# Patient Record
Sex: Male | Born: 1955 | Race: White | Hispanic: No | Marital: Married | Smoking: Former smoker
Health system: Southern US, Community
[De-identification: ages and names within clinical notes are randomized; demographics above are authoritative.]

## PROBLEM LIST (undated history)

## (undated) DIAGNOSIS — E785 Hyperlipidemia, unspecified: Secondary | ICD-10-CM

## (undated) DIAGNOSIS — I444 Left anterior fascicular block: Secondary | ICD-10-CM

## (undated) DIAGNOSIS — I452 Bifascicular block: Secondary | ICD-10-CM

## (undated) DIAGNOSIS — H269 Unspecified cataract: Secondary | ICD-10-CM

## (undated) DIAGNOSIS — E669 Obesity, unspecified: Secondary | ICD-10-CM

## (undated) DIAGNOSIS — I453 Trifascicular block: Secondary | ICD-10-CM

## (undated) DIAGNOSIS — G8929 Other chronic pain: Secondary | ICD-10-CM

## (undated) DIAGNOSIS — Z860100 Personal history of colon polyps, unspecified: Secondary | ICD-10-CM

## (undated) DIAGNOSIS — Z87892 Personal history of anaphylaxis: Secondary | ICD-10-CM

## (undated) DIAGNOSIS — Z96649 Presence of unspecified artificial hip joint: Secondary | ICD-10-CM

## (undated) DIAGNOSIS — I10 Essential (primary) hypertension: Secondary | ICD-10-CM

## (undated) DIAGNOSIS — D369 Benign neoplasm, unspecified site: Secondary | ICD-10-CM

## (undated) DIAGNOSIS — K219 Gastro-esophageal reflux disease without esophagitis: Secondary | ICD-10-CM

## (undated) HISTORY — DX: Hyperlipidemia, unspecified: E78.5

## (undated) HISTORY — DX: Left anterior fascicular block: I44.4

## (undated) HISTORY — PX: HIP SURGERY: SHX245

## (undated) HISTORY — DX: Personal history of colonic polyps: Z86.010

## (undated) HISTORY — PX: HAND SURGERY: SHX662

## (undated) HISTORY — DX: Personal history of anaphylaxis: Z87.892

## (undated) HISTORY — DX: Obesity, unspecified: E66.9

## (undated) HISTORY — DX: Benign neoplasm, unspecified site: D36.9

## (undated) HISTORY — DX: Personal history of colon polyps, unspecified: Z86.0100

## (undated) HISTORY — DX: Essential (primary) hypertension: I10

## (undated) HISTORY — PX: APPENDECTOMY: SHX54

## (undated) HISTORY — PX: CERVICAL LAMINECTOMY: SHX94

## (undated) HISTORY — DX: Bifascicular block: I45.2

## (undated) HISTORY — DX: Other chronic pain: G89.29

## (undated) HISTORY — PX: COLONOSCOPY: SHX174

## (undated) HISTORY — PX: CERVICAL FUSION: SHX112

## (undated) HISTORY — DX: Unspecified cataract: H26.9

## (undated) HISTORY — DX: Gastro-esophageal reflux disease without esophagitis: K21.9

## (undated) HISTORY — DX: Trifascicular block: I45.3

---

## 1998-05-25 ENCOUNTER — Encounter: Payer: Self-pay | Admitting: Emergency Medicine

## 1998-05-25 ENCOUNTER — Emergency Department (HOSPITAL_COMMUNITY): Admission: EM | Admit: 1998-05-25 | Discharge: 1998-05-25 | Payer: Self-pay | Admitting: Emergency Medicine

## 2001-10-15 ENCOUNTER — Encounter: Payer: Self-pay | Admitting: Gastroenterology

## 2001-10-24 ENCOUNTER — Encounter: Payer: Self-pay | Admitting: Gastroenterology

## 2001-10-25 ENCOUNTER — Encounter: Payer: Self-pay | Admitting: Gastroenterology

## 2003-04-08 ENCOUNTER — Ambulatory Visit (HOSPITAL_COMMUNITY): Admission: RE | Admit: 2003-04-08 | Discharge: 2003-04-08 | Payer: Self-pay | Admitting: Specialist

## 2003-05-28 ENCOUNTER — Ambulatory Visit (HOSPITAL_COMMUNITY): Admission: RE | Admit: 2003-05-28 | Discharge: 2003-05-28 | Payer: Self-pay | Admitting: Specialist

## 2003-06-17 ENCOUNTER — Ambulatory Visit (HOSPITAL_BASED_OUTPATIENT_CLINIC_OR_DEPARTMENT_OTHER): Admission: RE | Admit: 2003-06-17 | Discharge: 2003-06-17 | Payer: Self-pay | Admitting: Internal Medicine

## 2003-11-04 ENCOUNTER — Encounter: Admission: RE | Admit: 2003-11-04 | Discharge: 2003-11-04 | Payer: Self-pay | Admitting: Specialist

## 2003-11-26 ENCOUNTER — Ambulatory Visit: Payer: Self-pay | Admitting: Internal Medicine

## 2004-01-13 ENCOUNTER — Ambulatory Visit: Payer: Self-pay | Admitting: Internal Medicine

## 2004-04-01 ENCOUNTER — Encounter: Admission: RE | Admit: 2004-04-01 | Discharge: 2004-04-01 | Payer: Self-pay | Admitting: Specialist

## 2004-04-13 ENCOUNTER — Ambulatory Visit: Payer: Self-pay | Admitting: Internal Medicine

## 2004-08-17 ENCOUNTER — Ambulatory Visit: Payer: Self-pay | Admitting: Internal Medicine

## 2004-09-17 ENCOUNTER — Ambulatory Visit: Payer: Self-pay | Admitting: Internal Medicine

## 2004-10-21 ENCOUNTER — Ambulatory Visit: Payer: Self-pay | Admitting: Internal Medicine

## 2005-01-18 ENCOUNTER — Ambulatory Visit: Payer: Self-pay | Admitting: Internal Medicine

## 2005-05-19 ENCOUNTER — Encounter: Admission: RE | Admit: 2005-05-19 | Discharge: 2005-05-19 | Payer: Self-pay | Admitting: Specialist

## 2005-06-16 ENCOUNTER — Encounter: Admission: RE | Admit: 2005-06-16 | Discharge: 2005-06-16 | Payer: Self-pay | Admitting: Specialist

## 2005-06-23 ENCOUNTER — Ambulatory Visit: Payer: Self-pay | Admitting: Internal Medicine

## 2005-09-22 ENCOUNTER — Ambulatory Visit: Payer: Self-pay | Admitting: Internal Medicine

## 2005-12-15 ENCOUNTER — Ambulatory Visit: Payer: Self-pay | Admitting: Internal Medicine

## 2006-02-10 ENCOUNTER — Ambulatory Visit: Payer: Self-pay | Admitting: Internal Medicine

## 2006-05-24 ENCOUNTER — Ambulatory Visit: Payer: Self-pay | Admitting: Internal Medicine

## 2006-05-24 LAB — CONVERTED CEMR LAB
ALT: 32 units/L (ref 0–40)
AST: 21 units/L (ref 0–37)
Albumin: 4 g/dL (ref 3.5–5.2)
Alkaline Phosphatase: 61 units/L (ref 39–117)
BUN: 13 mg/dL (ref 6–23)
Basophils Absolute: 0 10*3/uL (ref 0.0–0.1)
Basophils Relative: 0.4 % (ref 0.0–1.0)
Bilirubin, Direct: 0.1 mg/dL (ref 0.0–0.3)
CO2: 29 meq/L (ref 19–32)
Calcium: 9.1 mg/dL (ref 8.4–10.5)
Chloride: 102 meq/L (ref 96–112)
Cholesterol: 195 mg/dL (ref 0–200)
Creatinine, Ser: 0.7 mg/dL (ref 0.4–1.5)
Creatinine,U: 125.6 mg/dL
Direct LDL: 76.5 mg/dL
Eosinophils Absolute: 0.2 10*3/uL (ref 0.0–0.6)
Eosinophils Relative: 3.2 % (ref 0.0–5.0)
GFR calc Af Amer: 152 mL/min
GFR calc non Af Amer: 126 mL/min
Glucose, Bld: 171 mg/dL — ABNORMAL HIGH (ref 70–99)
HCT: 47.7 % (ref 39.0–52.0)
HDL: 24.1 mg/dL — ABNORMAL LOW (ref >39.0)
Hemoglobin: 16.4 g/dL (ref 13.0–17.0)
Hgb A1c MFr Bld: 8 % — ABNORMAL HIGH (ref 4.6–6.0)
Lymphocytes Relative: 33.9 % (ref 12.0–46.0)
MCHC: 34.5 g/dL (ref 30.0–36.0)
MCV: 85.5 fL (ref 78.0–100.0)
Microalb Creat Ratio: 5.6 mg/g (ref 0.0–30.0)
Microalb, Ur: 0.7 mg/dL (ref 0.0–1.9)
Monocytes Absolute: 0.7 10*3/uL (ref 0.2–0.7)
Monocytes Relative: 9.5 % (ref 3.0–11.0)
Neutro Abs: 4.3 10*3/uL (ref 1.4–7.7)
Neutrophils Relative %: 53 % (ref 43.0–77.0)
PSA: 0.17 ng/mL (ref 0.10–4.00)
Platelets: 190 10*3/uL (ref 150–400)
Potassium: 3.9 meq/L (ref 3.5–5.1)
RBC: 5.58 M/uL (ref 4.22–5.81)
RDW: 13.5 % (ref 11.5–14.6)
Sodium: 137 meq/L (ref 135–145)
TSH: 2.3 microintl units/mL (ref 0.35–5.50)
Total Bilirubin: 0.9 mg/dL (ref 0.3–1.2)
Total CHOL/HDL Ratio: 8.1
Total Protein: 7.1 g/dL (ref 6.0–8.3)
Triglycerides: 638 mg/dL (ref 0–149)
VLDL: 128 mg/dL — ABNORMAL HIGH (ref 0–40)
WBC: 7.8 10*3/uL (ref 4.5–10.5)

## 2006-05-31 ENCOUNTER — Ambulatory Visit: Payer: Self-pay | Admitting: Internal Medicine

## 2006-06-19 ENCOUNTER — Ambulatory Visit: Payer: Self-pay | Admitting: Gastroenterology

## 2006-07-03 ENCOUNTER — Encounter: Payer: Self-pay | Admitting: Internal Medicine

## 2006-07-03 ENCOUNTER — Ambulatory Visit: Payer: Self-pay | Admitting: Gastroenterology

## 2006-07-03 LAB — HM COLONOSCOPY

## 2006-07-24 ENCOUNTER — Encounter: Admission: RE | Admit: 2006-07-24 | Discharge: 2006-07-24 | Payer: Self-pay | Admitting: Specialist

## 2006-07-24 ENCOUNTER — Encounter: Payer: Self-pay | Admitting: Internal Medicine

## 2006-08-02 ENCOUNTER — Encounter: Payer: Self-pay | Admitting: Internal Medicine

## 2006-08-02 ENCOUNTER — Encounter (INDEPENDENT_AMBULATORY_CARE_PROVIDER_SITE_OTHER): Payer: Self-pay

## 2006-08-02 DIAGNOSIS — Z86010 Personal history of colonic polyps: Secondary | ICD-10-CM | POA: Insufficient documentation

## 2006-08-02 DIAGNOSIS — E785 Hyperlipidemia, unspecified: Secondary | ICD-10-CM | POA: Insufficient documentation

## 2006-08-02 DIAGNOSIS — I10 Essential (primary) hypertension: Secondary | ICD-10-CM | POA: Insufficient documentation

## 2006-08-02 DIAGNOSIS — E119 Type 2 diabetes mellitus without complications: Secondary | ICD-10-CM | POA: Insufficient documentation

## 2006-08-07 ENCOUNTER — Encounter: Admission: RE | Admit: 2006-08-07 | Discharge: 2006-08-07 | Payer: Self-pay | Admitting: Specialist

## 2006-08-24 ENCOUNTER — Ambulatory Visit: Payer: Self-pay | Admitting: Internal Medicine

## 2006-08-24 DIAGNOSIS — L723 Sebaceous cyst: Secondary | ICD-10-CM | POA: Insufficient documentation

## 2006-08-24 LAB — CONVERTED CEMR LAB: Hgb A1c MFr Bld: 7.2 % — ABNORMAL HIGH (ref 4.6–6.0)

## 2006-09-08 ENCOUNTER — Telehealth (INDEPENDENT_AMBULATORY_CARE_PROVIDER_SITE_OTHER): Payer: Self-pay

## 2006-09-12 ENCOUNTER — Ambulatory Visit: Payer: Self-pay | Admitting: Internal Medicine

## 2006-09-12 DIAGNOSIS — J329 Chronic sinusitis, unspecified: Secondary | ICD-10-CM | POA: Insufficient documentation

## 2006-09-13 ENCOUNTER — Telehealth: Payer: Self-pay | Admitting: Internal Medicine

## 2006-10-19 ENCOUNTER — Telehealth: Payer: Self-pay | Admitting: Internal Medicine

## 2006-11-24 ENCOUNTER — Ambulatory Visit: Payer: Self-pay | Admitting: Internal Medicine

## 2006-11-24 LAB — CONVERTED CEMR LAB: Hgb A1c MFr Bld: 6.7 % — ABNORMAL HIGH (ref 4.6–6.0)

## 2006-11-29 ENCOUNTER — Encounter: Admission: RE | Admit: 2006-11-29 | Discharge: 2006-11-29 | Payer: Self-pay | Admitting: Specialist

## 2007-03-08 ENCOUNTER — Encounter: Admission: RE | Admit: 2007-03-08 | Discharge: 2007-03-08 | Payer: Self-pay | Admitting: Specialist

## 2007-04-05 ENCOUNTER — Ambulatory Visit: Payer: Self-pay | Admitting: Internal Medicine

## 2007-06-26 ENCOUNTER — Ambulatory Visit: Payer: Self-pay | Admitting: Internal Medicine

## 2007-06-26 DIAGNOSIS — L03119 Cellulitis of unspecified part of limb: Secondary | ICD-10-CM

## 2007-06-26 DIAGNOSIS — L02419 Cutaneous abscess of limb, unspecified: Secondary | ICD-10-CM | POA: Insufficient documentation

## 2007-07-05 ENCOUNTER — Ambulatory Visit: Payer: Self-pay | Admitting: Internal Medicine

## 2007-07-05 LAB — CONVERTED CEMR LAB
ALT: 40 units/L (ref 0–53)
AST: 32 units/L (ref 0–37)
Albumin: 3.8 g/dL (ref 3.5–5.2)
Alkaline Phosphatase: 53 units/L (ref 39–117)
BUN: 17 mg/dL (ref 6–23)
Basophils Absolute: 0 10*3/uL (ref 0.0–0.1)
Basophils Relative: 0.3 % (ref 0.0–1.0)
Bilirubin, Direct: 0.1 mg/dL (ref 0.0–0.3)
CO2: 32 meq/L (ref 19–32)
Calcium: 9.3 mg/dL (ref 8.4–10.5)
Chloride: 101 meq/L (ref 96–112)
Cholesterol: 169 mg/dL (ref 0–200)
Creatinine, Ser: 0.7 mg/dL (ref 0.4–1.5)
Creatinine,U: 209.3 mg/dL
Direct LDL: 80.5 mg/dL
Eosinophils Absolute: 0.4 10*3/uL (ref 0.0–0.7)
Eosinophils Relative: 4.8 % (ref 0.0–5.0)
GFR calc Af Amer: 152 mL/min
GFR calc non Af Amer: 125 mL/min
Glucose, Bld: 156 mg/dL — ABNORMAL HIGH (ref 70–99)
Glucose, Urine, Semiquant: NEGATIVE
HCT: 47.5 % (ref 39.0–52.0)
HDL: 27.7 mg/dL — ABNORMAL LOW (ref >39.0)
Hemoglobin: 16.2 g/dL (ref 13.0–17.0)
Hgb A1c MFr Bld: 7.3 % — ABNORMAL HIGH (ref 4.6–6.0)
Lymphocytes Relative: 35 % (ref 12.0–46.0)
MCHC: 34.1 g/dL (ref 30.0–36.0)
MCV: 90.2 fL (ref 78.0–100.0)
Microalb Creat Ratio: 5.7 mg/g (ref 0.0–30.0)
Microalb, Ur: 1.2 mg/dL (ref 0.0–1.9)
Monocytes Absolute: 0.6 10*3/uL (ref 0.1–1.0)
Monocytes Relative: 8 % (ref 3.0–12.0)
Neutro Abs: 4.3 10*3/uL (ref 1.4–7.7)
Neutrophils Relative %: 51.9 % (ref 43.0–77.0)
Nitrite: NEGATIVE
PSA: 0.17 ng/mL (ref 0.10–4.00)
Platelets: 189 10*3/uL (ref 150–400)
Potassium: 4.4 meq/L (ref 3.5–5.1)
Protein, U semiquant: NEGATIVE
RBC: 5.27 M/uL (ref 4.22–5.81)
RDW: 12.6 % (ref 11.5–14.6)
Sodium: 141 meq/L (ref 135–145)
Specific Gravity, Urine: 1.015
TSH: 2.07 microintl units/mL (ref 0.35–5.50)
Total Bilirubin: 0.9 mg/dL (ref 0.3–1.2)
Total CHOL/HDL Ratio: 6.1
Total Protein: 6.9 g/dL (ref 6.0–8.3)
Triglycerides: 451 mg/dL (ref 0–149)
Urobilinogen, UA: 1
VLDL: 90 mg/dL — ABNORMAL HIGH (ref 0–40)
WBC Urine, dipstick: NEGATIVE
WBC: 8.1 10*3/uL (ref 4.5–10.5)
pH: 8.5

## 2007-07-12 ENCOUNTER — Encounter: Payer: Self-pay | Admitting: Internal Medicine

## 2007-07-13 ENCOUNTER — Ambulatory Visit: Payer: Self-pay | Admitting: Internal Medicine

## 2007-07-13 DIAGNOSIS — K219 Gastro-esophageal reflux disease without esophagitis: Secondary | ICD-10-CM | POA: Insufficient documentation

## 2007-07-16 ENCOUNTER — Telehealth: Payer: Self-pay | Admitting: Internal Medicine

## 2007-08-01 ENCOUNTER — Encounter: Payer: Self-pay | Admitting: Internal Medicine

## 2007-08-28 ENCOUNTER — Ambulatory Visit: Payer: Self-pay | Admitting: Internal Medicine

## 2007-08-28 DIAGNOSIS — K5289 Other specified noninfective gastroenteritis and colitis: Secondary | ICD-10-CM | POA: Insufficient documentation

## 2007-10-09 ENCOUNTER — Encounter: Payer: Self-pay | Admitting: Internal Medicine

## 2007-10-15 ENCOUNTER — Ambulatory Visit: Payer: Self-pay | Admitting: Internal Medicine

## 2007-10-15 DIAGNOSIS — M1990 Unspecified osteoarthritis, unspecified site: Secondary | ICD-10-CM | POA: Insufficient documentation

## 2007-10-15 LAB — CONVERTED CEMR LAB: Hgb A1c MFr Bld: 7.3 % — ABNORMAL HIGH (ref 4.6–6.0)

## 2007-11-20 ENCOUNTER — Inpatient Hospital Stay (HOSPITAL_COMMUNITY): Admission: RE | Admit: 2007-11-20 | Discharge: 2007-11-23 | Payer: Self-pay | Admitting: Specialist

## 2008-01-04 HISTORY — PX: TOTAL KNEE ARTHROPLASTY: SHX125

## 2008-01-15 ENCOUNTER — Ambulatory Visit: Payer: Self-pay | Admitting: Internal Medicine

## 2008-01-15 LAB — CONVERTED CEMR LAB
ALT: 44 units/L (ref 0–53)
AST: 43 units/L — ABNORMAL HIGH (ref 0–37)
Albumin: 3.9 g/dL (ref 3.5–5.2)
Alkaline Phosphatase: 66 units/L (ref 39–117)
Bilirubin, Direct: 0.1 mg/dL (ref 0.0–0.3)
Hgb A1c MFr Bld: 6.8 % — ABNORMAL HIGH (ref 4.6–6.0)
Total Bilirubin: 0.9 mg/dL (ref 0.3–1.2)
Total Protein: 7.5 g/dL (ref 6.0–8.3)

## 2008-04-16 ENCOUNTER — Ambulatory Visit: Payer: Self-pay | Admitting: Internal Medicine

## 2008-04-17 LAB — CONVERTED CEMR LAB: Hgb A1c MFr Bld: 7.6 % — ABNORMAL HIGH (ref 4.6–6.5)

## 2008-07-22 ENCOUNTER — Telehealth: Payer: Self-pay | Admitting: Internal Medicine

## 2008-07-25 ENCOUNTER — Ambulatory Visit: Payer: Self-pay | Admitting: Internal Medicine

## 2008-07-28 ENCOUNTER — Telehealth: Payer: Self-pay | Admitting: Internal Medicine

## 2008-07-28 LAB — CONVERTED CEMR LAB
ALT: 35 units/L (ref 0–53)
AST: 38 units/L — ABNORMAL HIGH (ref 0–37)
Albumin: 4.1 g/dL (ref 3.5–5.2)
Alkaline Phosphatase: 58 units/L (ref 39–117)
BUN: 19 mg/dL (ref 6–23)
Basophils Absolute: 0 10*3/uL (ref 0.0–0.1)
Basophils Relative: 0.4 % (ref 0.0–3.0)
Bilirubin, Direct: 0 mg/dL (ref 0.0–0.3)
CO2: 27 meq/L (ref 19–32)
Calcium: 9.5 mg/dL (ref 8.4–10.5)
Chloride: 102 meq/L (ref 96–112)
Cholesterol: 177 mg/dL (ref 0–200)
Creatinine, Ser: 0.9 mg/dL (ref 0.4–1.5)
Eosinophils Absolute: 0.2 10*3/uL (ref 0.0–0.7)
Eosinophils Relative: 2.1 % (ref 0.0–5.0)
GFR calc non Af Amer: 93.15 mL/min (ref >60)
Glucose, Bld: 204 mg/dL — ABNORMAL HIGH (ref 70–99)
HCT: 48.5 % (ref 39.0–52.0)
Hemoglobin: 16.6 g/dL (ref 13.0–17.0)
Hgb A1c MFr Bld: 7.6 % — ABNORMAL HIGH (ref 4.6–6.5)
Lymphocytes Relative: 35.6 % (ref 12.0–46.0)
Lymphs Abs: 3.1 10*3/uL (ref 0.7–4.0)
MCHC: 34.2 g/dL (ref 30.0–36.0)
MCV: 87.4 fL (ref 78.0–100.0)
Monocytes Absolute: 0.6 10*3/uL (ref 0.1–1.0)
Monocytes Relative: 7 % (ref 3.0–12.0)
Neutro Abs: 4.7 10*3/uL (ref 1.4–7.7)
Neutrophils Relative %: 54.9 % (ref 43.0–77.0)
Platelets: 197 10*3/uL (ref 150.0–400.0)
Potassium: 4.1 meq/L (ref 3.5–5.1)
RBC: 5.55 M/uL (ref 4.22–5.81)
RDW: 13 % (ref 11.5–14.6)
Sodium: 138 meq/L (ref 135–145)
TSH: 2.11 microintl units/mL (ref 0.35–5.50)
Total Bilirubin: 1.2 mg/dL (ref 0.3–1.2)
Total Protein: 7.8 g/dL (ref 6.0–8.3)
WBC: 8.6 10*3/uL (ref 4.5–10.5)

## 2008-08-15 ENCOUNTER — Encounter: Admission: RE | Admit: 2008-08-15 | Discharge: 2008-08-15 | Payer: Self-pay | Admitting: Specialist

## 2008-08-16 ENCOUNTER — Encounter: Admission: RE | Admit: 2008-08-16 | Discharge: 2008-08-16 | Payer: Self-pay | Admitting: Specialist

## 2008-09-10 ENCOUNTER — Ambulatory Visit: Payer: Self-pay | Admitting: Internal Medicine

## 2008-09-10 LAB — CONVERTED CEMR LAB
BUN: 19 mg/dL (ref 6–23)
Basophils Absolute: 0.1 10*3/uL (ref 0.0–0.1)
Basophils Relative: 0.5 % (ref 0.0–3.0)
CO2: 27 meq/L (ref 19–32)
Calcium: 10.5 mg/dL (ref 8.4–10.5)
Chloride: 104 meq/L (ref 96–112)
Creatinine, Ser: 0.9 mg/dL (ref 0.4–1.5)
Eosinophils Absolute: 0.2 10*3/uL (ref 0.0–0.7)
Eosinophils Relative: 1.4 % (ref 0.0–5.0)
GFR calc non Af Amer: 93.1 mL/min (ref >60)
Glucose, Bld: 173 mg/dL — ABNORMAL HIGH (ref 70–99)
HCT: 50.9 % (ref 39.0–52.0)
Hemoglobin: 17.4 g/dL — ABNORMAL HIGH (ref 13.0–17.0)
Lymphocytes Relative: 32 % (ref 12.0–46.0)
Lymphs Abs: 3.5 10*3/uL (ref 0.7–4.0)
MCHC: 34.3 g/dL (ref 30.0–36.0)
MCV: 88.3 fL (ref 78.0–100.0)
Monocytes Absolute: 0.7 10*3/uL (ref 0.1–1.0)
Monocytes Relative: 6.4 % (ref 3.0–12.0)
Neutro Abs: 6.5 10*3/uL (ref 1.4–7.7)
Neutrophils Relative %: 59.7 % (ref 43.0–77.0)
Platelets: 203 10*3/uL (ref 150.0–400.0)
Potassium: 5.1 meq/L (ref 3.5–5.1)
RBC: 5.76 M/uL (ref 4.22–5.81)
RDW: 13.6 % (ref 11.5–14.6)
Sodium: 141 meq/L (ref 135–145)
WBC: 11 10*3/uL — ABNORMAL HIGH (ref 4.5–10.5)

## 2008-10-17 ENCOUNTER — Telehealth: Payer: Self-pay | Admitting: Internal Medicine

## 2008-11-24 ENCOUNTER — Telehealth: Payer: Self-pay | Admitting: Internal Medicine

## 2008-12-11 ENCOUNTER — Ambulatory Visit: Payer: Self-pay | Admitting: Internal Medicine

## 2008-12-16 LAB — CONVERTED CEMR LAB: Hgb A1c MFr Bld: 7.7 % — ABNORMAL HIGH (ref 4.6–6.5)

## 2009-02-06 ENCOUNTER — Telehealth: Payer: Self-pay | Admitting: Internal Medicine

## 2009-05-05 ENCOUNTER — Ambulatory Visit: Payer: Self-pay | Admitting: Internal Medicine

## 2009-05-05 LAB — CONVERTED CEMR LAB: Hgb A1c MFr Bld: 7.3 % — ABNORMAL HIGH (ref 4.6–6.5)

## 2009-06-26 ENCOUNTER — Telehealth: Payer: Self-pay | Admitting: Internal Medicine

## 2009-08-05 ENCOUNTER — Telehealth: Payer: Self-pay | Admitting: Internal Medicine

## 2009-08-13 ENCOUNTER — Ambulatory Visit: Payer: Self-pay | Admitting: Internal Medicine

## 2009-08-13 LAB — CONVERTED CEMR LAB
ALT: 31 units/L (ref 0–53)
AST: 26 units/L (ref 0–37)
Albumin: 4.1 g/dL (ref 3.5–5.2)
Alkaline Phosphatase: 52 units/L (ref 39–117)
BUN: 22 mg/dL (ref 6–23)
Basophils Absolute: 0 10*3/uL (ref 0.0–0.1)
Basophils Relative: 0.5 % (ref 0.0–3.0)
Bilirubin Urine: NEGATIVE
Bilirubin, Direct: 0.2 mg/dL (ref 0.0–0.3)
CO2: 28 meq/L (ref 19–32)
Calcium: 9.3 mg/dL (ref 8.4–10.5)
Chloride: 101 meq/L (ref 96–112)
Cholesterol: 203 mg/dL — ABNORMAL HIGH (ref 0–200)
Creatinine, Ser: 0.7 mg/dL (ref 0.4–1.5)
Creatinine,U: 149.7 mg/dL
Direct LDL: 104 mg/dL
Eosinophils Absolute: 0.3 10*3/uL (ref 0.0–0.7)
Eosinophils Relative: 3.1 % (ref 0.0–5.0)
GFR calc non Af Amer: 126.09 mL/min (ref >60)
Glucose, Bld: 146 mg/dL — ABNORMAL HIGH (ref 70–99)
Glucose, Urine, Semiquant: NEGATIVE
HCT: 46.4 % (ref 39.0–52.0)
HDL: 31.5 mg/dL — ABNORMAL LOW (ref >39.00)
Hemoglobin: 15.8 g/dL (ref 13.0–17.0)
Hgb A1c MFr Bld: 7.4 % — ABNORMAL HIGH (ref 4.6–6.5)
Ketones, urine, test strip: NEGATIVE
Lymphocytes Relative: 37 % (ref 12.0–46.0)
Lymphs Abs: 3.1 10*3/uL (ref 0.7–4.0)
MCHC: 34.1 g/dL (ref 30.0–36.0)
MCV: 90 fL (ref 78.0–100.0)
Microalb Creat Ratio: 0.5 mg/g (ref 0.0–30.0)
Microalb, Ur: 0.8 mg/dL (ref 0.0–1.9)
Monocytes Absolute: 0.7 10*3/uL (ref 0.1–1.0)
Monocytes Relative: 8.1 % (ref 3.0–12.0)
Neutro Abs: 4.3 10*3/uL (ref 1.4–7.7)
Neutrophils Relative %: 51.3 % (ref 43.0–77.0)
Nitrite: NEGATIVE
PSA: 0.18 ng/mL (ref 0.10–4.00)
Platelets: 179 10*3/uL (ref 150.0–400.0)
Potassium: 4.3 meq/L (ref 3.5–5.1)
Protein, U semiquant: NEGATIVE
RBC: 5.16 M/uL (ref 4.22–5.81)
RDW: 13.8 % (ref 11.5–14.6)
Sodium: 141 meq/L (ref 135–145)
Specific Gravity, Urine: 1.015
TSH: 1.47 microintl units/mL (ref 0.35–5.50)
Total Bilirubin: 0.7 mg/dL (ref 0.3–1.2)
Total CHOL/HDL Ratio: 6
Total Protein: 7 g/dL (ref 6.0–8.3)
Triglycerides: 485 mg/dL — ABNORMAL HIGH (ref 0.0–149.0)
Urobilinogen, UA: 0.2
VLDL: 97 mg/dL — ABNORMAL HIGH (ref 0.0–40.0)
WBC Urine, dipstick: NEGATIVE
WBC: 8.5 10*3/uL (ref 4.5–10.5)
pH: 7

## 2009-08-20 ENCOUNTER — Ambulatory Visit: Payer: Self-pay | Admitting: Internal Medicine

## 2009-08-20 LAB — HM DIABETES EYE EXAM: HM Diabetic Eye Exam: NORMAL

## 2009-10-26 ENCOUNTER — Telehealth: Payer: Self-pay | Admitting: Internal Medicine

## 2009-11-17 ENCOUNTER — Ambulatory Visit: Payer: Self-pay | Admitting: Internal Medicine

## 2009-11-17 LAB — CONVERTED CEMR LAB
Blood Glucose, Fingerstick: 256
Hgb A1c MFr Bld: 7.8 % — ABNORMAL HIGH (ref 4.6–6.5)

## 2009-11-17 LAB — HM DIABETES FOOT EXAM

## 2009-12-29 ENCOUNTER — Encounter (INDEPENDENT_AMBULATORY_CARE_PROVIDER_SITE_OTHER): Payer: Self-pay | Admitting: Specialist

## 2009-12-29 ENCOUNTER — Inpatient Hospital Stay (HOSPITAL_COMMUNITY)
Admission: RE | Admit: 2009-12-29 | Discharge: 2010-01-02 | Payer: Self-pay | Source: Home / Self Care | Attending: Specialist | Admitting: Specialist

## 2009-12-29 HISTORY — PX: HIP SURGERY: SHX245

## 2010-01-12 ENCOUNTER — Telehealth: Payer: Self-pay | Admitting: Internal Medicine

## 2010-02-02 NOTE — Procedures (Signed)
Summary: Colonoscopy Report/[facility]  Colonoscopy Report/[facility]   Imported By: Maryln Gottron 05/27/2009 14:16:34  _____________________________________________________________________  External Attachment:    Type:   Image     Comment:   External Document

## 2010-02-02 NOTE — Progress Notes (Signed)
  Phone Note Call from Patient   Caller: Patient Call For: Gordy Savers  MD Summary of Call: needs written rx for methadone 10mg  2-3 once daily - will pick up    [phone] Initial call taken by: Raechel Ache, RN,  February 06, 2009 11:48 AM    Prescriptions: METHADONE HCL 10 MG  TABS (METHADONE HCL) 1-4 by mouth daily  #90 x 0   Entered and Authorized by:   Gordy Savers  MD   Signed by:   Gordy Savers  MD on 02/06/2009   Method used:   Print then Give to Patient   RxID:   [id number]   Appended Document:  pt notified rx ready for p/u  KIK

## 2010-02-02 NOTE — Progress Notes (Signed)
Summary: methadone  Phone Note Call from Patient Call back at Work Phone [phone]   Caller: vm Summary of Call: Methadone Rx.  Will be out by Sat.  Will come by office to pickup.  Please call.   Initial call taken by: Rudy Jew, RN,  August 05, 2009 5:09 PM  Follow-up for Phone Call        pt aware - rx ready for pick up   KIK Follow-up by: Duard Brady LPN,  August 06, 2009 8:11 AM    Prescriptions: METHADONE HCL 10 MG  TABS (METHADONE HCL) 1-4 by mouth daily  #90 x 0   Entered by:   Gordy Savers  MD   Authorized by:   Duard Brady LPN   Signed by:   Gordy Savers  MD on 08/06/2009   Method used:   Print then Give to Patient   RxID:   [id number]   Appended Document: methadone [facility] called - needed ok - rx was prepared wrong. KIK

## 2010-02-02 NOTE — Progress Notes (Signed)
Summary: refill methadone  Phone Note Call from Patient Call back at Work Phone [phone]   Reason for Call: Refill Medication Summary of Call: Methadone written Rx.   Initial call taken by: Rudy Jew, RN,  October 26, 2009 11:14 AM  Follow-up for Phone Call        Phone Call Completed Follow-up by: Rudy Jew, RN,  October 26, 2009 3:35 PM    Prescriptions: METHADONE HCL 10 MG  TABS (METHADONE HCL) 1-4 by mouth daily  #90 x 0   Entered and Authorized by:   Gordy Savers  MD   Signed by:   Gordy Savers  MD on 10/26/2009   Method used:   Print then Give to Patient   RxID:   [id number]

## 2010-02-02 NOTE — Assessment & Plan Note (Signed)
Summary: 3 month follow up/cjr/pt rsc/cjr   Vital Signs:  Patient profile:   55 year old male Weight:      265 pounds Temp:     98.0 degrees F oral BP sitting:   120 / 80  (left arm) Cuff size:   large CC: 3 month F/U pt doing well  refills  BS-138 Is Patient Diabetic? Yes   CC:  3 month F/U pt doing well  refills  BS-138.  History of Present Illness: 55 year old patient who is in for follow up of his type 2 diabetes.  He states he has had 3 care at a recent cortisone injections to the right shoulder, one in the right hip.  He also states he has been off his Actos for about one month.  There's been a modest amount of weight loss over the past 4 months.  He has dyslipidemia and treated hypertension.  He has advanced osteoarthritis and low back pain.  He is followed by orthopedics closely.  Remains on Lipitor, which he continues to tolerate well.  Denies any muscle pain or weakness.  He also is a history of colonic polyps.  He's had a recent bout of viral dysentery that has resolved  Preventive Screening-Counseling & Management  Alcohol-Tobacco     Smoking Status: quit   Allergies: 1)  ! Codeine 2)  Aspirin (Aspirin) 3)  Naprosyn (Naproxen)  Past History:  Past Medical History: Reviewed history from 10/29/2008 and no changes required. 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 Foot surgery Appendectomy Cervical laminectomy x 2 Cervical fusion Hip surgery bilateral colonoscopy  2009 status post left total knee replacement surgery January 2010 stress test 2005  Family History: Reviewed history from 08/28/2007 and no changes required. 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  The patient denies anorexia, fever, weight loss, weight gain, vision loss, decreased hearing, hoarseness, chest pain, syncope, dyspnea on exertion, peripheral edema, prolonged cough, headaches, hemoptysis, abdominal pain, melena, hematochezia, severe indigestion/heartburn, hematuria, incontinence, genital sores, muscle weakness, suspicious skin lesions, transient blindness, depression, unusual weight change, abnormal bleeding, enlarged lymph nodes, angioedema, breast masses, and testicular masses.    Physical Exam  General:  overweight-appearing.  122/82overweight-appearing.   Head:  Normocephalic and atraumatic without obvious abnormalities. No apparent alopecia or balding. Eyes:  No corneal or conjunctival inflammation noted. EOMI. Perrla. Funduscopic exam benign, without hemorrhages, exudates or papilledema. Vision grossly normal. Mouth:  Oral mucosa and oropharynx without lesions or exudates.  Teeth in good repair. Neck:  No deformities, masses, or tenderness noted. Lungs:  Normal respiratory effort, chest expands symmetrically. Lungs are clear to auscultation, no crackles or wheezes. Heart:  Normal rate and regular rhythm. S1 and S2 normal without gallop, murmur, click, rub or other extra sounds. Abdomen:  obese soft and nontender Msk:  No deformity or scoliosis noted of thoracic or lumbar spine.   Pulses:  R and L carotid,radial,femoral,dorsalis pedis and posterior tibial pulses are full and equal bilaterally Extremities:  No  clubbing, cyanosis, edema, or deformity noted with normal full range of motion of all joints.   Skin:  Intact without suspicious lesions or rashes Cervical  Nodes:  No lymphadenopathy noted   Impression & Recommendations:  Problem # 1:  DEGENERATIVE JOINT DISEASE (ICD-715.90)  His updated medication list for this problem includes:    Methadone Hcl 10 Mg Tabs (Methadone hcl) .Marland Kitchen... 1-4 by mouth daily  His updated medication list for this problem includes:    Methadone Hcl 10 Mg Tabs (Methadone hcl) .Marland Kitchen... 1-4 by mouth daily  Problem # 2:  HYPERTENSION (ICD-401.9)  His updated medication list for this problem includes:    Lisinopril-hydrochlorothiazide 20-25 Mg Tabs (Lisinopril-hydrochlorothiazide) .Marland Kitchen... 1 once daily  His updated medication list for this problem includes:    Lisinopril-hydrochlorothiazide 20-25 Mg Tabs (Lisinopril-hydrochlorothiazide) .Marland Kitchen... 1 once daily  Problem # 3:  HYPERLIPIDEMIA (ICD-272.4)  His updated medication list for this problem includes:    Lipitor 40 Mg Tabs (Atorvastatin calcium) ..... One by mouth daily  His updated medication list for this problem includes:    Lipitor 40 Mg Tabs (Atorvastatin calcium) ..... One by mouth daily  Problem # 4:  DIABETES MELLITUS, TYPE II (ICD-250.00)  His updated medication list for this problem includes:    Lisinopril-hydrochlorothiazide 20-25 Mg Tabs (Lisinopril-hydrochlorothiazide) .Marland Kitchen... 1 once daily    Metformin Hcl 1000 Mg Tabs (Metformin hcl) ..... One bid    Actos 15 Mg Tabs (Pioglitazone hcl) .Marland Kitchen... 1 once daily  His updated medication list for this problem includes:    Lisinopril-hydrochlorothiazide 20-25 Mg Tabs (Lisinopril-hydrochlorothiazide) .Marland Kitchen... 1 once daily    Metformin Hcl 1000 Mg Tabs (Metformin hcl) ..... One bid    Actos 15 Mg Tabs (Pioglitazone hcl) .Marland Kitchen... 1 once daily  Orders: Venipuncture (13086) TLB-A1C / Hgb A1C (Glycohemoglobin) (83036-A1C)  Complete Medication List: 1)  Lipitor 40 Mg Tabs (Atorvastatin calcium) .... One by mouth daily 2)  Lisinopril-hydrochlorothiazide 20-25 Mg Tabs (Lisinopril-hydrochlorothiazide) .Marland Kitchen.. 1 once daily 3)   Metformin Hcl 1000 Mg Tabs (Metformin hcl) .... One bid 4)  Methocarbamol 750 Mg Tabs (Methocarbamol) .Marland Kitchen.. 1 q6h as needed 5)  Prilosec 20 Mg Cpdr (Omeprazole) .Marland Kitchen.. 1 once daily 6)  Methadone Hcl 10 Mg Tabs (Methadone hcl) .Marland Kitchen.. 1-4 by mouth daily 7)  Glimepiride 4 Mg Tabs (glimepiride)  .Marland Kitchen.. 1 once daily as needed 8)  Mupirocin 2 % Oint (Mupirocin) .... Uad 9)  Actos 15 Mg Tabs (Pioglitazone hcl) .Marland Kitchen.. 1 once daily 10)  Doxycycline Hyclate 100 Mg Tabs (Doxycycline hyclate) .... One twice daily 11)  Promethazine Hcl 25 Mg Tabs (Promethazine hcl) .... One every 6 hours as needed nausea  Patient Instructions: 1)  Please schedule a follow-up appointment in 3 months CPX 2)  Limit your Sodium (Salt). 3)  It is important that you exercise regularly at least 20 minutes 5 times a week. If you develop chest pain, have severe difficulty breathing, or feel very tired , stop exercising immediately and seek medical attention. 4)  You need to lose weight. Consider a lower calorie diet and regular exercise.  5)  Check your blood sugars regularly. If your readings are usually above : or below 70 you should contact our office. 6)  It is important that your Diabetic A1c level is checked every 3 months. 7)  See your eye doctor yearly to check for diabetic eye damage. Prescriptions: PROMETHAZINE HCL 25 MG TABS (PROMETHAZINE HCL) One every 6 hours as needed nausea  #30 x 0   Entered and  Authorized by:   Gordy Savers  MD   Signed by:   Gordy Savers  MD on 05/05/2009   Method used:   Print then Give to Patient   RxID:   [id number] ACTOS 15 MG TABS (PIOGLITAZONE HCL) 1 once daily  #90 x 6   Entered and Authorized by:   Gordy Savers  MD   Signed by:   Gordy Savers  MD on 05/05/2009   Method used:   Print then Give to Patient   RxID:   [id number] GLIMEPIRIDE 4 MG  TABS (GLIMEPIRIDE) 1 once daily as needed  #90 x 6   Entered and Authorized by:   Gordy Savers  MD    Signed by:   Gordy Savers  MD on 05/05/2009   Method used:   Print then Give to Patient   RxID:   [id number] METHADONE HCL 10 MG  TABS (METHADONE HCL) 1-4 by mouth daily  #90 x 0   Entered and Authorized by:   Gordy Savers  MD   Signed by:   Gordy Savers  MD on 05/05/2009   Method used:   Print then Give to Patient   RxID:   [id number] PRILOSEC 20 MG  CPDR (OMEPRAZOLE) 1 once daily  #90 x 6   Entered and Authorized by:   Gordy Savers  MD   Signed by:   Gordy Savers  MD on 05/05/2009   Method used:   Print then Give to Patient   RxID:   [id number] METHOCARBAMOL 750 MG  TABS (METHOCARBAMOL) 1 q6h as needed  #90 Tablet x 6   Entered and Authorized by:   Gordy Savers  MD   Signed by:   Gordy Savers  MD on 05/05/2009   Method used:   Print then Give to Patient   RxID:   [id number] METFORMIN HCL 1000 MG  TABS (METFORMIN HCL) one bid  #180 x 6   Entered and Authorized by:   Gordy Savers  MD   Signed by:   Gordy Savers  MD on 05/05/2009   Method used:   Print then Give to Patient   RxID:   [id number] LISINOPRIL-HYDROCHLOROTHIAZIDE 20-25 MG  TABS (LISINOPRIL-HYDROCHLOROTHIAZIDE) 1 once daily  #90 x 6   Entered and Authorized by:   Gordy Savers  MD   Signed by:   Gordy Savers  MD on 05/05/2009   Method used:   Print then Give to Patient   RxID:   [id number] LIPITOR 40 MG  TABS (ATORVASTATIN CALCIUM) one by mouth daily  #90 x 6   Entered and Authorized by:   Gordy Savers  MD   Signed by:   Gordy Savers  MD on 05/05/2009   Method used:   Print then Give to Patient   RxID:   [id number]

## 2010-02-02 NOTE — Assessment & Plan Note (Signed)
Summary: cpx/njrq   Vital Signs:  Patient profile:   55 year old male Height:      73 inches Weight:      267 pounds Temp:     98.2 degrees F oral BP sitting:   120 / 80  (left arm) Cuff size:   large  Vitals Entered By: Kathrynn Speed CMA (August 20, 2009 2:50 PM) CC: cpx, review lab, skin spots, spot on ear will not heal,left toenail ,src     bs after lunch 130 Is Patient Diabetic? Yes   CC:  cpx, review lab, skin spots, spot on ear will not heal, left toenail , and src     bs after lunch 130.  History of Present Illness: 55 year old patient who is seen today for a health maintenance examination.  Medical problems include osteoarthritis, and chronic pain.  He remains on methadone, which he takes one to 3 times daily.  He has type 2 diabetes, hypertension, and dyslipidemia.  Preventive Screening-Counseling & Management  Alcohol-Tobacco     Smoking Status: quit  Current Medications (verified): 1)  Lipitor 40 Mg  Tabs (Atorvastatin Calcium) .... One By Mouth Daily 2)  Lisinopril-Hydrochlorothiazide 20-25 Mg  Tabs (Lisinopril-Hydrochlorothiazide) .Marland Kitchen.. 1 Once Daily 3)  Metformin Hcl 1000 Mg  Tabs (Metformin Hcl) .... One Bid 4)  Methocarbamol 750 Mg  Tabs (Methocarbamol) .Marland Kitchen.. 1 Q6h As Needed 5)  Prilosec 20 Mg  Cpdr (Omeprazole) .Marland Kitchen.. 1 Once Daily 6)  Methadone Hcl 10 Mg  Tabs (Methadone Hcl) .Marland Kitchen.. 1-4 By Mouth Daily 7)  Glimepiride 4 Mg  Tabs (Glimepiride) .Marland Kitchen.. 1 Once Daily As Needed 8)  Mupirocin 2 %  Oint (Mupirocin) .... Uad 9)  Actos 15 Mg Tabs (Pioglitazone Hcl) .Marland Kitchen.. 1 Once Daily 10)  Promethazine Hcl 25 Mg Tabs (Promethazine Hcl) .... One Every 6 Hours As Needed Nausea  Allergies (verified): 1)  ! Codeine 2)  Aspirin (Aspirin) 3)  Naprosyn (Naproxen)  Past History:  Past Medical History: Reviewed history from 10/29/2008 and no changes required. Diabetes mellitus, type II Hyperlipidemia Hypertension Chronic pain Obesity Colonic polyps, hx of GERD trifascicular  block with first degree AV block, left anterior hemiblock, and right bundle branch block Adenomatous polyp 10/2001  Past Surgical History: Reviewed history from 09/10/2008 and no changes required. Foot surgery Appendectomy Cervical laminectomy x 2 Cervical fusion Hip surgery bilateral colonoscopy  2009 status post left total knee replacement surgery January 2010 stress test 2005  Family History: Reviewed history from 08/28/2007 and no changes required. 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    Physical Exam  General:  overweight-appearing.  120/80 Head:  Normocephalic and atraumatic without obvious abnormalities. No apparent alopecia or balding. Eyes:  No corneal or conjunctival inflammation noted. EOMI. Perrla. Funduscopic exam benign, without hemorrhages, exudates or papilledema. Vision grossly normal. Ears:  External ear exam shows no significant lesions or deformities.  Otoscopic examination reveals clear canals, tympanic membranes  are intact bilaterally without bulging, retraction, inflammation or discharge. Hearing is grossly normal bilaterally. Nose:  External nasal examination shows no  deformity or inflammation. Nasal mucosa are pink and moist without lesions or exudates. Mouth:  Oral mucosa and oropharynx without lesions or exudates.  Teeth in good repair. Neck:  No deformities, masses, or tenderness noted. Chest Wall:  No deformities, masses, tenderness or gynecomastia noted. Breasts:  No masses or gynecomastia noted Lungs:  Normal respiratory effort, chest expands symmetrically. Lungs are clear to auscultation, no crackles or wheezes. Heart:  Normal rate and regular rhythm. S1 and S2 normal without gallop, murmur, click, rub or other extra sounds. Abdomen:  Bowel sounds positive,abdomen soft and non-tender without masses, organomegaly or hernias noted. Rectal:  No external abnormalities noted. Normal sphincter tone. No rectal masses or tenderness. Genitalia:  Testes bilaterally descended without nodularity, tenderness or masses. No scrotal masses or lesions. No penis lesions or urethral discharge. Prostate:  1+ enlarged.   Msk:  No deformity or scoliosis noted of thoracic or lumbar spine.   Pulses:  R and L carotid,radial,femoral,dorsalis pedis and posterior tibial pulses are full and equal bilaterally Extremities:  No clubbing, cyanosis, edema, or deformity noted with normal full range of motion of all joints.   Neurologic:  No cranial nerve deficits noted. Station and gait are normal. Plantar reflexes are down-going bilaterally. DTRs are symmetrical throughout. Sensory, motor and coordinative functions appear intact.  Diabetes Management Exam:    Foot Exam (with socks and/or shoes not present):       Sensory-Pinprick/Light touch:          Left medial foot (L-4): normal          Left dorsal foot (L-5): normal          Left lateral foot (S-1): normal          Right medial foot (L-4): normal          Right dorsal foot (L-5): normal          Right lateral foot (S-1): normal       Sensory-Monofilament:          Left foot: diminished          Right foot: diminished        Inspection:          Left foot: normal          Right foot: normal       Nails:          Left foot: normal          Right foot: normal    Foot Exam by Podiatrist:       Date: 08/20/2009       Results: early diabetic findings       Done by: PCP    Eye Exam:       Eye Exam done here today          Results: normal   Impression & Recommendations:  Problem # 1:  PHYSICAL EXAMINATION (ICD-V70.0)  Complete Medication List: 1)  Lipitor 40 Mg Tabs (Atorvastatin calcium) .... One by mouth daily 2)  Lisinopril-hydrochlorothiazide 20-25 Mg Tabs (Lisinopril-hydrochlorothiazide) .Marland Kitchen.. 1 once daily 3)  Metformin Hcl 1000 Mg Tabs (Metformin hcl) .... One bid 4)  Methocarbamol 750 Mg Tabs (Methocarbamol) .Marland Kitchen.. 1 q6h as needed 5)  Prilosec 20 Mg Cpdr (Omeprazole) .Marland Kitchen.. 1 once daily 6)  Methadone Hcl 10 Mg Tabs (Methadone hcl) .Marland Kitchen.. 1-4 by mouth daily 7)  Glimepiride  4 Mg Tabs (glimepiride)  .Marland Kitchen.. 1 once daily as needed 8)  Mupirocin 2 % Oint (Mupirocin) .... Uad 9)  Actos 15 Mg Tabs (Pioglitazone hcl) .Marland Kitchen.. 1 once daily 10)  Promethazine Hcl 25 Mg Tabs (Promethazine hcl) .... One every 6 hours as needed nausea  Patient Instructions: 1)  Please schedule a follow-up appointment in 3 months. 2)  Limit your Sodium (Salt) to less than 2 grams a day(slightly less than 1/2 a teaspoon) to prevent fluid retention, swelling, or worsening of symptoms. 3)  It is important that you exercise regularly at least 20 minutes 5 times a week. If you develop chest pain, have severe difficulty breathing, or feel very tired , stop exercising immediately and seek medical attention. 4)  You need to lose weight. Consider a lower calorie diet and regular exercise.  5)  Check your blood sugars regularly. If your readings are usually above : or below 70 you should contact our office. 6)  It is important that your Diabetic A1c level is checked every 3 months. 7)  See your eye doctor yearly to check for diabetic eye damage.

## 2010-02-02 NOTE — Progress Notes (Signed)
Summary: REQ FOR REFILL RX  Phone Note Refill Request Message from:  Patient on June 26, 2009 8:24 AM  Refills Requested: Medication #1:  METFORMIN HCL 1000 MG  TABS one bid   Notes: [facility].  Medication #2:  ACTOS 15 MG TABS 1 once daily   Notes: [facility].  Patient adv that he is currently out of medication and would like to have Rx sent to [facility].Marland KitchenMarland KitchenMarland KitchenHe states that he looked on-line to see if Rx had been received by [facility] but it was showing that nothing has been received...Marland KitchenMarland KitchenMarland Kitchen Therefore, pt will need a Rx for at least 30 days sent to a local pharmacy - [facility] [place].      Prescriptions: METFORMIN HCL 1000 MG  TABS (METFORMIN HCL) one bid  #60 x 0   Entered by:   Kathrynn Speed CMA   Authorized by:   Gordy Savers  MD   Signed by:   Kathrynn Speed CMA on 06/26/2009   Method used:   Electronically to        [facility]  [place] [address] (retail[facility]       [address]       [place], [place]  [address]       Ph: [phone] or [phone]       Fax: [phone]   RxID:   [id number]

## 2010-02-02 NOTE — Assessment & Plan Note (Signed)
Summary: 3 mnth rov//slm   Vital Signs:  Patient profile:   55 year old male Weight:      270 pounds Temp:     98.0 degrees F oral BP sitting:   120 / 80  (right arm) Cuff size:   regular  Vitals Entered By: Duard Brady LPN (November 17, 2009 8:31 AM) CC: 3 mos rov - doing ok  Is Patient Diabetic? Yes Did you bring your meter with you today? No CBG Result 256 Flu Vaccine Consent Questions     Do you have a history of severe allergic reactions to this vaccine? no    Any prior history of allergic reactions to egg and/or gelatin? no    Do you have a sensitivity to the preservative Thimersol? no    Do you have a past history of Guillan-Barre Syndrome? no    Do you currently have an acute febrile illness? no    Have you ever had a severe reaction to latex? no    Vaccine information given and explained to patient? yes    Are you currently pregnant? no    Lot Number:[id number]   Exp Date:07/03/2010   Site Given  Left Deltoid IM   CC:  3 mos rov - doing ok .  History of Present Illness: 55 year old patient who is seen today for follow-up.  He has a history of type 2 diabetes  and has been on low-dose Actos.  he has had concerns due to the adverse publicity and wishes to discontinue Actos.  His last hemoglobin A1c was 7.4, and random sugar today 256.  He has hypertension and dyslipidemia.  There is been some weight gain since his last visit.  He has chronic pain.  He has requested a refill on his methadone.  Allergies: 1)  ! Codeine 2)  Aspirin (Aspirin) 3)  Naprosyn (Naproxen)  Past History:  Past Medical History: Reviewed history from 10/29/2008 and no changes required. 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 Foot surgery Appendectomy Cervical laminectomy x 2 Cervical fusion Hip surgery bilateral colonoscopy  2009 status post left total knee replacement surgery January 2010 stress test 2005  Family History: Reviewed history from 08/28/2007 and no changes required. father died age 70 history Crohn's disease, ischemic bowel, peripheral vascular occlusive disease  Mother history lung cancer  One brother- alcohol related problems  Review of Systems       The patient complains of weight gain.  The patient denies anorexia, fever, weight loss, vision loss, decreased hearing, hoarseness, chest pain, syncope, dyspnea on exertion, peripheral edema, prolonged cough, headaches, hemoptysis, abdominal pain, melena, hematochezia, severe indigestion/heartburn, hematuria, incontinence, genital sores, muscle weakness, suspicious skin lesions, transient blindness, difficulty walking, depression, unusual weight change, abnormal bleeding, enlarged lymph nodes, angioedema, breast masses, and testicular masses.    Physical Exam  General:  overweight-appearing.  normal blood pressureoverweight-appearing.   Head:  Normocephalic and atraumatic without obvious abnormalities. No apparent alopecia or balding. Eyes:  No corneal or conjunctival inflammation noted. EOMI. Perrla. Funduscopic exam benign, without hemorrhages, exudates or papilledema. Vision grossly normal. Mouth:  Oral mucosa and oropharynx without lesions or exudates.  Teeth in good repair. Neck:  No deformities, masses, or tenderness noted. Lungs:  Normal respiratory effort, chest expands symmetrically. Lungs are clear to  auscultation, no crackles or wheezes. Heart:  Normal rate and regular rhythm. S1 and S2 normal without gallop, murmur, click, rub or other extra sounds. Abdomen:  Bowel sounds positive,abdomen soft and non-tender without masses, organomegaly or hernias noted. Msk:  No deformity or scoliosis noted of thoracic or lumbar  spine.   Pulses:  R and L carotid,radial,femoral,dorsalis pedis and posterior tibial pulses are full and equal bilaterally Skin:  Intact without suspicious lesions or rashes Cervical Nodes:  No lymphadenopathy noted  Diabetes Management Exam:    Foot Exam (with socks and/or shoes not present):       Sensory-Pinprick/Light touch:          Left medial foot (L-4): normal          Left dorsal foot (L-5): normal          Left lateral foot (S-1): normal          Right medial foot (L-4): normal          Right dorsal foot (L-5): normal          Right lateral foot (S-1): normal       Sensory-Monofilament:          Left foot: normal          Right foot: normal       Inspection:          Left foot: normal          Right foot: normal       Nails:          Left foot: normal          Right foot: normal    Foot Exam by Podiatrist:       Date: 11/17/2009       Results: no diabetic findings       Done by: PCP   Impression & Recommendations:  Problem # 1:  DIABETES MELLITUS, TYPE II (ICD-250.00)  The following medications were removed from the medication list:    Actos 15 Mg Tabs (Pioglitazone hcl) .Marland Kitchen... 1 once daily His updated medication list for this problem includes:    Lisinopril-hydrochlorothiazide 20-25 Mg Tabs (Lisinopril-hydrochlorothiazide) .Marland Kitchen... 1 once daily    Metformin Hcl 1000 Mg Tabs (Metformin hcl) ..... One bid    Victoza 18 Mg/12ml Soln (Liraglutide) ..... Use daily  Orders: Capillary Blood Glucose/CBG (91478) Venipuncture (29562) TLB-A1C / Hgb A1C (Glycohemoglobin) (83036-A1C) Specimen Handling (13086)  The following medications were removed from the medication list:    Actos 15 Mg Tabs (Pioglitazone hcl) .Marland Kitchen... 1 once daily His updated medication list for this problem includes:    Lisinopril-hydrochlorothiazide 20-25 Mg Tabs (Lisinopril-hydrochlorothiazide) .Marland Kitchen... 1 once daily    Metformin Hcl 1000 Mg Tabs (Metformin hcl) ..... One bid    Victoza 18 Mg/70ml Soln  (Liraglutide) ..... Use daily  Problem # 2:  HYPERTENSION (ICD-401.9)  His updated medication list for this problem includes:    Lisinopril-hydrochlorothiazide 20-25 Mg Tabs (Lisinopril-hydrochlorothiazide) .Marland Kitchen... 1 once daily  His updated medication list for this problem includes:    Lisinopril-hydrochlorothiazide 20-25 Mg Tabs (Lisinopril-hydrochlorothiazide) .Marland Kitchen... 1 once daily  Problem # 3:  HYPERLIPIDEMIA (ICD-272.4)  His updated medication list for this problem includes:    Lipitor 40 Mg Tabs (Atorvastatin calcium) ..... One by mouth daily  His updated medication list for this problem includes:    Lipitor 40 Mg Tabs (Atorvastatin calcium) ..... One by mouth daily  Complete Medication List: 1)  Lipitor 40 Mg Tabs (Atorvastatin calcium) .Marland KitchenMarland KitchenMarland Kitchen  One by mouth daily 2)  Lisinopril-hydrochlorothiazide 20-25 Mg Tabs (Lisinopril-hydrochlorothiazide) .Marland Kitchen.. 1 once daily 3)  Metformin Hcl 1000 Mg Tabs (Metformin hcl) .... One bid 4)  Methocarbamol 750 Mg Tabs (Methocarbamol) .Marland Kitchen.. 1 q6h as needed 5)  Prilosec 20 Mg Cpdr (Omeprazole) .Marland Kitchen.. 1 once daily 6)  Methadone Hcl 10 Mg Tabs (Methadone hcl) .Marland Kitchen.. 1-4 by mouth daily 7)  Glimepiride 4 Mg Tabs (glimepiride)  .Marland Kitchen.. 1 once daily as needed 8)  Mupirocin 2 % Oint (Mupirocin) .... Uad 9)  Promethazine Hcl 25 Mg Tabs (Promethazine hcl) .... One every 6 hours as needed nausea 10)  Victoza 18 Mg/63ml Soln (Liraglutide) .... Use daily  Other Orders: Admin 1st Vaccine (91478) Flu Vaccine 51yrs + (29562)  Patient Instructions: 1)  Please schedule a follow-up appointment in 3 months. 2)  It is important that you exercise regularly at least 20 minutes 5 times a week. If you develop chest pain, have severe difficulty breathing, or feel very tired , stop exercising immediately and seek medical attention. 3)  You need to lose weight. Consider a lower calorie diet and regular exercise.  4)  Check your blood sugars regularly. If your readings are usually above  : or below 70 you should contact our office. 5)  It is important that your Diabetic A1c level is checked every 3 months. 6)  See your eye doctor yearly to check for diabetic eye damage. Prescriptions: METHADONE HCL 10 MG  TABS (METHADONE HCL) 1-4 by mouth daily  #90 x 0   Entered and Authorized by:   Gordy Savers  MD   Signed by:   Gordy Savers  MD on 11/17/2009   Method used:   Print then Give to Patient   RxID:   [id number] WUXLKGM 18 MG/3ML SOLN (LIRAGLUTIDE) use daily  #3 x 3   Entered and Authorized by:   Gordy Savers  MD   Signed by:   Gordy Savers  MD on 11/17/2009   Method used:   [facility]        [facility]  [place] [address] [facility]ail[facility]       [address]       [place], [place]  [address]       Ph: [phone] or [phone]       Fax: [phone]   RxID:   [id number]    Orders Added: 1)  Capillary Blood Glucose/CBG [82948] 2)  Admin 1st Vaccine [90471] 3)  Flu Vaccine 27yrs + [23557] 4)  Est. Patient Level IV [32202] 5)  Venipuncture [36415] 6)  TLB-A1C / Hgb A1C (Glycohemoglobin) [83036-A1C] 7)  Specimen Handling [99000]  Appended Document: 3 mnth rov//slm Medications Added VICTOZA 18 MG/3ML SOLN (LIRAGLUTIDE) inject 6mg  daily for 1 wk then 12mg  daily for 1 wk then 18mg  daily as directed NOVOTWIST 32G X 5 MM MISC (INSULIN PEN NEEDLE) as directed daily          Clinical Lists Changes  Medications: Changed medication from VICTOZA 18 MG/3ML SOLN (LIRAGLUTIDE) use daily to VICTOZA 18 MG/3ML SOLN (LIRAGLUTIDE) inject 6mg  daily for 1 wk then 12mg  daily for 1 wk then 18mg  daily as directed - Signed Added new medication of NOVOTWIST 32G X 5 MM MISC (INSULIN PEN NEEDLE) as directed daily - Signed Rx of VICTOZA 18 MG/3ML SOLN (LIRAGLUTIDE) inject 6mg  daily for 1 wk then 12mg  daily for 1 wk then 18mg  daily as directed;  #30days x 6;  Signed;  Entered by: Duard Brady  LPN;  Authorized by: Gordy Savers  MD;  Method used:  [facility]  [place] [address] [address], [place], [place]  [address], Ph: [phone] or [phone], Fax: [phone] Rx of NOVOTWIST 32G X 5 MM MISC (INSULIN PEN NEEDLE) as directed daily;  #30days x 6;  Signed;  Entered by: Duard Brady LPN;  Authorized by: Gordy Savers  MD;  Method used: [facility]  [place] [address] [address], [place], [place]  [address], Ph: [phone] or [phone], Fax: [phone]    Prescriptions: NOVOTWIST 32G X 5 MM MISC (INSULIN PEN NEEDLE) as directed daily  #30days x 6   Entered by:   Duard Brady LPN   Authorized by:   Gordy Savers  MD   Signed by:   Duard Brady LPN on 88/41/6606   Method used:   [facility]        [facility]  [place] [address] [facility]       [address]       [place], [place]  [address]       Ph: [phone] or [phone]       Fax: [phone]   RxID:   [id number] VICTOZA 18 MG/3ML SOLN (LIRAGLUTIDE) inject 6mg  daily for 1 wk then 12mg  daily for 1 wk then 18mg  daily as directed  #30days x 6   Entered by:   Duard Brady LPN   Authorized by:   Gordy Savers  MD   Signed by:   Duard Brady LPN on 54/62/7035   Method used:   [facility]        [facility]  [place] [address]       [address]       [place], [place]  [address]       Ph: [phone] or [phone]       Fax: [phone]   RxID:   [id number]

## 2010-02-04 NOTE — Progress Notes (Signed)
Summary: victozia and metformein refills to [facility]  Phone Note Call from Patient   Caller: Patient Call For: Spencer Savers  MD Reason for Call: Refill Medication Summary of Call: victozia and metformin refills to [facility] mail order Initial call taken by: Duard Brady LPN,  January 12, 2010 1:43 PM    Prescriptions: VICTOZA 18 MG/3ML SOLN (LIRAGLUTIDE) inject 6mg  daily for 1 wk then 12mg  daily for 1 wk then 18mg  daily as directed  #30days x 6   Entered by:   Duard Brady LPN   Authorized by:   Spencer Savers  MD   Signed by:   Duard Brady LPN on 16/10/9602   Method used:   Faxed to ...       [facility] ([facility]order)             , [place]         Ph: [phone]       Fax: [phone]   RxID:   [id number] METFORMIN HCL 1000 MG  TABS (METFORMIN HCL) one bid  #180 x 4   Entered by:   Duard Brady LPN   Authorized by:   Spencer Savers  MD   Signed by:   Duard Brady LPN on 32/44/0102   Method used:   Faxed to ...       [facility] (mail-order)             , [place]         Ph: [phone]       Fax: [phone]   RxID:   [id number]

## 2010-02-11 NOTE — Discharge Summary (Signed)
[name], [name] NO.:  [id number]  MEDICAL RECORD NO.:  [id number]          PATIENT TYPE:  INP  LOCATION:  5036                         FACILITY:  [facility]  PHYSICIAN:  Kerrin Champagne, M.D.   DATE OF BIRTH:  10-18-1955  DATE OF ADMISSION:  12/29/2009 DATE OF DISCHARGE:  01/02/2010                              DISCHARGE SUMMARY   ADMISSION DIAGNOSES: 1. Severe osteoarthritis right hip with a history of previous     avascular necrosis status post free vascularized fibular graft 16     years ago. 2. Diabetes mellitus. 3. Gastroesophageal reflux disease. 4. Hypertension 5. History of alcohol abuse, currently without any alcohol use. 6. 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  DISCHARGE DIAGNOSES: 1. Severe osteoarthritis right hip with a history of previous     avascular necrosis status post free vascularized fibular graft 16     years ago. 2. Diabetes mellitus. 3. Gastroesophageal reflux disease. 4. Hypertension 5. History of alcohol abuse, currently without any alcohol use. 6. 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 7. Posthemorrhagic anemia.  PROCEDURE:  On December 29, 2009, the patient underwent right total hip arthroplasty utilizing DePuy components, performed by Dr. Otelia Sergeant, assisted by Maud Deed, PA-C, under general anesthesia.  CONSULTATIONS:  None.  BRIEF HISTORY:  The patient is a 55 year old male status post avascular necrosis of bilateral hips treated with free vascularized fibular graft in the 1990s.  He did well for several years but then developed increasing discomfort in the left knee secondary to severe degenerative arthritis and underwent a left total knee arthroplasty approximately 2 years ago.  He now has done well with the knee replacement and is having significant pain in the right hip with difficulty ambulating and difficulty with ADLs.  Radiographs of the right hip has shown concentric arthrosis  involving the right hip joint.  It was felt that he would benefit from surgical intervention as he has failed conservative treatment.  He was admitted for this procedure.  BRIEF HOSPITAL COURSE:  The patient tolerated the procedure under general anesthesia without complications.  Postoperatively, neurovascular motor function of the lower extremities was noted to be intact.  On the first postoperative day, Hemovac drain was discontinued. Dressing changes were done daily starting on the second postoperative day and his wound was healing well throughout the hospital stay.  The patient was started on Coumadin for DVT prophylaxis with adjustments in Coumadin dose made according to daily protimes by the pharmacist at [facility].  Physical therapy was initiated for total hip replacement protocol.  He adhered to total hip replacement precautions.  He was allowed ambulation, weightbearing as tolerated, and progressed gradually with therapist utilizing a walker.  Pain control was somewhat difficult due to his chronic use of narcotic medications.  Initially, PCA medications were used and he was gradually weaned to p.o. analgesics. Eventually, OxyIR 5-15 mg was utilized every 4-6 hours as needed for pain.  Robaxin was used for muscle relaxation.  Occupational Therapy assisted with ADLs.  The patient was independent with ADLs at the time of discharge.  The patient was able to void  after his Foley catheter was discontinued.  He was stable for discharge on January 02, 2010, with arrangements made for home health physical therapy.  PERTINENT LABORATORY VALUES:  On admission, hemoglobin 16.4, hematocrit 47.7.  At discharge, hemoglobin 11.1, hematocrit 33.7.  INR at discharge 2.39.  Chemistry studies on admission were within normal limits with the exception of glucose 136.  Chemistry studies remained normal during the hospital stay.  Hemoglobin A1c was 7.2.  Urinalysis on admission was negative for  urinary tract infection.  Preoperative screening for methicillin-resistant Staph aureus and Staphylococcus aureus was negative for MRSA and positive for Staph aureus.  Postoperative x-rays of the hip showed good position and alignment.  Chest x-ray on December 23, 2009, showed findings consistent with chronic changes but no acute process or significant interval change.  PLAN:  The patient was discharged to his home.  Arrangements were made for home health physical therapy and durable medical equipment.  He will continue to ambulate weightbearing as tolerated on the operative extremity.  He will change his dressing daily or as needed.  He will be allowed to shower on January 03, 2010, if there is no wound drainage.  He will continue on a modified carbohydrate diet.  Genevieve Norlander will assist with his home health assistance.  He will follow up with Dr. Otelia Sergeant in 2 weeks from surgery.  The patient will resume medications as taken prior to admission.  He was given prescriptions for Robaxin 500 mg one every 6-8 hours as needed for spasm, OxyIR 5 mg 1-3 tablets every 4 hours as needed for pain.  He will resume all of his other home medications as taken prior to admission.  CONDITION ON DISCHARGE:  Stable.     Wende Neighbors, P.A.   ______________________________ Kerrin Champagne, M.D.    SMV/MEDQ  D:  01/28/2010  T:  01/29/2010  Job:  [id number]  Electronically Signed by Dorna Mai. on 02/03/2010 11:05:30 AM Electronically Signed by Vira Browns M.D. on 02/11/2010 08:20:03 PM

## 2010-02-15 ENCOUNTER — Encounter: Payer: Self-pay | Admitting: Internal Medicine

## 2010-02-16 ENCOUNTER — Ambulatory Visit (INDEPENDENT_AMBULATORY_CARE_PROVIDER_SITE_OTHER): Payer: Managed Care, Other (non HMO) | Admitting: Internal Medicine

## 2010-02-16 ENCOUNTER — Encounter: Payer: Self-pay | Admitting: Internal Medicine

## 2010-02-16 DIAGNOSIS — E119 Type 2 diabetes mellitus without complications: Secondary | ICD-10-CM

## 2010-02-16 DIAGNOSIS — I10 Essential (primary) hypertension: Secondary | ICD-10-CM

## 2010-02-16 DIAGNOSIS — M1990 Unspecified osteoarthritis, unspecified site: Secondary | ICD-10-CM

## 2010-02-16 LAB — HEMOGLOBIN A1C: Hgb A1c MFr Bld: 6.6 % — ABNORMAL HIGH (ref 4.6–6.5)

## 2010-02-16 MED ORDER — METHADONE HCL 10 MG PO TABS: 10.0000 mg | ORAL_TABLET | ORAL | Status: DC

## 2010-02-16 NOTE — Patient Instructions (Signed)
Limit your sodium (Salt) intake   Please check your hemoglobin A1c every 3 months    It is important that you exercise regularly, at least 20 minutes 3 to 4 times per week.  If you develop chest pain or shortness of breath seek  medical attention.  You need to lose weight.  Consider a lower calorie diet and regular exercise.  Return in 3 months for follow-up 

## 2010-02-16 NOTE — Progress Notes (Signed)
  Subjective:    Patient ID: [name], male    DOB: [birth year], 55 y.o.   MRN: [id number]  HPI  55 year old patient who is seen today for follow up.  He has type 2 diabetes and last visit was placed on Victoza; He has had much better glycemic control on this medication.  Has had no hypoglycemia.  Hemoglobin A1c is consistently over 7. He has treated hypertension on combination therapy and blood pressure has remained in a nice low normal range.  He has had some recent right hip surgery and blood pressure was well controlled in the hospital. He has osteoarthritis with persistent back and shoulder pain.  He states that he uses methadone, usually once daily, and this is much better tolerated and effective than hydrocodone.  This medication has been successfully tapered over the years. He has dyslipidemia, controlled on atorvastatin.    Review of Systems  Constitutional: Negative for fever, chills, appetite change and fatigue.  HENT: Negative for hearing loss, ear pain, congestion, sore throat, trouble swallowing, neck stiffness, dental problem, voice change and tinnitus.   Eyes: Negative for pain, discharge and visual disturbance.  Respiratory: Negative for cough, chest tightness, wheezing and stridor.   Cardiovascular: Negative for chest pain, palpitations and leg swelling.  Gastrointestinal: Negative for nausea, vomiting, abdominal pain, diarrhea, constipation, blood in stool and abdominal distention.  Genitourinary: Negative for urgency, hematuria, flank pain, discharge, difficulty urinating and genital sores.  Musculoskeletal: Negative for myalgias, back pain, joint swelling, arthralgias and gait problem.  Skin: Negative for rash.  Neurological: Negative for dizziness, syncope, speech difficulty, weakness, numbness and headaches.  Hematological: Negative for adenopathy. Does not bruise/bleed easily.  Psychiatric/Behavioral: Negative for behavioral problems and dysphoric mood. The patient  is not nervous/anxious.        Objective:   Physical Exam  Constitutional: He is oriented to person, place, and time. He appears well-developed.       obese  HENT:  Head: Normocephalic.  Right Ear: External ear normal.  Left Ear: External ear normal.  Eyes: Conjunctivae and EOM are normal.  Neck: Normal range of motion.  Cardiovascular: Normal rate and normal heart sounds.   Pulmonary/Chest: Breath sounds normal.  Abdominal: Bowel sounds are normal.  Musculoskeletal: Normal range of motion. He exhibits no edema and no tenderness.  Neurological: He is alert and oriented to person, place, and time.  Psychiatric: He has a normal mood and affect. His behavior is normal.          Assessment & Plan:  Diabetes mellitus-much improved glycemic control.  Will check a hemoglobin A1c today.  Will encourage weight loss and better diet Hypertension well controlled Dyslipidemia Osteoarthritis with chronic back pain, methadone prescription refill

## 2010-03-15 LAB — COMPREHENSIVE METABOLIC PANEL
ALT: 38 U/L (ref 0–53)
AST: 33 U/L (ref 0–37)
Albumin: 3.9 g/dL (ref 3.5–5.2)
Alkaline Phosphatase: 50 U/L (ref 39–117)
BUN: 9 mg/dL (ref 6–23)
CO2: 31 mEq/L (ref 19–32)
Calcium: 10 mg/dL (ref 8.4–10.5)
Chloride: 102 mEq/L (ref 96–112)
Creatinine, Ser: 0.81 mg/dL (ref 0.4–1.5)
GFR calc Af Amer: >60 mL/min (ref >60)
GFR calc non Af Amer: >60 mL/min (ref >60)
Glucose, Bld: 136 mg/dL — ABNORMAL HIGH (ref 70–99)
Potassium: 4.9 mEq/L (ref 3.5–5.1)
Sodium: 142 mEq/L (ref 135–145)
Total Bilirubin: 1.1 mg/dL (ref 0.3–1.2)
Total Protein: 7 g/dL (ref 6.0–8.3)

## 2010-03-15 LAB — BASIC METABOLIC PANEL
BUN: 6 mg/dL (ref 6–23)
BUN: 8 mg/dL (ref 6–23)
CO2: 30 mEq/L (ref 19–32)
CO2: 30 mEq/L (ref 19–32)
Calcium: 8.2 mg/dL — ABNORMAL LOW (ref 8.4–10.5)
Calcium: 8.2 mg/dL — ABNORMAL LOW (ref 8.4–10.5)
Chloride: 100 mEq/L (ref 96–112)
Chloride: 102 mEq/L (ref 96–112)
Creatinine, Ser: 0.67 mg/dL (ref 0.4–1.5)
Creatinine, Ser: 0.71 mg/dL (ref 0.4–1.5)
GFR calc Af Amer: >60 mL/min (ref >60)
GFR calc Af Amer: >60 mL/min (ref >60)
GFR calc non Af Amer: >60 mL/min (ref >60)
GFR calc non Af Amer: >60 mL/min (ref >60)
Glucose, Bld: 164 mg/dL — ABNORMAL HIGH (ref 70–99)
Glucose, Bld: 176 mg/dL — ABNORMAL HIGH (ref 70–99)
Potassium: 3.8 mEq/L (ref 3.5–5.1)
Potassium: 3.8 mEq/L (ref 3.5–5.1)
Sodium: 135 mEq/L (ref 135–145)
Sodium: 136 mEq/L (ref 135–145)

## 2010-03-15 LAB — TYPE AND SCREEN
ABO/RH(D): A POS
Antibody Screen: NEGATIVE

## 2010-03-15 LAB — CBC
HCT: 33.7 % — ABNORMAL LOW (ref 39.0–52.0)
HCT: 33.8 % — ABNORMAL LOW (ref 39.0–52.0)
HCT: 35.6 % — ABNORMAL LOW (ref 39.0–52.0)
HCT: 36.9 % — ABNORMAL LOW (ref 39.0–52.0)
HCT: 47.7 % (ref 39.0–52.0)
Hemoglobin: 11 g/dL — ABNORMAL LOW (ref 13.0–17.0)
Hemoglobin: 11.1 g/dL — ABNORMAL LOW (ref 13.0–17.0)
Hemoglobin: 11.7 g/dL — ABNORMAL LOW (ref 13.0–17.0)
Hemoglobin: 12 g/dL — ABNORMAL LOW (ref 13.0–17.0)
Hemoglobin: 16.4 g/dL (ref 13.0–17.0)
MCH: 28.9 pg (ref 26.0–34.0)
MCH: 29 pg (ref 26.0–34.0)
MCH: 29.3 pg (ref 26.0–34.0)
MCH: 29.3 pg (ref 26.0–34.0)
MCH: 30.5 pg (ref 26.0–34.0)
MCHC: 32.5 g/dL (ref 30.0–36.0)
MCHC: 32.5 g/dL (ref 30.0–36.0)
MCHC: 32.9 g/dL (ref 30.0–36.0)
MCHC: 32.9 g/dL (ref 30.0–36.0)
MCHC: 34.4 g/dL (ref 30.0–36.0)
MCV: 88 fL (ref 78.0–100.0)
MCV: 88.7 fL (ref 78.0–100.0)
MCV: 88.9 fL (ref 78.0–100.0)
MCV: 89.2 fL (ref 78.0–100.0)
MCV: 89.9 fL (ref 78.0–100.0)
Platelets: 154 10*3/uL (ref 150–400)
Platelets: 170 10*3/uL (ref 150–400)
Platelets: 190 10*3/uL (ref 150–400)
Platelets: 193 10*3/uL (ref 150–400)
Platelets: 220 10*3/uL (ref 150–400)
RBC: 3.76 MIL/uL — ABNORMAL LOW (ref 4.22–5.81)
RBC: 3.83 MIL/uL — ABNORMAL LOW (ref 4.22–5.81)
RBC: 3.99 MIL/uL — ABNORMAL LOW (ref 4.22–5.81)
RBC: 4.15 MIL/uL — ABNORMAL LOW (ref 4.22–5.81)
RBC: 5.38 MIL/uL (ref 4.22–5.81)
RDW: 13.7 % (ref 11.5–15.5)
RDW: 13.9 % (ref 11.5–15.5)
RDW: 14 % (ref 11.5–15.5)
RDW: 14 % (ref 11.5–15.5)
RDW: 14.1 % (ref 11.5–15.5)
WBC: 10.2 10*3/uL (ref 4.0–10.5)
WBC: 11.7 10*3/uL — ABNORMAL HIGH (ref 4.0–10.5)
WBC: 12 10*3/uL — ABNORMAL HIGH (ref 4.0–10.5)
WBC: 8.2 10*3/uL (ref 4.0–10.5)
WBC: 9.8 10*3/uL (ref 4.0–10.5)

## 2010-03-15 LAB — GLUCOSE, CAPILLARY
Glucose-Capillary: 128 mg/dL — ABNORMAL HIGH (ref 70–99)
Glucose-Capillary: 141 mg/dL — ABNORMAL HIGH (ref 70–99)
Glucose-Capillary: 142 mg/dL — ABNORMAL HIGH (ref 70–99)
Glucose-Capillary: 152 mg/dL — ABNORMAL HIGH (ref 70–99)
Glucose-Capillary: 162 mg/dL — ABNORMAL HIGH (ref 70–99)
Glucose-Capillary: 164 mg/dL — ABNORMAL HIGH (ref 70–99)
Glucose-Capillary: 166 mg/dL — ABNORMAL HIGH (ref 70–99)
Glucose-Capillary: 169 mg/dL — ABNORMAL HIGH (ref 70–99)
Glucose-Capillary: 170 mg/dL — ABNORMAL HIGH (ref 70–99)
Glucose-Capillary: 173 mg/dL — ABNORMAL HIGH (ref 70–99)
Glucose-Capillary: 184 mg/dL — ABNORMAL HIGH (ref 70–99)
Glucose-Capillary: 185 mg/dL — ABNORMAL HIGH (ref 70–99)
Glucose-Capillary: 187 mg/dL — ABNORMAL HIGH (ref 70–99)
Glucose-Capillary: 198 mg/dL — ABNORMAL HIGH (ref 70–99)
Glucose-Capillary: 207 mg/dL — ABNORMAL HIGH (ref 70–99)
Glucose-Capillary: 242 mg/dL — ABNORMAL HIGH (ref 70–99)

## 2010-03-15 LAB — URINALYSIS, ROUTINE W REFLEX MICROSCOPIC
Bilirubin Urine: NEGATIVE
Glucose, UA: NEGATIVE mg/dL
Ketones, ur: NEGATIVE mg/dL
Leukocytes, UA: NEGATIVE
Nitrite: NEGATIVE
Protein, ur: NEGATIVE mg/dL
Specific Gravity, Urine: 1.012 (ref 1.005–1.030)
Urobilinogen, UA: 0.2 mg/dL (ref 0.0–1.0)
pH: 7 (ref 5.0–8.0)

## 2010-03-15 LAB — PROTIME-INR
INR: 0.9 (ref 0.00–1.49)
INR: 1.12 (ref 0.00–1.49)
INR: 1.43 (ref 0.00–1.49)
INR: 1.65 — ABNORMAL HIGH (ref 0.00–1.49)
INR: 2.39 — ABNORMAL HIGH (ref 0.00–1.49)
Prothrombin Time: 12.4 seconds (ref 11.6–15.2)
Prothrombin Time: 14.6 seconds (ref 11.6–15.2)
Prothrombin Time: 17.6 seconds — ABNORMAL HIGH (ref 11.6–15.2)
Prothrombin Time: 19.7 seconds — ABNORMAL HIGH (ref 11.6–15.2)
Prothrombin Time: 26.2 seconds — ABNORMAL HIGH (ref 11.6–15.2)

## 2010-03-15 LAB — POCT I-STAT 4, (NA,K, GLUC, HGB,HCT)
Glucose, Bld: 216 mg/dL — ABNORMAL HIGH (ref 70–99)
HCT: 43 % (ref 39.0–52.0)
Hemoglobin: 14.6 g/dL (ref 13.0–17.0)
Potassium: 4.1 mEq/L (ref 3.5–5.1)
Sodium: 136 mEq/L (ref 135–145)

## 2010-03-15 LAB — DIFFERENTIAL
Basophils Absolute: 0 10*3/uL (ref 0.0–0.1)
Basophils Relative: 1 % (ref 0–1)
Eosinophils Absolute: 0.4 10*3/uL (ref 0.0–0.7)
Eosinophils Relative: 5 % (ref 0–5)
Lymphocytes Relative: 40 % (ref 12–46)
Lymphs Abs: 3.3 10*3/uL (ref 0.7–4.0)
Monocytes Absolute: 0.7 10*3/uL (ref 0.1–1.0)
Monocytes Relative: 8 % (ref 3–12)
Neutro Abs: 3.9 10*3/uL (ref 1.7–7.7)
Neutrophils Relative %: 47 % (ref 43–77)

## 2010-03-15 LAB — SURGICAL PCR SCREEN
MRSA, PCR: NEGATIVE
Staphylococcus aureus: POSITIVE — AB

## 2010-03-15 LAB — HEMOGLOBIN A1C
Hgb A1c MFr Bld: 7.2 % — ABNORMAL HIGH (ref <5.7)
Mean Plasma Glucose: 160 mg/dL — ABNORMAL HIGH (ref <117)

## 2010-03-15 LAB — URINE MICROSCOPIC-ADD ON

## 2010-04-19 ENCOUNTER — Other Ambulatory Visit: Payer: Self-pay

## 2010-04-19 MED ORDER — METHADONE HCL 10 MG PO TABS: 10.0000 mg | ORAL_TABLET | ORAL | Status: DC

## 2010-04-19 NOTE — Telephone Encounter (Signed)
Pt aware ready for pick pt

## 2010-04-19 NOTE — Telephone Encounter (Signed)
Pt called requestiong refill on methadone - call when ready for pick up

## 2010-05-18 ENCOUNTER — Ambulatory Visit (INDEPENDENT_AMBULATORY_CARE_PROVIDER_SITE_OTHER): Payer: Managed Care, Other (non HMO) | Admitting: Internal Medicine

## 2010-05-18 ENCOUNTER — Encounter: Payer: Self-pay | Admitting: Internal Medicine

## 2010-05-18 DIAGNOSIS — K219 Gastro-esophageal reflux disease without esophagitis: Secondary | ICD-10-CM

## 2010-05-18 DIAGNOSIS — M1990 Unspecified osteoarthritis, unspecified site: Secondary | ICD-10-CM

## 2010-05-18 DIAGNOSIS — E119 Type 2 diabetes mellitus without complications: Secondary | ICD-10-CM

## 2010-05-18 DIAGNOSIS — I10 Essential (primary) hypertension: Secondary | ICD-10-CM

## 2010-05-18 LAB — GLUCOSE, POCT (MANUAL RESULT ENTRY): POC Glucose: 153

## 2010-05-18 LAB — HEMOGLOBIN A1C: Hgb A1c MFr Bld: 7.5 % — ABNORMAL HIGH (ref 4.6–6.5)

## 2010-05-18 MED ORDER — METHADONE HCL 10 MG PO TABS: 10.0000 mg | ORAL_TABLET | ORAL | Status: DC

## 2010-05-18 MED ORDER — METFORMIN HCL ER (OSM) 500 MG PO TB24: 500.0000 mg | ORAL_TABLET | Freq: Two times a day (BID) | ORAL | Status: DC

## 2010-05-18 NOTE — Assessment & Plan Note (Signed)
[facility]                            [facility] OFFICE NOTE   NAME:[name], [name]                      MRN:          [id number]  DATE:05/31/2006                            DOB:          Apr 22, 1955    A 55 year old gentleman seen today for an annual exam. He has a fairly  recent diagnosis of type 2 diabetes. He has a history of bifascicular  block, hypertension, colonic polyps, hyperlipidemia. He has a chronic  pain syndrome on chronic methadone.   Surgical procedures have included foot surgery, cervical diskectomy on 2  occasions and bilateral hip surgery. Additionally he has a history of  chronic back pain, gastroesophageal reflux disease. He had a remote  appendectomy.   MEDICATIONS:  Medical regimen reviewed.   REVIEW OF SYSTEMS:  Negative. He did have a negative Cardiolite in 2005.  His last colonoscopy was in 2003, he is scheduled for followup this  year.   FAMILY HISTORY:  Father is deceased at 51. He had a history of Crohn's  disease, ischemic bowel, peripheral vascular occlusive disease. Mother  has a history of lung cancer. One brother has alcoholism.   PHYSICAL EXAMINATION:  GENERAL:  Obese male in no acute distress.  VITAL SIGNS:  Weight was 280. Blood pressure 140/90.  SKIN:  Negative.  HEENT:  Fundi, ear, nose and throat clear.  NECK:  No bruits.  CHEST:  Clear.  CARDIOVASCULAR:  Normal heart sounds, no murmurs.  ABDOMEN:  Obese, soft and nontender. He did have a ventral and umbilical  hernia.  EXTERNAL GENITALIA:  Normal.  EXTREMITIES:  Revealed intact peripheral pulses.  NEUROLOGIC:  Negative. Intact to vibratory sensation and monofilament  testing.  RECTAL:  Prostate +2, enlarged and benign. Stool heme negative.   IMPRESSION:  1. Type 2 diabetes.  2. Hypertension, suboptimal control.  3. Hypercholesterolemia.  4. Exogenous obesity.  5. Chronic pain syndrome.   DISPOSITION:  Medical regimen unchanged except  lisinopril will be  changed to lisinopril HCT 20/25. Recheck in 3 months for followup.  Weight loss and lifestyle issues encouraged as well as a screening  colonoscopy.     Gordy Savers, MD  Electronically Signed    PFK/MedQ  DD: 05/31/2006  DT: 05/31/2006  Job #: [id number]

## 2010-05-18 NOTE — Op Note (Signed)
NAME:  [name], [name] NO.:  [id number]   MEDICAL RECORD NO.:  [id number]          PATIENT TYPE:  INP   LOCATION:  5017                         FACILITY:  [facility]   PHYSICIAN:  Kerrin Champagne, M.D.   DATE OF BIRTH:  08/08/1955   DATE OF PROCEDURE:  11/20/2007  DATE OF DISCHARGE:                               OPERATIVE REPORT   PREOPERATIVE DIAGNOSIS:  Tricompartment osteoarthritis, left knee.   POSTOPERATIVE DIAGNOSES:  Medial joint line osteoarthritis greater than  lateral joint line with completely degenerative menisci medially.  Patellofemoral arthrosis, mild.   PROCEDURE:  Left total knee arthroplasty, cemented, computer-assisted  with #5 cemented femoral and tibial PFC components, and 10-mm rotating  platform tray with 35-mm patella.   SURGEON:  Kerrin Champagne, MD   ASSISTANT:  Wende Neighbors, PA-C.   ANESTHESIA:  General via orotracheal intubation, Dr. Judie Petit.   FINDINGS:  Medial greater than lateral joint line osteoarthritis with  osteophytes, bone-on-bone anatomy as well as completely degenerated  medial meniscus.  Lateral joint line in good condition.   DRAINS:  Foley to straight drain, Hemovac x1, left knee.   ESTIMATED BLOOD LOSS:  150 mL.   COMPLICATIONS:  None.   TOTAL TOURNIQUET TIME:  Two hours and 4 minutes at 350 mmHg.   The patient returned to the PACU in good condition.   HISTORY OF PRESENT ILLNESS:  This patient is a 55 year old male who has  undergone previous bilateral hip free vascularized fibular grafts for  AVN and had done fairly well.  I have been treating him for degenerative  joint disease of the knees over the last several years with repetitive  injection treatment primarily because of his inability to tolerate  NSAIDs due to ulcers and GI difficulties.  The patient has a history of  hypertension and diabetes.  He is taking medicines for pain chronically,  has a history of lumbar degenerative disk disease, has  had 2 levels  fused.  Previous fibular resection, both sides.  His exam has shown  grading of patellofemoral joint, primary medial joint line  osteoarthritis changes radiographically with varus knee.  The patient is  brought to the operating room to undergo left total knee arthroplasty as  he is becoming increasingly resistant to the effects of the medicines  and the anterior articular including both viscoelastic supplements and  steroids.   INTRAOPERATIVE FINDINGS:  As above.   DESCRIPTION OF PROCEDURE:  After adequate general anesthesia, the  patient had lower extremity prepped from the thigh to the toes with  DuraPrep solution.  He was draped in the usual manner, iodine Vi-Drape  was used.  He will rest in lateral bump over the left thigh.  Following  standard prep with DuraPrep, he was draped as noted.  The left lower  extremity elevated and exsanguinated with Esmarch bandage.  Tourniquet  inflated to 150 mmHg.  Incision with standard midline incision extending  along the medial aspect of the anterior tibial tubercle along the  central portion of the patella and then central portion of the  quadriceps proximally.  The  total incision length approximately 12-14 cm  through the skin and subcutaneous layers directly down to the external  retinaculum and the peritenon.  This was incised in line with the skin  incision with medial and lateral reapproximation.  Incision then carried  medial parapatellar, preserving the cuff of tissue for reapproximating  the retinaculum medially incising the synovium of the knee, entering the  knee joint.  Dividing the fat pad along the anterior and medial aspect  of the patellar tendon down to bone over the proximal tibia, then  extending proximally, incising superiorly, and often under cutting the  superior most aspect of the incision site in order to mobilize the  patella, allow for eversion of the patella.  Posterior retropatellar fat  pads then  debrided to about 40%-50% as well as the medial prepatellar  and pretibial fat pad.  Subperiosteal dissection carried immediately,  elevating the patient's medial collateral ligament and pes anserine from  the medial aspect of the proximal tibia around the posterior corner  medially.  Medial meniscus debrided were visible, anteriorly and  medially.  The anterior aspect of the tibia also exposed laterally.  Lateral meniscus excised were visible anteriorly.  Knee flexed,  osteophytes removed over the femur and the notch debrided the anterior  and posterior cruciate ligaments using electrocautery back to the area  of the posterior knee capsule.  Knee able to be subluxed with this using  towel clips where appropriate.  Fat pad over the anterior aspect of the  distal femur just above the femoral condyle was incised centrally in the  midline and developed the medial and lateral flaps to expose to distal  anterior aspect of the femur above the femoral condyle.  The knee then  measured showed flexion about 2 degrees, varus in about 3-1/2 to 4  degrees.  The patient's measurements were taken after first placement of  the femoral and tibial pins and note that these were done placing 2 pins  inside the superior portion of the incision along the anterior and  medial aspect of the distal femur using the Schanz screws placed.  Additional screws were then placed along the anterior and medial aspect  of the crest of the tibia approximately 8-9 inches below the joint line.  Y ray was placed distally and T ray proximally.   These were then used for information purposes for the [facility] computer.   Proved to be in an excellent position alignment and was quite stable.  With this in registration of points where the access of the hip were  obtained by careful rotation of the hip using the computer.  Then, using  a wand, identification of the medial and lateral epicondyles of the  ankle identified as well as the  central portion of the anterior cruciate  ligament attachment to the superior aspect of the tibia, the anterior  surface of the tibia and medial and lateral surface as posterior surface  of rotation line as well of the tibia.  Next, mapping was performed to  the anterior aspect of the tibia and then the medial tibial plateau and  lateral tibial plateau.  Computer used to obtain an image model of the  proximal tibia verification performed within 0.5 mm.  Attention then  turned to the femur where using a wand, then identification was made at  the anterior aspect of the intercondylar notch.  Medial and lateral  epicondyles, the anterior aspect of the distal femur and then mapping  performed of the  distal femur, medially and laterally, mapping the  condyles over its distal extend and posterior extend, both sides.  Then,  pinning performed of the anterior distal femur in order to determine the  degree of flexion, verification was performed following modeling of the  femur and verification of the degree of anterior bow of the distal  femur, just judged to be about 1.9 degrees.  This completed, then  decision was made to use #5 tibia.  A cutting jig with verification  guide was then used and carefully performed.  A pinning of the jig to  the proximal aspect of tibia less than 1 degree varus and valgus and  less than 1 mm off from the planned pinning of the guide.  The subluxed  and soft tissues protected using the KOs and Homans and the proximal  tibia cut was performed.  This was done without difficulty using  oscillating saw through the jig.  It was pinned in place with 3 pins.  Note, the verification was carried out, then following the cut,  demonstrated it was well within limits with some signs of superficial  cutting of the posterior aspect of the proximal tibia, so this was recut  removing a very small amount with the oscillating saw, then rechecking  and finding it within 1 degree, 1 mm  expected cut.  Proximal cutting  guide was then removed.  Attention turned to the femur after first  performing some block checking using an appropriate extension block.   Tensioning also was determined to be quite good in both flexion,  extension following the cutting of the proximal tibia.  Attention then  turned to the distal femoral cut after first determining the correct  size for the femur was adjusted with a 5 mm with #5 component was closer  to the patient's native size of the distal femur and #5 was chosen in  order to place this adequately with soft tissue correction was within a  millimeter or so.  The prosthesis was anteriorly placed 2 mm and  slightly hyperextended in order to obtain correct degree of correction  of the soft tissues and soft tissue alignment.  With this end, a cutting  guide for the distal femur transverse cut, then pinned to the distal  femur using the verification guide provided.  Once this was pinned in  place using 3 pins less than 1 degree, 1 mm expected cut, a transverse  cut was performed without difficulty, verified, and required further  trimming of the distal portions of the cut that again had skived  posteriorly.  After cutting this, then verification was performed, was  within 1 mm, 1 degree.  A cutting block was then removed.  Rotation  guide then used to correctly determine rotation for the expected  prosthesis on the end of the cut femur.  The rotation guide block was  then used to place pins for placement of holes for the cutting guide for  the chamfer and coronal cuts.  This guide was then used placed in the  holes, that were made, impacted into place, held in place with tongs.  Anterior coronal cut, posterior coronal cuts were then performed  protecting soft tissues with Crego retractors where necessary.  The  posterior chamfer cut was then performed then anterior chamfer cut.  This completed the tibial and the initial cuts for the femurs,  though  the tibia was then subluxed.  Any residual material, cartilage that was  remaining was resected posteriorly as  well as any residual remaining  anterior and posterior cruciate material.  The knee was then tested for  block testing and extension and flexion, 10 mm was good fit in both  extension and flexion, 12.5 appeared to be good in extension but not in  flexion.  So, it was felt that the 10 probably would end up being the  insert size with decreased intensity for any soft tissue release  further.  Upon determining the soft tissue, tensioning was correct, then  attention was turned to placing of the proximal tibia plate and #5 plate  provided excellent cover.  This was then held over the proximal tibia  and the correct degree of internal rotation.  Two pins used to pin the  plates of the cut surface of the proximal portion of the tibia.  The  upright cylinder were used for reaming of the keel for the tibial  implant, it is then placed and impacted into place on the upper aspect  of this plate.  Reamer was then set and reverse was used to ream for the  proximal tibial keel of the component.  This completed, then the wing  osteotome was then impacted into place and this provided for the central  portion of the trial reduction of the tibial components.  Attention  turned to the femur, where the box jig for cutting of the box over the  intercondylar portion of the femur was then pinned to the distal femur  underlying correctly.  Oscillating saw then used to divide the  transverse proximal portion of the box cut first and then cuts were then  made in the saggital plane using the cutting guide down on to this blade  protecting the soft tissues and further bone for many further cuts  beyond expected cut.  This completed, then the cutting guide was removed  after ensuring there was no residual bone within the notch present.  This completed, the trial femoral component was impacted into  place and  a lug drill hole was placed over the end of the femur and this was  placed.  Trial reduction using a #10 demonstrated an excellent stability  and varus and valgus stress, slight shuck present with flexion.  Flexion  is well up to 124 degrees in full extension, in fact  hyperextension of  2 degrees noted.  The patella was sized with thickness almost 26 mm.  A  35-mm component appeared to give the best size for his patella of  medialization and the bone superior and inferior to allow for a good  placement of the component.  A cut allowing for residual with anterior  patella thickness of 16 mm was then carefully adjusted and placed it for  the cut.  This was clamped into place.  Cutting then performed removing  down to 16 mm of residual patella thickness.  Next, the drill guide for  the 35-mm patella was then placed against the posterior surface of the  patella and impacted into place.  Slightly medialized in the correct  degree of rotation and then drill hole was placed x3.  Irrigation was  then carried out following removal of all the trial prosthesis after  placing through range of motion determining correct degree of stability  in the medial and lateral structures.  This completed, the knee was then  irrigated using copious amounts of pulsatile irrigant solution.  Prosthesis were brought in the field including #5 femoral and tibial  components, 35-mm patella and the  trial would be attempted following the  placement of these to determine the correct permanent tibial tray  implant.  Following irrigation, then careful drying the knee, knee was  hyperflexed.  A cannula was used to sublux the tibia and to retract the  lateral structures for exposure of the proximal tibia, then the cement  was in proper consistency, though it was then placed within the keel  area for the tibial keel and over the proximal surface of the tibia.  Implant also has cement placed over its deeper surface as  this was  reassessed.  The prosthesis correctly oriented and rotated to slight  internal rotation, so expected capturing the wings of the implant.  This  was impacted into place and the excess cement was removed  circumferentially about the tibial component prior to attention turned  to the distal femur.  First cement was placed into the leg opening on the anterior aspect of the distal femur as well as over the cut  transverse surface and posterior chamfer cut.  Additional cement was  placed over the posterior runners of the expected implant.  The femoral  component has cement placed here, the femoral component was then  impacted into place, small amount was placed within the box area.  Impaction carried out using the large impacter.  The correct degree  rotation and excess cement was then removed circumferentially above the  femoral component and the knee brought into full extension.  The patella  surface that was cut was carefully dried and cement applied.  Each of  the hole areas had been cauterized to identify for placement of the  prosthesis.  Additional cement placed over the anterior deeper surface  of the patella implant and the implant was then carefully pressed into  place, its pegs capturing the peg holes.  Excess cement was then removed  using the elevators provided.  The patella clamp applied.  Cement went  on to hardening after  a period of 5-10 minutes.  This was completely  hardened and patella clamp was removed, any excess cement was  additionally removed using either scalpel or osteotomes.  Excess cement  above the tibial component was removed also using osteotomes and  scalpel, also with the femur present.  Irrigation was carried out and  the patient's knee placed through range of motion of hyperextension  about 2-4 degrees, flexion of about 128 degrees, possible knee and  stable varus and valgus stress with slight shuck present.  Attempt was  made to use the 12.5-mm  implant.  This provided for flexion contracture  of about 2 degrees, ability to fully extend the knee, flexion possible  then to only about 115 degrees before lift off of the patella.  It was  felt that this insert was too tight.  That completed, then a 10-mm  insert was chosen.  Tourniquet was released.  Bleeders controlled using  electrocautery.  Careful inspection of posterior runners to posterior  aspect of the joint demonstrated no significant abnormality here.   Following irrigation, then knee was subluxed and the permanent 10-mm  rotating platform tibial tray with upright post was then placed, this  posterior stabilized the knee and this was then reduced through range of  motion of full extension and hyperextension of 2 degrees, flexion then  to 128 degrees possible, knee stable varus and valgus stress with  minimal degree of shuck with clamps in place.  Medium Hemovac drain  placed in the depth of the incision.  A synovial area reapproximated  with interrupted 0 Vicryl sutures.  The retinaculum of the knee  approximated with interrupted #1 Vicryl sutures after removal of pins  arrays  the computer itself.  The patient did have varus about 1.5-2  degrees noted on the final computer study, but clinically appeared to be  in slight valgus for about 3-4 degrees.  With the closure of the  external retinaculum of the knee, the peritenon approximated with  interrupted 0 and 2-0 Vicryl sutures.  Deep subcu layer was approximated  with interrupted 0 Vicryl sutures and 2-0 Vicryl sutures and skin closed  with a running subcu stitch of 4-0 Vicryl.  Tincture benzoin and Steri-  Strips applied.  The patient then had 4 x 4s, ABD pad placed to its  sterile Webril.  Ace wrap applied.  The patient was then carefully  reactivated, extubated, and returned to recovery room in satisfactory  condition.  All instruments and sponge counts were correct.  Noted that  preoperatively this patient had  identification of correct leg marking in  the preop holding area.  Intraoperative time-out identifying the  patient, the site of procedure and the persons performing the procedure  expected concerns of complications.      Kerrin Champagne, M.D.  Electronically Signed     JEN/MEDQ  D:  11/20/2007  T:  11/21/2007  Job:  [id number]

## 2010-05-18 NOTE — Progress Notes (Signed)
  Subjective:    Patient ID: [name], male    DOB: 1955/02/01, 55 y.o.   MRN: [id number]  HPI 55 year old patient who is seen today for followup of his type 2 diabetes. His last hemoglobin A1c was fine less than 7. He has been on vICTOZA with nice control of his blood sugars. Fasting blood sugars are generally in the 110 range. He states for the past 2 months he is said nausea and diarrhea. He has had a nice weight loss on the medication he states there is good blood sugars are in the mid 60s He has a history of osteoarthritis and chronic low back pain he requires a methadone refill today. He has treated hypertension which has done well today with his weight loss. He does have a history of gastrointestinal reflux disease and colonic polyps. His last colonoscopy was 4 years ago. Thighs any reflux symptoms.   Review of Systems  Constitutional: Positive for unexpected weight change. Negative for fever, chills, appetite change and fatigue.  HENT: Negative for hearing loss, ear pain, congestion, sore throat, trouble swallowing, neck stiffness, dental problem, voice change and tinnitus.   Eyes: Negative for pain, discharge and visual disturbance.  Respiratory: Negative for cough, chest tightness, wheezing and stridor.   Cardiovascular: Negative for chest pain, palpitations and leg swelling.  Gastrointestinal: Positive for nausea. Negative for vomiting, abdominal pain, diarrhea, constipation, blood in stool and abdominal distention.  Genitourinary: Negative for urgency, hematuria, flank pain, discharge, difficulty urinating and genital sores.  Musculoskeletal: Negative for myalgias, back pain, joint swelling, arthralgias and gait problem.  Skin: Negative for rash.  Neurological: Negative for dizziness, syncope, speech difficulty, weakness, numbness and headaches.  Hematological: Negative for adenopathy. Does not bruise/bleed easily.  Psychiatric/Behavioral: Negative for behavioral problems and  dysphoric mood. The patient is not nervous/anxious.        Objective:   Physical Exam  Constitutional: He is oriented to person, place, and time. He appears well-developed and well-nourished.  HENT:  Head: Normocephalic.  Right Ear: External ear normal.  Left Ear: External ear normal.  Eyes: Conjunctivae and EOM are normal.  Neck: Normal range of motion.  Cardiovascular: Normal rate and normal heart sounds.   Pulmonary/Chest: Breath sounds normal.  Abdominal: Soft. Bowel sounds are normal. He exhibits no distension. There is no tenderness. There is no rebound.       Obese soft and nontender  Musculoskeletal: Normal range of motion. He exhibits no edema and no tenderness.  Neurological: He is alert and oriented to person, place, and time.  Psychiatric: He has a normal mood and affect. His behavior is normal.          Assessment & Plan:   Diabetes mellitus. Well controlled Nausea diarrhea. Will down titrate both his metformin and Victoza Hypertension well controlled Osteoarthritis with chronic low back pain. Methadone prescription refilled

## 2010-05-18 NOTE — Progress Notes (Signed)
Quick Note:  Spoke with pt - informed of dr. Vernon Prey instructions for diet ,exercise and wt loss. ______

## 2010-05-18 NOTE — Progress Notes (Signed)
  Subjective:    Patient ID: [name], male    DOB: 21-Apr-1955, 55 y.o.   MRN: [id number]  HPI  Wt Readings from Last 3 Encounters:  05/18/10 257 lb (116.574 kg)  02/16/10 271 lb (122.925 kg)  11/17/09 270 lb (122.471 kg)     Review of Systems     Objective:   Physical Exam        Assessment & Plan:

## 2010-05-18 NOTE — Patient Instructions (Signed)
Limit your sodium (Salt) intake    It is important that you exercise regularly, at least 20 minutes 3 to 4 times per week.  If you develop chest pain or shortness of breath seek  medical attention.   Please check your hemoglobin A1c every 3 months  You need to lose weight.  Consider a lower calorie diet and regular exercise.   

## 2010-05-18 NOTE — Discharge Summary (Signed)
[name], [name] NO.:  [id number]   MEDICAL RECORD NO.:  [id number]          PATIENT TYPE:  INP   LOCATION:  5017                         FACILITY:  [facility]   PHYSICIAN:  Kerrin Champagne, M.D.   DATE OF BIRTH:  03-20-1955   DATE OF ADMISSION:  11/20/2007  DATE OF DISCHARGE:  11/23/2007                               DISCHARGE SUMMARY   ADMISSION DIAGNOSES:  1. Severe osteoarthritis of the left knee.  2. Diabetes mellitus.  3. Gastroesophageal reflux disease.  4. Hypertension.  5. History of alcoholism.   DISCHARGE DIAGNOSES:  1. Severe osteoarthritis of the left knee.  2. Diabetes mellitus.  3. Gastroesophageal reflux disease.  4. Hypertension.  5. History of alcoholism.  6. Post-hemorrhagic anemia.   PROCEDURE:  On November 20, 2007, the patient underwent left total knee  arthroplasty, cemented and computer-assisted performed by Dr. Otelia Sergeant  assisted by Maud Deed, PA-C, under general anesthesia.   CONSULTATIONS:  None.   BRIEF HISTORY:  The patient is a 55 year old male with chronic and  progressive knee pain bilaterally.  His left knee is more symptomatic  than the right.  He has grating with range of motion, particularly in  the patellofemoral joint and pain at the medial joint line.  Radiographically, he has significant osteoarthritis and has a varus knee  on the left.  He has failed conservative treatment including intra-  articular injections as well as viscous supplementation and oral  medications.  It was felt he would benefit from surgical intervention  and was admitted to the procedure as stated above.   BRIEF HOSPITAL COURSE:  The patient tolerated the procedure under  general anesthesia without complications.  Postoperatively,  neurovascular motor function of the lower extremities was noted to be  intact.  He was treated with PCA analgesics and weaned to p.o.  analgesics gradually.  The patient did have history of chronic narcotic  use  and had difficulty maintaining pain control but eventually was able  to utilize muscle relaxers and oral analgesics for his discomfort.  He  was started on Coumadin for DVT prophylaxis.  Adjustments in Coumadin  dose were made according to daily protimes by the pharmacist at [facility].  The patient's diabetes was treated with sliding scale insulin  and then he resumed his usual oral agents on postop day #2.  The patient  was started on physical therapy for ambulation, gait training, range of  motion, stretching, and strengthening exercises.  CPM machine was  utilized for passive range of motion to the left knee.  He was allowed  weightbearing as tolerated on the left lower extremity.  Eventually, he  was able to ambulate as much as 100 feet with physical therapist.  Occupational therapy assisted with ADLs.  The patient was independent  with ADLs.  Durable medical equipment arranged for home use.  Dressing  changes were done daily, and the patient's wound was found to be healing  without erythema, edema, or drainage.  The patient was afebrile with  vital signs stable on the day of discharge.  He was discharged to  his  home.   PERTINENT LABORATORY VALUES:  On admission, CBC within normal limits.  Hemoglobin 16.1 and hematocrit 46.9 on admission.  The patient dropped  to a lowest value of 12.7 hemoglobin with hematocrit 37.2 and at  discharge hemoglobin and hematocrit stable at 13.3 and 39.1  respectively.  On admission, coagulation studies normal.  At discharge,  INR 2.6.  Chemistry studies on admission within normal limits.  Hemoglobin A1c on admission 7.4.  Urinalysis on admission negative for  urinary tract infection.  The patient did have a Foley catheter, which  was discontinued, and the patient was voiding independently prior to  discharge.   PLAN:  The patient was discharged to his home.  Arrangements were made  for home health physical therapy as well as Durable medical  equipment.  He will utilize the CPM machine at home as well.  The patient will  change his dressing daily.  He will be allowed to shower.  He will  continue on a low-carbohydrate diet.  He is advised to follow up with  Dr. Otelia Sergeant 2 weeks' postop.  Coumadin management will be arranged through  a Home Health Agency.   PRESCRIPTIONS AT DISCHARGE:  1. Coumadin 5 mg once daily or as directed.  2. Dilaudid 4 mg 1-2 every 4-6 hours as needed for pain.  3. Percocet 5/325 one to two every 4-6 hours as needed for pain.  4. Methadone 10 mg #120 one p.o. q.6 h., which he was taking      preoperatively as well.  5. Over-the-counter MiraLax or other stool softener is recommended on      a daily basis.   All questions encouraged and answered.   CONDITION ON DISCHARGE:  Stable.      Wende Neighbors, P.A.      Kerrin Champagne, M.D.  Electronically Signed    SMV/MEDQ  D:  01/10/2008  T:  01/11/2008  Job:  [id number]

## 2010-05-21 NOTE — Op Note (Signed)
NAME:  [name], [name]                         ACCOUNT NO.:  [id number]   MEDICAL RECORD NO.:  [id number]                   PATIENT TYPE:  OIB   LOCATION:  2873                                 FACILITY:  [facility]   PHYSICIAN:  Kerrin Champagne, M.D.                DATE OF BIRTH:  [birth year]   DATE OF PROCEDURE:  04/08/2003  DATE OF DISCHARGE:                                 OPERATIVE REPORT   PREOPERATIVE DIAGNOSIS:  Retained hardware, left hip, status post open  reduction and internal fixation of a subtrochanteric femur fracture, with  prominent hardware in the region of the left trochanteric bursa causing  recurrent bursitis, prominent compression screw laterally, as well as  loosening of a 4.5 screw within the central portion of the compression screw  and side plate.   POSTOPERATIVE DIAGNOSIS:  Retained hardware, left hip, status post open  reduction and internal fixation of a subtrochanteric femur fracture, with  prominent hardware in the region of the left trochanteric bursa causing  recurrent bursitis, prominent compression screw laterally, as well as  loosening of a 4.5 screw within the central portion of the compression screw  and side plate.   PROCEDURES:  1. Removal of a compression screw from the left hip open reduction and     internal fixation device, a compression screw and side plate, 90 degrees.  2. Also removal of a single 4.5 screw from within the central portion of the     plate that was prominent, causing recurring episodes of bursitis.   SURGEON:  Kerrin Champagne, M.D.   ASSISTANT:  Wende Neighbors, P.A.   ANESTHESIA:  GOT, Burna Forts, M.D.   ESTIMATED BLOOD LOSS:  5 mL.   DRAINS:  None.   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  This was done several years ago.  Surgery of the right side was  uneventful, the left side complicated by  postoperative subtrochanteric femur  fracture.  He was returned to the operating room at [facility] and underwent an  open reduction and internal fixation using a 90 degree compression screw  with side plate with multiple lag screws.  He has done well since then with  good recovery of ambulation function and diminished levels of pain in both  hips.  He presented to our office about three weeks ago with increasing  complaints of pain in the left hip, radiographic signs of prominent hardware  over the left trochanter consistent with a backing out or prominent  compression screw use for fixing the lag screw to the 90 degree side plate.  There is also one loosened screw in the midportion of the plate, both of  which are contributing to recurring episodes of left trochanteric bursitis.   INTRAOPERATIVE FINDINGS:  These screws represented prominent compression  screw proximally  used to compress the lag screw to the plate and a prominent  4.5 screw within the midportion of the plate.  These were removed  percutaneously.   DESCRIPTION OF PROCEDURE:  After adequate general anesthesia, the patient in  supine position, a standard preoperative antibiotic, standard prep with  Duraprep solution of the left lateral buttock down the lateral aspect of the  left thigh, draped in the usual manner, iodine Vi-Drape, the patient with a  bump over the left lower flank and upper buttock region, and the left leg  internally rotated.  A stab incision made directly in the line with the  patient's skin incision at the expected level of the tip of the greater  trochanter and the prominent compression screw present here.  C-arm  fluoroscopy used to guide the area of stab incision using a 10 blade  scalpel.  The incision approximately 2 cm in length through the skin and  subcu layers directly down to the screw and plate and the soft tissue spread  over the plate using a tonsil clamp and the screw head identified under C-   arm fluoroscopy with the clamp in place.  A 4.5 screwdriver then  approximating the compression screw proximally inserted into the hex opening  and then used to unscrew this compression screw that was attaching the lag  screw to the side plate.  The screw was able to be unwound most of the way  and then loss of its attachment to the screwdriver required slight extension  of the incision, approximately 0.5 cm, and insertion of the tonsil clamp  about the screw.  The screwdriver then able to be reattached to the head and  after loosening the screw completely and having it disengage from the lag  screw, a hemostat was able to be inserted and grasp the screw head and then  remove it from the 2.5 cm incision.  Next the C-arm fluoroscopy used to  identify the area just lateral to the loosening of a 4.5 screw in the center  portion of the plate, stab incision made with the 10 blade scalpel directly  lateral to this screw head.  Again tonsil clamp used to spread soft tissue  from the subcu layers down to the level of the loosened screw head.  A 4.5  screwdriver then placed over the screw head, inserted into the hex opening,  and then used to unscrew this screw.  This one was able to be passed out the  stab incision, grasped using a tonsil clamp, and removed.  Irrigation  performed.  The incisions then closed with interrupted 2-0 Vicryl sutures,  reapproximating the subcu layers with stainless steel staples approximating  the superficial layers.  Four by fours fixed to the skin with Hypafix tape.  Note that infiltration was performed in both of the incision sites using 18  mL total of 0.5% Marcaine plain.  The patient was then reactivated,  extubated, and returned to the recovery room in satisfactory condition.  All  instrument and sponge counts were correct.  Final C-arm views obtained documenting the screws having been removed from deep areas about the hip and  subcu layers.   POSTOPERATIVE CARE:   The patient may use crutches, weightbear as tolerated  in the left lower extremity, be seen back in the office in two weeks for  removal of staples, Vicodin for discomfort.  Kerrin Champagne, M.D.    Myra Rude  D:  04/08/2003  T:  04/09/2003  Job:  [id number]

## 2010-07-01 ENCOUNTER — Other Ambulatory Visit (HOSPITAL_COMMUNITY): Payer: Self-pay | Admitting: Specialist

## 2010-07-01 DIAGNOSIS — M25552 Pain in left hip: Secondary | ICD-10-CM

## 2010-07-12 ENCOUNTER — Encounter (HOSPITAL_COMMUNITY)
Admission: RE | Admit: 2010-07-12 | Discharge: 2010-07-12 | Disposition: A | Discharge status: Home / Self Care | Payer: Managed Care, Other (non HMO) | Source: Ambulatory Visit | Attending: Specialist | Admitting: Specialist

## 2010-07-12 ENCOUNTER — Encounter (HOSPITAL_COMMUNITY): Payer: Self-pay

## 2010-07-12 DIAGNOSIS — M25552 Pain in left hip: Secondary | ICD-10-CM

## 2010-07-12 DIAGNOSIS — M25559 Pain in unspecified hip: Secondary | ICD-10-CM | POA: Insufficient documentation

## 2010-07-12 HISTORY — DX: Presence of unspecified artificial hip joint: Z96.649

## 2010-07-12 MED ORDER — TECHNETIUM TC 99M MEDRONATE IV KIT
24.0000 | PACK | Freq: Once | INTRAVENOUS | Status: AC | PRN
  Administered 2010-07-12: 24 via INTRAVENOUS

## 2010-07-14 ENCOUNTER — Telehealth: Payer: Self-pay

## 2010-07-14 NOTE — Telephone Encounter (Signed)
VM - requesting refill on methadone , also requesting letter for insurance company indicating that the pt test blood sugars 2-3 times a day , this will help reduce the copay on needles and test strips.

## 2010-07-15 MED ORDER — METHADONE HCL 10 MG PO TABS: 10.0000 mg | ORAL_TABLET | ORAL | Status: DC

## 2010-07-15 NOTE — Telephone Encounter (Signed)
Attempt to call - [facility] mach - LMTCB if questions - rx ready for pick up and informed dr. Amador Cunas would not be able to do letter because once a day test id enough, not on mealtime insulin. KIK

## 2010-07-15 NOTE — Telephone Encounter (Signed)
Letter not appropriate since patient not on insulin and once the testing is adequate Will refill methadone

## 2010-08-10 ENCOUNTER — Encounter: Payer: Self-pay | Admitting: Internal Medicine

## 2010-08-17 ENCOUNTER — Telehealth: Payer: Self-pay

## 2010-08-17 ENCOUNTER — Encounter: Payer: Self-pay | Admitting: Internal Medicine

## 2010-08-17 ENCOUNTER — Ambulatory Visit (INDEPENDENT_AMBULATORY_CARE_PROVIDER_SITE_OTHER): Payer: Managed Care, Other (non HMO) | Admitting: Internal Medicine

## 2010-08-17 DIAGNOSIS — M1990 Unspecified osteoarthritis, unspecified site: Secondary | ICD-10-CM

## 2010-08-17 DIAGNOSIS — E119 Type 2 diabetes mellitus without complications: Secondary | ICD-10-CM

## 2010-08-17 DIAGNOSIS — E785 Hyperlipidemia, unspecified: Secondary | ICD-10-CM

## 2010-08-17 DIAGNOSIS — I10 Essential (primary) hypertension: Secondary | ICD-10-CM

## 2010-08-17 LAB — HEMOGLOBIN A1C: Hgb A1c MFr Bld: 7.1 % — ABNORMAL HIGH (ref 4.6–6.5)

## 2010-08-17 LAB — GLUCOSE, POCT (MANUAL RESULT ENTRY): POC Glucose: 196

## 2010-08-17 MED ORDER — METHADONE HCL 10 MG PO TABS: 10.0000 mg | ORAL_TABLET | ORAL | Status: DC

## 2010-08-17 MED ORDER — VARDENAFIL HCL 20 MG PO TABS: 20.0000 mg | ORAL_TABLET | ORAL | Status: DC | PRN

## 2010-08-17 NOTE — Telephone Encounter (Signed)
Pharmacy - [facility] [place]

## 2010-08-17 NOTE — Progress Notes (Signed)
  Subjective:    Patient ID: [name], male    DOB: [birth year], 55 y.o.   MRN: [id number]  HPI  55 year old patient who is seen today for followup. He has a history of type 2 diabetes. He feels that his blood sugars have been well-controlled. He usually checks blood sugars in the mid afternoon with readings in the 120 to 1:30 range. Recently he has received some steroid injections due to back and hip pain and a random blood sugar mid morning is 196. He has chronic low back pain and states that he is using methadone on the average of 2 times daily. He has advanced osteoarthritis He has treated hypertension which has been quite stable on his present regimen. He has dyslipidemia controlled on Lipitor. He denies any cardiopulmonary complaints.    Review of Systems  Constitutional: Negative for fever, chills, appetite change and fatigue.  HENT: Negative for hearing loss, ear pain, congestion, sore throat, trouble swallowing, neck stiffness, dental problem, voice change and tinnitus.   Eyes: Negative for pain, discharge and visual disturbance.  Respiratory: Negative for cough, chest tightness, wheezing and stridor.   Cardiovascular: Negative for chest pain, palpitations and leg swelling.  Gastrointestinal: Negative for nausea, vomiting, abdominal pain, diarrhea, constipation, blood in stool and abdominal distention.  Genitourinary: Negative for urgency, hematuria, flank pain, discharge, difficulty urinating and genital sores.  Musculoskeletal: Positive for back pain. Negative for myalgias, joint swelling, arthralgias and gait problem.  Skin: Negative for rash.  Neurological: Negative for dizziness, syncope, speech difficulty, weakness, numbness and headaches.  Hematological: Negative for adenopathy. Does not bruise/bleed easily.  Psychiatric/Behavioral: Negative for behavioral problems and dysphoric mood. The patient is not nervous/anxious.        Objective:   Physical Exam    Constitutional: He is oriented to person, place, and time. He appears well-developed.       Obese. Weight 258. Blood pressure 110/70  HENT:  Head: Normocephalic.  Right Ear: External ear normal.  Left Ear: External ear normal.  Eyes: Conjunctivae and EOM are normal.  Neck: Normal range of motion.  Cardiovascular: Normal rate and normal heart sounds.   Pulmonary/Chest: Breath sounds normal.  Abdominal: Bowel sounds are normal.  Musculoskeletal: Normal range of motion. He exhibits no edema and no tenderness.  Neurological: He is alert and oriented to person, place, and time.  Psychiatric: He has a normal mood and affect. His behavior is normal.          Assessment & Plan:   Diabetes mellitus. We'll check a hemoglobin A1c on last 2 visits it has increased from 6.6-7.5. Weight loss exercise are encouraged Hypertension. Well controlled Chronic low back pain. Methadone prescription refilled  Recheck 3 months

## 2010-08-17 NOTE — Patient Instructions (Signed)
You need to lose weight.  Consider a lower calorie diet and regular exercise.  Limit your sodium (Salt) intake  Please check your blood pressure on a regular basis.  If it is consistently greater than 150/90, please make an office appointment.   Please check your hemoglobin A1c every 3 months  Return in 3 months for follow-up

## 2010-10-05 LAB — BASIC METABOLIC PANEL
BUN: 6
BUN: 9
CO2: 27
CO2: 29
Calcium: 8.4
Calcium: 8.5
Chloride: 103
Chloride: 104
Creatinine, Ser: 0.65
Creatinine, Ser: 0.7
GFR calc Af Amer: >60
GFR calc Af Amer: >60
GFR calc non Af Amer: >60
GFR calc non Af Amer: >60
Glucose, Bld: 160 — ABNORMAL HIGH
Glucose, Bld: 178 — ABNORMAL HIGH
Potassium: 3.6
Potassium: 3.7
Sodium: 136
Sodium: 138

## 2010-10-05 LAB — GLUCOSE, CAPILLARY
Glucose-Capillary: 137 — ABNORMAL HIGH
Glucose-Capillary: 144 — ABNORMAL HIGH
Glucose-Capillary: 145 — ABNORMAL HIGH
Glucose-Capillary: 145 — ABNORMAL HIGH
Glucose-Capillary: 146 — ABNORMAL HIGH
Glucose-Capillary: 153 — ABNORMAL HIGH
Glucose-Capillary: 166 — ABNORMAL HIGH
Glucose-Capillary: 175 — ABNORMAL HIGH
Glucose-Capillary: 178 — ABNORMAL HIGH
Glucose-Capillary: 182 — ABNORMAL HIGH
Glucose-Capillary: 188 — ABNORMAL HIGH
Glucose-Capillary: 192 — ABNORMAL HIGH
Glucose-Capillary: 205 — ABNORMAL HIGH

## 2010-10-05 LAB — DIFFERENTIAL
Basophils Absolute: 0.1
Basophils Relative: 1
Eosinophils Absolute: 0.2
Eosinophils Relative: 3
Lymphocytes Relative: 44
Lymphs Abs: 3.4
Monocytes Absolute: 0.6
Monocytes Relative: 8
Neutro Abs: 3.5
Neutrophils Relative %: 45

## 2010-10-05 LAB — HEMOGLOBIN A1C
Hgb A1c MFr Bld: 7.4 — ABNORMAL HIGH
Mean Plasma Glucose: 166

## 2010-10-05 LAB — TYPE AND SCREEN
ABO/RH(D): A POS
Antibody Screen: NEGATIVE

## 2010-10-05 LAB — COMPREHENSIVE METABOLIC PANEL
ALT: 40
AST: 36
Albumin: 3.9
Alkaline Phosphatase: 60
BUN: 9
CO2: 30
Calcium: 9.4
Chloride: 101
Creatinine, Ser: 0.68
GFR calc Af Amer: >60
GFR calc non Af Amer: >60
Glucose, Bld: 113 — ABNORMAL HIGH
Potassium: 4
Sodium: 138
Total Bilirubin: 0.6
Total Protein: 6.5

## 2010-10-05 LAB — PROTIME-INR
INR: 0.9
INR: 1.1
INR: 1.9 — ABNORMAL HIGH
INR: 2.6 — ABNORMAL HIGH
Prothrombin Time: 12.5
Prothrombin Time: 14.8
Prothrombin Time: 23.2 — ABNORMAL HIGH
Prothrombin Time: 29.3 — ABNORMAL HIGH

## 2010-10-05 LAB — CBC
HCT: 37.2 — ABNORMAL LOW
HCT: 37.9 — ABNORMAL LOW
HCT: 39.1
HCT: 46.9
Hemoglobin: 12.7 — ABNORMAL LOW
Hemoglobin: 13.2
Hemoglobin: 13.3
Hemoglobin: 16.1
MCHC: 34
MCHC: 34.2
MCHC: 34.4
MCHC: 34.8
MCV: 87.2
MCV: 87.4
MCV: 89.3
MCV: 89.3
Platelets: 156
Platelets: 164
Platelets: 189
Platelets: 202
RBC: 4.16 — ABNORMAL LOW
RBC: 4.34
RBC: 4.37
RBC: 5.36
RDW: 13.1
RDW: 13.4
RDW: 13.7
RDW: 13.8
WBC: 12 — ABNORMAL HIGH
WBC: 12.1 — ABNORMAL HIGH
WBC: 7.7
WBC: 9.2

## 2010-10-05 LAB — URINALYSIS, ROUTINE W REFLEX MICROSCOPIC
Bilirubin Urine: NEGATIVE
Glucose, UA: NEGATIVE
Hgb urine dipstick: NEGATIVE
Ketones, ur: NEGATIVE
Nitrite: NEGATIVE
Protein, ur: NEGATIVE
Specific Gravity, Urine: 1.012
Urobilinogen, UA: 0.2
pH: 6.5

## 2010-10-05 LAB — APTT: aPTT: 31

## 2010-10-05 LAB — ABO/RH: ABO/RH(D): A POS

## 2010-10-14 ENCOUNTER — Other Ambulatory Visit: Payer: Self-pay | Admitting: *Deleted

## 2010-10-14 MED ORDER — METHADONE HCL 10 MG PO TABS: 10.0000 mg | ORAL_TABLET | ORAL | Status: DC

## 2010-10-14 NOTE — Telephone Encounter (Signed)
OK if not early 

## 2010-10-14 NOTE — Telephone Encounter (Signed)
Pt aware rx's ready for pick up in AM. KIK 

## 2010-10-14 NOTE — Telephone Encounter (Signed)
Pt is asking for a new prescription for Methadone.

## 2010-10-28 ENCOUNTER — Telehealth: Payer: Self-pay | Admitting: Internal Medicine

## 2010-10-28 NOTE — Telephone Encounter (Signed)
Pt requesting 90 day refills on Liraglutide (VICTOZA) 18 MG/3ML SOLN, atorvastatin (LIPITOR) 40 MG tablet ,glimepiride (AMARYL) 4 MG tablet and lisinopril-hydrochlorothiazide (PRINZIDE,ZESTORETIC) 20-25 MG per tablet     Please send to [facility]

## 2010-10-29 MED ORDER — LISINOPRIL-HYDROCHLOROTHIAZIDE 20-25 MG PO TABS: 1.0000 | ORAL_TABLET | Freq: Every day | ORAL | Status: DC

## 2010-10-29 MED ORDER — GLIMEPIRIDE 4 MG PO TABS: 4.0000 mg | ORAL_TABLET | Freq: Every day | ORAL | Status: DC | PRN

## 2010-10-29 MED ORDER — ATORVASTATIN CALCIUM 40 MG PO TABS: 40.0000 mg | ORAL_TABLET | Freq: Every day | ORAL | Status: DC

## 2010-10-29 MED ORDER — LIRAGLUTIDE 18 MG/3ML ~~LOC~~ SOLN: 6.0000 mg | Freq: Every day | SUBCUTANEOUS | Status: DC

## 2010-10-29 MED ORDER — INSULIN PEN NEEDLE 32G X 5 MM MISC: 1.0000 | Status: DC

## 2010-11-16 ENCOUNTER — Ambulatory Visit (INDEPENDENT_AMBULATORY_CARE_PROVIDER_SITE_OTHER): Payer: Managed Care, Other (non HMO) | Admitting: Internal Medicine

## 2010-11-16 ENCOUNTER — Encounter: Payer: Self-pay | Admitting: Internal Medicine

## 2010-11-16 DIAGNOSIS — M1990 Unspecified osteoarthritis, unspecified site: Secondary | ICD-10-CM

## 2010-11-16 DIAGNOSIS — Z23 Encounter for immunization: Secondary | ICD-10-CM

## 2010-11-16 DIAGNOSIS — Z0000 Encounter for general adult medical examination without abnormal findings: Secondary | ICD-10-CM

## 2010-11-16 DIAGNOSIS — E119 Type 2 diabetes mellitus without complications: Secondary | ICD-10-CM

## 2010-11-16 DIAGNOSIS — I10 Essential (primary) hypertension: Secondary | ICD-10-CM

## 2010-11-16 DIAGNOSIS — E785 Hyperlipidemia, unspecified: Secondary | ICD-10-CM

## 2010-11-16 LAB — GLUCOSE, POCT (MANUAL RESULT ENTRY): POC Glucose: 175

## 2010-11-16 LAB — HEMOGLOBIN A1C: Hgb A1c MFr Bld: 8.1 % — ABNORMAL HIGH (ref 4.6–6.5)

## 2010-11-16 LAB — HM DIABETES FOOT EXAM

## 2010-11-16 MED ORDER — METFORMIN HCL ER (OSM) 500 MG PO TB24: 500.0000 mg | ORAL_TABLET | Freq: Two times a day (BID) | ORAL | Status: DC

## 2010-11-16 MED ORDER — LIRAGLUTIDE 18 MG/3ML ~~LOC~~ SOLN: 6.0000 mg | Freq: Every day | SUBCUTANEOUS | Status: DC

## 2010-11-16 MED ORDER — METHOCARBAMOL 750 MG PO TABS: 750.0000 mg | ORAL_TABLET | Freq: Four times a day (QID) | ORAL | Status: DC | PRN

## 2010-11-16 MED ORDER — ATORVASTATIN CALCIUM 40 MG PO TABS: 40.0000 mg | ORAL_TABLET | Freq: Every day | ORAL | Status: DC

## 2010-11-16 MED ORDER — OMEPRAZOLE 20 MG PO CPDR: 20.0000 mg | DELAYED_RELEASE_CAPSULE | Freq: Every day | ORAL | Status: DC

## 2010-11-16 MED ORDER — LISINOPRIL-HYDROCHLOROTHIAZIDE 20-25 MG PO TABS: 1.0000 | ORAL_TABLET | Freq: Every day | ORAL | Status: DC

## 2010-11-16 MED ORDER — GLIMEPIRIDE 4 MG PO TABS: 4.0000 mg | ORAL_TABLET | Freq: Every day | ORAL | Status: DC | PRN

## 2010-11-16 MED ORDER — METHADONE HCL 10 MG PO TABS: 10.0000 mg | ORAL_TABLET | ORAL | Status: DC

## 2010-11-16 NOTE — Patient Instructions (Signed)
Limit your sodium (Salt) intake  Ophthalmology follow up    It is important that you exercise regularly, at least 20 minutes 3 to 4 times per week.  If you develop chest pain or shortness of breath seek  medical attention.  You need to lose weight.  Consider a lower calorie diet and regular exercise.   Please check your hemoglobin A1c every 3 months

## 2010-11-16 NOTE — Progress Notes (Signed)
  Subjective:    Patient ID: [name], male    DOB: 09-12-1955, 55 y.o.   MRN: [id number]  HPI  55 year old patient who is in today for followup of his type 2 diabetes there is been some modest weight gain since his last visit. Hemoglobin A1c is over the past 9 months 6.6 up to 7.5  6 months ago  and down to 7.13 months ago. He has a history of chronic low back pain and has been disabled since 1995. His wife recently lost her job and he will be losing his health benefits. He will be applied for a Medicare disability He has dyslipidemia untreated hypertension. Blood pressure has been well-controlled Diabetic regimen has been controlled on Victoza  Amaryl and metformin.    Review of Systems  Constitutional: Negative for fever, chills, appetite change and fatigue.  HENT: Negative for hearing loss, ear pain, congestion, sore throat, trouble swallowing, neck stiffness, dental problem, voice change and tinnitus.   Eyes: Negative for pain, discharge and visual disturbance.  Respiratory: Negative for cough, chest tightness, wheezing and stridor.   Cardiovascular: Negative for chest pain, palpitations and leg swelling.  Gastrointestinal: Negative for nausea, vomiting, abdominal pain, diarrhea, constipation, blood in stool and abdominal distention.  Genitourinary: Negative for urgency, hematuria, flank pain, discharge, difficulty urinating and genital sores.  Musculoskeletal: Positive for back pain, arthralgias and gait problem. Negative for myalgias and joint swelling.  Skin: Negative for rash.  Neurological: Negative for dizziness, syncope, speech difficulty, weakness, numbness and headaches.  Hematological: Negative for adenopathy. Does not bruise/bleed easily.  Psychiatric/Behavioral: Negative for behavioral problems and dysphoric mood. The patient is not nervous/anxious.        Objective:   Physical Exam  Constitutional: He is oriented to person, place, and time. He appears  well-developed.       Obese Blood pressure 120/70  HENT:  Head: Normocephalic.  Right Ear: External ear normal.  Left Ear: External ear normal.  Eyes: Conjunctivae and EOM are normal.  Neck: Normal range of motion.  Cardiovascular: Normal rate and normal heart sounds.   Pulmonary/Chest: Breath sounds normal.  Abdominal: Bowel sounds are normal.  Musculoskeletal: Normal range of motion. He exhibits no edema and no tenderness.  Neurological: He is alert and oriented to person, place, and time.  Psychiatric: He has a normal mood and affect. His behavior is normal.          Assessment & Plan:   Diabetes mellitus. Weight loss encouraged. Her hemoglobin A1c will be checked. All medications refilled Hypertension well controlled we'll continue present regimen Chronic low back pain on chronic narcotic analgesic. Methadone refilled presently he takes 2 or 3 daily  CPX in 3 months

## 2010-11-17 NOTE — Progress Notes (Signed)
Quick Note:  Pt aware - instructions given ______

## 2010-11-29 ENCOUNTER — Telehealth: Payer: Self-pay | Admitting: Family Medicine

## 2010-11-29 MED ORDER — LISINOPRIL-HYDROCHLOROTHIAZIDE 20-25 MG PO TABS: 1.0000 | ORAL_TABLET | Freq: Every day | ORAL | Status: DC

## 2010-11-29 MED ORDER — ATORVASTATIN CALCIUM 40 MG PO TABS: 40.0000 mg | ORAL_TABLET | Freq: Every day | ORAL | Status: DC

## 2010-11-29 MED ORDER — INSULIN PEN NEEDLE 32G X 5 MM MISC: 1.0000 | Status: DC

## 2010-11-29 MED ORDER — GLIMEPIRIDE 4 MG PO TABS: 4.0000 mg | ORAL_TABLET | Freq: Every day | ORAL | Status: DC | PRN

## 2010-11-29 MED ORDER — METFORMIN HCL ER (OSM) 500 MG PO TB24: 500.0000 mg | ORAL_TABLET | Freq: Two times a day (BID) | ORAL | Status: DC

## 2010-11-29 MED ORDER — METHOCARBAMOL 750 MG PO TABS: 750.0000 mg | ORAL_TABLET | Freq: Four times a day (QID) | ORAL | Status: DC | PRN

## 2010-11-29 MED ORDER — LIRAGLUTIDE 18 MG/3ML ~~LOC~~ SOLN: 6.0000 mg | Freq: Every day | SUBCUTANEOUS | Status: DC

## 2010-11-29 MED ORDER — OMEPRAZOLE 20 MG PO CPDR: 20.0000 mg | DELAYED_RELEASE_CAPSULE | Freq: Every day | ORAL | Status: DC

## 2010-11-29 NOTE — Telephone Encounter (Signed)
Pulled from Triage vmail. [facility]. His prescriptions are supposed to go through [facility] mail order pharm. Pt requesting we do this for all meds, for 90-day supplies. Thanks.

## 2010-11-29 NOTE — Telephone Encounter (Signed)
Faxed to [facility] 

## 2010-12-02 ENCOUNTER — Telehealth: Payer: Self-pay

## 2010-12-02 MED ORDER — LIRAGLUTIDE 18 MG/3ML ~~LOC~~ SOLN: SUBCUTANEOUS | Status: DC

## 2010-12-02 NOTE — Telephone Encounter (Signed)
Correction made and new rx faxed to [facility] at [phone]

## 2011-02-01 ENCOUNTER — Telehealth: Payer: Self-pay | Admitting: *Deleted

## 2011-02-01 MED ORDER — METHADONE HCL 10 MG PO TABS: 10.0000 mg | ORAL_TABLET | ORAL | Status: DC

## 2011-02-01 NOTE — Telephone Encounter (Signed)
Pt has lost insurance and is in the process of obtaining more.  Needs prescription for Methadone.

## 2011-02-01 NOTE — Telephone Encounter (Signed)
90

## 2011-02-01 NOTE — Telephone Encounter (Signed)
Rx printed.  Pt aware rx ready for pick up

## 2011-02-03 ENCOUNTER — Other Ambulatory Visit: Payer: Managed Care, Other (non HMO)

## 2011-02-10 ENCOUNTER — Encounter: Payer: Managed Care, Other (non HMO) | Admitting: Internal Medicine

## 2011-04-19 ENCOUNTER — Encounter: Payer: Self-pay | Admitting: Gastroenterology

## 2011-05-03 ENCOUNTER — Other Ambulatory Visit: Payer: Self-pay | Admitting: Internal Medicine

## 2011-05-03 MED ORDER — METHADONE HCL 10 MG PO TABS: 10.0000 mg | ORAL_TABLET | ORAL | Status: DC

## 2011-05-03 NOTE — Telephone Encounter (Signed)
90

## 2011-05-03 NOTE — Telephone Encounter (Signed)
rx ready for pickup 

## 2011-05-03 NOTE — Telephone Encounter (Signed)
appt  Next Thursday - last seen 11/16/10 3 mos rov  Last filled 02/01/11 #90 0RF Please advise

## 2011-05-03 NOTE — Telephone Encounter (Signed)
Pt is scheduled to come in 05/12/11 and is requesting a refill on methadone (DOLOPHINE) 10 MG tablet  Please contact when ready to pick up

## 2011-05-12 ENCOUNTER — Ambulatory Visit (INDEPENDENT_AMBULATORY_CARE_PROVIDER_SITE_OTHER): Payer: 59 | Admitting: Internal Medicine

## 2011-05-12 ENCOUNTER — Encounter: Payer: Self-pay | Admitting: Internal Medicine

## 2011-05-12 VITALS — BP 118/72 | Temp 98.1°F | Wt 259.0 lb

## 2011-05-12 DIAGNOSIS — E119 Type 2 diabetes mellitus without complications: Secondary | ICD-10-CM

## 2011-05-12 DIAGNOSIS — I10 Essential (primary) hypertension: Secondary | ICD-10-CM

## 2011-05-12 DIAGNOSIS — M1990 Unspecified osteoarthritis, unspecified site: Secondary | ICD-10-CM

## 2011-05-12 DIAGNOSIS — E785 Hyperlipidemia, unspecified: Secondary | ICD-10-CM

## 2011-05-12 LAB — HEMOGLOBIN A1C: Hgb A1c MFr Bld: 7.6 % — ABNORMAL HIGH (ref 4.6–6.5)

## 2011-05-12 MED ORDER — METFORMIN HCL ER (OSM) 500 MG PO TB24: 500.0000 mg | ORAL_TABLET | Freq: Two times a day (BID) | ORAL | Status: DC

## 2011-05-12 MED ORDER — GLIMEPIRIDE 4 MG PO TABS: 4.0000 mg | ORAL_TABLET | Freq: Every day | ORAL | Status: DC | PRN

## 2011-05-12 MED ORDER — LIRAGLUTIDE 18 MG/3ML ~~LOC~~ SOLN: SUBCUTANEOUS | Status: DC

## 2011-05-12 NOTE — Progress Notes (Signed)
  Subjective:    Patient ID: [name], male    DOB: 07-30-1955, 56 y.o.   MRN: [id number]  HPI  56 year old patient who is seen today for followup. He has not been seen here in 6 months he has a history of type 2 diabetes. Due to GI concerns he has discontinued Amaryl and metformin. At the present time he is using Victoza 1.2 mg twice daily. He has treated hypertension and dyslipidemia. He has a history of osteoarthritis with chronic pain and uses methadone once or twice daily. His GI complaints have resolved. There's been some modest weight loss.   Wt Readings from Last 3 Encounters:  05/12/11 259 lb (117.482 kg)  11/16/10 263 lb (119.296 kg)  08/17/10 258 lb (117.028 kg)    Review of Systems  Constitutional: Negative for fever, chills, appetite change and fatigue.  HENT: Negative for hearing loss, ear pain, congestion, sore throat, trouble swallowing, neck stiffness, dental problem, voice change and tinnitus.   Eyes: Negative for pain, discharge and visual disturbance.  Respiratory: Negative for cough, chest tightness, wheezing and stridor.   Cardiovascular: Negative for chest pain, palpitations and leg swelling.  Gastrointestinal: Negative for nausea, vomiting, abdominal pain, diarrhea, constipation, blood in stool and abdominal distention.  Genitourinary: Negative for urgency, hematuria, flank pain, discharge, difficulty urinating and genital sores.  Musculoskeletal: Positive for back pain and arthralgias. Negative for myalgias, joint swelling and gait problem.  Skin: Negative for rash.  Neurological: Negative for dizziness, syncope, speech difficulty, weakness, numbness and headaches.  Hematological: Negative for adenopathy. Does not bruise/bleed easily.  Psychiatric/Behavioral: Negative for behavioral problems and dysphoric mood. The patient is not nervous/anxious.        Objective:   Physical Exam  Constitutional: He is oriented to person, place, and time. He appears  well-developed.       Obese. Weight 259. Blood pressure 118/72    HENT:  Head: Normocephalic.  Right Ear: External ear normal.  Left Ear: External ear normal.  Eyes: Conjunctivae and EOM are normal.  Neck: Normal range of motion.  Cardiovascular: Normal rate and normal heart sounds.   Pulmonary/Chest: Breath sounds normal.  Abdominal: Bowel sounds are normal.  Musculoskeletal: Normal range of motion. He exhibits no edema and no tenderness.  Neurological: He is alert and oriented to person, place, and time.  Psychiatric: He has a normal mood and affect. His behavior is normal.          Assessment & Plan:   Diabetes mellitus. We'll change Victoza to 1.8 mg daily at bedtime. We'll resume metformin at 500 mg increments. We'll also resume Amaryl one half tablet daily Hypertension well controlled Obesity weight loss encouraged Osteoarthritis Dyslipidemia will continue Lipitor   We'll check a hemoglobin A1c today CPX in 3 months

## 2011-05-12 NOTE — Patient Instructions (Signed)
Please check your hemoglobin A1c every 3 months    It is important that you exercise regularly, at least 20 minutes 3 to 4 times per week.  If you develop chest pain or shortness of breath seek  medical attention.  You need to lose weight.  Consider a lower calorie diet and regular exercise. 

## 2011-07-06 DIAGNOSIS — M25559 Pain in unspecified hip: Secondary | ICD-10-CM | POA: Diagnosis not present

## 2011-07-06 DIAGNOSIS — M169 Osteoarthritis of hip, unspecified: Secondary | ICD-10-CM | POA: Diagnosis not present

## 2011-07-12 ENCOUNTER — Other Ambulatory Visit: Payer: Self-pay | Admitting: Internal Medicine

## 2011-07-12 MED ORDER — METHADONE HCL 10 MG PO TABS: 10.0000 mg | ORAL_TABLET | ORAL | Status: DC

## 2011-07-12 NOTE — Telephone Encounter (Signed)
Please advise 

## 2011-07-12 NOTE — Telephone Encounter (Signed)
Attempt to call - VM - LMTCB if questions - rx's ready for pick up 

## 2011-07-12 NOTE — Telephone Encounter (Signed)
Ok #90

## 2011-07-12 NOTE — Telephone Encounter (Signed)
Pt needs new rx methadone. Pt has about 1 wk left of medication.

## 2011-07-13 DIAGNOSIS — M25559 Pain in unspecified hip: Secondary | ICD-10-CM | POA: Diagnosis not present

## 2011-07-13 DIAGNOSIS — M169 Osteoarthritis of hip, unspecified: Secondary | ICD-10-CM | POA: Diagnosis not present

## 2011-08-05 ENCOUNTER — Other Ambulatory Visit: Payer: 59

## 2011-08-12 ENCOUNTER — Encounter: Payer: 59 | Admitting: Internal Medicine

## 2011-08-12 ENCOUNTER — Other Ambulatory Visit (INDEPENDENT_AMBULATORY_CARE_PROVIDER_SITE_OTHER): Payer: Medicare Other

## 2011-08-12 DIAGNOSIS — E119 Type 2 diabetes mellitus without complications: Secondary | ICD-10-CM | POA: Diagnosis not present

## 2011-08-12 DIAGNOSIS — Z129 Encounter for screening for malignant neoplasm, site unspecified: Secondary | ICD-10-CM

## 2011-08-12 DIAGNOSIS — Z0000 Encounter for general adult medical examination without abnormal findings: Secondary | ICD-10-CM

## 2011-08-12 LAB — CBC WITH DIFFERENTIAL/PLATELET
Basophils Absolute: 0 10*3/uL (ref 0.0–0.1)
Basophils Relative: 0.5 % (ref 0.0–3.0)
Eosinophils Absolute: 0.2 10*3/uL (ref 0.0–0.7)
Eosinophils Relative: 2.4 % (ref 0.0–5.0)
HCT: 47.1 % (ref 39.0–52.0)
Hemoglobin: 15.8 g/dL (ref 13.0–17.0)
Lymphocytes Relative: 38.8 % (ref 12.0–46.0)
Lymphs Abs: 2.7 10*3/uL (ref 0.7–4.0)
MCHC: 33.5 g/dL (ref 30.0–36.0)
MCV: 87.2 fl (ref 78.0–100.0)
Monocytes Absolute: 0.5 10*3/uL (ref 0.1–1.0)
Monocytes Relative: 7.5 % (ref 3.0–12.0)
Neutro Abs: 3.5 10*3/uL (ref 1.4–7.7)
Neutrophils Relative %: 50.8 % (ref 43.0–77.0)
Platelets: 176 10*3/uL (ref 150.0–400.0)
RBC: 5.4 Mil/uL (ref 4.22–5.81)
RDW: 15.8 % — ABNORMAL HIGH (ref 11.5–14.6)
WBC: 6.9 10*3/uL (ref 4.5–10.5)

## 2011-08-12 LAB — HEPATIC FUNCTION PANEL
ALT: 41 U/L (ref 0–53)
AST: 28 U/L (ref 0–37)
Albumin: 3.9 g/dL (ref 3.5–5.2)
Alkaline Phosphatase: 75 U/L (ref 39–117)
Bilirubin, Direct: 0.1 mg/dL (ref 0.0–0.3)
Total Bilirubin: 0.6 mg/dL (ref 0.3–1.2)
Total Protein: 7 g/dL (ref 6.0–8.3)

## 2011-08-12 LAB — BASIC METABOLIC PANEL
BUN: 20 mg/dL (ref 6–23)
CO2: 27 mEq/L (ref 19–32)
Calcium: 9 mg/dL (ref 8.4–10.5)
Chloride: 94 mEq/L — ABNORMAL LOW (ref 96–112)
Creatinine, Ser: 0.7 mg/dL (ref 0.4–1.5)
GFR: 121.13 mL/min (ref >60.00)
Glucose, Bld: 198 mg/dL — ABNORMAL HIGH (ref 70–99)
Potassium: 4.1 mEq/L (ref 3.5–5.1)
Sodium: 131 mEq/L — ABNORMAL LOW (ref 135–145)

## 2011-08-12 LAB — LIPID PANEL
Cholesterol: 298 mg/dL — ABNORMAL HIGH (ref 0–200)
HDL: 30.3 mg/dL — ABNORMAL LOW (ref >39.00)
Total CHOL/HDL Ratio: 10
Triglycerides: 1107 mg/dL — ABNORMAL HIGH (ref 0.0–149.0)
VLDL: 221.4 mg/dL — ABNORMAL HIGH (ref 0.0–40.0)

## 2011-08-12 LAB — POCT URINALYSIS DIPSTICK
Bilirubin, UA: NEGATIVE
Leukocytes, UA: NEGATIVE
Nitrite, UA: NEGATIVE
Protein, UA: NEGATIVE
Spec Grav, UA: 1.025
Urobilinogen, UA: 0.2
pH, UA: 6

## 2011-08-12 LAB — LDL CHOLESTEROL, DIRECT: Direct LDL: 94.6 mg/dL

## 2011-08-12 LAB — PSA: PSA: 0.14 ng/mL (ref 0.10–4.00)

## 2011-08-12 LAB — HEMOGLOBIN A1C: Hgb A1c MFr Bld: 8.7 % — ABNORMAL HIGH (ref 4.6–6.5)

## 2011-08-12 LAB — TSH: TSH: 1.78 u[IU]/mL (ref 0.35–5.50)

## 2011-08-18 ENCOUNTER — Other Ambulatory Visit: Payer: 59

## 2011-08-18 DIAGNOSIS — M48061 Spinal stenosis, lumbar region without neurogenic claudication: Secondary | ICD-10-CM | POA: Diagnosis not present

## 2011-08-18 DIAGNOSIS — M19049 Primary osteoarthritis, unspecified hand: Secondary | ICD-10-CM | POA: Diagnosis not present

## 2011-08-18 DIAGNOSIS — M719 Bursopathy, unspecified: Secondary | ICD-10-CM | POA: Diagnosis not present

## 2011-08-18 DIAGNOSIS — M67919 Unspecified disorder of synovium and tendon, unspecified shoulder: Secondary | ICD-10-CM | POA: Diagnosis not present

## 2011-08-19 ENCOUNTER — Other Ambulatory Visit: Payer: Self-pay | Admitting: Specialist

## 2011-08-19 DIAGNOSIS — M25511 Pain in right shoulder: Secondary | ICD-10-CM

## 2011-08-25 ENCOUNTER — Ambulatory Visit (INDEPENDENT_AMBULATORY_CARE_PROVIDER_SITE_OTHER): Payer: Medicare Other | Admitting: Internal Medicine

## 2011-08-25 ENCOUNTER — Encounter: Payer: Self-pay | Admitting: Internal Medicine

## 2011-08-25 VITALS — BP 142/98 | HR 90 | Temp 98.3°F | Ht 72.0 in | Wt 260.0 lb

## 2011-08-25 DIAGNOSIS — E119 Type 2 diabetes mellitus without complications: Secondary | ICD-10-CM | POA: Diagnosis not present

## 2011-08-25 DIAGNOSIS — Z0000 Encounter for general adult medical examination without abnormal findings: Secondary | ICD-10-CM

## 2011-08-25 DIAGNOSIS — M1990 Unspecified osteoarthritis, unspecified site: Secondary | ICD-10-CM

## 2011-08-25 DIAGNOSIS — E785 Hyperlipidemia, unspecified: Secondary | ICD-10-CM

## 2011-08-25 DIAGNOSIS — I10 Essential (primary) hypertension: Secondary | ICD-10-CM | POA: Diagnosis not present

## 2011-08-25 LAB — POCT CBG (FASTING - GLUCOSE)-MANUAL ENTRY: Glucose Fasting, POC: 191 mg/dL — AB (ref 70–99)

## 2011-08-25 MED ORDER — LIRAGLUTIDE 18 MG/3ML ~~LOC~~ SOLN: SUBCUTANEOUS | Status: DC

## 2011-08-25 MED ORDER — GLIMEPIRIDE 4 MG PO TABS: 4.0000 mg | ORAL_TABLET | Freq: Every day | ORAL | Status: DC | PRN

## 2011-08-25 MED ORDER — METHOCARBAMOL 750 MG PO TABS: 750.0000 mg | ORAL_TABLET | Freq: Four times a day (QID) | ORAL | Status: DC | PRN

## 2011-08-25 MED ORDER — METFORMIN HCL 1000 MG PO TABS: 1000.0000 mg | ORAL_TABLET | Freq: Two times a day (BID) | ORAL | Status: DC

## 2011-08-25 MED ORDER — ATORVASTATIN CALCIUM 40 MG PO TABS: 40.0000 mg | ORAL_TABLET | Freq: Every day | ORAL | Status: DC

## 2011-08-25 MED ORDER — METHADONE HCL 10 MG PO TABS: 10.0000 mg | ORAL_TABLET | ORAL | Status: DC

## 2011-08-25 MED ORDER — OMEPRAZOLE 20 MG PO CPDR: 20.0000 mg | DELAYED_RELEASE_CAPSULE | Freq: Every day | ORAL | Status: DC

## 2011-08-25 MED ORDER — PIOGLITAZONE HCL 45 MG PO TABS: 45.0000 mg | ORAL_TABLET | Freq: Every day | ORAL | Status: DC

## 2011-08-25 MED ORDER — LISINOPRIL-HYDROCHLOROTHIAZIDE 20-25 MG PO TABS: 1.0000 | ORAL_TABLET | Freq: Every day | ORAL | Status: DC

## 2011-08-25 NOTE — Progress Notes (Signed)
Subjective:    Patient ID: [name], male    DOB: [birth year], 56 y.o.   MRN: [id number]  HPI  56 year old patient who is seen today for a wellness exam. Medical problems include type 2 diabetes. Blood sugars have been elevated recently due to 2 multiple recent cortisone injections. He has hypertension and dyslipidemia. He has a history of advanced osteoarthritis and is status post multiple orthopedic procedures. He has chronic back and shoulder pain. He has history colonic polyps dyslipidemia. He has gastroesophageal reflux disease which has been stable.    Past Medical History  Diagnosis Date  . History of hip replacement, total   . Diabetes mellitus   . Hypertension   . Hyperlipidemia   . GERD (gastroesophageal reflux disease)   . Obesity   . Chronic pain   . History of colonic polyps   . RBBB (right bundle branch block with left anterior fascicular block)   . 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 Left anterior hemiblock   . Adenomatous polyp     History   Social History  . Marital Status: Married    Spouse Name: N/A    Number of Children: N/A  . Years of Education: N/A   Occupational History  . Not on file.   Social History Main Topics  . Smoking status: Former Smoker    Quit date: 01/04/2007  . Smokeless tobacco: Not on file  . Alcohol Use: Not on file     recovering alcoholic  . Drug Use: Not on file  . Sexually Active: Not on file   Other Topics Concern  . Not on file   Social History Narrative   Recovering alcoholicMinister     Past Surgical History  Procedure Date  . Hip surgery 12/29/09    R THR  (Nitka)  . Appendectomy   . Foot surgery   . Cervical laminectomy   . Cervical fusion   . Hip surgery     bilateral  . Total knee arthroplasty 01/2008    left knee    Family History  Problem Relation Age of Onset  . Cancer Mother     lung  . Crohn's disease Father   . Alcohol abuse Brother     Allergies  Allergen  Reactions  . Aspirin     REACTION: unspecified  . Codeine     REACTION: itch  . Naproxen     REACTION: anaphylaxis    Current Outpatient Prescriptions on File Prior to Visit  Medication Sig Dispense Refill  . atorvastatin (LIPITOR) 40 MG tablet Take 1 tablet (40 mg total) by mouth daily.  90 tablet  3  . diclofenac sodium (VOLTAREN) 1 % GEL Apply topically.        Marland Kitchen glimepiride (AMARYL) 4 MG tablet Take 1 tablet (4 mg total) by mouth daily as needed.  90 tablet  3  . Insulin Pen Needle (NOVOTWIST) 32G X 5 MM MISC 1 each by Does not apply route as directed.  100 each  3  . Liraglutide (VICTOZA) 18 MG/3ML SOLN Inject 0.6mg  daily for 1 week then 1.2 mg daily for 1 week then 1.8 mg daily as directed  6 mL  6  . lisinopril-hydrochlorothiazide (PRINZIDE,ZESTORETIC) 20-25 MG per tablet Take 1 tablet by mouth daily.  90 tablet  3  . metformin (FORTAMET) 500 MG (OSM) 24 hr tablet Take 1 tablet (500 mg total) by mouth 2 (two) times daily with a meal.  60 tablet  11  . methadone (DOLOPHINE) 10 MG tablet Take 1 tablet (10 mg total) by mouth as directed. 1-4 by mouth daily  90 tablet  0  . methocarbamol (ROBAXIN) 750 MG tablet Take 1 tablet (750 mg total) by mouth every 6 (six) hours as needed.  90 tablet  4  . mupirocin (BACTROBAN) 2 % ointment Apply topically as directed.        Marland Kitchen omeprazole (PRILOSEC) 20 MG capsule Take 1 capsule (20 mg total) by mouth daily.  90 capsule  4  . promethazine (PHENERGAN) 25 MG tablet Take 25 mg by mouth every 6 (six) hours as needed. As needed for nausea       . DISCONTD: vardenafil (LEVITRA) 20 MG tablet Take 1 tablet (20 mg total) by mouth as needed for erectile dysfunction.  20 tablet  1    BP 142/98  Pulse 90  Temp 98.3 F (36.8 C) (Oral)  Ht 6' (1.829 m)  Wt 260 lb (117.935 kg)  BMI 35.26 kg/m2  SpO2 96%    1. Risk factors, based on past  M,S,F history- cardiovascular risk factors include dyslipidemia and hypertension. Father died complications of  ischemic bowel  2.  Physical activities: Limited somewhat due to orthopedic limitations and obesity  3.  Depression/mood: No history depression or mood disorder  4.  Hearing: No significant deficits  5.  ADL's: Independent in all aspects of daily living  6.  Fall risk: Moderate degenerative obesity and orthopedic issues  7.  Home safety: No problems identified  8.  Height weight, and visual acuity; height and weight stable no change in visual acuity. Annual eye examination recommended  9.  Counseling: Annual eye examination. Followup orthopedics  10. Lab orders based on risk factors: Laboratory studies reviewed hemoglobin A1c not well controlled  11. Referral : Orthopedic referral  12. Care plan: Lifestyle issues discussed and encouraged  13. Cognitive assessment: Alert and oriented with normal affect. No cognitive dysfunction       Review of Systems  Constitutional: Negative for fever, chills, activity change, appetite change and fatigue.  HENT: Negative for hearing loss, ear pain, congestion, rhinorrhea, sneezing, mouth sores, trouble swallowing, neck pain, neck stiffness, dental problem, voice change, sinus pressure and tinnitus.   Eyes: Negative for photophobia, pain, redness and visual disturbance.  Respiratory: Negative for apnea, cough, choking, chest tightness, shortness of breath and wheezing.   Cardiovascular: Negative for chest pain, palpitations and leg swelling.  Gastrointestinal: Negative for nausea, vomiting, abdominal pain, diarrhea, constipation, blood in stool, abdominal distention, anal bleeding and rectal pain.  Genitourinary: Negative for dysuria, urgency, frequency, hematuria, flank pain, decreased urine volume, discharge, penile swelling, scrotal swelling, difficulty urinating, genital sores and testicular pain.  Musculoskeletal: Positive for back pain and arthralgias. Negative for myalgias, joint swelling and gait problem.  Skin: Negative for color  change, rash and wound.  Neurological: Negative for dizziness, tremors, seizures, syncope, facial asymmetry, speech difficulty, weakness, light-headedness, numbness and headaches.  Hematological: Negative for adenopathy. Does not bruise/bleed easily.  Psychiatric/Behavioral: Negative for suicidal ideas, hallucinations, behavioral problems, confusion, disturbed wake/sleep cycle, self-injury, dysphoric mood, decreased concentration and agitation. The patient is not nervous/anxious.        Objective:   Physical Exam  Constitutional: He appears well-developed and well-nourished. No distress.       Obese blood pressure 130/80   HENT:  Head: Normocephalic and atraumatic.  Right Ear: External ear normal.  Left Ear: External ear normal.  Nose: Nose  normal.  Mouth/Throat: Oropharynx is clear and moist.  Eyes: Conjunctivae and EOM are normal. Pupils are equal, round, and reactive to light. No scleral icterus.  Neck: Normal range of motion. Neck supple. No JVD present. No thyromegaly present.  Cardiovascular: Normal rate, regular rhythm, normal heart sounds and intact distal pulses.  Exam reveals no gallop and no friction rub.   No murmur heard. Pulmonary/Chest: Effort normal and breath sounds normal. He exhibits no tenderness.  Abdominal: Soft. Bowel sounds are normal. He exhibits no distension and no mass. There is no tenderness.  Genitourinary: Rectum normal and prostate normal. Guaiac negative stool. Penile tenderness present.  Musculoskeletal: Normal range of motion. He exhibits no edema and no tenderness.  Lymphadenopathy:    He has no cervical adenopathy.  Neurological: He is alert. He has normal reflexes. No cranial nerve deficit. Coordination normal.  Skin: Skin is warm and dry. No rash noted.  Psychiatric: He has a normal mood and affect. His behavior is normal.          Assessment & Plan:   Preventive health examination Diabetes mellitus suboptimal control. Actos will be added  to his regimen. Lifestyle issues encouraged may need insulin therapy if hemoglobin A1c does not improve Hypertension stable Osteoarthritis Dyslipidemia  Orthopedic followup Medications refilled Recheck 3 months

## 2011-08-25 NOTE — Patient Instructions (Signed)
Limit your sodium (Salt) intake   Please check your hemoglobin A1c every 3 months  You need to lose weight.  Consider a lower calorie diet and regular exercise.  Please check your blood pressure on a regular basis.  If it is consistently greater than 150/90, please make an office appointment.   

## 2011-09-23 ENCOUNTER — Ambulatory Visit
Admission: RE | Admit: 2011-09-23 | Discharge: 2011-09-23 | Disposition: A | Discharge status: Home / Self Care | Payer: 59 | Source: Ambulatory Visit | Attending: Specialist | Admitting: Specialist

## 2011-09-23 ENCOUNTER — Ambulatory Visit
Admission: RE | Admit: 2011-09-23 | Discharge: 2011-09-23 | Disposition: A | Discharge status: Home / Self Care | Payer: Medicare Other | Source: Ambulatory Visit | Attending: Specialist | Admitting: Specialist

## 2011-09-23 ENCOUNTER — Other Ambulatory Visit: Payer: Medicare Other

## 2011-09-23 DIAGNOSIS — Z1389 Encounter for screening for other disorder: Secondary | ICD-10-CM | POA: Diagnosis not present

## 2011-09-23 DIAGNOSIS — M25511 Pain in right shoulder: Secondary | ICD-10-CM

## 2011-09-23 DIAGNOSIS — M25519 Pain in unspecified shoulder: Secondary | ICD-10-CM | POA: Diagnosis not present

## 2011-09-29 ENCOUNTER — Telehealth: Payer: Self-pay | Admitting: Internal Medicine

## 2011-09-29 DIAGNOSIS — M67919 Unspecified disorder of synovium and tendon, unspecified shoulder: Secondary | ICD-10-CM | POA: Diagnosis not present

## 2011-09-29 DIAGNOSIS — M719 Bursopathy, unspecified: Secondary | ICD-10-CM | POA: Diagnosis not present

## 2011-09-29 DIAGNOSIS — M25519 Pain in unspecified shoulder: Secondary | ICD-10-CM | POA: Diagnosis not present

## 2011-09-29 NOTE — Telephone Encounter (Signed)
Ok for procedure

## 2011-09-29 NOTE — Telephone Encounter (Signed)
Caller: [name]/Patient; Phone: [phone]; Reason for Call: Patient calling because his Orthopaedic doctor wants patient to have an Arthrogram with dye done.  Patient wanted to check with Dr.  Amador Cunas to see if he is clear to have this done.  Please call him back.  Thanks

## 2011-09-29 NOTE — Telephone Encounter (Signed)
Spoke with [name]- informed of dr. Vernon Prey advise

## 2011-09-29 NOTE — Telephone Encounter (Signed)
Please call/advise

## 2011-09-30 ENCOUNTER — Other Ambulatory Visit: Payer: Self-pay | Admitting: Orthopedic Surgery

## 2011-09-30 DIAGNOSIS — M25512 Pain in left shoulder: Secondary | ICD-10-CM

## 2011-10-07 ENCOUNTER — Other Ambulatory Visit: Payer: 59

## 2011-10-13 ENCOUNTER — Other Ambulatory Visit: Payer: 59

## 2011-11-03 DIAGNOSIS — M19049 Primary osteoarthritis, unspecified hand: Secondary | ICD-10-CM | POA: Diagnosis not present

## 2011-11-07 DIAGNOSIS — M25559 Pain in unspecified hip: Secondary | ICD-10-CM | POA: Diagnosis not present

## 2011-11-28 ENCOUNTER — Encounter: Payer: Self-pay | Admitting: Internal Medicine

## 2011-11-28 ENCOUNTER — Ambulatory Visit (INDEPENDENT_AMBULATORY_CARE_PROVIDER_SITE_OTHER): Payer: Medicare Other | Admitting: Internal Medicine

## 2011-11-28 VITALS — BP 150/88 | HR 80 | Temp 98.2°F | Resp 16 | Wt 264.0 lb

## 2011-11-28 DIAGNOSIS — M1990 Unspecified osteoarthritis, unspecified site: Secondary | ICD-10-CM

## 2011-11-28 DIAGNOSIS — E785 Hyperlipidemia, unspecified: Secondary | ICD-10-CM | POA: Diagnosis not present

## 2011-11-28 DIAGNOSIS — Z23 Encounter for immunization: Secondary | ICD-10-CM

## 2011-11-28 DIAGNOSIS — I10 Essential (primary) hypertension: Secondary | ICD-10-CM

## 2011-11-28 DIAGNOSIS — E119 Type 2 diabetes mellitus without complications: Secondary | ICD-10-CM

## 2011-11-28 LAB — GLUCOSE, POCT (MANUAL RESULT ENTRY): POC Glucose: 162 mg/dl — AB (ref 70–99)

## 2011-11-28 LAB — HEMOGLOBIN A1C: Hgb A1c MFr Bld: 7.7 % — ABNORMAL HIGH (ref 4.6–6.5)

## 2011-11-28 MED ORDER — PIOGLITAZONE HCL 45 MG PO TABS: 45.0000 mg | ORAL_TABLET | Freq: Every day | ORAL | Status: DC

## 2011-11-28 MED ORDER — METHADONE HCL 10 MG PO TABS: 10.0000 mg | ORAL_TABLET | ORAL | Status: DC

## 2011-11-28 MED ORDER — METFORMIN HCL 1000 MG PO TABS: 1000.0000 mg | ORAL_TABLET | Freq: Two times a day (BID) | ORAL | Status: DC

## 2011-11-28 MED ORDER — GLIMEPIRIDE 4 MG PO TABS: 4.0000 mg | ORAL_TABLET | Freq: Every day | ORAL | Status: DC | PRN

## 2011-11-28 MED ORDER — METHOCARBAMOL 750 MG PO TABS: 750.0000 mg | ORAL_TABLET | Freq: Four times a day (QID) | ORAL | Status: DC | PRN

## 2011-11-28 MED ORDER — OMEPRAZOLE 20 MG PO CPDR: 20.0000 mg | DELAYED_RELEASE_CAPSULE | Freq: Every day | ORAL | Status: DC

## 2011-11-28 MED ORDER — LIRAGLUTIDE 18 MG/3ML ~~LOC~~ SOLN: SUBCUTANEOUS | Status: DC

## 2011-11-28 MED ORDER — ONDANSETRON 4 MG PO TBDP: 4.0000 mg | ORAL_TABLET | Freq: Three times a day (TID) | ORAL | Status: DC | PRN

## 2011-11-28 MED ORDER — LISINOPRIL-HYDROCHLOROTHIAZIDE 20-25 MG PO TABS: 1.0000 | ORAL_TABLET | Freq: Every day | ORAL | Status: DC

## 2011-11-28 MED ORDER — ATORVASTATIN CALCIUM 40 MG PO TABS: 40.0000 mg | ORAL_TABLET | Freq: Every day | ORAL | Status: DC

## 2011-11-28 NOTE — Patient Instructions (Signed)
Limit your sodium (Salt) intake    It is important that you exercise regularly, at least 20 minutes 3 to 4 times per week.  If you develop chest pain or shortness of breath seek  medical attention.  You need to lose weight.  Consider a lower calorie diet and regular exercise.   Please check your hemoglobin A1c every 3 months   

## 2011-11-28 NOTE — Progress Notes (Signed)
Subjective:    Patient ID: [name], male    DOB: Aug 09, 1955, 56 y.o.   MRN: [id number]  HPI  56 year old patient who is seen today for followup of his type 2 diabetes. Medical regimen includes Victoza which he is tolerating that poorly do to nausea. His highest tolerated dose is 0.8 mg per day. Last hemoglobin A1c was greater than 8. There's been some modest weight gain since his last visit here History of hypertension which has been well-controlled. He has exogenous obesity He has chronic low back pain and osteoarthritis. He takes methadone easily once per day. Since his last visit here he has had another epidural.  Past Medical History  Diagnosis Date  . History of hip replacement, total   . Diabetes mellitus   . Hypertension   . Hyperlipidemia   . GERD (gastroesophageal reflux disease)   . Obesity   . Chronic pain   . History of colonic polyps   . RBBB (right bundle branch block with left anterior fascicular block)   . 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 Left anterior hemiblock   . Adenomatous polyp     History   Social History  . Marital Status: Married    Spouse Name: N/A    Number of Children: N/A  . Years of Education: N/A   Occupational History  . Not on file.   Social History Main Topics  . Smoking status: Former Smoker    Quit date: 01/04/2007  . Smokeless tobacco: Not on file  . Alcohol Use: Not on file     Comment: recovering alcoholic  . Drug Use: Not on file  . Sexually Active: Not on file   Other Topics Concern  . Not on file   Social History Narrative   Recovering alcoholicMinister     Past Surgical History  Procedure Date  . Hip surgery 12/29/09    R THR  (Nitka)  . Appendectomy   . Foot surgery   . Cervical laminectomy   . Cervical fusion   . Hip surgery     bilateral  . Total knee arthroplasty 01/2008    left knee    Family History  Problem Relation Age of Onset  . Cancer Mother     lung  . Crohn's  disease Father   . Alcohol abuse Brother     Allergies  Allergen Reactions  . Aspirin     REACTION: unspecified  . Codeine     REACTION: itch  . Naproxen     REACTION: anaphylaxis    Current Outpatient Prescriptions on File Prior to Visit  Medication Sig Dispense Refill  . atorvastatin (LIPITOR) 40 MG tablet Take 1 tablet (40 mg total) by mouth daily.  90 tablet  3  . diclofenac sodium (VOLTAREN) 1 % GEL Apply topically.        Marland Kitchen glimepiride (AMARYL) 4 MG tablet Take 1 tablet (4 mg total) by mouth daily as needed.  90 tablet  3  . Insulin Pen Needle (NOVOTWIST) 32G X 5 MM MISC 1 each by Does not apply route as directed.  100 each  3  . Liraglutide (VICTOZA) 18 MG/3ML SOLN Inject 0.6mg  daily for 1 week then 1.2 mg daily for 1 week then 1.8 mg daily as directed  6 mL  6  . lisinopril-hydrochlorothiazide (PRINZIDE,ZESTORETIC) 20-25 MG per tablet Take 1 tablet by mouth daily.  90 tablet  3  . metFORMIN (GLUCOPHAGE) 1000 MG tablet  Take 1 tablet (1,000 mg total) by mouth 2 (two) times daily with a meal.  180 tablet  3  . methadone (DOLOPHINE) 10 MG tablet Take 1 tablet (10 mg total) by mouth as directed. 1-4 by mouth daily  90 tablet  0  . methocarbamol (ROBAXIN) 750 MG tablet Take 1 tablet (750 mg total) by mouth every 6 (six) hours as needed.  90 tablet  4  . mupirocin (BACTROBAN) 2 % ointment Apply topically as directed.        Marland Kitchen omeprazole (PRILOSEC) 20 MG capsule Take 1 capsule (20 mg total) by mouth daily.  90 capsule  4  . pioglitazone (ACTOS) 45 MG tablet Take 1 tablet (45 mg total) by mouth daily.  90 tablet  4  . promethazine (PHENERGAN) 25 MG tablet Take 25 mg by mouth every 6 (six) hours as needed. As needed for nausea       . vardenafil (LEVITRA) 20 MG tablet Take 20 mg by mouth as needed.        BP 150/88  Pulse 80  Temp 98.2 F (36.8 C) (Oral)  Resp 16  Wt 264 lb (119.75 kg)  SpO2 98%       Review of Systems  Constitutional: Positive for unexpected weight  change. Negative for fever, chills, appetite change and fatigue.       Blood pressure 140/70 Weight 264  HENT: Negative for hearing loss, ear pain, congestion, sore throat, trouble swallowing, neck stiffness, dental problem, voice change and tinnitus.   Eyes: Negative for pain, discharge and visual disturbance.  Respiratory: Negative for cough, chest tightness, wheezing and stridor.   Cardiovascular: Negative for chest pain, palpitations and leg swelling.  Gastrointestinal: Negative for nausea, vomiting, abdominal pain, diarrhea, constipation, blood in stool and abdominal distention.  Genitourinary: Negative for urgency, hematuria, flank pain, discharge, difficulty urinating and genital sores.  Musculoskeletal: Negative for myalgias, back pain, joint swelling, arthralgias and gait problem.  Skin: Negative for rash.  Neurological: Negative for dizziness, syncope, speech difficulty, weakness, numbness and headaches.  Hematological: Negative for adenopathy. Does not bruise/bleed easily.  Psychiatric/Behavioral: Negative for behavioral problems and dysphoric mood. The patient is not nervous/anxious.        Objective:   Physical Exam  Constitutional: He is oriented to person, place, and time. He appears well-developed.       Blood pressure 140/70 Weight 264  HENT:  Head: Normocephalic.  Right Ear: External ear normal.  Left Ear: External ear normal.  Eyes: Conjunctivae normal and EOM are normal.  Neck: Normal range of motion.  Cardiovascular: Normal rate and normal heart sounds.   Pulmonary/Chest: Breath sounds normal.  Abdominal: Bowel sounds are normal.  Musculoskeletal: Normal range of motion. He exhibits no edema and no tenderness.  Neurological: He is alert and oriented to person, place, and time.  Psychiatric: He has a normal mood and affect. His behavior is normal.          Assessment & Plan:   Diabetes mellitus. Will check a hemoglobin A1c. Insulin therapy discussed he  wishes to try improve lifestyle issues. We'll continue present regimen Hypertension stable Exogenous obesity. Weight loss encouraged more exercise encouraged Dyslipidemia stable Chronic low back pain. Methadone refilled   All medications refilled

## 2011-11-28 NOTE — Progress Notes (Signed)
  Subjective:    Patient ID: [name], male    DOB: [birth year], 56 y.o.   MRN: [id number]  HPI  Wt Readings from Last 3 Encounters:  11/28/11 264 lb (119.75 kg)  08/25/11 260 lb (117.935 kg)  05/12/11 259 lb (117.482 kg)    Review of Systems     Objective:   Physical Exam        Assessment & Plan:

## 2011-12-08 DIAGNOSIS — M169 Osteoarthritis of hip, unspecified: Secondary | ICD-10-CM | POA: Diagnosis not present

## 2012-01-05 DIAGNOSIS — M19049 Primary osteoarthritis, unspecified hand: Secondary | ICD-10-CM | POA: Diagnosis not present

## 2012-02-28 ENCOUNTER — Ambulatory Visit (INDEPENDENT_AMBULATORY_CARE_PROVIDER_SITE_OTHER): Payer: Medicare Other | Admitting: Internal Medicine

## 2012-02-28 ENCOUNTER — Encounter: Payer: Self-pay | Admitting: Internal Medicine

## 2012-02-28 VITALS — BP 130/80 | HR 84 | Temp 98.4°F | Resp 20 | Wt 264.0 lb

## 2012-02-28 DIAGNOSIS — G8929 Other chronic pain: Secondary | ICD-10-CM

## 2012-02-28 DIAGNOSIS — M1990 Unspecified osteoarthritis, unspecified site: Secondary | ICD-10-CM | POA: Diagnosis not present

## 2012-02-28 DIAGNOSIS — E119 Type 2 diabetes mellitus without complications: Secondary | ICD-10-CM

## 2012-02-28 DIAGNOSIS — E785 Hyperlipidemia, unspecified: Secondary | ICD-10-CM

## 2012-02-28 DIAGNOSIS — I10 Essential (primary) hypertension: Secondary | ICD-10-CM | POA: Diagnosis not present

## 2012-02-28 DIAGNOSIS — E669 Obesity, unspecified: Secondary | ICD-10-CM

## 2012-02-28 DIAGNOSIS — M549 Dorsalgia, unspecified: Secondary | ICD-10-CM

## 2012-02-28 LAB — HEMOGLOBIN A1C: Hgb A1c MFr Bld: 7 % — ABNORMAL HIGH (ref 4.6–6.5)

## 2012-02-28 LAB — GLUCOSE, POCT (MANUAL RESULT ENTRY): POC Glucose: 152 mg/dl — AB (ref 70–99)

## 2012-02-28 MED ORDER — METHADONE HCL 10 MG PO TABS: 10.0000 mg | ORAL_TABLET | ORAL | Status: DC

## 2012-02-28 NOTE — Patient Instructions (Signed)
Limit your sodium (Salt) intake   Please check your hemoglobin A1c every 3 months  You need to lose weight.  Consider a lower calorie diet and regular exercise. 

## 2012-02-28 NOTE — Progress Notes (Signed)
Subjective:    Patient ID: [name], male    DOB: 01-Mar-1955, 57 y.o.   MRN: [id number]  HPI  57 year old patient who is seen today for followup of type 2 diabetes. There has been no weight loss. Remains on triple oral medication as well as Victoza;  he is able tolerate a starting dose only. He states that he is recently initiated better eating habits and has noted improved glycemic control. He has chronic low back pain and is followed by orthopedic surgery. Apparently he anticipates surgery in the near future. He is prescribed Percocet by or so. He has treated hypertension and dyslipidemia. He remains on atorvastatin which he tolerates well  Past Medical History  Diagnosis Date  . History of hip replacement, total   . Diabetes mellitus   . Hypertension   . Hyperlipidemia   . GERD (gastroesophageal reflux disease)   . Obesity   . Chronic pain   . History of colonic polyps   . RBBB (right bundle branch block with left anterior fascicular block)   . 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 Left anterior hemiblock   . Adenomatous polyp     History   Social History  . Marital Status: Married    Spouse Name: N/A    Number of Children: N/A  . Years of Education: N/A   Occupational History  . Not on file.   Social History Main Topics  . Smoking status: Former Smoker    Quit date: 01/04/2007  . Smokeless tobacco: Not on file  . Alcohol Use: Not on file     Comment: recovering alcoholic  . Drug Use: Not on file  . Sexually Active: Not on file   Other Topics Concern  . Not on file   Social History Narrative   Recovering alcoholic      Minister     Past Surgical History  Procedure Laterality Date  . Hip surgery  12/29/09    R THR  (Nitka)  . Appendectomy    . Foot surgery    . Cervical laminectomy    . Cervical fusion    . Hip surgery      bilateral  . Total knee arthroplasty  01/2008    left knee    Family History  Problem Relation Age of  Onset  . Cancer Mother     lung  . Crohn's disease Father   . Alcohol abuse Brother     Allergies  Allergen Reactions  . Aspirin     REACTION: unspecified  . Codeine     REACTION: itch  . Naproxen     REACTION: anaphylaxis    Current Outpatient Prescriptions on File Prior to Visit  Medication Sig Dispense Refill  . atorvastatin (LIPITOR) 40 MG tablet Take 1 tablet (40 mg total) by mouth daily.  90 tablet  3  . diclofenac sodium (VOLTAREN) 1 % GEL Apply topically.        Marland Kitchen glimepiride (AMARYL) 4 MG tablet Take 1 tablet (4 mg total) by mouth daily as needed.  90 tablet  3  . Insulin Pen Needle (NOVOTWIST) 32G X 5 MM MISC 1 each by Does not apply route as directed.  100 each  3  . Liraglutide (VICTOZA) 18 MG/3ML SOLN Inject 0.6mg  daily for 1 week then 1.2 mg daily for 1 week then 1.8 mg daily as directed  6 mL  6  . lisinopril-hydrochlorothiazide (PRINZIDE,ZESTORETIC) 20-25 MG per tablet  Take 1 tablet by mouth daily.  90 tablet  3  . metFORMIN (GLUCOPHAGE) 1000 MG tablet Take 1 tablet (1,000 mg total) by mouth 2 (two) times daily with a meal.  180 tablet  3  . methadone (DOLOPHINE) 10 MG tablet Take 1 tablet (10 mg total) by mouth as directed. 1-4 by mouth daily  90 tablet  0  . methocarbamol (ROBAXIN) 750 MG tablet Take 1 tablet (750 mg total) by mouth every 6 (six) hours as needed.  90 tablet  4  . mupirocin (BACTROBAN) 2 % ointment Apply topically as directed.        Marland Kitchen omeprazole (PRILOSEC) 20 MG capsule Take 1 capsule (20 mg total) by mouth daily.  90 capsule  4  . ondansetron (ZOFRAN-ODT) 4 MG disintegrating tablet Take 1 tablet (4 mg total) by mouth every 8 (eight) hours as needed.  20 tablet  1  . pioglitazone (ACTOS) 45 MG tablet Take 1 tablet (45 mg total) by mouth daily.  90 tablet  4  . promethazine (PHENERGAN) 25 MG tablet Take 25 mg by mouth every 6 (six) hours as needed. As needed for nausea       . vardenafil (LEVITRA) 20 MG tablet Take 20 mg by mouth as needed.        No current facility-administered medications on file prior to visit.    BP 150/80  Pulse 84  Temp(Src) 98.4 F (36.9 C) (Oral)  Resp 20  Wt 264 lb (119.75 kg)  BMI 35.8 kg/m2  SpO2 95%       Review of Systems  Constitutional: Negative for fever, chills, appetite change and fatigue.  HENT: Negative for hearing loss, ear pain, congestion, sore throat, trouble swallowing, neck stiffness, dental problem, voice change and tinnitus.   Eyes: Negative for pain, discharge and visual disturbance.  Respiratory: Negative for cough, chest tightness, wheezing and stridor.   Cardiovascular: Negative for chest pain, palpitations and leg swelling.  Gastrointestinal: Negative for nausea, vomiting, abdominal pain, diarrhea, constipation, blood in stool and abdominal distention.  Genitourinary: Negative for urgency, hematuria, flank pain, discharge, difficulty urinating and genital sores.  Musculoskeletal: Positive for back pain, arthralgias and gait problem. Negative for myalgias and joint swelling.  Skin: Negative for rash.  Neurological: Negative for dizziness, syncope, speech difficulty, weakness, numbness and headaches.  Hematological: Negative for adenopathy. Does not bruise/bleed easily.  Psychiatric/Behavioral: Negative for behavioral problems and dysphoric mood. The patient is not nervous/anxious.        Objective:   Physical Exam  Constitutional: He is oriented to person, place, and time. He appears well-developed.  Weight 264 Repeat blood pressure 110/72  HENT:  Head: Normocephalic.  Right Ear: External ear normal.  Left Ear: External ear normal.  Eyes: Conjunctivae and EOM are normal.  Neck: Normal range of motion.  Cardiovascular: Normal rate and normal heart sounds.   Pulmonary/Chest: Breath sounds normal.  Abdominal: Bowel sounds are normal.  Musculoskeletal: Normal range of motion. He exhibits no edema and no tenderness.  Neurological: He is alert and oriented to  person, place, and time.  Psychiatric: He has a normal mood and affect. His behavior is normal.          Assessment & Plan:   Diabetes mellitus. Will check a hemoglobin A1c. Lifestyle issues discussed. Recheck 3 months  Hypertension well controlled Dyslipidemia continue atorvastatin Exogenous obesity Chronic low back pain. Methadone refilled  Healthcare questionnaire completed  Recheck 3 months

## 2012-02-28 NOTE — Progress Notes (Signed)
  Subjective:    Patient ID: [name], male    DOB: 02-16-1955, 57 y.o.   MRN: [id number]  HPI  Wt Readings from Last 3 Encounters:  02/28/12 264 lb (119.75 kg)  11/28/11 264 lb (119.75 kg)  08/25/11 260 lb (117.935 kg)      Review of Systems     Objective:   Physical Exam        Assessment & Plan:

## 2012-03-14 DIAGNOSIS — M25559 Pain in unspecified hip: Secondary | ICD-10-CM | POA: Diagnosis not present

## 2012-03-20 DIAGNOSIS — M169 Osteoarthritis of hip, unspecified: Secondary | ICD-10-CM | POA: Diagnosis not present

## 2012-03-27 DIAGNOSIS — M25559 Pain in unspecified hip: Secondary | ICD-10-CM | POA: Diagnosis not present

## 2012-03-27 DIAGNOSIS — M169 Osteoarthritis of hip, unspecified: Secondary | ICD-10-CM | POA: Diagnosis not present

## 2012-03-29 ENCOUNTER — Telehealth: Payer: Self-pay | Admitting: Internal Medicine

## 2012-03-29 MED ORDER — PIOGLITAZONE HCL 45 MG PO TABS: 45.0000 mg | ORAL_TABLET | Freq: Every day | ORAL | Status: DC

## 2012-03-29 NOTE — Telephone Encounter (Signed)
Spoke to pt told him Rx requests were sent to pharmacy. Pt verbalized understanding.

## 2012-03-29 NOTE — Telephone Encounter (Signed)
Pt needs refill of pioglitazone (ACTOS) 45 MG tablet Please send to [facility]   BUT... pt is out and needs a 30 day script sent to  [facility] [place], [place]

## 2012-04-11 ENCOUNTER — Encounter: Payer: Self-pay | Admitting: Gastroenterology

## 2012-04-12 DIAGNOSIS — M1710 Unilateral primary osteoarthritis, unspecified knee: Secondary | ICD-10-CM | POA: Diagnosis not present

## 2012-05-16 DIAGNOSIS — M67919 Unspecified disorder of synovium and tendon, unspecified shoulder: Secondary | ICD-10-CM | POA: Diagnosis not present

## 2012-05-16 DIAGNOSIS — M25559 Pain in unspecified hip: Secondary | ICD-10-CM | POA: Diagnosis not present

## 2012-05-16 DIAGNOSIS — M719 Bursopathy, unspecified: Secondary | ICD-10-CM | POA: Diagnosis not present

## 2012-05-30 ENCOUNTER — Ambulatory Visit (INDEPENDENT_AMBULATORY_CARE_PROVIDER_SITE_OTHER): Payer: Medicare Other | Admitting: Internal Medicine

## 2012-05-30 ENCOUNTER — Encounter: Payer: Self-pay | Admitting: Internal Medicine

## 2012-05-30 VITALS — BP 140/86 | HR 81 | Temp 98.2°F | Resp 20 | Wt 268.0 lb

## 2012-05-30 DIAGNOSIS — E119 Type 2 diabetes mellitus without complications: Secondary | ICD-10-CM | POA: Diagnosis not present

## 2012-05-30 DIAGNOSIS — E785 Hyperlipidemia, unspecified: Secondary | ICD-10-CM

## 2012-05-30 DIAGNOSIS — I10 Essential (primary) hypertension: Secondary | ICD-10-CM | POA: Diagnosis not present

## 2012-05-30 DIAGNOSIS — G8929 Other chronic pain: Secondary | ICD-10-CM

## 2012-05-30 DIAGNOSIS — E669 Obesity, unspecified: Secondary | ICD-10-CM

## 2012-05-30 DIAGNOSIS — M549 Dorsalgia, unspecified: Secondary | ICD-10-CM

## 2012-05-30 LAB — HEMOGLOBIN A1C: Hgb A1c MFr Bld: 6.9 % — ABNORMAL HIGH (ref 4.6–6.5)

## 2012-05-30 MED ORDER — METHADONE HCL 10 MG PO TABS: 10.0000 mg | ORAL_TABLET | ORAL | Status: DC

## 2012-05-30 NOTE — Progress Notes (Signed)
Subjective:    Patient ID: [name], male    DOB: [birth year], 57 y.o.   MRN: [id number]  HPI  56 year old patient who is seen today for his quarterly followup. He has a history of type 2 diabetes. He feels that his glycemic control has steadily improved. His last hemoglobin A1c was 7.0 which was greatly improved compared to prior readings. He states that when he is active blood sugars occasionally get down into the 60s. This is infrequent He has significant arthritis and anticipates left total hip replacement later this summer. He is requesting a EKG preoperatively. He denies any exertional chest pain but states that he notes his heart fluttering occasionally with activities. He is fairly inactive. He has treated hypertension.  Wt Readings from Last 3 Encounters:  05/30/12 268 lb (121.564 kg)  02/28/12 264 lb (119.75 kg)  11/28/11 264 lb (119.75 kg)    Past Medical History  Diagnosis Date  . History of hip replacement, total   . Diabetes mellitus   . Hypertension   . Hyperlipidemia   . GERD (gastroesophageal reflux disease)   . Obesity   . Chronic pain   . History of colonic polyps   . RBBB (right bundle branch block with left anterior fascicular block)   . 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 Left anterior hemiblock   . Adenomatous polyp     History   Social History  . Marital Status: Married    Spouse Name: N/A    Number of Children: N/A  . Years of Education: N/A   Occupational History  . Not on file.   Social History Main Topics  . Smoking status: Former Smoker    Quit date: 01/04/2007  . Smokeless tobacco: Not on file  . Alcohol Use: Not on file     Comment: recovering alcoholic  . Drug Use: Not on file  . Sexually Active: Not on file   Other Topics Concern  . Not on file   Social History Narrative   Recovering alcoholic      Minister     Past Surgical History  Procedure Laterality Date  . Hip surgery  12/29/09    R THR   (Nitka)  . Appendectomy    . Foot surgery    . Cervical laminectomy    . Cervical fusion    . Hip surgery      bilateral  . Total knee arthroplasty  01/2008    left knee    Family History  Problem Relation Age of Onset  . Cancer Mother     lung  . Crohn's disease Father   . Alcohol abuse Brother     Allergies  Allergen Reactions  . Aspirin     REACTION: unspecified  . Codeine     REACTION: itch  . Naproxen     REACTION: anaphylaxis    Current Outpatient Prescriptions on File Prior to Visit  Medication Sig Dispense Refill  . amoxicillin (AMOXIL) 500 MG capsule Take 2,000 mg by mouth as needed.       Marland Kitchen atorvastatin (LIPITOR) 40 MG tablet Take 1 tablet (40 mg total) by mouth daily.  90 tablet  3  . diclofenac sodium (VOLTAREN) 1 % GEL Apply topically.        Marland Kitchen glimepiride (AMARYL) 4 MG tablet Take 1 tablet (4 mg total) by mouth daily as needed.  90 tablet  3  . Insulin Pen Needle (NOVOTWIST) 32G X  5 MM MISC 1 each by Does not apply route as directed.  100 each  3  . lidocaine (LIDODERM) 5 % Place 1 patch onto the skin as needed.       . Liraglutide (VICTOZA) 18 MG/3ML SOLN Inject 0.6mg  daily for 1 week then 1.2 mg daily for 1 week then 1.8 mg daily as directed  6 mL  6  . lisinopril-hydrochlorothiazide (PRINZIDE,ZESTORETIC) 20-25 MG per tablet Take 1 tablet by mouth daily.  90 tablet  3  . metFORMIN (GLUCOPHAGE) 1000 MG tablet Take 1 tablet (1,000 mg total) by mouth 2 (two) times daily with a meal.  180 tablet  3  . methocarbamol (ROBAXIN) 750 MG tablet Take 1 tablet (750 mg total) by mouth every 6 (six) hours as needed.  90 tablet  4  . omeprazole (PRILOSEC) 20 MG capsule Take 1 capsule (20 mg total) by mouth daily.  90 capsule  4  . ondansetron (ZOFRAN-ODT) 4 MG disintegrating tablet Take 1 tablet (4 mg total) by mouth every 8 (eight) hours as needed.  20 tablet  1  . oxyCODONE-acetaminophen (PERCOCET/ROXICET) 5-325 MG per tablet Take 1 tablet by mouth as needed.       .  pioglitazone (ACTOS) 45 MG tablet Take 1 tablet (45 mg total) by mouth daily.  30 tablet  0  . promethazine (PHENERGAN) 25 MG tablet Take 25 mg by mouth every 6 (six) hours as needed. As needed for nausea       . vardenafil (LEVITRA) 20 MG tablet Take 20 mg by mouth as needed.       No current facility-administered medications on file prior to visit.    BP 140/86  Pulse 81  Temp(Src) 98.2 F (36.8 C) (Oral)  Resp 20  Wt 268 lb (121.564 kg)  BMI 36.34 kg/m2  SpO2 99%     Review of Systems  Constitutional: Negative for fever, chills, appetite change and fatigue.  HENT: Negative for hearing loss, ear pain, congestion, sore throat, trouble swallowing, neck stiffness, dental problem, voice change and tinnitus.   Eyes: Negative for pain, discharge and visual disturbance.  Respiratory: Negative for cough, chest tightness, wheezing and stridor.   Cardiovascular: Positive for palpitations. Negative for chest pain and leg swelling.  Gastrointestinal: Negative for nausea, vomiting, abdominal pain, diarrhea, constipation, blood in stool and abdominal distention.  Genitourinary: Negative for urgency, hematuria, flank pain, discharge, difficulty urinating and genital sores.  Musculoskeletal: Positive for back pain, arthralgias and gait problem. Negative for myalgias and joint swelling.  Skin: Negative for rash.  Neurological: Negative for dizziness, syncope, speech difficulty, weakness, numbness and headaches.  Hematological: Negative for adenopathy. Does not bruise/bleed easily.  Psychiatric/Behavioral: Negative for behavioral problems and dysphoric mood. The patient is not nervous/anxious.        Objective:   Physical Exam  Constitutional: He is oriented to person, place, and time. He appears well-developed.  Obese Blood pressure 150/74  HENT:  Head: Normocephalic.  Right Ear: External ear normal.  Left Ear: External ear normal.  Eyes: Conjunctivae and EOM are normal.  Neck: Normal  range of motion.  Cardiovascular: Normal rate and normal heart sounds.   Occasional ectopics  Pulmonary/Chest: Breath sounds normal.  Abdominal: Bowel sounds are normal.  Musculoskeletal: Normal range of motion. He exhibits no edema and no tenderness.  Neurological: He is alert and oriented to person, place, and time.  Psychiatric: He has a normal mood and affect. His behavior is normal.  Assessment & Plan:   Diabetes mellitus. Will check a hemoglobin A1c. Glycemic control seems improved Hypertension fairly stable we'll continue present regimen Osteoarthritis  EKG reviewed- this reveals a normal sinus rhythm with frequent PVCs; he has a nonspecific IVCD (h/o RBBB) with first degree AV block and LAHB;  we'll obtain a preoperative cardiology consultation and consider stress testing for risk stratification  Medical regimen including methadone refilled Recheck 3 months

## 2012-05-30 NOTE — Patient Instructions (Addendum)
Limit your sodium (Salt) intake  You need to lose weight.  Consider a lower calorie diet and regular exercise.   Please check your hemoglobin A1c every 3 months  Cardiology evaluation as discussed

## 2012-06-12 ENCOUNTER — Encounter: Payer: Self-pay | Admitting: Cardiology

## 2012-06-12 ENCOUNTER — Ambulatory Visit (INDEPENDENT_AMBULATORY_CARE_PROVIDER_SITE_OTHER): Payer: 59 | Admitting: Cardiology

## 2012-06-12 VITALS — BP 140/70 | HR 81 | Ht 72.0 in | Wt 269.0 lb

## 2012-06-12 DIAGNOSIS — E785 Hyperlipidemia, unspecified: Secondary | ICD-10-CM | POA: Diagnosis not present

## 2012-06-12 DIAGNOSIS — R0609 Other forms of dyspnea: Secondary | ICD-10-CM

## 2012-06-12 DIAGNOSIS — E7800 Pure hypercholesterolemia, unspecified: Secondary | ICD-10-CM

## 2012-06-12 DIAGNOSIS — R0989 Other specified symptoms and signs involving the circulatory and respiratory systems: Secondary | ICD-10-CM

## 2012-06-12 DIAGNOSIS — R0600 Dyspnea, unspecified: Secondary | ICD-10-CM | POA: Insufficient documentation

## 2012-06-12 LAB — LIPID PANEL
Cholesterol: 169 mg/dL (ref 0–200)
HDL: 28.8 mg/dL — ABNORMAL LOW (ref >39.00)
Total CHOL/HDL Ratio: 6
Triglycerides: 260 mg/dL — ABNORMAL HIGH (ref 0.0–149.0)
VLDL: 52 mg/dL — ABNORMAL HIGH (ref 0.0–40.0)

## 2012-06-12 LAB — LDL CHOLESTEROL, DIRECT: Direct LDL: 100.5 mg/dL

## 2012-06-12 NOTE — Progress Notes (Signed)
Patient ID: [name], male   DOB: 1955-11-11, 57 y.o.   MRN: [id number] PCP: Dr. Amador Cunas  57 yo with history of HTN, diabetes, smoking, and extensive arthritis presents for cardiology evaluation. He needs to undergo a redo left hip replacement.  He does not get chest pain.  He does get short of breath when he walks fast or climbs steps.  He fatigues easily and feels like his energy level is poor.  He is not very active overall because of his knee and back pain.  He still smokes 1 ppd but wants to try to stop before his hip surgery.  Of notes, triglycerides were very high when checked in 8/13.  Finally, he has been told not to take aspirin because of a prior anaphylactic reaction to Toradol.  ECG: NSR, PVCs, IVCD, left axis deviation, poor anterior R wave progression  Labs (8/13): LDL 94, HDL 30, TGs 1107, K 4.1, creatinine 0.7  PMH: 1. Hyperlipidemia 2. Type II diabetes 3. HTN 4. GERD 5. Obesity 6. Osteoarthritis: Severe.  Has had bilateral hip replacements and has had redo on the right.  He now needs a redo on the left.  He has had left TKR.  7. Colon polyps 8. Back surgeries 9. Chronic pain back and hips.   10. Active smoker 11. Anaphylactic reaction to toradol.  Has been told not to take any NSAIDs or aspirin.   SH: Lives in [place], married, [employer], former ETOH abuse, smokes 1 ppd.   FH: Father with PAD.   ROS: All systems reviewed and negative except as per HPI.   Current Outpatient Prescriptions  Medication Sig Dispense Refill  . atorvastatin (LIPITOR) 40 MG tablet Take 1 tablet (40 mg total) by mouth daily.  90 tablet  3  . diclofenac sodium (VOLTAREN) 1 % GEL Apply topically.        Marland Kitchen glimepiride (AMARYL) 4 MG tablet Take 1 tablet (4 mg total) by mouth daily as needed.  90 tablet  3  . Insulin Pen Needle (NOVOTWIST) 32G X 5 MM MISC 1 each by Does not apply route as directed.  100 each  3  . lidocaine (LIDODERM) 5 % Place 1 patch onto the skin as  needed.       . Liraglutide (VICTOZA) 18 MG/3ML SOLN Inject 0.6mg  daily for 1 week then 1.2 mg daily for 1 week then 1.8 mg daily as directed  6 mL  6  . lisinopril-hydrochlorothiazide (PRINZIDE,ZESTORETIC) 20-25 MG per tablet Take 1 tablet by mouth daily.  90 tablet  3  . metFORMIN (GLUCOPHAGE) 1000 MG tablet Take 1 tablet (1,000 mg total) by mouth 2 (two) times daily with a meal.  180 tablet  3  . methadone (DOLOPHINE) 10 MG tablet Take 1 tablet (10 mg total) by mouth as directed. 1-4 by mouth daily  90 tablet  0  . methocarbamol (ROBAXIN) 750 MG tablet Take 1 tablet (750 mg total) by mouth every 6 (six) hours as needed.  90 tablet  4  . omeprazole (PRILOSEC) 20 MG capsule Take 1 capsule (20 mg total) by mouth daily.  90 capsule  4  . ondansetron (ZOFRAN-ODT) 4 MG disintegrating tablet Take 1 tablet (4 mg total) by mouth every 8 (eight) hours as needed.  20 tablet  1  . oxyCODONE-acetaminophen (PERCOCET/ROXICET) 5-325 MG per tablet Take 1 tablet by mouth as needed.       . penicillin v potassium (VEETID) 500 MG tablet as directed.      Marland Kitchen  pioglitazone (ACTOS) 45 MG tablet Take 1 tablet (45 mg total) by mouth daily.  30 tablet  0  . promethazine (PHENERGAN) 25 MG tablet Take 25 mg by mouth every 6 (six) hours as needed. As needed for nausea       . vardenafil (LEVITRA) 20 MG tablet Take 20 mg by mouth as needed.       No current facility-administered medications for this visit.    BP 140/70  Pulse 81  Ht 6' (1.829 m)  Wt 269 lb (122.018 kg)  BMI 36.48 kg/m2  SpO2 97% General: NAD Neck: No JVD, no thyromegaly or thyroid nodule.  Lungs: Clear to auscultation bilaterally with normal respiratory effort. CV: Nondisplaced PMI.  Heart regular S1/S2, no S3/S4, no murmur.  No peripheral edema.  No carotid bruit.  Normal pedal pulses.  Abdomen: Soft, nontender, no hepatosplenomegaly, no distention.  Skin: Surgical scars legs/knees Neurologic: Alert and oriented x 3.  Psych: Normal  affect. Extremities: No clubbing or cyanosis.  Varicose veins lower legs. HEENT: Normal.   Assessment/Plan: 1. Exertional dyspnea: Patient reports chronic exertional dyspnea and fatigue.  No chest pain.  He has an abnormal ECG. Cardiac risk factors include diabetes, HTN, hyperlipidemia, and smoking.  At baseline, he has fairly high risk for future cardiac events and he is going to be undergoing orthopedic surgery.  Therefore, I think that it would be reasonable to arrange for [facility] for risk stratification (does not think he could walk much on the treadmill).   2. Hyperlipidemia: I will check fasting lipids.  His triglycerides were very high in 8/13 and he may need treatment.  He should continue on a statin given his coronary risk factors.  3. ? ASA allergy: Patient apparently had an anaphylactic reaction to toradol and was told not to take aspirin or NSAIDs.  Given his risk factor profile, [name] should be on ASA 37 and may need aspirin for diagnosed CAD or CVA in the future.  I am going to refer him to an allergist to try to sort this out and to see if he could take ASA 81.  4. Preoperative evaluation: If stress test is low risk, patient can undergo surgery without further workup.  He needs to quit smoking prior.  5. Smoking: Patient wants to try to quit on his own.  He has done this before prior to past surgeries.  I strongly encouraged him to quit.   Marca Ancona 06/12/2012 2:26 PM

## 2012-06-12 NOTE — Patient Instructions (Addendum)
Your physician recommends that you have  a FASTING lipid profile today.  Your physician has requested that you have a lexiscan myoview. For further information please visit [email or web address] Please follow instruction sheet, as given.  You have been referred to [facility] to confirm aspirin allergy.   You have  a follow-up appointment with Dr Shirlee Latch Thursday July 3,2014 at [facility].

## 2012-06-18 ENCOUNTER — Ambulatory Visit (HOSPITAL_COMMUNITY): Payer: 59 | Attending: Cardiology | Admitting: Radiology

## 2012-06-18 ENCOUNTER — Encounter: Payer: Self-pay | Admitting: Cardiology

## 2012-06-18 VITALS — BP 122/67 | HR 85 | Ht 72.0 in | Wt 265.0 lb

## 2012-06-18 DIAGNOSIS — E785 Hyperlipidemia, unspecified: Secondary | ICD-10-CM | POA: Diagnosis not present

## 2012-06-18 DIAGNOSIS — E669 Obesity, unspecified: Secondary | ICD-10-CM | POA: Diagnosis not present

## 2012-06-18 DIAGNOSIS — I4510 Unspecified right bundle-branch block: Secondary | ICD-10-CM | POA: Diagnosis not present

## 2012-06-18 DIAGNOSIS — I10 Essential (primary) hypertension: Secondary | ICD-10-CM | POA: Diagnosis not present

## 2012-06-18 DIAGNOSIS — R0989 Other specified symptoms and signs involving the circulatory and respiratory systems: Secondary | ICD-10-CM | POA: Diagnosis not present

## 2012-06-18 DIAGNOSIS — E119 Type 2 diabetes mellitus without complications: Secondary | ICD-10-CM | POA: Diagnosis not present

## 2012-06-18 DIAGNOSIS — Z01810 Encounter for preprocedural cardiovascular examination: Secondary | ICD-10-CM | POA: Insufficient documentation

## 2012-06-18 DIAGNOSIS — R0602 Shortness of breath: Secondary | ICD-10-CM

## 2012-06-18 DIAGNOSIS — R0609 Other forms of dyspnea: Secondary | ICD-10-CM | POA: Insufficient documentation

## 2012-06-18 DIAGNOSIS — F17200 Nicotine dependence, unspecified, uncomplicated: Secondary | ICD-10-CM | POA: Insufficient documentation

## 2012-06-18 DIAGNOSIS — R000 Tachycardia, unspecified: Secondary | ICD-10-CM | POA: Insufficient documentation

## 2012-06-18 DIAGNOSIS — Z794 Long term (current) use of insulin: Secondary | ICD-10-CM | POA: Diagnosis not present

## 2012-06-18 DIAGNOSIS — R5381 Other malaise: Secondary | ICD-10-CM | POA: Insufficient documentation

## 2012-06-18 DIAGNOSIS — E7800 Pure hypercholesterolemia, unspecified: Secondary | ICD-10-CM

## 2012-06-18 MED ORDER — TECHNETIUM TC 99M SESTAMIBI GENERIC - CARDIOLITE
10.0000 | Freq: Once | INTRAVENOUS | Status: AC | PRN
  Administered 2012-06-18: 10 via INTRAVENOUS

## 2012-06-18 MED ORDER — TECHNETIUM TC 99M SESTAMIBI GENERIC - CARDIOLITE
33.0000 | Freq: Once | INTRAVENOUS | Status: AC | PRN
  Administered 2012-06-18: 33 via INTRAVENOUS

## 2012-06-18 MED ORDER — TECHNETIUM TC 99M SESTAMIBI GENERIC - CARDIOLITE
11.0000 | Freq: Once | INTRAVENOUS | Status: AC | PRN
  Administered 2012-06-18: 11 via INTRAVENOUS

## 2012-06-18 MED ORDER — REGADENOSON 0.4 MG/5ML IV SOLN
0.4000 mg | Freq: Once | INTRAVENOUS | Status: AC
  Administered 2012-06-18: 0.4 mg via INTRAVENOUS

## 2012-06-18 MED ORDER — TECHNETIUM TC 99M SESTAMIBI GENERIC - CARDIOLITE
30.0000 | Freq: Once | INTRAVENOUS | Status: AC | PRN
  Administered 2012-06-18: 30 via INTRAVENOUS

## 2012-06-18 NOTE — Progress Notes (Addendum)
[facility] SITE 3 NUCLEAR MED [address] [place], [place] [address] [phone]    Cardiology Nuclear Med Study  [name] is a 57 y.o. male     MRN : [id number]     DOB: August 15, 1955  Procedure Date: 06/18/2012  Nuclear Med Background Indication for Stress Test:  Evaluation for Ischemia and Pending Surgical Clearance for (L) THR redo History:  '05 [id number] (see report) Cardiac Risk Factors: Hypertension, IDDM Type 2, Lipids, Obesity and Smoker  Symptoms:  DOE, Fatigue with Exertion and Rapid HR   Nuclear Pre-Procedure Caffeine/Decaff Intake:  None NPO After: 4:30am   Lungs:  Clear. O2 Sat: 94% on room air. IV 0.9% NS with Angio Cath:  20g  IV Site: R Forearm  IV Started by:  Stanton Kidney, EMT-P  Chest Size (in):  50 Cup Size: n/a  Height: 6' (1.829 m)  Weight:  265 lb (120.203 kg)  BMI:  Body mass index is 35.93 kg/(m^2). Tech Comments:  CBG= 141 @ 8:30 am, per patient    Nuclear Med Study 1 or 2 day study: 1 day  Stress Test Type:  Eugenie Birks  Reading MD: Marca Ancona, MD  Order Authorizing Provider:  Marca Ancona, MD  Resting Radionuclide: Technetium 46m Sestamibi  Resting Radionuclide Dose: 11.0 mCi   Stress Radionuclide:  Technetium 50m Sestamibi  Stress Radionuclide Dose: 33.0 mCi           Stress Protocol Rest HR: 85 Stress HR: 92  Rest BP: 122/67 Stress BP: 131/64  Exercise Time (min): n/a METS: n/a   Predicted Max HR: 162 bpm % Max HR: 56.79 bpm Rate Pressure Product: 25366   Dose of Adenosine (mg):  n/a Dose of Lexiscan: 0.4 mg  Dose of Atropine (mg): n/a Dose of Dobutamine: n/a mcg/kg/min (at max HR)  Stress Test Technologist: Smiley Houseman, CMA-N  Nuclear Technologist:  Doyne Keel, CNMT     Rest Procedure:  Myocardial perfusion imaging was performed at rest 45 minutes following the intravenous administration of Technetium 53m Sestamibi.  Rest ECG: NSR-RBBB  Stress Procedure:  The patient received IV Lexiscan 0.4 mg over  15-seconds.  Technetium 60m Sestamibi injected at 30-seconds.  Quantitative spect images were obtained after a 45 minute delay.  Stress ECG: No significant change from baseline ECG  QPS Raw Data Images:  Normal; no motion artifact; normal heart/lung ratio. Stress Images:  Normal homogeneous uptake in all areas of the myocardium. Rest Images:  Normal homogeneous uptake in all areas of the myocardium. Subtraction (SDS):  There is no evidence of scar or ischemia. Transient Ischemic Dilatation (Normal <1.22):  1.07 Lung/Heart Ratio (Normal <0.45):  0.38  Quantitative Gated Spect Images QGS EDV:  129 ml QGS ESV:  43 ml  Impression Exercise Capacity:  Lexiscan with no exercise. BP Response:  Normal blood pressure response. Clinical Symptoms:  Nausea ECG Impression:  NSR with RBBB, no significant change with infusion.  Comparison with Prior Nuclear Study: No images to compare  Overall Impression:  Normal stress nuclear study.  LV Ejection Fraction: 67%.  LV Wall Motion:  NL LV Function; NL Wall Motion  Marca Ancona 06/18/2012  Normal stress test.  Marca Ancona 06/19/2012

## 2012-06-19 NOTE — Progress Notes (Signed)
Pt.notified

## 2012-06-21 ENCOUNTER — Encounter: Payer: Self-pay | Admitting: Gastroenterology

## 2012-06-21 ENCOUNTER — Encounter: Payer: Self-pay | Admitting: Internal Medicine

## 2012-06-21 DIAGNOSIS — M67919 Unspecified disorder of synovium and tendon, unspecified shoulder: Secondary | ICD-10-CM | POA: Diagnosis not present

## 2012-06-21 DIAGNOSIS — M722 Plantar fascial fibromatosis: Secondary | ICD-10-CM | POA: Diagnosis not present

## 2012-06-21 DIAGNOSIS — M719 Bursopathy, unspecified: Secondary | ICD-10-CM | POA: Diagnosis not present

## 2012-06-28 ENCOUNTER — Telehealth: Payer: Self-pay | Admitting: Internal Medicine

## 2012-06-28 MED ORDER — ATORVASTATIN CALCIUM 40 MG PO TABS: 40.0000 mg | ORAL_TABLET | Freq: Every day | ORAL | Status: DC

## 2012-06-28 MED ORDER — GLIMEPIRIDE 4 MG PO TABS: 4.0000 mg | ORAL_TABLET | Freq: Every day | ORAL | Status: DC | PRN

## 2012-06-28 MED ORDER — METFORMIN HCL 1000 MG PO TABS: 1000.0000 mg | ORAL_TABLET | Freq: Two times a day (BID) | ORAL | Status: DC

## 2012-06-28 MED ORDER — LISINOPRIL-HYDROCHLOROTHIAZIDE 20-25 MG PO TABS: 1.0000 | ORAL_TABLET | Freq: Every day | ORAL | Status: DC

## 2012-06-28 MED ORDER — PIOGLITAZONE HCL 45 MG PO TABS: 45.0000 mg | ORAL_TABLET | Freq: Every day | ORAL | Status: DC

## 2012-06-28 NOTE — Telephone Encounter (Signed)
Pt needs refill of  metFORMIN (GLUCOPHAGE) 1000 MG tablet (1po BID) glimepiride (AMARYL) 4 MG tablet  1 /day atorvastatin (LIPITOR) 40 MG tablet  1/day lisinopril-hydrochlorothiazide (PRINZIDE,ZESTORETIC) 20-25 MG  1/day pioglitazone (ACTOS) 45 MG tablet  1/day  ALL 90 day supply!! Pt would like to change his pharm to [facility]

## 2012-06-28 NOTE — Telephone Encounter (Signed)
Pt notified Rx refills sent to new [facility].

## 2012-07-05 ENCOUNTER — Ambulatory Visit: Payer: 59 | Admitting: Cardiology

## 2012-07-17 DIAGNOSIS — M169 Osteoarthritis of hip, unspecified: Secondary | ICD-10-CM | POA: Diagnosis not present

## 2012-07-17 DIAGNOSIS — M25559 Pain in unspecified hip: Secondary | ICD-10-CM | POA: Diagnosis not present

## 2012-07-27 ENCOUNTER — Ambulatory Visit (INDEPENDENT_AMBULATORY_CARE_PROVIDER_SITE_OTHER): Payer: 59 | Admitting: Cardiology

## 2012-07-27 ENCOUNTER — Encounter: Payer: Self-pay | Admitting: Cardiology

## 2012-07-27 VITALS — BP 124/66 | HR 85 | Ht 72.0 in | Wt 268.0 lb

## 2012-07-27 DIAGNOSIS — E785 Hyperlipidemia, unspecified: Secondary | ICD-10-CM

## 2012-07-27 DIAGNOSIS — F17200 Nicotine dependence, unspecified, uncomplicated: Secondary | ICD-10-CM

## 2012-07-27 DIAGNOSIS — R0600 Dyspnea, unspecified: Secondary | ICD-10-CM

## 2012-07-27 NOTE — Patient Instructions (Signed)
Your physician recommends that you schedule a follow-up appointment as needed with Dr McLean.  

## 2012-07-28 DIAGNOSIS — F17200 Nicotine dependence, unspecified, uncomplicated: Secondary | ICD-10-CM | POA: Insufficient documentation

## 2012-07-28 DIAGNOSIS — IMO0001 Reserved for inherently not codable concepts without codable children: Secondary | ICD-10-CM | POA: Insufficient documentation

## 2012-07-28 NOTE — Progress Notes (Signed)
Patient ID: [name], male   DOB: Nov 04, 1955, 57 y.o.   MRN: [id number] PCP: Dr. Amador Cunas  57 yo with history of HTN, diabetes, smoking, and extensive arthritis returns for cardiology evaluation. He needs to undergo a redo left hip replacement.  He does not get chest pain.  He does get short of breath when he walks fast or climbs steps.  He fatigues easily and feels like his energy level is poor.  He is not very active overall because of his knee and back pain.  He is trying to cut back on smoking.  He saw an allergist about his aspirin allergy and is now taking ASA 81 mg daily.   Lexiscan myoview in 6/14 showed EF 67% with no evidence for ischemia or infarction.   Labs (8/13): LDL 94, HDL 30, TGs 1107, K 4.1, creatinine 0.7 Labs (6/14): LDL 100, HDL 29, TGs 260  PMH: 1. Hyperlipidemia 2. Type II diabetes 3. HTN 4. GERD 5. Obesity 6. Osteoarthritis: Severe.  Has had bilateral hip replacements and has had redo on the right.  He now needs a redo on the left.  He has had left TKR.  7. Colon polyps 8. Back surgeries 9. Chronic pain back and hips.   10. Active smoker 11. Anaphylactic reaction to toradol.  Saw an allergist, ok to take ASA 81 mg daily.  12. Lexiscan myoview (6/14) with EF 67%, no evidence for ischemia or infarction.   SH: Lives in [place], married, [employer], former ETOH abuse, smokes 1 ppd.   FH: Father with PAD.   Current Outpatient Prescriptions  Medication Sig Dispense Refill  . aspirin 81 MG tablet Take 81 mg by mouth daily.      Marland Kitchen atorvastatin (LIPITOR) 40 MG tablet Take 1 tablet (40 mg total) by mouth daily.  90 tablet  3  . diclofenac sodium (VOLTAREN) 1 % GEL Apply topically.        Marland Kitchen glimepiride (AMARYL) 4 MG tablet Take 1 tablet (4 mg total) by mouth daily as needed.  90 tablet  3  . Insulin Pen Needle (NOVOTWIST) 32G X 5 MM MISC 1 each by Does not apply route as directed.  100 each  3  . lidocaine (LIDODERM) 5 % Place 1 patch onto the skin  as needed.       . Liraglutide (VICTOZA) 18 MG/3ML SOLN Inject 0.6mg  daily for 1 week then 1.2 mg daily for 1 week then 1.8 mg daily as directed  6 mL  6  . lisinopril-hydrochlorothiazide (PRINZIDE,ZESTORETIC) 20-25 MG per tablet Take 1 tablet by mouth daily.  90 tablet  3  . metFORMIN (GLUCOPHAGE) 1000 MG tablet Take 1 tablet (1,000 mg total) by mouth 2 (two) times daily with a meal.  180 tablet  3  . methadone (DOLOPHINE) 10 MG tablet Take 1 tablet (10 mg total) by mouth as directed. 1-4 by mouth daily  90 tablet  0  . methocarbamol (ROBAXIN) 750 MG tablet Take 1 tablet (750 mg total) by mouth every 6 (six) hours as needed.  90 tablet  4  . omeprazole (PRILOSEC) 20 MG capsule Take 1 capsule (20 mg total) by mouth daily.  90 capsule  4  . ondansetron (ZOFRAN-ODT) 4 MG disintegrating tablet Take 1 tablet (4 mg total) by mouth every 8 (eight) hours as needed.  20 tablet  1  . oxyCODONE-acetaminophen (PERCOCET/ROXICET) 5-325 MG per tablet Take 1 tablet by mouth as needed.       . penicillin  v potassium (VEETID) 500 MG tablet as directed.      . pioglitazone (ACTOS) 45 MG tablet Take 1 tablet (45 mg total) by mouth daily.  90 tablet  3  . promethazine (PHENERGAN) 25 MG tablet Take 25 mg by mouth every 6 (six) hours as needed. As needed for nausea       . vardenafil (LEVITRA) 20 MG tablet Take 20 mg by mouth as needed.       No current facility-administered medications for this visit.    BP 124/66  Pulse 85  Ht 6' (1.829 m)  Wt 121.564 kg (268 lb)  BMI 36.34 kg/m2  SpO2 98% General: NAD Neck: No JVD, no thyromegaly or thyroid nodule.  Lungs: Clear to auscultation bilaterally with normal respiratory effort. CV: Nondisplaced PMI.  Heart regular S1/S2, no S3/S4, no murmur.  No peripheral edema.  No carotid bruit.  Normal pedal pulses.  Abdomen: Soft, nontender, no hepatosplenomegaly, no distention.  Skin: Surgical scars legs/knees Neurologic: Alert and oriented x 3.  Psych: Normal  affect. Extremities: No clubbing or cyanosis.  Varicose veins lower legs. HEENT: Normal.   Assessment/Plan: 1. Exertional dyspnea: Patient reports chronic exertional dyspnea and fatigue.  No chest pain.  Lexiscan Sestamibi showed no evidence for ischemia or infarction.  Suspect dyspnea is primarily due to obesity and deconditioning.  2. Hyperlipidemia: Triglycerides much lower than in 8/13.  I had him increase his atorvastatin to 40 mg daily, will check lipids/LFTs in 8/14.   3. ASA allergy: Patient saw an allergist.  It is ok for him to take ASA 81 mg daily.   4. Preoperative evaluation: No further workup prior to surgery.  He needs to quit smoking before surgery if possible.  5. Smoking: Patient is going to get nicotine patches.   Marca Ancona 59/26/2014 9:19 PM

## 2012-08-03 DIAGNOSIS — M169 Osteoarthritis of hip, unspecified: Secondary | ICD-10-CM | POA: Diagnosis not present

## 2012-08-29 ENCOUNTER — Other Ambulatory Visit (HOSPITAL_COMMUNITY): Payer: Self-pay | Admitting: Specialist

## 2012-08-29 DIAGNOSIS — M169 Osteoarthritis of hip, unspecified: Secondary | ICD-10-CM | POA: Diagnosis not present

## 2012-08-30 ENCOUNTER — Encounter: Payer: Self-pay | Admitting: Internal Medicine

## 2012-08-30 ENCOUNTER — Ambulatory Visit (INDEPENDENT_AMBULATORY_CARE_PROVIDER_SITE_OTHER): Payer: 59 | Admitting: Internal Medicine

## 2012-08-30 VITALS — BP 140/80 | HR 88 | Temp 98.0°F | Resp 20 | Ht 71.0 in | Wt 278.0 lb

## 2012-08-30 DIAGNOSIS — Z23 Encounter for immunization: Secondary | ICD-10-CM | POA: Diagnosis not present

## 2012-08-30 DIAGNOSIS — F17200 Nicotine dependence, unspecified, uncomplicated: Secondary | ICD-10-CM

## 2012-08-30 DIAGNOSIS — E119 Type 2 diabetes mellitus without complications: Secondary | ICD-10-CM | POA: Diagnosis not present

## 2012-08-30 DIAGNOSIS — E785 Hyperlipidemia, unspecified: Secondary | ICD-10-CM | POA: Diagnosis not present

## 2012-08-30 DIAGNOSIS — I10 Essential (primary) hypertension: Secondary | ICD-10-CM | POA: Diagnosis not present

## 2012-08-30 DIAGNOSIS — M1990 Unspecified osteoarthritis, unspecified site: Secondary | ICD-10-CM

## 2012-08-30 DIAGNOSIS — G8929 Other chronic pain: Secondary | ICD-10-CM

## 2012-08-30 DIAGNOSIS — E669 Obesity, unspecified: Secondary | ICD-10-CM

## 2012-08-30 DIAGNOSIS — Z86010 Personal history of colonic polyps: Secondary | ICD-10-CM

## 2012-08-30 DIAGNOSIS — Z0000 Encounter for general adult medical examination without abnormal findings: Secondary | ICD-10-CM

## 2012-08-30 LAB — COMPREHENSIVE METABOLIC PANEL
ALT: 35 U/L (ref 0–53)
AST: 30 U/L (ref 0–37)
Albumin: 4.1 g/dL (ref 3.5–5.2)
Alkaline Phosphatase: 51 U/L (ref 39–117)
BUN: 16 mg/dL (ref 6–23)
CO2: 29 mEq/L (ref 19–32)
Calcium: 9.4 mg/dL (ref 8.4–10.5)
Chloride: 95 mEq/L — ABNORMAL LOW (ref 96–112)
Creatinine, Ser: 0.7 mg/dL (ref 0.4–1.5)
GFR: 118.76 mL/min (ref >60.00)
Glucose, Bld: 129 mg/dL — ABNORMAL HIGH (ref 70–99)
Potassium: 4.2 mEq/L (ref 3.5–5.1)
Sodium: 134 mEq/L — ABNORMAL LOW (ref 135–145)
Total Bilirubin: 0.8 mg/dL (ref 0.3–1.2)
Total Protein: 7.2 g/dL (ref 6.0–8.3)

## 2012-08-30 LAB — MICROALBUMIN / CREATININE URINE RATIO
Creatinine,U: 271.4 mg/dL
Microalb Creat Ratio: 0.3 mg/g (ref 0.0–30.0)
Microalb, Ur: 0.9 mg/dL (ref 0.0–1.9)

## 2012-08-30 LAB — PSA: PSA: 0.14 ng/mL (ref 0.10–4.00)

## 2012-08-30 LAB — HEMOGLOBIN A1C: Hgb A1c MFr Bld: 7.5 % — ABNORMAL HIGH (ref 4.6–6.5)

## 2012-08-30 LAB — TSH: TSH: 1.81 u[IU]/mL (ref 0.35–5.50)

## 2012-08-30 MED ORDER — GLUCOSE BLOOD VI STRP: 1.0000 | ORAL_STRIP | Freq: Every day | Status: DC | PRN

## 2012-08-30 NOTE — Patient Instructions (Addendum)
Limit your sodium (Salt) intake  Schedule your colonoscopy to help detect colon cancer.  Please see your eye doctor yearly to check for diabetic eye damage   Please check your hemoglobin A1c every 3 months  You need to lose weight.  Consider a lower calorie diet and regular exercise.    It is important that you exercise regularly, at least 20 minutes 3 to 4 times per week.  If you develop chest pain or shortness of breath seek  medical attention.

## 2012-08-30 NOTE — Progress Notes (Signed)
Subjective:    Patient ID: [name], male    DOB: [birth year], 57 y.o.   MRN: [id number]  HPI  57 year old patient who is seen today for a wellness exam. He has been evaluated by cardiology recently preoperatively for a left hip replacement surgery. Evaluation included a nuclear stress test that was normal. Medical problems include morbid obesity ongoing tobacco use type 2 diabetes hypertension and dyslipidemia. He also has a history of colonic polyps and has received a recall letter from GI for a followup colonoscopy. He's had a recent lipid panel about 2 months ago and atorvastatin up titrated to 40 mg daily  Past Medical History  Diagnosis Date  . History of hip replacement, total   . Diabetes mellitus   . Hypertension   . Hyperlipidemia   . GERD (gastroesophageal reflux disease)   . Obesity   . Chronic pain   . History of colonic polyps   . RBBB (right bundle branch block with left anterior fascicular block)   . 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 Left anterior hemiblock   . Adenomatous polyp     History   Social History  . Marital Status: Married    Spouse Name: N/A    Number of Children: N/A  . Years of Education: N/A   Occupational History  . Not on file.   Social History Main Topics  . Smoking status: Former Smoker    Quit date: 01/04/2007  . Smokeless tobacco: Not on file  . Alcohol Use: Not on file     Comment: recovering alcoholic  . Drug Use: Not on file  . Sexual Activity: Not on file   Other Topics Concern  . Not on file   Social History Narrative   Recovering alcoholic      Minister     Past Surgical History  Procedure Laterality Date  . Hip surgery  12/29/09    R THR  (Nitka)  . Appendectomy    . Foot surgery    . Cervical laminectomy    . Cervical fusion    . Hip surgery      bilateral  . Total knee arthroplasty  01/2008    left knee    Family History  Problem Relation Age of Onset  . Cancer Mother     lung   . Crohn's disease Father   . Alcohol abuse Brother     Allergies  Allergen Reactions  . Aspirin     REACTION: unspecified  . Codeine     REACTION: itch  . Naproxen     REACTION: anaphylaxis    Current Outpatient Prescriptions on File Prior to Visit  Medication Sig Dispense Refill  . aspirin 81 MG tablet Take 81 mg by mouth daily.      Marland Kitchen atorvastatin (LIPITOR) 40 MG tablet Take 1 tablet (40 mg total) by mouth daily.  90 tablet  3  . diclofenac sodium (VOLTAREN) 1 % GEL Apply topically.        Marland Kitchen glimepiride (AMARYL) 4 MG tablet Take 1 tablet (4 mg total) by mouth daily as needed.  90 tablet  3  . Insulin Pen Needle (NOVOTWIST) 32G X 5 MM MISC 1 each by Does not apply route as directed.  100 each  3  . lidocaine (LIDODERM) 5 % Place 1 patch onto the skin as needed.       . Liraglutide (VICTOZA) 18 MG/3ML SOLN Inject 0.6mg  daily for 1  week then 1.2 mg daily for 1 week then 1.8 mg daily as directed  6 mL  6  . lisinopril-hydrochlorothiazide (PRINZIDE,ZESTORETIC) 20-25 MG per tablet Take 1 tablet by mouth daily.  90 tablet  3  . metFORMIN (GLUCOPHAGE) 1000 MG tablet Take 1 tablet (1,000 mg total) by mouth 2 (two) times daily with a meal.  180 tablet  3  . methadone (DOLOPHINE) 10 MG tablet Take 1 tablet (10 mg total) by mouth as directed. 1-4 by mouth daily  90 tablet  0  . methocarbamol (ROBAXIN) 750 MG tablet Take 1 tablet (750 mg total) by mouth every 6 (six) hours as needed.  90 tablet  4  . omeprazole (PRILOSEC) 20 MG capsule Take 1 capsule (20 mg total) by mouth daily.  90 capsule  4  . ondansetron (ZOFRAN-ODT) 4 MG disintegrating tablet Take 1 tablet (4 mg total) by mouth every 8 (eight) hours as needed.  20 tablet  1  . oxyCODONE-acetaminophen (PERCOCET/ROXICET) 5-325 MG per tablet Take 1 tablet by mouth as needed.       . penicillin v potassium (VEETID) 500 MG tablet as directed.      . pioglitazone (ACTOS) 45 MG tablet Take 1 tablet (45 mg total) by mouth daily.  90 tablet  3  .  vardenafil (LEVITRA) 20 MG tablet Take 20 mg by mouth as needed.       No current facility-administered medications on file prior to visit.    BP 140/80  Pulse 88  Temp(Src) 98 F (36.7 C) (Oral)  Resp 20  Ht 5\' 11"  (1.803 m)  Wt 278 lb (126.1 kg)  BMI 38.79 kg/m2  SpO2 98%       Review of Systems  Constitutional: Positive for activity change. Negative for fever, chills, appetite change and fatigue.  HENT: Negative for hearing loss, ear pain, congestion, sore throat, trouble swallowing, neck stiffness, dental problem, voice change and tinnitus.   Eyes: Negative for pain, discharge and visual disturbance.  Respiratory: Negative for cough, chest tightness, wheezing and stridor.   Cardiovascular: Negative for chest pain, palpitations and leg swelling.  Gastrointestinal: Negative for nausea, vomiting, abdominal pain, diarrhea, constipation, blood in stool and abdominal distention.  Genitourinary: Negative for urgency, hematuria, flank pain, discharge, difficulty urinating and genital sores.  Musculoskeletal: Positive for back pain, joint swelling, arthralgias and gait problem. Negative for myalgias.  Skin: Negative for rash.  Neurological: Negative for dizziness, syncope, speech difficulty, weakness, numbness and headaches.  Hematological: Negative for adenopathy. Does not bruise/bleed easily.  Psychiatric/Behavioral: Negative for behavioral problems and dysphoric mood. The patient is not nervous/anxious.        Objective:   Physical Exam  Constitutional: He appears well-developed and well-nourished.  Weight 278 Blood pressure 130/80 in both arms  HENT:  Head: Normocephalic and atraumatic.  Right Ear: External ear normal.  Left Ear: External ear normal.  Nose: Nose normal.  Mouth/Throat: Oropharynx is clear and moist.  Eyes: Conjunctivae and EOM are normal. Pupils are equal, round, and reactive to light. No scleral icterus.  Neck: Normal range of motion. Neck supple. No  JVD present. No thyromegaly present.  Cardiovascular: Regular rhythm, normal heart sounds and intact distal pulses.  Exam reveals no gallop and no friction rub.   No murmur heard. Pulmonary/Chest: Effort normal and breath sounds normal. He exhibits no tenderness.  Abdominal: Soft. Bowel sounds are normal. He exhibits no distension and no mass. There is no tenderness.  Genitourinary: Prostate normal and penis normal.  Guaiac negative stool.  Musculoskeletal: Normal range of motion. He exhibits no edema and no tenderness.  Lymphadenopathy:    He has no cervical adenopathy.  Neurological: He is alert. He has normal reflexes. No cranial nerve deficit. Coordination normal.  Skin: Skin is warm and dry. No rash noted.  Psychiatric: He has a normal mood and affect. His behavior is normal.          Assessment & Plan:   Preventive health examination Tobacco abuse. Smoking cessation encouraged Dyslipidemia. Atorvastatin increased to 40 mg daily Diabetes mellitus. Will check a hemoglobin A1c and urine for microalbumin. Needs eye examination Hypertension Morbid obesity. Weight loss encouraged Osteoarthritis Colonic polyps. Followup colonoscopy encouraged. He has received a recall letter from GI

## 2012-09-06 ENCOUNTER — Encounter (HOSPITAL_COMMUNITY)
Admission: RE | Admit: 2012-09-06 | Discharge: 2012-09-06 | Disposition: A | Discharge status: Home / Self Care | Payer: 59 | Source: Ambulatory Visit | Attending: Specialist | Admitting: Specialist

## 2012-09-06 ENCOUNTER — Encounter (HOSPITAL_COMMUNITY): Payer: Self-pay

## 2012-09-06 ENCOUNTER — Encounter (HOSPITAL_COMMUNITY)
Admission: RE | Admit: 2012-09-06 | Discharge: 2012-09-06 | Disposition: A | Discharge status: Home / Self Care | Payer: 59 | Source: Ambulatory Visit | Attending: Anesthesiology | Admitting: Anesthesiology

## 2012-09-06 DIAGNOSIS — Z01818 Encounter for other preprocedural examination: Secondary | ICD-10-CM | POA: Insufficient documentation

## 2012-09-06 DIAGNOSIS — J42 Unspecified chronic bronchitis: Secondary | ICD-10-CM | POA: Diagnosis not present

## 2012-09-06 DIAGNOSIS — Z01812 Encounter for preprocedural laboratory examination: Secondary | ICD-10-CM | POA: Insufficient documentation

## 2012-09-06 LAB — CBC
HCT: 47.3 % (ref 39.0–52.0)
Hemoglobin: 16.2 g/dL (ref 13.0–17.0)
MCH: 30.3 pg (ref 26.0–34.0)
MCHC: 34.2 g/dL (ref 30.0–36.0)
MCV: 88.4 fL (ref 78.0–100.0)
Platelets: 206 10*3/uL (ref 150–400)
RBC: 5.35 MIL/uL (ref 4.22–5.81)
RDW: 14 % (ref 11.5–15.5)
WBC: 9.6 10*3/uL (ref 4.0–10.5)

## 2012-09-06 LAB — URINALYSIS, ROUTINE W REFLEX MICROSCOPIC
Bilirubin Urine: NEGATIVE
Glucose, UA: NEGATIVE mg/dL
Hgb urine dipstick: NEGATIVE
Ketones, ur: NEGATIVE mg/dL
Leukocytes, UA: NEGATIVE
Nitrite: NEGATIVE
Protein, ur: NEGATIVE mg/dL
Specific Gravity, Urine: 1.019 (ref 1.005–1.030)
Urobilinogen, UA: 0.2 mg/dL (ref 0.0–1.0)
pH: 7 (ref 5.0–8.0)

## 2012-09-06 LAB — SURGICAL PCR SCREEN
MRSA, PCR: POSITIVE — AB
Staphylococcus aureus: POSITIVE — AB

## 2012-09-06 LAB — PROTIME-INR
INR: 0.97 (ref 0.00–1.49)
Prothrombin Time: 12.7 seconds (ref 11.6–15.2)

## 2012-09-06 LAB — BASIC METABOLIC PANEL
BUN: 17 mg/dL (ref 6–23)
CO2: 28 mEq/L (ref 19–32)
Calcium: 9.5 mg/dL (ref 8.4–10.5)
Chloride: 96 mEq/L (ref 96–112)
Creatinine, Ser: 0.93 mg/dL (ref 0.50–1.35)
GFR calc Af Amer: >90 mL/min (ref >90)
GFR calc non Af Amer: >90 mL/min (ref >90)
Glucose, Bld: 117 mg/dL — ABNORMAL HIGH (ref 70–99)
Potassium: 4.2 mEq/L (ref 3.5–5.1)
Sodium: 136 mEq/L (ref 135–145)

## 2012-09-06 LAB — TYPE AND SCREEN
ABO/RH(D): A POS
Antibody Screen: NEGATIVE

## 2012-09-06 LAB — APTT: aPTT: 29 seconds (ref 24–37)

## 2012-09-06 MED ORDER — CHLORHEXIDINE GLUCONATE 4 % EX LIQD: 60.0000 mL | Freq: Once | CUTANEOUS | Status: DC

## 2012-09-06 NOTE — Progress Notes (Signed)
This patient scored at an elevated risk for obstructive sleep apnea using the STOP BANG TOOL during a pre surgical testing 

## 2012-09-06 NOTE — Pre-Procedure Instructions (Signed)
[name]  09/06/2012   Your procedure is scheduled on:  September 10, 2012  Report to [facility] at 5:30 AM.  Call this number if you have problems the morning of surgery: [phone]   Remember:   Do not eat food or drink liquids after midnight.   Take these medicines the morning of surgery with A SIP OF WATER: omeprazole (PRILOSEC), pain pill as needed, muscle relaxer as needed, ondansetron (ZOFRAN-ODT) as  needed   Do not wear jewelry, make-up or nail polish.  Do not wear lotions, powders, or perfumes. You may wear deodorant.  Do not shave 48 hours prior to surgery. Men may shave face and neck.  Do not bring valuables to the hospital.  [facility] is not responsible for any belongings or valuables.  Contacts, dentures or bridgework may not be worn into surgery.  Leave suitcase in the car. After surgery it may be brought to your room.  For patients admitted to the hospital, checkout time is 11:00 AM the day of  discharge.   Patients discharged the day of surgery will not be allowed to drive  home.  Name and phone number of your driver:   Special Instructions: Shower using CHG 2 nights before surgery and the night before surgery.  If you shower the day of surgery use CHG.  Use special wash - you have one bottle of CHG for all showers.  You should use approximately 1/3 of the bottle for each shower.   Please read over the following fact sheets that you were given: Pain Booklet, Coughing and Deep Breathing, Blood Transfusion Information and Surgical Site Infection Prevention

## 2012-09-07 NOTE — H&P (Addendum)
TOTAL HIP ADMISSION H&P  Patient is admitted for left total hip arthroplasty.  Subjective:  Chief Complaint: left hip pain  HPI: [name], 57 y.o. male, has a history of pain and functional disability in the left hip(s) due to arthritis and retained hardware and patient has failed non-surgical conservative treatments for greater than 12 weeks to include corticosteriod injections, use of assistive devices and activity modification.  Onset of symptoms was gradual starting 5 years ago with gradually worsening course since that time.The patient noted prior procedures of the hip to include trochanteric nailing  on the left hip(s).  Patient currently rates pain in the left hip at 8 out of 10 with activity. Patient has night pain, worsening of pain with activity and weight bearing, trendelenberg gait and pain that interfers with activities of daily living. Patient has evidence of subchondral sclerosis, joint space narrowing and retained troch nail of the left femur. by imaging studies. This condition presents safety issues increasing the risk of falls. This patient has had failure of previous osteotomy.  There is no current active infection.  Patient Active Problem List   Diagnosis Date Noted  . Smoking 07/28/2012  . Exertional dyspnea 06/12/2012  . Obesity, unspecified 02/28/2012  . Chronic back pain 02/28/2012  . HEPATOMEGALY 01/15/2008  . DEGENERATIVE JOINT DISEASE 10/15/2007  . GASTROENTERITIS 08/28/2007  . GERD 07/13/2007  . CELLULITIS, RIGHT LEG 06/26/2007  . SINUSITIS 09/12/2006  . SEBACEOUS CYST, INFECTED 08/24/2006  . DIABETES MELLITUS, TYPE II 08/02/2006  . HYPERLIPIDEMIA 08/02/2006  . HYPERTENSION 08/02/2006  . COLONIC POLYPS, HX OF 08/02/2006   Past Medical History  Diagnosis Date  . History of hip replacement, total   . Diabetes mellitus   . Hypertension   . Hyperlipidemia   . GERD (gastroesophageal reflux disease)   . Obesity   . Chronic pain   . History of colonic  polyps   . RBBB (right bundle branch block with left anterior fascicular block)   . 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 Left anterior hemiblock   . Adenomatous polyp     Past Surgical History  Procedure Laterality Date  . Hip surgery  12/29/09    R THR  (Glorie Dowlen)  . Appendectomy    . Foot surgery    . Cervical laminectomy    . Cervical fusion    . Hip surgery      bilateral  . Total knee arthroplasty  01/2008    left knee    Prescriptions prior to admission  Medication Sig Dispense Refill  . aspirin 81 MG tablet Take 81 mg by mouth daily.      Marland Kitchen atorvastatin (LIPITOR) 40 MG tablet Take 1 tablet (40 mg total) by mouth daily.  90 tablet  3  . diclofenac sodium (VOLTAREN) 1 % GEL Apply topically.        Marland Kitchen glimepiride (AMARYL) 4 MG tablet Take 1 tablet (4 mg total) by mouth daily as needed.  90 tablet  3  . glucose blood test strip 1 each by Other route daily as needed for other. Use as instructed  100 each  12  . Insulin Pen Needle (NOVOTWIST) 32G X 5 MM MISC 1 each by Does not apply route as directed.  100 each  3  . lidocaine (LIDODERM) 5 % Place 1 patch onto the skin as needed.       . Liraglutide (VICTOZA) 18 MG/3ML SOLN Inject 0.6mg  daily for 1 week  then 1.2 mg daily for 1 week then 1.8 mg daily as directed  6 mL  6  . lisinopril-hydrochlorothiazide (PRINZIDE,ZESTORETIC) 20-25 MG per tablet Take 1 tablet by mouth every morning.      . metFORMIN (GLUCOPHAGE) 1000 MG tablet Take 1 tablet (1,000 mg total) by mouth 2 (two) times daily with a meal.  180 tablet  3  . methadone (DOLOPHINE) 10 MG tablet Take 10 mg by mouth every morning. 1-4 by mouth daily      . methocarbamol (ROBAXIN) 750 MG tablet Take 1 tablet (750 mg total) by mouth every 6 (six) hours as needed.  90 tablet  4  . Multiple Vitamins-Minerals (MULTIVITAMIN WITH MINERALS) tablet Take 1 tablet by mouth daily.      Marland Kitchen omeprazole (PRILOSEC) 20 MG capsule Take 1 capsule (20 mg total) by mouth daily.  90  capsule  4  . ondansetron (ZOFRAN-ODT) 4 MG disintegrating tablet Take 1 tablet (4 mg total) by mouth every 8 (eight) hours as needed.  20 tablet  1  . oxyCODONE-acetaminophen (PERCOCET/ROXICET) 5-325 MG per tablet Take 1 tablet by mouth as needed.       . pioglitazone (ACTOS) 45 MG tablet Take 1 tablet (45 mg total) by mouth daily.  90 tablet  3  . vardenafil (LEVITRA) 20 MG tablet Take 20 mg by mouth as needed.       Allergies  Allergen Reactions  . Aspirin     REACTION: unspecified  . Codeine     REACTION: itch  . Morphine And Related Nausea And Vomiting  . Naproxen     REACTION: anaphylaxis    History  Substance Use Topics  . Smoking status: Former Smoker    Quit date: 01/04/2007  . Smokeless tobacco: Not on file  . Alcohol Use: Not on file     Comment: recovering alcoholic    Family History  Problem Relation Age of Onset  . Cancer Mother     lung  . Crohn's disease Father   . Alcohol abuse Brother      Review of Systems  Constitutional: Negative.   HENT: Negative.   Eyes: Negative.   Respiratory: Negative.   Cardiovascular: Negative.   Gastrointestinal: Negative.   Genitourinary: Negative.   Musculoskeletal: Positive for joint pain.  Skin: Negative.   Neurological: Negative.   Endo/Heme/Allergies: Negative.   Psychiatric/Behavioral: Negative.     Objective:  Physical Exam  Constitutional: He is oriented to person, place, and time. He appears well-developed and well-nourished.  HENT:  Head: Normocephalic and atraumatic.  Eyes: EOM are normal. Pupils are equal, round, and reactive to light.  Neck: Normal range of motion. Neck supple.  Cardiovascular: Normal rate, regular rhythm and normal heart sounds.   Respiratory: Effort normal and breath sounds normal.  GI: Soft. Bowel sounds are normal.  Musculoskeletal:  Left hip with painful and limited ROM.  Antalgic gait.    Neurological: He is alert and oriented to person, place, and time.  Skin: Skin is warm  and dry.  Psychiatric: He has a normal mood and affect.    Vital signs in last 24 hours: Temp:  [97.8 F (36.6 C)] 97.8 F (36.6 C) (09/08 0627) Pulse Rate:  [79] 79 (09/08 0627) Resp:  [20] 20 (09/08 0627) BP: (142)/(69) 142/69 mmHg (09/08 0627) SpO2:  [97 %] 97 % (09/08 0627)  Labs:   Estimated body mass index is 36.34 kg/(m^2) as calculated from the following:   Height as of 09/06/12: 6' (  1.829 m).   Weight as of 07/27/12: 121.564 kg (268 lb).   Imaging Review Plain radiographs demonstrate moderate degenerative joint disease of the left hip(s). The bone quality appears to be adequate for age and reported activity level.  Assessment/Plan:  End stage arthritis, left hip(s) with painful retained hardware.  The patient history, physical examination, clinical judgement of the provider and imaging studies are consistent with end stage degenerative joint disease of the left hip(s) and total hip arthroplasty is deemed medically necessary. The treatment options including medical management, injection therapy, arthroscopy and arthroplasty were discussed at length. The risks and benefits of total hip arthroplasty were presented and reviewed. The risks due to aseptic loosening, infection, stiffness, dislocation/subluxation,  thromboembolic complications and other imponderables were discussed.  The patient acknowledged the explanation, agreed to proceed with the plan and consent was signed. Patient is being admitted for inpatient treatment for surgery, pain control, PT, OT, prophylactic antibiotics, VTE prophylaxis, progressive ambulation and ADL's and discharge planning.The patient is planning to be discharged home with home health services Patient was seen and examined in the preop holding area. There has been no interval  Change in this patient's exam preop  history and physical exam  Lab tests and images have been examined and reviewed.  The Risks benefits and alternative treatments have been  discussed  extensively,questions answered.  The patient has elected to undergo the discussed surgical treatment.

## 2012-09-09 MED ORDER — DEXTROSE 5 % IV SOLN
3.0000 g | INTRAVENOUS | Status: AC
  Administered 2012-09-10: 2 g via INTRAVENOUS
  Administered 2012-09-10: 3 g via INTRAVENOUS
  Filled 2012-09-09: qty 3000

## 2012-09-10 ENCOUNTER — Encounter (HOSPITAL_COMMUNITY)
Admission: RE | Disposition: A | Discharge status: Home Health | Payer: Self-pay | Source: Ambulatory Visit | Attending: Specialist

## 2012-09-10 ENCOUNTER — Inpatient Hospital Stay (HOSPITAL_COMMUNITY)
Admission: RE | Admit: 2012-09-10 | Discharge: 2012-09-14 | DRG: 470 | Disposition: A | Discharge status: Home Health | Payer: 59 | Source: Ambulatory Visit | Attending: Specialist | Admitting: Specialist

## 2012-09-10 ENCOUNTER — Encounter (HOSPITAL_COMMUNITY): Payer: Self-pay | Admitting: Certified Registered"

## 2012-09-10 ENCOUNTER — Inpatient Hospital Stay (HOSPITAL_COMMUNITY): Payer: 59 | Admitting: Certified Registered"

## 2012-09-10 ENCOUNTER — Inpatient Hospital Stay (HOSPITAL_COMMUNITY): Payer: 59

## 2012-09-10 DIAGNOSIS — M1610 Unilateral primary osteoarthritis, unspecified hip: Principal | ICD-10-CM | POA: Diagnosis present

## 2012-09-10 DIAGNOSIS — M25559 Pain in unspecified hip: Secondary | ICD-10-CM | POA: Diagnosis not present

## 2012-09-10 DIAGNOSIS — K219 Gastro-esophageal reflux disease without esophagitis: Secondary | ICD-10-CM | POA: Diagnosis not present

## 2012-09-10 DIAGNOSIS — M169 Osteoarthritis of hip, unspecified: Principal | ICD-10-CM | POA: Diagnosis present

## 2012-09-10 DIAGNOSIS — D62 Acute posthemorrhagic anemia: Secondary | ICD-10-CM | POA: Diagnosis not present

## 2012-09-10 DIAGNOSIS — M1612 Unilateral primary osteoarthritis, left hip: Secondary | ICD-10-CM | POA: Diagnosis present

## 2012-09-10 DIAGNOSIS — M9702XA Periprosthetic fracture around internal prosthetic left hip joint, initial encounter: Secondary | ICD-10-CM

## 2012-09-10 DIAGNOSIS — Z472 Encounter for removal of internal fixation device: Secondary | ICD-10-CM

## 2012-09-10 DIAGNOSIS — T84099A Other mechanical complication of unspecified internal joint prosthesis, initial encounter: Secondary | ICD-10-CM | POA: Diagnosis not present

## 2012-09-10 DIAGNOSIS — E119 Type 2 diabetes mellitus without complications: Secondary | ICD-10-CM | POA: Diagnosis present

## 2012-09-10 DIAGNOSIS — F17200 Nicotine dependence, unspecified, uncomplicated: Secondary | ICD-10-CM | POA: Diagnosis present

## 2012-09-10 DIAGNOSIS — Z96649 Presence of unspecified artificial hip joint: Secondary | ICD-10-CM | POA: Diagnosis not present

## 2012-09-10 DIAGNOSIS — S72309A Unspecified fracture of shaft of unspecified femur, initial encounter for closed fracture: Secondary | ICD-10-CM | POA: Diagnosis not present

## 2012-09-10 DIAGNOSIS — IMO0002 Reserved for concepts with insufficient information to code with codable children: Secondary | ICD-10-CM | POA: Diagnosis not present

## 2012-09-10 DIAGNOSIS — I10 Essential (primary) hypertension: Secondary | ICD-10-CM | POA: Diagnosis present

## 2012-09-10 DIAGNOSIS — Z471 Aftercare following joint replacement surgery: Secondary | ICD-10-CM | POA: Diagnosis not present

## 2012-09-10 DIAGNOSIS — S72009D Fracture of unspecified part of neck of unspecified femur, subsequent encounter for closed fracture with routine healing: Secondary | ICD-10-CM

## 2012-09-10 HISTORY — PX: TOTAL HIP ARTHROPLASTY: SHX124

## 2012-09-10 HISTORY — PX: HARDWARE REMOVAL: SHX979

## 2012-09-10 LAB — GLUCOSE, CAPILLARY
Glucose-Capillary: 136 mg/dL — ABNORMAL HIGH (ref 70–99)
Glucose-Capillary: 219 mg/dL — ABNORMAL HIGH (ref 70–99)
Glucose-Capillary: 192 mg/dL — ABNORMAL HIGH (ref 70–99)
Glucose-Capillary: 150 mg/dL — ABNORMAL HIGH (ref 70–99)

## 2012-09-10 SURGERY — ARTHROPLASTY, HIP, TOTAL,POSTERIOR APPROACH
Anesthesia: General | Site: Hip | Laterality: Left | Wound class: Clean

## 2012-09-10 MED ORDER — PANTOPRAZOLE SODIUM 40 MG PO TBEC
40.0000 mg | DELAYED_RELEASE_TABLET | Freq: Every day | ORAL | Status: DC
  Administered 2012-09-10 – 2012-09-14 (×5): 40 mg via ORAL
  Filled 2012-09-10 (×5): qty 1

## 2012-09-10 MED ORDER — HYDROMORPHONE HCL PF 1 MG/ML IJ SOLN
INTRAMUSCULAR | Status: AC
  Administered 2012-09-10: 0.5 mg via INTRAVENOUS
  Filled 2012-09-10: qty 1

## 2012-09-10 MED ORDER — METOCLOPRAMIDE HCL 5 MG/ML IJ SOLN
5.0000 mg | Freq: Three times a day (TID) | INTRAMUSCULAR | Status: DC | PRN
  Filled 2012-09-10: qty 2

## 2012-09-10 MED ORDER — SODIUM CHLORIDE 0.9 % IR SOLN
Status: DC | PRN
  Administered 2012-09-10: 1000 mL

## 2012-09-10 MED ORDER — INSULIN ASPART 100 UNIT/ML ~~LOC~~ SOLN
0.0000 [IU] | Freq: Three times a day (TID) | SUBCUTANEOUS | Status: DC
  Administered 2012-09-10: 3 [IU] via SUBCUTANEOUS
  Administered 2012-09-11: 5 [IU] via SUBCUTANEOUS
  Administered 2012-09-11 (×2): 3 [IU] via SUBCUTANEOUS
  Administered 2012-09-12: 5 [IU] via SUBCUTANEOUS
  Administered 2012-09-12 – 2012-09-13 (×3): 3 [IU] via SUBCUTANEOUS
  Administered 2012-09-13: 5 [IU] via SUBCUTANEOUS
  Administered 2012-09-13: 3 [IU] via SUBCUTANEOUS
  Administered 2012-09-14: 5 [IU] via SUBCUTANEOUS

## 2012-09-10 MED ORDER — ONDANSETRON HCL 4 MG/2ML IJ SOLN: 4.0000 mg | Freq: Four times a day (QID) | INTRAMUSCULAR | Status: DC | PRN

## 2012-09-10 MED ORDER — DIPHENHYDRAMINE HCL 12.5 MG/5ML PO ELIX: 12.5000 mg | ORAL_SOLUTION | Freq: Four times a day (QID) | ORAL | Status: DC | PRN

## 2012-09-10 MED ORDER — SODIUM CHLORIDE 0.9 % IV SOLN
INTRAVENOUS | Status: DC | PRN
  Administered 2012-09-10: 11:00:00 via INTRAVENOUS

## 2012-09-10 MED ORDER — ACETAMINOPHEN 650 MG RE SUPP: 650.0000 mg | Freq: Four times a day (QID) | RECTAL | Status: DC | PRN

## 2012-09-10 MED ORDER — FLEET ENEMA 7-19 GM/118ML RE ENEM: 1.0000 | ENEMA | Freq: Once | RECTAL | Status: AC | PRN

## 2012-09-10 MED ORDER — METOCLOPRAMIDE HCL 10 MG PO TABS
5.0000 mg | ORAL_TABLET | Freq: Three times a day (TID) | ORAL | Status: DC | PRN
  Administered 2012-09-13: 10 mg via ORAL
  Filled 2012-09-10: qty 1

## 2012-09-10 MED ORDER — POLYETHYLENE GLYCOL 3350 17 G PO PACK
17.0000 g | PACK | Freq: Every day | ORAL | Status: DC | PRN
  Administered 2012-09-13: 17 g via ORAL
  Filled 2012-09-10: qty 1

## 2012-09-10 MED ORDER — OXYCODONE HCL 5 MG PO TABS: 5.0000 mg | ORAL_TABLET | Freq: Once | ORAL | Status: DC | PRN

## 2012-09-10 MED ORDER — MEPERIDINE HCL 25 MG/ML IJ SOLN: 6.2500 mg | INTRAMUSCULAR | Status: DC | PRN

## 2012-09-10 MED ORDER — ALBUMIN HUMAN 5 % IV SOLN
INTRAVENOUS | Status: DC | PRN
  Administered 2012-09-10: 13:00:00 via INTRAVENOUS

## 2012-09-10 MED ORDER — RIVAROXABAN 10 MG PO TABS
10.0000 mg | ORAL_TABLET | Freq: Every day | ORAL | Status: DC
  Administered 2012-09-11 – 2012-09-14 (×4): 10 mg via ORAL
  Filled 2012-09-10 (×5): qty 1

## 2012-09-10 MED ORDER — ONDANSETRON HCL 4 MG/2ML IJ SOLN
INTRAMUSCULAR | Status: DC | PRN
  Administered 2012-09-10: 4 mg via INTRAVENOUS

## 2012-09-10 MED ORDER — MIDAZOLAM HCL 2 MG/2ML IJ SOLN: 0.5000 mg | Freq: Once | INTRAMUSCULAR | Status: DC | PRN

## 2012-09-10 MED ORDER — MORPHINE SULFATE (PF) 1 MG/ML IV SOLN
INTRAVENOUS | Status: AC
  Filled 2012-09-10: qty 25

## 2012-09-10 MED ORDER — ONDANSETRON HCL 4 MG PO TABS: 4.0000 mg | ORAL_TABLET | Freq: Four times a day (QID) | ORAL | Status: DC | PRN

## 2012-09-10 MED ORDER — OXYCODONE HCL 5 MG/5ML PO SOLN: 5.0000 mg | Freq: Once | ORAL | Status: DC | PRN

## 2012-09-10 MED ORDER — METHOCARBAMOL 100 MG/ML IJ SOLN
500.0000 mg | Freq: Four times a day (QID) | INTRAVENOUS | Status: DC | PRN
  Filled 2012-09-10: qty 5

## 2012-09-10 MED ORDER — FERROUS SULFATE 325 (65 FE) MG PO TABS
325.0000 mg | ORAL_TABLET | Freq: Three times a day (TID) | ORAL | Status: DC
  Administered 2012-09-10 – 2012-09-14 (×11): 325 mg via ORAL
  Filled 2012-09-10 (×14): qty 1

## 2012-09-10 MED ORDER — NALOXONE HCL 0.4 MG/ML IJ SOLN: 0.4000 mg | INTRAMUSCULAR | Status: DC | PRN

## 2012-09-10 MED ORDER — ARTIFICIAL TEARS OP OINT
TOPICAL_OINTMENT | OPHTHALMIC | Status: DC | PRN
  Administered 2012-09-10: 1 via OPHTHALMIC

## 2012-09-10 MED ORDER — DIPHENHYDRAMINE HCL 50 MG/ML IJ SOLN: 12.5000 mg | Freq: Four times a day (QID) | INTRAMUSCULAR | Status: DC | PRN

## 2012-09-10 MED ORDER — ATORVASTATIN CALCIUM 40 MG PO TABS
40.0000 mg | ORAL_TABLET | Freq: Every day | ORAL | Status: DC
  Administered 2012-09-10: 40 mg via ORAL
  Filled 2012-09-10 (×2): qty 1

## 2012-09-10 MED ORDER — CEFAZOLIN SODIUM-DEXTROSE 2-3 GM-% IV SOLR
INTRAVENOUS | Status: AC
  Filled 2012-09-10: qty 50

## 2012-09-10 MED ORDER — LABETALOL HCL 5 MG/ML IV SOLN
INTRAVENOUS | Status: DC | PRN
  Administered 2012-09-10 (×4): 5 mg via INTRAVENOUS

## 2012-09-10 MED ORDER — HYDROCODONE-ACETAMINOPHEN 7.5-325 MG PO TABS: 1.0000 | ORAL_TABLET | ORAL | Status: DC | PRN

## 2012-09-10 MED ORDER — LACTATED RINGERS IV SOLN
INTRAVENOUS | Status: DC | PRN
  Administered 2012-09-10 (×3): via INTRAVENOUS

## 2012-09-10 MED ORDER — BISACODYL 10 MG RE SUPP: 10.0000 mg | Freq: Every day | RECTAL | Status: DC | PRN

## 2012-09-10 MED ORDER — ROCURONIUM BROMIDE 100 MG/10ML IV SOLN
INTRAVENOUS | Status: DC | PRN
  Administered 2012-09-10: 10 mg via INTRAVENOUS
  Administered 2012-09-10: 50 mg via INTRAVENOUS
  Administered 2012-09-10 (×9): 10 mg via INTRAVENOUS

## 2012-09-10 MED ORDER — FENTANYL CITRATE 0.05 MG/ML IJ SOLN
INTRAMUSCULAR | Status: DC | PRN
  Administered 2012-09-10: 100 ug via INTRAVENOUS
  Administered 2012-09-10: 50 ug via INTRAVENOUS
  Administered 2012-09-10 (×3): 100 ug via INTRAVENOUS
  Administered 2012-09-10: 150 ug via INTRAVENOUS
  Administered 2012-09-10: 50 ug via INTRAVENOUS
  Administered 2012-09-10: 150 ug via INTRAVENOUS
  Administered 2012-09-10: 100 ug via INTRAVENOUS
  Administered 2012-09-10: 50 ug via INTRAVENOUS
  Administered 2012-09-10: 150 ug via INTRAVENOUS
  Administered 2012-09-10: 50 ug via INTRAVENOUS
  Administered 2012-09-10: 100 ug via INTRAVENOUS

## 2012-09-10 MED ORDER — GLYCOPYRROLATE 0.2 MG/ML IJ SOLN
INTRAMUSCULAR | Status: DC | PRN
  Administered 2012-09-10: 0.4 mg via INTRAVENOUS

## 2012-09-10 MED ORDER — POTASSIUM CHLORIDE IN NACL 20-0.9 MEQ/L-% IV SOLN
INTRAVENOUS | Status: DC
  Administered 2012-09-10 – 2012-09-11 (×2): via INTRAVENOUS
  Administered 2012-09-12: 20 mL/h via INTRAVENOUS
  Filled 2012-09-10 (×7): qty 1000

## 2012-09-10 MED ORDER — PROPOFOL 10 MG/ML IV BOLUS
INTRAVENOUS | Status: DC | PRN
  Administered 2012-09-10: 100 mg via INTRAVENOUS
  Administered 2012-09-10: 200 mg via INTRAVENOUS

## 2012-09-10 MED ORDER — METHADONE HCL 10 MG PO TABS
10.0000 mg | ORAL_TABLET | Freq: Every morning | ORAL | Status: DC
  Administered 2012-09-11 – 2012-09-14 (×4): 10 mg via ORAL
  Filled 2012-09-10 (×5): qty 1

## 2012-09-10 MED ORDER — CEFAZOLIN SODIUM-DEXTROSE 2-3 GM-% IV SOLR
2.0000 g | INTRAVENOUS | Status: DC
  Filled 2012-09-10: qty 50

## 2012-09-10 MED ORDER — SODIUM CHLORIDE 0.9 % IJ SOLN: 9.0000 mL | INTRAMUSCULAR | Status: DC | PRN

## 2012-09-10 MED ORDER — DIPHENHYDRAMINE HCL 12.5 MG/5ML PO ELIX: 12.5000 mg | ORAL_SOLUTION | ORAL | Status: DC | PRN

## 2012-09-10 MED ORDER — PROMETHAZINE HCL 25 MG/ML IJ SOLN: 6.2500 mg | INTRAMUSCULAR | Status: DC | PRN

## 2012-09-10 MED ORDER — DOCUSATE SODIUM 100 MG PO CAPS
100.0000 mg | ORAL_CAPSULE | Freq: Two times a day (BID) | ORAL | Status: DC
  Administered 2012-09-10 – 2012-09-14 (×8): 100 mg via ORAL
  Filled 2012-09-10 (×10): qty 1

## 2012-09-10 MED ORDER — HYDROMORPHONE HCL PF 1 MG/ML IJ SOLN
0.2500 mg | INTRAMUSCULAR | Status: DC | PRN
  Administered 2012-09-10 (×4): 0.5 mg via INTRAVENOUS

## 2012-09-10 MED ORDER — VANCOMYCIN HCL IN DEXTROSE 1-5 GM/200ML-% IV SOLN
1000.0000 mg | Freq: Once | INTRAVENOUS | Status: DC
  Filled 2012-09-10: qty 200

## 2012-09-10 MED ORDER — INSULIN GLARGINE 100 UNIT/ML ~~LOC~~ SOLN
20.0000 [IU] | Freq: Every day | SUBCUTANEOUS | Status: DC
  Administered 2012-09-11 – 2012-09-12 (×2): 20 [IU] via SUBCUTANEOUS
  Filled 2012-09-10 (×4): qty 0.2

## 2012-09-10 MED ORDER — MORPHINE SULFATE (PF) 1 MG/ML IV SOLN
INTRAVENOUS | Status: DC
  Administered 2012-09-10: 23:00:00 via INTRAVENOUS
  Administered 2012-09-10: 10.5 mg via INTRAVENOUS
  Administered 2012-09-10 (×2): via INTRAVENOUS
  Administered 2012-09-11: 18 mg via INTRAVENOUS
  Administered 2012-09-11: 15.6 mg via INTRAVENOUS
  Administered 2012-09-11 (×4): via INTRAVENOUS
  Administered 2012-09-11: 13 mg via INTRAVENOUS
  Administered 2012-09-12: 15 mg via INTRAVENOUS
  Administered 2012-09-12: 04:00:00 via INTRAVENOUS
  Filled 2012-09-10 (×7): qty 25

## 2012-09-10 MED ORDER — INSULIN ASPART 100 UNIT/ML ~~LOC~~ SOLN
4.0000 [IU] | Freq: Three times a day (TID) | SUBCUTANEOUS | Status: DC
  Administered 2012-09-10 – 2012-09-13 (×9): 4 [IU] via SUBCUTANEOUS

## 2012-09-10 MED ORDER — MENTHOL 3 MG MT LOZG: 1.0000 | LOZENGE | OROMUCOSAL | Status: DC | PRN

## 2012-09-10 MED ORDER — VANCOMYCIN HCL 1000 MG IV SOLR
1000.0000 mg | INTRAVENOUS | Status: DC | PRN
  Administered 2012-09-10: 1000 mg via INTRAVENOUS

## 2012-09-10 MED ORDER — METHADONE HCL 10 MG PO TABS
10.0000 mg | ORAL_TABLET | Freq: Once | ORAL | Status: AC
  Administered 2012-09-10: 10 mg via ORAL

## 2012-09-10 MED ORDER — NEOSTIGMINE METHYLSULFATE 1 MG/ML IJ SOLN
INTRAMUSCULAR | Status: DC | PRN
  Administered 2012-09-10: 3 mg via INTRAVENOUS

## 2012-09-10 MED ORDER — LIDOCAINE HCL (CARDIAC) 20 MG/ML IV SOLN
INTRAVENOUS | Status: DC | PRN
  Administered 2012-09-10: 30 mg via INTRAVENOUS

## 2012-09-10 MED ORDER — ACETAMINOPHEN 325 MG PO TABS: 650.0000 mg | ORAL_TABLET | Freq: Four times a day (QID) | ORAL | Status: DC | PRN

## 2012-09-10 MED ORDER — METHOCARBAMOL 500 MG PO TABS
500.0000 mg | ORAL_TABLET | Freq: Four times a day (QID) | ORAL | Status: DC | PRN
  Administered 2012-09-12 – 2012-09-14 (×4): 500 mg via ORAL
  Filled 2012-09-10 (×4): qty 1

## 2012-09-10 MED ORDER — MIDAZOLAM HCL 5 MG/5ML IJ SOLN
INTRAMUSCULAR | Status: DC | PRN
  Administered 2012-09-10: 2 mg via INTRAVENOUS

## 2012-09-10 MED ORDER — PHENOL 1.4 % MT LIQD: 1.0000 | OROMUCOSAL | Status: DC | PRN

## 2012-09-10 SURGICAL SUPPLY — 81 items
ADH SKN CLS APL DERMABOND .7 (GAUZE/BANDAGES/DRESSINGS) ×1
APL SKNCLS STERI-STRIP NONHPOA (GAUZE/BANDAGES/DRESSINGS) ×1
BENZOIN TINCTURE PRP APPL 2/3 (GAUZE/BANDAGES/DRESSINGS) ×2 IMPLANT
BLADE SAW SAG 73X25 THK (BLADE) ×1
BLADE SAW SGTL 73X25 THK (BLADE) ×1 IMPLANT
BLADE SURG 10 STRL SS (BLADE) ×2 IMPLANT
BRUSH FEMORAL CANAL (MISCELLANEOUS) IMPLANT
BUR SURG 4X8 MED (BURR) ×1 IMPLANT
BURR SURG 4X8 MED (BURR) ×2
CLOTH BEACON ORANGE TIMEOUT ST (SAFETY) ×2 IMPLANT
COVER BACK TABLE 24X17X13 BIG (DRAPES) IMPLANT
COVER SURGICAL LIGHT HANDLE (MISCELLANEOUS) ×2 IMPLANT
CUP SECTOR GRIPTON 58MM (Orthopedic Implant) ×2 IMPLANT
DERMABOND ADVANCED (GAUZE/BANDAGES/DRESSINGS) ×1
DERMABOND ADVANCED .7 DNX12 (GAUZE/BANDAGES/DRESSINGS) ×1 IMPLANT
DRAPE INCISE IOBAN 66X45 STRL (DRAPES) ×2 IMPLANT
DRAPE ORTHO SPLIT 77X108 STRL (DRAPES) ×4
DRAPE SURG ORHT 6 SPLT 77X108 (DRAPES) ×2 IMPLANT
DRAPE U-SHAPE 47X51 STRL (DRAPES) ×2 IMPLANT
DRILL BIT 7/64X5 (BIT) ×2 IMPLANT
DRSG MEPILEX BORDER 4X12 (GAUZE/BANDAGES/DRESSINGS) ×6 IMPLANT
DRSG MEPILEX BORDER 4X8 (GAUZE/BANDAGES/DRESSINGS) ×2 IMPLANT
DURAPREP 26ML APPLICATOR (WOUND CARE) ×2 IMPLANT
ELECT BLADE 6.5 EXT (BLADE) ×2 IMPLANT
ELECT REM PT RETURN 9FT ADLT (ELECTROSURGICAL) ×2
ELECTRODE REM PT RTRN 9FT ADLT (ELECTROSURGICAL) ×1 IMPLANT
ELIMINATOR HOLE APEX DEPUY (Hips) ×2 IMPLANT
EVACUATOR 1/8 PVC DRAIN (DRAIN) ×2 IMPLANT
FACESHIELD LNG OPTICON STERILE (SAFETY) ×4 IMPLANT
FILTER STRAW FLUID ASPIR (MISCELLANEOUS) IMPLANT
GLOVE BIOGEL PI IND STRL 7.5 (GLOVE) ×1 IMPLANT
GLOVE BIOGEL PI INDICATOR 7.5 (GLOVE) ×1
GLOVE ECLIPSE 7.0 STRL STRAW (GLOVE) ×2 IMPLANT
GLOVE ECLIPSE 8.5 STRL (GLOVE) ×4 IMPLANT
GLOVE SURG 8.5 LATEX PF (GLOVE) ×4 IMPLANT
GOWN PREVENTION PLUS LG XLONG (DISPOSABLE) ×4 IMPLANT
GOWN PREVENTION PLUS XXLARGE (GOWN DISPOSABLE) ×4 IMPLANT
GOWN STRL NON-REIN LRG LVL3 (GOWN DISPOSABLE) ×2 IMPLANT
GUIDEWIRE BALL NOSE 100CM (WIRE) ×2 IMPLANT
HANDPIECE INTERPULSE COAX TIP (DISPOSABLE)
HEAD CERAMIC DELTA 36 PLUS 1.5 (Hips) ×2 IMPLANT
IMMOBILIZER KNEE 20 (SOFTGOODS) ×2
IMMOBILIZER KNEE 20 THIGH 36 (SOFTGOODS) ×1 IMPLANT
IMPL SOLUTION BOWED 10IN (Hips) ×1 IMPLANT
IMPLANT SOLUTION BOWED 10IN (Hips) ×2 IMPLANT
KIT BASIN OR (CUSTOM PROCEDURE TRAY) ×2 IMPLANT
KIT ROOM TURNOVER OR (KITS) ×2 IMPLANT
LINER MARATHON 10D 36M (Hips) ×1 IMPLANT
LINER MARATHON 10DEG 36M (Hips) ×2 IMPLANT
MANIFOLD NEPTUNE II (INSTRUMENTS) ×2 IMPLANT
NEEDLE 22X1 1/2 (OR ONLY) (NEEDLE) ×2 IMPLANT
NS IRRIG 1000ML POUR BTL (IV SOLUTION) ×2 IMPLANT
PACK TOTAL JOINT (CUSTOM PROCEDURE TRAY) ×2 IMPLANT
PAD ARMBOARD 7.5X6 YLW CONV (MISCELLANEOUS) ×4 IMPLANT
PASSER SUT SWANSON 36MM LOOP (INSTRUMENTS) ×2 IMPLANT
PIN SECT CUP 56MM (Hips) ×2 IMPLANT
PRESSURIZER FEMORAL UNIV (MISCELLANEOUS) IMPLANT
SCREW 6.5MMX35MM (Screw) ×2 IMPLANT
SCREW PINN CAN 6.5X20 (Screw) ×2 IMPLANT
SET HNDPC FAN SPRY TIP SCT (DISPOSABLE) IMPLANT
SLEEVE CABLE 2M VIT (Orthopedic Implant) ×2 IMPLANT
SLEEVE CABLE 2MM VT (Orthopedic Implant) ×6 IMPLANT
SLEEVE SURGEON STRL (DRAPES) ×2 IMPLANT
SPONGE LAP 4X18 X RAY DECT (DISPOSABLE) ×2 IMPLANT
STAPLER VISISTAT 35W (STAPLE) ×2 IMPLANT
STRIP CLOSURE SKIN 1/2X4 (GAUZE/BANDAGES/DRESSINGS) ×4 IMPLANT
SUCTION FRAZIER TIP 10 FR DISP (SUCTIONS) ×2 IMPLANT
SUT ETHIBOND NAB CT1 #1 30IN (SUTURE) ×12 IMPLANT
SUT VIC AB 0 CT1 27 (SUTURE) ×16
SUT VIC AB 0 CT1 27XBRD ANBCTR (SUTURE) ×8 IMPLANT
SUT VIC AB 1 CT1 27 (SUTURE) ×8
SUT VIC AB 1 CT1 27XBRD ANBCTR (SUTURE) ×4 IMPLANT
SUT VIC AB 2-0 CT1 27 (SUTURE) ×8
SUT VIC AB 2-0 CT1 TAPERPNT 27 (SUTURE) ×4 IMPLANT
SUT VICRYL 4-0 PS2 18IN ABS (SUTURE) ×2 IMPLANT
SYR CONTROL 10ML LL (SYRINGE) ×2 IMPLANT
TOWEL OR 17X24 6PK STRL BLUE (TOWEL DISPOSABLE) ×2 IMPLANT
TOWEL OR 17X26 10 PK STRL BLUE (TOWEL DISPOSABLE) ×2 IMPLANT
TOWER CARTRIDGE SMART MIX (DISPOSABLE) IMPLANT
TRAY FOLEY CATH 16FRSI W/METER (SET/KITS/TRAYS/PACK) ×2 IMPLANT
WATER STERILE IRR 1000ML POUR (IV SOLUTION) ×2 IMPLANT

## 2012-09-10 NOTE — Progress Notes (Signed)
Pt stated he was told by his MD he didn't have to conti mupirocin dos.

## 2012-09-10 NOTE — Progress Notes (Signed)
PT Cancellation Note  Patient Details Name: [name] MRN: [id number] DOB: 11/20/1955   Cancelled Treatment:    Reason Eval/Treat Not Completed: Other (comment) (Awaiting brace).  PT order received - awaiting brace from [employer].  Will return in am for PT evaluation.   Vena Austria 09/10/2012, 5:12 PM Durenda Hurt. Renaldo Fiddler, [facility] Pager [phone]

## 2012-09-10 NOTE — Transfer of Care (Signed)
Immediate Anesthesia Transfer of Care Note  Patient: [name]  Procedure(s) Performed: Procedure(s): REMOVAL OF HARDWARE LEFT PROXIMAL THIGH/FEMUR AND LEFT TOTAL HIP ARTHROPLASTY (Left) HARDWARE REMOVAL (Left)  Patient Location: PACU  Anesthesia Type:General  Level of Consciousness: lethargic and responds to stimulation  Airway & Oxygen Therapy: Patient Spontanous Breathing and Patient connected to nasal cannula oxygen  Post-op Assessment: Report given to PACU RN  Post vital signs: Reviewed and stable  Complications: No apparent anesthesia complications

## 2012-09-10 NOTE — Preoperative (Signed)
Beta Blockers   Reason not to administer Beta Blockers:Not Applicable 

## 2012-09-10 NOTE — Brief Op Note (Signed)
09/10/2012  1:47 PM  PATIENT:  [name]  57 y.o. male  PRE-OPERATIVE DIAGNOSIS:  left hip retained, painful hardware and osteoarthritis left hip  POST-OPERATIVE DIAGNOSIS:  left hip retained, painful hardware and osteoarthritis left hip  PROCEDURE:  Procedure(s): REMOVAL OF HARDWARE LEFT PROXIMAL THIGH/FEMUR AND LEFT TOTAL HIP ARTHROPLASTY (Left) HARDWARE REMOVAL (Left)Removal of left proximal femur compression screw and side plate and 2 lag screws, total 10x4.5 screws and one lag screw. Left total hip replacement with 10 inch length 15mm AML. 4 x Vitallium Dalh-Persico Cables to distal-middle third femur. 58mm gryption metal cup with 2 fixation Screws. +5 neck 36mm ceramic head. 10 +4 poly liner.  SURGEON:  Surgeon(s) and Role: Kerrin Champagne, MD - Primary  PHYSICIAN ASSISTANT: Maud Deed, PA-C  ANESTHESIA:   general  EBL:  Total I/O In: 3100 [I.V.:2250; Blood:600; IV Piggyback:250] Out: 2190 [Urine:340; Blood:1850]  BLOOD ADMINISTERED:600 CC CELLSAVER  DRAINS: (One medium) Jackson-Pratt drain(s) with closed bulb suction in the left hip exiting the anterolateral thigh. and Urinary Catheter (Foley)   LOCAL MEDICATIONS USED:  NONE  SPECIMEN:  No Specimen  DISPOSITION OF SPECIMEN:  PATHOLOGY  COUNTS:  YES  TOURNIQUET:  * No tourniquets in log *  DICTATION: .Dragon Dictation  PLAN OF CARE: Admit to inpatient   PATIENT DISPOSITION:  PACU - hemodynamically stable.   Delay start of Pharmacological VTE agent (>24hrs) due to surgical blood loss or risk of bleeding: no

## 2012-09-10 NOTE — Anesthesia Procedure Notes (Signed)
Procedure Name: Intubation Date/Time: 09/10/2012 7:55 AM Performed by: De Nurse Pre-anesthesia Checklist: Patient identified, Emergency Drugs available, Suction available, Patient being monitored and Timeout performed Patient Re-evaluated:Patient Re-evaluated prior to inductionOxygen Delivery Method: Circle system utilized Preoxygenation: Pre-oxygenation with 100% oxygen Intubation Type: IV induction Ventilation: Mask ventilation without difficulty and Oral airway inserted - appropriate to patient size Laryngoscope Size: Mac and 3 Grade View: Grade I Tube type: Oral Tube size: 7.5 mm Number of attempts: 1 Airway Equipment and Method: Stylet Placement Confirmation: ETT inserted through vocal cords under direct vision,  positive ETCO2 and breath sounds checked- equal and bilateral Secured at: 23 cm Tube secured with: Tape Dental Injury: Teeth and Oropharynx as per pre-operative assessment

## 2012-09-10 NOTE — Anesthesia Postprocedure Evaluation (Signed)
  Anesthesia Post-op Note  Patient: [name]  Procedure(s) Performed: Procedure(s): REMOVAL OF HARDWARE LEFT PROXIMAL THIGH/FEMUR AND LEFT TOTAL HIP ARTHROPLASTY (Left) HARDWARE REMOVAL (Left)  Patient Location: PACU  Anesthesia Type:General  Level of Consciousness: sedated  Airway and Oxygen Therapy: Patient Spontanous Breathing and Patient connected to nasal cannula oxygen  Post-op Pain: none  Post-op Assessment: Post-op Vital signs reviewed  Post-op Vital Signs: Reviewed  Complications: No apparent anesthesia complications

## 2012-09-10 NOTE — Progress Notes (Signed)
Orthopedic Tech Progress Note Patient Details:  [name] 1955-04-12 [id number] [employer] called for brace order Patient ID: [name], male   DOB: 1955/02/26, 57 y.o.   MRN: [id number]   Orie Rout 09/10/2012, 3:12 PM

## 2012-09-10 NOTE — Anesthesia Preprocedure Evaluation (Addendum)
Anesthesia Evaluation  Patient identified by MRN, date of birth, ID band Patient awake    Reviewed: Allergy & Precautions, H&P , NPO status , Patient's Chart, lab work & pertinent test results  History of Anesthesia Complications Negative for: history of anesthetic complications  Airway Mallampati: II TM Distance: >3 FB Neck ROM: Full    Dental  (+) Missing, Poor Dentition, Dental Advisory Given and Chipped   Pulmonary shortness of breath and with exertion, Current Smoker,  breath sounds clear to auscultation  Pulmonary exam normal       Cardiovascular hypertension, Pt. on medications - dysrhythmias Rhythm:Regular Rate:Normal  6/14 Stress test: no ischemia, EF 67%   Neuro/Psych    GI/Hepatic Neg liver ROS, GERD-  Medicated and Controlled,  Endo/Other  diabetes (glu 136), Well Controlled, Type 2, Oral Hypoglycemic Agents and Insulin DependentMorbid obesity  Renal/GU      Musculoskeletal   Abdominal (+) + obese,   Peds  Hematology   Anesthesia Other Findings   Reproductive/Obstetrics                        Anesthesia Physical Anesthesia Plan  ASA: III  Anesthesia Plan:    Post-op Pain Management:    Induction: Intravenous  Airway Management Planned: Oral ETT  Additional Equipment:   Intra-op Plan:   Post-operative Plan: Extubation in OR  Informed Consent: I have reviewed the patients History and Physical, chart, labs and discussed the procedure including the risks, benefits and alternatives for the proposed anesthesia with the patient or authorized representative who has indicated his/her understanding and acceptance.   Dental advisory given  Plan Discussed with: CRNA and Surgeon  Anesthesia Plan Comments: (Plan routine monitors, GETA)        Anesthesia Quick Evaluation

## 2012-09-10 NOTE — Progress Notes (Signed)
Patient ID: [name], male   DOB: 1955/07/10, 57 y.o.   MRN: [id number] Awake alert and oriented x 4. Left leg is neurovascularly normal. Drain intact left hip hemovac. Foley to SD. Tolerating POs. PCA complains of the PCA locking out with its use. Normally he takes methadone 4 mg per day so he likely Has tolerance to the narcotic. Will restart his Methadone 4 mg after reviewing notes from his primary care MD. Ice to the left hip and left lateral thigh.

## 2012-09-10 NOTE — Op Note (Addendum)
09/10/2012  8:16 PM  PATIENT:  [name]  57 y.o. male  MRN: [id number]  OPERATIVE REPORT  PRE-OPERATIVE DIAGNOSIS:  left hip retained, painful hardware and osteoarthritis left hip  POST-OPERATIVE DIAGNOSIS:  left hip retained, painful hardware and osteoarthritis left hip  PROCEDURE:  Procedure(s): REMOVAL OF HARDWARE LEFT PROXIMAL THIGH/FEMUR AND LEFT TOTAL HIP ARTHROPLASTY HARDWARE REMOVAL removal of left proximal femur compression screw and sideplate, 12 screws, 11 4.5 screws and one lag screw. Left total hip arthroplasty utilizing a 10 inch 15 mm AML femoral stem with +2 neck and 32 mm ceramic head. 58 millimeter gryption metal cup with 2 fixation screws. 10+4 polyethylene liner. Fixation of posterior 1/3 greater trochanter fracture with 2 18 gauge tension band wires. Fixation of the left distal-middle 1/3 femoral shaft fracture with 4x Vitallium Dahl-Poppe Cables.    SURGEON:  Kerrin Champagne, MD     ASSISTANT:  Maud Deed, PA-C  (Present throughout the entire procedure and necessary for completion of procedure in a timely manner)     ANESTHESIA:  General, Dr. Jean Rosenthal M.D.    COMPLICATIONS:  Left distal middle one third femoral shaft fracture, split treated with multiple cerclage Dahl Stanard cables. Greater trochanter fracture posterior one third fixed with 2 18 gauge stainless steel wires.    COMPONENTS: 15mm 10 inch AML pore coated femoral stem. +3neck, 36mm ceramic head, 58mm gryption acetabular shell with 10 degree, +4 poly acetabular insert. 4 x vitallium Dahl-Thorley cables. 2 x 18 gauge wire to greater trochanter.  EBL: 1850cc  BLOOD: Cell saver 600cc.  PROCEDURE: The patient was met in the holding area, and the appropriate left hip identified and marked with an "X" and my initials.     The patient was then transported to OR .The patient was then placed under  general anesthesia without difficulty. The patient received appropriate preoperative antibiotic prophylaxis.  Nursing staff inserted a Foley catheter under sterile conditions. Patient was then transferred to the OR table and placed in a right lateral decubitus position left side up using a lateral stabilizing frame Innomed lateral positioner. All pressure points were padded on the down leg. A chest roll was used. The left leg was then prepped using sterile conditions and draped using sterile technique with DuraPrep including left foot.  Time-out procedure was called and correct . Incision made ellipsing the old incision scar along the lateral aspect of the proximal thigh and then curved posteriorly at the superior extent in a [place] approach fashion. Cell saver was used.Incision through skin and subcutaneous layers down to the tensor fascia lata this was then incised and spread. Incision then carried through the vastus lateralis over the lateral proximal femur and down to the greater trochanter laterally proximally. The compression screw and sideplate was immediately identified and the incision carried sharply down to the surface laterally. Cobb use to expose the anterior posterior aspect of the plate and at the expected area of forward to lag screws over the anterior aspect of the plate at about the fourth cortical screw site. Each of the screws were then individually loosened by hand. Osteotomes were necessary to remove bone that informed of the lateral and anterior posterior aspect of the plate. After the screws were each individually loosened a drill with hex head then used to carefully remove each of the screws without difficulty 2 proximal a partially-threaded cancellus screws were removed and then an additional 9 cortical screws. This included 2 lag screws along the anterior aspect of the plate  at about the fourth screw hole. Plate was then carefully freed from the lateral aspect of the femur using a Cobb elevator. Then a bone tamp used to carefully tapped along the proximal aspect of the plate removing the plate  off of the lag screw. The lag screw was then carefully engaged using an insertion instrument normally used for the [facility] system by [employer] and this engaged nicely the lag screw and the screw was able to be removed after first osteotomizing bone along the shank. With this removed then the hip was internally rotated exposure was performed on the posterior gluteus muscles of the hip could not be internally rotated more than about 10 maximum. In standard fashion and the greater trochanteric bursa was resected off of a greater trochanter and the short external rotators of the hip and the piriformis tendon identified. Piriformis tendon was then sutured and divided off of its attachment to the greater trochanter. This was then placed under traction and the posterior hip capsule identified. A Cobb elevator used to elevate the interval between the posterior hip capsule and short external rotators short external rotators were then sutured with the #1 Ethibond suture and divided off the posterior proximal aspect of the patient's femur. Closure was obtained distally along the posterior aspect proximal femur to level of the lesser trochanter. A Zickel band was divided. The hip capsule was then incised in T fashion along the inferior aspect of the gluteus minimus muscle. The edges of the capsule were tagged with 0 Ethibond sutures. The hip was then internally rotated and flexed and adducted dislocating the left hip. Using a cutting guide at about a fingerbreadth and a half above the lesser trochanter to cobras were placed about the femoral neck and the neck divided using an oscillating saw. The hip and was able to be internally rotated better at this point and the Meuller retractor and cobra retractors used to expose the femoral neck for machining of the proximal femoral canal. First a high-speed bur was used to carefully remove bone from the intramedullary portion of the femoral neck and proximal femur were free vascularized  fibular graft hardened bone material was present. After removing this material within the proximal femoral channel a T-handled canal finder was then passed. The first reamer for her AML was then able to be passed in line with the femur and this was done without difficulty. Using power reamer then reaming was continued up to a 10 mm reamer. At this point a guide pin and flexible reamers were used to ream the distal femoral shaft to 15 mm. With the 15 mm reamer the isthmus was passed and shattering of bone and not felt beyond this. No further reaming was carried out distally. The proximal femoral channel was then further reamed to15 millimeter. Broaching was then carried out with a 10 mm broaching was carried up to 15 mm. With the 15 mm broach in place then the leg was brought into extension the neck was found to be somewhat superior on the acetabulum. Leg was replaced in the internal rotation flexion adduction and the retraction instruments and cobra replaced. The trial 10 inch AML was then placed and this provided excellent fixation within the channel and about 15-20 of anteversion. It was impacted into place using a instrument through the holes in the proximal portion of the stem to control anteversion. With this then and again bringing the leg into extension the neck of the AML trial was found to be too much superior  to the acetabulum to allow for retraction and proximal femur anteriorly the acetabulum for purposes of reaming. The leg was brought into flexion and internal rotation and adduction. The trial stem was then removed and the broach and for15 millimeters replaced. This allowed the proximal femur and then to be retracted able to be retracted anterior to the acetabulum with a narrow Hohmann. A Cobra retractor was then placed in the inferior left posterior capsule for retraction. Reaming then carried out first using straight reamers then offset reamers to 57 mm for a 58 mm cup. Trial with a trial 56 mm cup  showed the cup to completely bottom out. So that the 58 mm trial was introduced impacted into place. Patient had large osteophytes anteriorly and inferiorly these were resected off the acetabulum using a half-inch osteotome and Leksell rongeur partial capsulectomy performed superiorly over the superior anterior aspect of the hip. Attempts were made first at a 56 mm cup this was unsuccessful in obtaining purchase using a cup without screws. Additional reaming performed and the 58 mm reduction cup was chosen after first trialing reduction using the broach within the femur and a +5 neck 36 mm head and +10 posterior insert for the acetabulum was used. The permanent 58 mm reduction cup allowing for screw fixation was then brought onto the field. This was then impacted in place 20 degrees of abduction and 30 of flexion. Insertion guide and insertion angle guide were used to place the acetabulum. The acetabulum was impacted into place and seated firmly. 2 fixation screws were placed one in the superior posterior screw hole and one into the superior screw hole these were each drilled appropriately and 20 mm screw used posteriorly and superiorly and a 35 mm screw superiorly. This provided fixation. Permanent liner was then placed packed into place after carefully remove the soft tissue from the acetabulum placing the hole eliminator centrally. This was then verified fixation using a Therapist, nutritional were well seated. Was then placed a trial reduction range of motion and her excellent position alignment and flexion well over 90 internal rotation approximately 70 no tendency to anterior dislocation with extension and external rotation. The knee can be flexed to 90 with the hip in extension. Hip was then dislocated and internally rotated. The leg adducted. Retractors placed about the proximal aspect of the femur and the permanent 15 mm 10 inch AML stem brought into place. Note that using the old holes in the proximal lateral  femur drill holes were placed transversely extending from the lag screw about the fragment posteriorly and through the most proximal cancellus screw hole about the superior aspect of this trochanteric fragment in order to perform tension band fixation of these 18-gauge wires were then twisted and returning to the proximal femur the permanent 10 inch 15 mm AML implant was then carefully impacted into place. This proceeded uneventfully. The hip then placed through a trial reduction using trial femoral neck and 36 mm head a trial with 5 mm was too long. The trial using a 3 mm head was appropriate length with no shucking no sign of instability. Hip was able to be root reduced and placed through range of motion and over 90 of flexion without subluxation. Internal rotation of 70 without subluxation. Full extension and external rotation without signs of anterior subluxation. With this then the permanent neck and head were brought onto the field +3 neck with 36 mm ceramic head was then impacted onto a cleaned Morris taper implant neck. It  was then reduced and again showed the aforementioned range of motion. C-arm fluoroscopy had been brought into the field earlier and used to evaluate the left femur with the trial 10 inch AML implant and showed no sign of fracture at that time. The C-arm was re\re brought into the field and used to examine the left femur and hip demonstrated good appearance to the left hip acetabulum and proximal femur extending up to the tip of the left AML implant wear the femur was noted to have a split in the distal middle third longitudinally. This did not appear to break through the anterior posterior cortex. The represented a split in the zone 2 of the left implant. That being the case further exposure was obtained of the left lateral distal thigh extending the incision distally an additional 8 inches through the skin and subcutaneous layers incising the tensor fascia and then the vastus lateralis  to the lateral aspect of the distal middle femoral shaft. The fracture site was identified. For individual Vitallium Dall-Huwe cables were then passed and these were then used to fix the fracture involving the distal middle third of the femoral shaft. No transverse component to the fracture displacement was noted. The fracture appeared stable with internal and external rotation the left femur. With this then the left hip capsule was then irrigated with copious amounts of irrigant solution posterior hip capsule was closed with interrupted #1 Ethibond sutures. The performance tendon reattached to the greater trochanter and the greater trochanter wires then tightened to their these were then carefully bent into the opening for the lag screw over the proximal lateral aspect of the femur. A single medium Hemovac drain was placed with extending to the anterior lateral aspect of the left proximal thigh. The tensor fascia then approximated over the greater trochanter with interrupted #1 Ethibond sutures the vastus lateralis was reapproximated with interrupted #1-0 Vicryl sutures proximal to distal. The tensor fascia was then approximated with interrupted #1 Vicryl sutures from the greater trochanter distally and over the proximal aspect of left tensor muscles. Subcutaneous layers were then approximated with interrupted 0 and 2-0 Vicryl sutures the skin closed with stainless steel staples Mepilex bandage was then applied. Knee immobilizer was then applied the patient returned to supine position he was then transferred to stretcher reactivated extubated and returned to the recovery room in satisfactory condition all instrument and sponge counts were correct. Patient's hardware removed was sterilized to be returned patient during his hospitalization.     Maud Deed PA-C perform the duties of assistant surgeon she performed careful retraction of soft tissues assisted in the removal of this patient's hardware, in the  manipulation of this patient's large  thigh was present from the beginning the case and performed closure of the incision from the deep muscle layers to the skin and application of dressing. She assisted in the transfer and positioning of the patient on the OR table and the removal off the table at the end of the case.       NITKA,JAMES E  09/10/2012, 8:16 PM

## 2012-09-10 NOTE — Progress Notes (Signed)
Pt states that Morphine is okay to take, it makes him slightly nauseated at times.

## 2012-09-11 ENCOUNTER — Encounter (HOSPITAL_COMMUNITY): Payer: Self-pay | Admitting: Specialist

## 2012-09-11 LAB — GLUCOSE, CAPILLARY
Glucose-Capillary: 197 mg/dL — ABNORMAL HIGH (ref 70–99)
Glucose-Capillary: 203 mg/dL — ABNORMAL HIGH (ref 70–99)
Glucose-Capillary: 198 mg/dL — ABNORMAL HIGH (ref 70–99)
Glucose-Capillary: 216 mg/dL — ABNORMAL HIGH (ref 70–99)

## 2012-09-11 LAB — BASIC METABOLIC PANEL
BUN: 15 mg/dL (ref 6–23)
CO2: 26 mEq/L (ref 19–32)
Calcium: 7.6 mg/dL — ABNORMAL LOW (ref 8.4–10.5)
Chloride: 98 mEq/L (ref 96–112)
Creatinine, Ser: 0.66 mg/dL (ref 0.50–1.35)
GFR calc Af Amer: >90 mL/min (ref >90)
GFR calc non Af Amer: >90 mL/min (ref >90)
Glucose, Bld: 175 mg/dL — ABNORMAL HIGH (ref 70–99)
Potassium: 4.3 mEq/L (ref 3.5–5.1)
Sodium: 133 mEq/L — ABNORMAL LOW (ref 135–145)

## 2012-09-11 LAB — HEMOGLOBIN A1C
Hgb A1c MFr Bld: 7.4 % — ABNORMAL HIGH (ref <5.7)
Mean Plasma Glucose: 166 mg/dL — ABNORMAL HIGH (ref <117)

## 2012-09-11 LAB — CBC
HCT: 36.6 % — ABNORMAL LOW (ref 39.0–52.0)
Hemoglobin: 12.4 g/dL — ABNORMAL LOW (ref 13.0–17.0)
MCH: 30.2 pg (ref 26.0–34.0)
MCHC: 33.9 g/dL (ref 30.0–36.0)
MCV: 89.1 fL (ref 78.0–100.0)
Platelets: 162 10*3/uL (ref 150–400)
RBC: 4.11 MIL/uL — ABNORMAL LOW (ref 4.22–5.81)
RDW: 14.2 % (ref 11.5–15.5)
WBC: 12.1 10*3/uL — ABNORMAL HIGH (ref 4.0–10.5)

## 2012-09-11 MED ORDER — INFLUENZA VAC SPLIT QUAD 0.5 ML IM SUSP
0.5000 mL | INTRAMUSCULAR | Status: AC
  Filled 2012-09-11 (×2): qty 0.5

## 2012-09-11 MED ORDER — PNEUMOCOCCAL VAC POLYVALENT 25 MCG/0.5ML IJ INJ
0.5000 mL | INJECTION | INTRAMUSCULAR | Status: AC
  Filled 2012-09-11: qty 0.5

## 2012-09-11 MED ORDER — HYDROCODONE-ACETAMINOPHEN 7.5-325 MG PO TABS
1.0000 | ORAL_TABLET | ORAL | Status: DC | PRN
  Administered 2012-09-11 – 2012-09-13 (×10): 2 via ORAL
  Filled 2012-09-11 (×10): qty 2

## 2012-09-11 MED ORDER — ATORVASTATIN CALCIUM 40 MG PO TABS
40.0000 mg | ORAL_TABLET | Freq: Every day | ORAL | Status: DC
  Administered 2012-09-11 – 2012-09-13 (×3): 40 mg via ORAL
  Filled 2012-09-11 (×4): qty 1

## 2012-09-11 MED FILL — Sodium Chloride IV Soln 0.9%: INTRAVENOUS | Qty: 1000 | Status: AC

## 2012-09-11 MED FILL — Sodium Chloride Irrigation Soln 0.9%: Qty: 3000 | Status: AC

## 2012-09-11 MED FILL — Heparin Sodium (Porcine) Inj 1000 Unit/ML: INTRAMUSCULAR | Qty: 30 | Status: AC

## 2012-09-11 NOTE — Progress Notes (Signed)
PT Cancellation Note  Patient Details Name: [name] MRN: [id number] DOB: [birth year]   Cancelled Treatment:    Reason Eval/Treat Not Completed: Other (comment) Still waiting for brace delivery from [employer]. Will continue to check back and evaluate when brace arrives.   Ruthann Cancer 09/11/2012, 10:30 AM  Ruthann Cancer, PT Acute Rehabilitation Services

## 2012-09-11 NOTE — Progress Notes (Signed)
Patient ID: [name], male   DOB: 1955/05/09, 57 y.o.   MRN: [id number] Subjective: 1 Day Post-Op Procedure(s) (LRB): REMOVAL OF HARDWARE LEFT PROXIMAL THIGH/FEMUR AND LEFT TOTAL HIP ARTHROPLASTY (Left) HARDWARE REMOVAL (Left) Awake, alert and oriented x 4. Methadone help decrease the amount of PCA use minimally.  Patient reports pain as marked.    Objective:   VITALS:  Temp:  [97.1 F (36.2 C)-98.4 F (36.9 C)] 98 F (36.7 C) (09/09 0620) Pulse Rate:  [88-100] 91 (09/09 0620) Resp:  [9-16] 9 (09/09 0800) BP: (114-160)/(61-93) 117/61 mmHg (09/09 0620) SpO2:  [97 %-100 %] 97 % (09/09 0800)  Neurologically intact ABD soft Neurovascular intact Sensation intact distally Dorsiflexion/Plantar flexion intact Incision: scant drainage Compartment soft   LABS  Recent Labs  09/11/12 0605  HGB 12.4*  WBC 12.1*  PLT 162    Recent Labs  09/11/12 0605  NA 133*  K 4.3  CL 98  CO2 26  BUN 15  CREATININE 0.66  GLUCOSE 175*   No results found for this basename: LABPT, INR,  in the last 72 hours   Assessment/Plan: 1 Day Post-Op Procedure(s) (LRB): REMOVAL OF HARDWARE LEFT PROXIMAL THIGH/FEMUR AND LEFT TOTAL HIP ARTHROPLASTY (Left) HARDWARE REMOVAL (Left)  Advance diet Up with therapy Continue pca today, slow progressive therapy. Partial weight bearing left leg 25-50% PT/OT when brace onto left leg.  Wean to po narcotics  NITKA,JAMES E 09/11/2012, 12:01 PM

## 2012-09-11 NOTE — Progress Notes (Signed)
09/11/12 Spoke with patient and his wife about HHC. They selected [facility] from [facility] agencies [employer] at [place] and set up [employer], OT and Charity fundraiser. No equipment needs identified. Jacquelynn Cree RN, BSN, CCM

## 2012-09-11 NOTE — Progress Notes (Signed)
PT Cancellation Note  Patient Details Name: [name] MRN: [id number] DOB: August 04, 1955   Cancelled Treatment:    Reason Eval/Treat Not Completed: Other (comment): Per nursing, brace delivered on 09/11/12, [employer] to return for adjustments due to improper fit. MD reports to nursing that pt is not safe for OOB activity at this time, however he can sit EOB without brace donned. PT to wait for brace adjustments and will see tomorrow.   Ruthann Cancer 09/11/2012, 3:01 PM  Ruthann Cancer, PT Acute Rehabilitation Services

## 2012-09-11 NOTE — Progress Notes (Signed)
Orthopedic Tech Progress Note Patient Details:  [name] 1955-01-23 [id number] Brace order completed by [employer] Patient ID: [name], male   DOB: [birth year], 57 y.o.   MRN: [id number]   [name] 09/11/2012, 5:46 PM

## 2012-09-11 NOTE — Progress Notes (Signed)
UR COMPLETED  

## 2012-09-11 NOTE — Progress Notes (Signed)
OT Cancellation Note  Patient Details Name: [name] MRN: [id number] DOB: 02-10-1955   Cancelled Treatment:    Reason Eval/Treat Not Completed: Other (comment). Continuing to await brace from [employer]. Will check back later today.  Margaretmary Eddy [facility] 09/11/2012, 9:39 AM

## 2012-09-11 NOTE — Progress Notes (Signed)
OT Cancellation Note  Patient Details Name: [name] MRN: [id number] DOB: July 14, 1955   Cancelled Treatment:    Reason Eval/Treat Not Completed: Other (comment) Still waiting for brace delivery from [employer]. Will continue to check back and evaluate when brace arrives today as time allows today, will check on tomorrow if brace not arriving today   Galen Manila 09/11/2012, 12:24 PM

## 2012-09-12 DIAGNOSIS — D62 Acute posthemorrhagic anemia: Secondary | ICD-10-CM | POA: Diagnosis not present

## 2012-09-12 LAB — CBC
HCT: 30.5 % — ABNORMAL LOW (ref 39.0–52.0)
Hemoglobin: 10.5 g/dL — ABNORMAL LOW (ref 13.0–17.0)
MCH: 30.3 pg (ref 26.0–34.0)
MCHC: 34.4 g/dL (ref 30.0–36.0)
MCV: 87.9 fL (ref 78.0–100.0)
Platelets: 156 10*3/uL (ref 150–400)
RBC: 3.47 MIL/uL — ABNORMAL LOW (ref 4.22–5.81)
RDW: 14.2 % (ref 11.5–15.5)
WBC: 12.5 10*3/uL — ABNORMAL HIGH (ref 4.0–10.5)

## 2012-09-12 LAB — GLUCOSE, CAPILLARY
Glucose-Capillary: 177 mg/dL — ABNORMAL HIGH (ref 70–99)
Glucose-Capillary: 201 mg/dL — ABNORMAL HIGH (ref 70–99)
Glucose-Capillary: 185 mg/dL — ABNORMAL HIGH (ref 70–99)
Glucose-Capillary: 177 mg/dL — ABNORMAL HIGH (ref 70–99)

## 2012-09-12 MED ORDER — SODIUM CHLORIDE 0.9 % IJ SOLN: 3.0000 mL | INTRAMUSCULAR | Status: DC | PRN

## 2012-09-12 MED ORDER — OXYCODONE HCL 5 MG PO TABS
5.0000 mg | ORAL_TABLET | ORAL | Status: DC | PRN
  Administered 2012-09-12 (×2): 10 mg via ORAL
  Administered 2012-09-12 – 2012-09-14 (×9): 15 mg via ORAL
  Filled 2012-09-12 (×8): qty 3
  Filled 2012-09-12: qty 2
  Filled 2012-09-12: qty 3
  Filled 2012-09-12: qty 2

## 2012-09-12 MED ORDER — MORPHINE SULFATE (PF) 1 MG/ML IV SOLN
INTRAVENOUS | Status: DC
  Administered 2012-09-12: 1 mg via INTRAVENOUS
  Filled 2012-09-12: qty 25

## 2012-09-12 MED ORDER — SODIUM CHLORIDE 0.9 % IJ SOLN
3.0000 mL | Freq: Two times a day (BID) | INTRAMUSCULAR | Status: DC
  Administered 2012-09-13 – 2012-09-14 (×3): 3 mL via INTRAVENOUS

## 2012-09-12 MED ORDER — MORPHINE SULFATE 2 MG/ML IJ SOLN
2.0000 mg | INTRAMUSCULAR | Status: DC | PRN
  Administered 2012-09-12 – 2012-09-14 (×6): 2 mg via INTRAVENOUS
  Filled 2012-09-12 (×6): qty 1

## 2012-09-12 NOTE — Evaluation (Signed)
Physical Therapy Evaluation Patient Details Name: [name] MRN: [id number] DOB: May 13, 1955 Today's Date: 09/12/2012 Time: 4098-1191 PT Time Calculation (min): 33 min  PT Assessment / Plan / Recommendation History of Present Illness  s/p LTKA, 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; With hip Abduction brace, which extends to ankle; Posterior prec, PWB 25-50%  Clinical Impression  Patient is s/p above surgery resulting in functional limitations due to the deficits listed below (see PT Problem List).  Patient will benefit from skilled PT to increase their independence and safety with mobility to allow discharge to the venue listed below.        PT Assessment  Patient needs continued PT services    Follow Up Recommendations  Home health PT;Supervision/Assistance - 24 hour    Does the patient have the potential to tolerate intense rehabilitation      Barriers to Discharge        Equipment Recommendations  Other (comment) (need clarification on exactly how much equipment pt has)    Recommendations for Other Services     Frequency 7X/week    Precautions / Restrictions Precautions Precautions: Posterior Hip Precaution Comments: Pt educated in post prec Required Braces or Orthoses: Other Brace/Splint Other Brace/Splint: Hip abd brace Restrictions LLE Weight Bearing: Partial weight bearing LLE Partial Weight Bearing Percentage or Pounds: 25-50%   Pertinent Vitals/Pain Extreme pain L hip; PCA used patient repositioned for comfort       Mobility  Bed Mobility Details for Bed Mobility Assistance: in recliner Transfers Transfers: Sit to Stand;Stand to Sit Sit to Stand: 1: +2 Total assist Sit to Stand: Patient Percentage: 80% Stand to Sit: 3: Mod assist Details for Transfer Assistance: Cues for post prec and technique, safety and hand placement; +2 for safety as pt was quite anxious and easily distracible, and of large body habitus; Overall good rise and use of UEs  to control descent Ambulation/Gait Ambulation/Gait Assistance: 1: +2 Total assist Ambulation/Gait: Patient Percentage: 80% Ambulation Distance (Feet): 12 Feet Assistive device: Rolling walker Ambulation/Gait Assistance Details: Cues for gait sequence, and for 25-50% PWB LLE Gait Pattern: Step-to pattern    Exercises     PT Diagnosis: Difficulty walking;Acute pain  PT Problem List: Decreased strength;Decreased range of motion;Decreased activity tolerance;Decreased balance;Decreased mobility;Decreased knowledge of use of DME;Decreased knowledge of precautions PT Treatment Interventions: DME instruction;Gait training;Functional mobility training;Therapeutic activities;Therapeutic exercise;Balance training;Patient/family education     PT Goals(Current goals can be found in the care plan section) Acute Rehab PT Goals Patient Stated Goal: heal and be done with surgery PT Goal Formulation: With patient Time For Goal Achievement: 09/26/12 Potential to Achieve Goals: Good  Visit Information  Last PT Received On: 09/12/12 Assistance Needed: +1 (+2 is helpful for lines) History of Present Illness: s/p LTKA, 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; With hip Abduction brace, which extends to ankle; Posterior prec, PWB 25-50%       Prior Functioning  Home Living Family/patient expects to be discharged to:: Private residence Living Arrangements: Spouse/significant other Available Help at Discharge: Family;Available 24 hours/day Type of Home: House Home Access: Ramped entrance Home Layout: One level Home Equipment: Walker - 2 wheels Prior Function Level of Independence: Independent with assistive device(s) Communication Communication: No difficulties Dominant Hand: Right    Cognition  Cognition Arousal/Alertness: Awake/alert Behavior During Therapy: WFL for tasks assessed/performed Overall Cognitive Status: Within Functional Limits for tasks assessed    Extremity/Trunk  Assessment Upper Extremity Assessment Upper Extremity Assessment: Overall WFL for tasks assessed (with some R  shoulder pain) Lower Extremity Assessment Lower Extremity Assessment: LLE deficits/detail LLE Deficits / Details: Painful hip postop; decr AROM and strength LLE: Unable to fully assess due to immobilization;Unable to fully assess due to pain   Balance    End of Session PT - End of Session Equipment Utilized During Treatment: Other (comment);Gait belt (hip abd brace) Activity Tolerance: Patient tolerated treatment well Patient left: in chair;with call bell/phone within reach;with family/visitor present Nurse Communication: Mobility status  GP    [place], [place] [phone]  Van Clines [facility] 09/12/2012, 2:07 PM

## 2012-09-12 NOTE — Progress Notes (Signed)
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; With hip Abduction brace, which extends to ankle; Posterior prec, PWB 25-50%   PT Comments   Good participation in second session, agreeable to getting OOB to chair for dinner; Noted L anterior  Chest pain, musculoskeletal in nature (?pectoralis strain/spasm?); applied ice  Progress will likely be steady, if slow, and pt will likley be able to dc home   Follow Up Recommendations  Home health PT;Supervision/Assistance - 24 hour     Does the patient have the potential to tolerate intense rehabilitation     Barriers to Discharge        Equipment Recommendations   (likely has equipment from other surgeries)    Recommendations for Other Services    Frequency 7X/week   Progress towards PT Goals Progress towards PT goals: Progressing toward goals  Plan Current plan remains appropriate    Precautions / Restrictions Precautions Precautions: Posterior Hip Precaution Comments: able to recall 3/3 Post Hip Prec Required Braces or Orthoses: Other Brace/Splint Other Brace/Splint: Hip abd brace Restrictions Weight Bearing Restrictions: Yes LLE Weight Bearing: Partial weight bearing LLE Partial Weight Bearing Percentage or Pounds: 25-50%   Pertinent Vitals/Pain 10+/10 Had received pain meds 30 minutes earlier    Mobility  Bed Mobility Bed Mobility: Supine to Sit Supine to Sit: 3: Mod assist;With rails Sit to Supine: 3: Mod assist Scooting to [facility]: With trapeze;4: Min assist Details for Bed Mobility Assistance: Heavy Mod assist with pt pulling up handheld assist with his Lhand while pulling up on trapeze bar with R hand; some difficulty transitioning R hand from trapeze to bed, but pt seems to  like trapeze bar anyway; Also required assist for LLE Transfers Transfers: Sit to Stand;Stand to Sit Sit to Stand: 3: Mod assist;From bed;From elevated surface Stand to Sit: 3: Mod assist;To chair/3-in-1;With armrests Details for Transfer Assistance: Cues for post prec and technique, safety and hand placement; +1 for safety as pt was quite anxious and easily distracible, and of large body habitus; Overall good rise and use of UEs to control descent Ambulation/Gait Ambulation/Gait Assistance: 4: Min assist (short distance) Ambulation Distance (Feet): 2 Feet Assistive device: Rolling walker Ambulation/Gait Assistance Details: small pivot steps bed to chair Gait Pattern: Step-to pattern    Exercises     PT Diagnosis:    PT Problem List:   PT Treatment Interventions:     PT Goals (current goals can now be found in the care plan section) Acute Rehab PT Goals Patient Stated Goal: heal and be done with surgery PT Goal Formulation: With patient Time For Goal Achievement: 09/26/12 Potential to Achieve Goals: Good  Visit Information  Last PT Received On: 09/12/12 Assistance Needed: +1 History of Present Illness: s/p LTKA, 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; With hip Abduction brace, which extends to ankle; Posterior prec, PWB 25-50%    Subjective Data  Subjective: Agreeable to getting OOB Patient Stated Goal: heal and be done with surgery   Cognition  Cognition Arousal/Alertness: Awake/alert Behavior During Therapy: WFL for tasks assessed/performed Overall Cognitive Status: Within Functional Limits for tasks assessed    Balance  Balance Balance Assessed: No  End of Session PT - End of Session Equipment Utilized During Treatment: Other (comment);Gait belt Activity Tolerance: Patient tolerated treatment well Patient left:  in chair;with call bell/phone within reach;with family/visitor present Nurse Communication: Mobility status   GP     Van Clines [facility] [place], [place] [phone]  09/12/2012, 5:12 PM

## 2012-09-12 NOTE — Evaluation (Signed)
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; With hip Abduction brace, which extends to ankle; Posterior prec, PWB 25-50%   Clinical Impression   Pt demos decline in function with ADLs and ADL mobility and would benefit form acute OT services to address impairments to help restore PLOF to return home    OT Assessment  Patient needs continued OT Services    Follow Up Recommendations  Home health OT;Supervision/Assistance - 24 hour    Barriers to Discharge   None  Equipment Recommendations  None recommended by OT    Recommendations for Other Services    Frequency  Min 2X/week    Precautions / Restrictions Precautions Precautions: Posterior Hip Precaution Comments: Pt educated in post prec Required Braces or Orthoses: Other Brace/Splint Other Brace/Splint: Hip abd brace Restrictions Weight Bearing Restrictions: Yes LLE Weight Bearing: Partial weight bearing LLE Partial Weight Bearing Percentage or Pounds: 25-50%   Pertinent Vitals/Pain 7/10    ADL  Grooming: Performed;Wash/dry face;Teeth care;Set up Where Assessed - Grooming: Supported sitting Upper Body Bathing: Minimal assistance Lower Body Bathing: +1 Total assistance Upper Body Dressing: Minimal assistance Lower Body Dressing: +1 Total assistance Toilet Transfer: Simulated;+1 Total assistance Toilet Transfer Method: Sit to stand Toileting - Clothing Manipulation and Hygiene: +1 Total assistance Where Assessed - Toileting Clothing Manipulation and Hygiene: Standing Transfers/Ambulation Related to ADLs: cues for safety ADL Comments: pt provided with review of ADL A/E, pt has at home    OT Diagnosis: Generalized weakness;Acute pain  OT Problem List: Impaired balance  (sitting and/or standing);Decreased activity tolerance;Decreased knowledge of use of DME or AE;Pain OT Treatment Interventions: Self-care/ADL training;DME and/or AE instruction;Therapeutic activities;Balance training;Therapeutic exercise;Neuromuscular education;Patient/family education   OT Goals(Current goals can be found in the care plan section) Acute Rehab OT Goals Patient Stated Goal: heal and be done with surgery OT Goal Formulation: With patient Time For Goal Achievement: 09/19/12 Potential to Achieve Goals: Good ADL Goals Pt Will Perform Grooming: with set-up;sitting Pt Will Perform Upper Body Bathing: with min guard assist;with set-up;with supervision;sitting Pt Will Perform Lower Body Bathing: with max assist;with mod assist;with caregiver independent in assisting;with adaptive equipment Pt Will Perform Upper Body Dressing: with min guard assist;with supervision;with set-up;sitting Pt Will Perform Lower Body Dressing: with max assist;with mod assist;with caregiver independent in assisting;with adaptive equipment Pt Will Transfer to Toilet: with max assist;bedside commode  Visit Information  Last OT Received On: 09/12/12 Assistance Needed: +1 History of Present Illness: s/p LTKA, 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; With hip Abduction brace, which extends to ankle; Posterior prec, PWB 25-50%       Prior Functioning     Home Living Family/patient expects to be discharged to:: Private residence Living Arrangements: Spouse/significant other Available Help at Discharge: Family;Available 24 hours/day Type of Home: House Home Access: Ramped entrance Home Layout: One level Home Equipment: Walker - 2 wheels Prior Function Level of Independence: Independent with assistive device(s) Communication Communication: No difficulties Dominant Hand: Right         Vision/Perception Vision - History Baseline Vision: Wears glasses only for reading Patient Visual Report: No  change from baseline Perception Perception: Within Functional Limits   Cognition  Cognition Arousal/Alertness: Awake/alert Behavior During Therapy: WFL for tasks assessed/performed Overall Cognitive Status: Within Functional Limits for tasks assessed    Extremity/Trunk  Assessment Upper Extremity Assessment Upper Extremity Assessment: Overall WFL for tasks assessed Lower Extremity Assessment     Mobility Bed Mobility Bed Mobility: Sit to Supine;Scooting to HOB Sit to Supine: 3: Mod assist Scooting to [facility]: With trapeze;4: Min assist Details for Bed Mobility Assistance: Mod A with LEs Transfers Transfers: Sit to Stand;Stand to Sit Sit to Stand: 1: +1 Total assist;From chair/3-in-1 Sit to Stand: Patient Percentage: 80% Stand to Sit: To bed;1: +1 Total assist Details for Transfer Assistance: Cues for post prec and technique, safety and hand placement; +1 for safety as pt was quite anxious and easily distracible, and of large body habitus; Overall good rise and use of UEs to control descent     Exercise     Balance Balance Balance Assessed: No   End of Session OT - End of Session Equipment Utilized During Treatment: Gait belt;Left knee immobilizer;Back brace Activity Tolerance: Patient limited by fatigue;Patient limited by pain Patient left: in bed;with call bell/phone within reach  GO     Galen Manila 09/12/2012, 2:59 PM

## 2012-09-12 NOTE — Progress Notes (Signed)
Orthopedic Tech Progress Note Patient Details:  [name] 04/04/1955 [id number]  Patient ID: [name], male   DOB: 09-04-1955, 57 y.o.   MRN: [id number]   [name] 09/12/2012, 11:00 AMTrapeze bar.

## 2012-09-12 NOTE — Progress Notes (Signed)
Patient ID: [name], male   DOB: 03-03-1955, 57 y.o.   MRN: [id number] Tolerated PT.  Now at rest.  Took Oxy IR earlier and tolerated well.  Will DC PCA now and use OXY IR.  Morphine IV push only for severe breakthru pain.

## 2012-09-12 NOTE — Progress Notes (Signed)
Subjective: 2 Days Post-Op Procedure(s) (LRB): REMOVAL OF HARDWARE LEFT PROXIMAL THIGH/FEMUR AND LEFT TOTAL HIP ARTHROPLASTY (Left) HARDWARE REMOVAL (Left) Patient reports pain as severe.  Currently controlled with morphine PCA and vicodin.  Discussed transition to po oxycodone.  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  Will use PCA today for PT and start OxyIR 5-15 q3-4 hrs prn pain later today Having flatus.  No nausea.  Eating well.  Not having diabetic diet but order was changed this am by RN. Brace on and pt out of bed to recliner this am with wife only.  Long discussion regarding PT and his goals.  Will anticipate discharge to home if he is able to accomplish bed to chair and ambulation to bathroom independently. WB precautions of 25-50%  Discussed with PT  Objective: Vital signs in last 24 hours: Temp:  [98.4 F (36.9 C)-99.5 F (37.5 C)] 98.4 F (36.9 C) (09/10 0625) Pulse Rate:  [95-106] 96 (09/10 0625) Resp:  [11-24] 24 (09/10 0806) BP: (136-140)/(67-72) 136/67 mmHg (09/10 0625) SpO2:  [94 %-100 %] 94 % (09/10 0806) Weight:  [122.9 kg (270 lb 15.1 oz)] 122.9 kg (270 lb 15.1 oz) (09/09 1341)  Intake/Output from previous day: 09/09 0701 - 09/10 0700 In: 2655 [P.O.:1320; I.V.:1320] Out: 3050 [Urine:3050] Intake/Output this shift: Total I/O In: 312.1 [P.O.:240; I.V.:72.1] Out: -    Recent Labs  09/11/12 0605 09/12/12 0515  HGB 12.4* 10.5*    Recent Labs  09/11/12 0605 09/12/12 0515  WBC 12.1* 12.5*  RBC 4.11* 3.47*  HCT 36.6* 30.5*  PLT 162 156    Recent Labs  09/11/12 0605  NA 133*  K 4.3  CL 98  CO2 26  BUN 15  CREATININE 0.66  GLUCOSE 175*  CALCIUM 7.6*   No results found for this basename: LABPT, INR,  in the last 72 hours  Neurovascular intact Sensation intact distally Intact pulses distally Dorsiflexion/Plantar flexion intact Incision: no drainage.  hemovac removed without difficulty.  Assessment/Plan: 2  Days Post-Op Procedure(s) (LRB): REMOVAL OF HARDWARE LEFT PROXIMAL THIGH/FEMUR AND LEFT TOTAL HIP ARTHROPLASTY (Left) HARDWARE REMOVAL (Left) Up with therapy Discharge home with home health [facility] and trapeze to bed. Dressing change  Wean from PCA to po oxy IR  [name] 09/12/2012, 8:48 AM

## 2012-09-12 NOTE — Progress Notes (Signed)
Patient examined and lab reviewed with Vernon PA-C. 

## 2012-09-13 ENCOUNTER — Encounter (HOSPITAL_COMMUNITY): Payer: Self-pay | Admitting: Emergency Medicine

## 2012-09-13 DIAGNOSIS — M9702XA Periprosthetic fracture around internal prosthetic left hip joint, initial encounter: Secondary | ICD-10-CM

## 2012-09-13 DIAGNOSIS — M1612 Unilateral primary osteoarthritis, left hip: Secondary | ICD-10-CM | POA: Diagnosis present

## 2012-09-13 LAB — GLUCOSE, CAPILLARY
Glucose-Capillary: 207 mg/dL — ABNORMAL HIGH (ref 70–99)
Glucose-Capillary: 176 mg/dL — ABNORMAL HIGH (ref 70–99)
Glucose-Capillary: 241 mg/dL — ABNORMAL HIGH (ref 70–99)
Glucose-Capillary: 180 mg/dL — ABNORMAL HIGH (ref 70–99)
Glucose-Capillary: 199 mg/dL — ABNORMAL HIGH (ref 70–99)

## 2012-09-13 LAB — CBC
HCT: 29.4 % — ABNORMAL LOW (ref 39.0–52.0)
Hemoglobin: 10.1 g/dL — ABNORMAL LOW (ref 13.0–17.0)
MCH: 30.3 pg (ref 26.0–34.0)
MCHC: 34.4 g/dL (ref 30.0–36.0)
MCV: 88.3 fL (ref 78.0–100.0)
Platelets: 166 10*3/uL (ref 150–400)
RBC: 3.33 MIL/uL — ABNORMAL LOW (ref 4.22–5.81)
RDW: 14 % (ref 11.5–15.5)
WBC: 9.8 10*3/uL (ref 4.0–10.5)

## 2012-09-13 MED ORDER — PIOGLITAZONE HCL 45 MG PO TABS
45.0000 mg | ORAL_TABLET | Freq: Every day | ORAL | Status: DC
  Administered 2012-09-14: 45 mg via ORAL
  Filled 2012-09-13: qty 1

## 2012-09-13 MED ORDER — INFLUENZA VAC SPLIT QUAD 0.5 ML IM SUSP
0.5000 mL | INTRAMUSCULAR | Status: AC
  Administered 2012-09-13: 0.5 mL via INTRAMUSCULAR
  Filled 2012-09-13: qty 0.5

## 2012-09-13 MED ORDER — METFORMIN HCL 500 MG PO TABS
1000.0000 mg | ORAL_TABLET | Freq: Two times a day (BID) | ORAL | Status: DC
  Administered 2012-09-14: 1000 mg via ORAL
  Filled 2012-09-13 (×3): qty 2

## 2012-09-13 MED ORDER — GLIMEPIRIDE 4 MG PO TABS
4.0000 mg | ORAL_TABLET | Freq: Every day | ORAL | Status: DC
  Administered 2012-09-14: 4 mg via ORAL
  Filled 2012-09-13 (×2): qty 1

## 2012-09-13 MED ORDER — INFLUENZA VAC SPLIT QUAD 0.5 ML IM SUSP: 0.5000 mL | INTRAMUSCULAR | Status: DC

## 2012-09-13 MED ORDER — PNEUMOCOCCAL VAC POLYVALENT 25 MCG/0.5ML IJ INJ
0.5000 mL | INJECTION | Freq: Once | INTRAMUSCULAR | Status: AC
  Administered 2012-09-13: 0.5 mL via INTRAMUSCULAR
  Filled 2012-09-13: qty 0.5

## 2012-09-13 NOTE — Progress Notes (Addendum)
Patient ID: [name], male   DOB: [birth year], 57 y.o.   MRN: [id number] Subjective: 3 Days Post-Op Procedure(s) (LRB): REMOVAL OF HARDWARE LEFT PROXIMAL THIGH/FEMUR AND LEFT TOTAL HIP ARTHROPLASTY (Left) HARDWARE REMOVAL (Left) awake, alert and oriented x 4. Has been taking oral meds regularly. Complains of right pectoralis muscle discomfort with weight bearing through the right UE. Patient reports pain as moderate.    Objective:   VITALS:  Temp:  [98.6 F (37 C)-101.2 F (38.4 C)] 98.9 F (37.2 C) (09/11 0602) Pulse Rate:  [89-106] 89 (09/11 0602) Resp:  [10-24] 20 (09/11 0602) BP: (145-155)/(65-67) 151/66 mmHg (09/11 0602) SpO2:  [94 %-100 %] 100 % (09/11 0602)  Neurologically intact ABD soft Neurovascular intact Sensation intact distally Intact pulses distally Dorsiflexion/Plantar flexion intact Incision: moderate drainage No cellulitis present Compartment soft No chest wall pain, negative for pain with inspiration, pain with Right arm forward press and internal rotation consistent with pectoralis muscle strain.  LABS  Recent Labs  09/11/12 0605 09/12/12 0515 09/13/12 0505  HGB 12.4* 10.5* 10.1*  WBC 12.1* 12.5* 9.8  PLT 162 156 166    Recent Labs  09/11/12 0605  NA 133*  K 4.3  CL 98  CO2 26  BUN 15  CREATININE 0.66  GLUCOSE 175*   No results found for this basename: LABPT, INR,  in the last 72 hours   Assessment/Plan: 3 Days Post-Op Procedure(s) (LRB): REMOVAL OF HARDWARE LEFT PROXIMAL THIGH/FEMUR AND LEFT TOTAL HIP ARTHROPLASTY (Left) HARDWARE REMOVAL (Left) Diabetes Type 2, adult onset Perioperative acute blood loss anemia stabilizing.  Advance diet Up with therapy D/C IV fluids Discharge home with home health likely tomorrow or Sat.  Shanyla Marconi E 09/13/2012, 6:36 AM

## 2012-09-13 NOTE — Progress Notes (Signed)
Orthopedic Tech Progress Note Patient Details:  COPE MARTE [birth year] [id number]  Ortho Devices Type of Ortho Device: Rib belt Ortho Device/Splint Interventions: Application   Cammer, Mickie Bail 09/13/2012, 9:19 AM

## 2012-09-13 NOTE — Progress Notes (Signed)
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 Biggest hurdle will be bed mobility however patient motivated and does have assistance from his wife and states he does sleep in recliner some at home. Patient has ramp to enter his house  Follow Up Recommendations  Home health PT;Supervision/Assistance - 24 hour     Does the patient have the potential to tolerate intense rehabilitation     Barriers to Discharge        Equipment Recommendations       Recommendations for Other Services    Frequency 7X/week   Progress towards PT Goals Progress towards PT goals: Progressing toward goals  Plan Current plan remains appropriate    Precautions / Restrictions Precautions Precautions: Posterior Hip Precaution Comments: able to recall 3/3 Post Hip Prec Required Braces or Orthoses: Other Brace/Splint Other Brace/Splint: Hip abd brace Restrictions LLE Weight Bearing: Partial weight bearing LLE Partial Weight Bearing Percentage or Pounds: 50   Pertinent Vitals/Pain no apparent distress    Mobility  Bed Mobility Bed Mobility: Not assessed Supine to Sit: 5: Supervision;With rails Sit to Supine: 4: Min assist Details for Bed Mobility Assistance: Paitent with increased time and reliance on trapeze bar and rails. Will have assistance from wife at home. A for LEs back into bed Transfers Sit to Stand: 5: Supervision;From bed;From chair/3-in-1 Stand to Sit: 5: Supervision;To chair/3-in-1;To bed Details for Transfer Assistance: Paitent with good technique and correct hand placement and  safety Ambulation/Gait Ambulation/Gait Assistance: 5: Supervision Ambulation Distance (Feet): 100 Feet Assistive device: Rolling walker Ambulation/Gait Assistance Details: Cues for posture. 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; With hip Abduction brace, which extends to ankle; Posterior prec, PWB 25-50%    Subjective Data      Cognition  Cognition Arousal/Alertness: Awake/alert Behavior During Therapy: WFL for tasks assessed/performed Overall Cognitive Status: Within Functional Limits for tasks assessed    Balance     End of Session PT - End of Session Equipment Utilized During Treatment: Other (comment);Gait belt Activity Tolerance: Patient tolerated treatment well Patient left: with call bell/phone within reach;with family/visitor present;in bed Nurse Communication: Mobility status   GP     Fredrich Birks 09/13/2012, 2:33 PM 09/13/2012 Fredrich Birks PTA [phone] pager [phone] office

## 2012-09-13 NOTE — Progress Notes (Signed)
Patient progress note reviewed with Dondra Spry.

## 2012-09-13 NOTE — Progress Notes (Signed)
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 Patient stated that he got up at 2am this morning and walk to the bathroom. Patient hoping to be able to DC home tomorrow. Will progress ambulation this afternoon.   Follow Up Recommendations  Home health PT;Supervision/Assistance - 24 hour     Does the patient have the potential to tolerate intense rehabilitation     Barriers to Discharge        Equipment Recommendations       Recommendations for Other Services    Frequency 7X/week   Progress towards PT Goals Progress towards PT goals: Progressing toward goals  Plan Current plan remains appropriate    Precautions / Restrictions Precautions Precautions: Posterior Hip Precaution Comments: able to recall 3/3 Post Hip Prec Required Braces or Orthoses: Other Brace/Splint Other Brace/Splint: Hip abd brace Restrictions LLE Weight Bearing: Partial weight bearing LLE Partial Weight Bearing Percentage or Pounds: 50   Pertinent Vitals/Pain 8/10 L hip pain. patient repositioned for comfort    Mobility  Bed Mobility Bed Mobility: Not assessed Transfers Sit to Stand: 4: Min guard;From chair/3-in-1;With armrests;With upper extremity assist Stand to Sit: To chair/3-in-1;With armrests;4: Min guard;With upper extremity assist Details for Transfer Assistance: Paitent with good technique and correct hand placement and safety Ambulation/Gait Ambulation/Gait Assistance: 4: Min guard Ambulation Distance (Feet): 80 Feet Assistive device: Rolling walker Ambulation/Gait Assistance Details: Patient with  good steppage. Cues for upright posture. Able to maintain 50% PWB. 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; With hip Abduction brace, which extends to ankle; Posterior prec, PWB 25-50%    Subjective Data      Cognition  Cognition Arousal/Alertness: Awake/alert Behavior During Therapy: WFL for tasks assessed/performed Overall Cognitive Status: Within Functional Limits for tasks assessed    Balance     End of Session PT - End of Session Equipment Utilized During Treatment: Other (comment);Gait belt Activity Tolerance: Patient tolerated treatment well Patient left: in chair;with call bell/phone within reach;with family/visitor present Nurse Communication: Mobility status   GP     Fredrich Birks 09/13/2012, 12:00 PM 09/13/2012 Fredrich Birks PTA [phone] pager [phone] office

## 2012-09-13 NOTE — Progress Notes (Signed)
Occupational Therapy Treatment Patient Details Name: [name] MRN: [id number] DOB: 23-Oct-1955 Today's Date: 09/13/2012 Time: 4098-1191 OT Time Calculation (min): 17 min  OT Assessment / Plan / Recommendation  History of present illness s/p LTKA, 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; With hip Abduction brace, which extends to ankle; Posterior prec, PWB 25-50%   OT comments  Pt progressing well and demos decreased c/o pain and decreased assist today for ADL/functional mobility. Pt to continue with acute OT services to restore PLOF  Follow Up Recommendations  Home health OT;Supervision/Assistance - 24 hour    Barriers to Discharge   None    Equipment Recommendations  None recommended by OT    Recommendations for Other Services    Frequency Min 2X/week   Progress towards OT Goals Progress towards OT goals: Progressing toward goals  Plan Discharge plan remains appropriate    Precautions / Restrictions Precautions Precautions: Posterior Hip Precaution Comments: able to recall 3/3 Post Hip Prec Required Braces or Orthoses: Other Brace/Splint Other Brace/Splint: Hip abd brace Restrictions LLE Weight Bearing: Partial weight bearing LLE Partial Weight Bearing Percentage or Pounds: 50%   Pertinent Vitals/Pain 3/10    ADL  Grooming: Performed;Wash/dry hands;Wash/dry face;Min guard Where Assessed - Grooming: Supported standing Toilet Transfer: Performed;Min Agricultural engineer Method: Sit to Barista: Raised toilet seat with arms (or 3-in-1 over toilet);Grab bars Toileting - Clothing Manipulation and Hygiene: Performed;Min guard Where Assessed - Engineer, mining and Hygiene: Standing    OT Diagnosis:    OT Problem List:   OT Treatment Interventions:     OT Goals(current goals can now be found in the care plan section)    Visit Information  Assistance Needed: +1 History of Present Illness: s/p LTKA, with  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; With hip Abduction brace, which extends to ankle; Posterior prec, PWB 25-50%    Subjective Data      Prior Functioning       Cognition  Cognition Arousal/Alertness: Awake/alert Behavior During Therapy: WFL for tasks assessed/performed Overall Cognitive Status: Within Functional Limits for tasks assessed    Mobility  Bed Mobility Bed Mobility: Not assessed Supine to Sit: 5: Supervision;With rails Sit to Supine: 4: Min assist Details for Bed Mobility Assistance: Paitent with increased time and reliance on trapeze bar and rails. Will have assistance from wife at home. A for LEs back into bed Transfers Transfers: Sit to Stand;Stand to Sit Sit to Stand: 5: Supervision;From bed;From chair/3-in-1 Stand to Sit: 5: Supervision;To chair/3-in-1;To bed Details for Transfer Assistance: Paitent with good technique and correct hand placement and safety    Exercises      Balance Balance Balance Assessed: Yes Dynamic Standing Balance Dynamic Standing - Balance Support: No upper extremity supported;Left upper extremity supported;During functional activity Dynamic Standing - Level of Assistance: 5: Stand by assistance   End of Session OT - End of Session Equipment Utilized During Treatment: Gait belt;Rolling walker;Back brace Activity Tolerance: Patient tolerated treatment well Patient left: Other (comment) (ambulating in hallway with PT)  GO     Galen Manila 09/13/2012, 3:38 PM

## 2012-09-14 LAB — GLUCOSE, CAPILLARY
Glucose-Capillary: 216 mg/dL — ABNORMAL HIGH (ref 70–99)
Glucose-Capillary: 232 mg/dL — ABNORMAL HIGH (ref 70–99)

## 2012-09-14 MED ORDER — OXYCODONE HCL 5 MG PO TABS: 5.0000 mg | ORAL_TABLET | ORAL | Status: DC | PRN

## 2012-09-14 MED ORDER — RIVAROXABAN 10 MG PO TABS: 10.0000 mg | ORAL_TABLET | Freq: Every day | ORAL | Status: DC

## 2012-09-14 MED ORDER — FERROUS SULFATE 325 (65 FE) MG PO TABS: 325.0000 mg | ORAL_TABLET | Freq: Three times a day (TID) | ORAL | Status: DC

## 2012-09-14 MED ORDER — HYDROCODONE-ACETAMINOPHEN 7.5-325 MG PO TABS: 1.0000 | ORAL_TABLET | ORAL | Status: DC | PRN

## 2012-09-14 MED ORDER — DSS 100 MG PO CAPS: 100.0000 mg | ORAL_CAPSULE | Freq: Two times a day (BID) | ORAL | Status: DC

## 2012-09-14 MED ORDER — BISACODYL 10 MG RE SUPP: 10.0000 mg | Freq: Every day | RECTAL | Status: DC | PRN

## 2012-09-14 NOTE — Progress Notes (Signed)
Physical Therapy Treatment Patient Details Name: [name] MRN: [id number] DOB: [birth year] Today's Date: 09/14/2012 Time: 1478-2956 PT Time Calculation (min): 15 min  PT Assessment / Plan / Recommendation  History of Present Illness s/p L THA, 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; With hip Abduction brace, which extends to ankle; Posterior prec, PWB 25-50%   PT Comments   Pt unhappy this morning and reluctant to participate with therapy. States he is leaving today. Equipment needs are still unclear, however pt states he has had multiple surgeries (including multiple hip surgeries) in the past and states he has "everything he needs" at home. Pt does not want therapists assistance at all during session and at one point asks therapist to step away from him. Only wants wife's help during transfers. Overall pt is safe and able to perform functional mobility with minimal assistance or supervision with the RW.   Follow Up Recommendations  Home health PT;Supervision/Assistance - 24 hour     Does the patient have the potential to tolerate intense rehabilitation     Barriers to Discharge        Equipment Recommendations  Other (comment) (Need clarification on exactly how much equipment pt has)    Recommendations for Other Services    Frequency 7X/week   Progress towards PT Goals Progress towards PT goals: Progressing toward goals  Plan Current plan remains appropriate    Precautions / Restrictions Precautions Precautions: Posterior Hip Precaution Comments: able to recall 3/3 Post Hip Prec Required Braces or Orthoses: Other Brace/Splint Other Brace/Splint: Hip abd brace Restrictions Weight Bearing Restrictions: Yes LLE Weight Bearing: Partial weight bearing LLE Partial Weight Bearing Percentage or Pounds: 50%   Pertinent Vitals/Pain Pt reports pain "all over", however does not rate pain on 0-10 scale when asked. Pt also has compression strap donned around chest for pain relief  of what pt reports is a strained muscle.    Mobility  Bed Mobility Bed Mobility: Supine to Sit;Sitting - Scoot to Edge of Bed Supine to Sit: 4: Min assist;With rails Sitting - Scoot to [place] of Bed: 5: Supervision Details for Bed Mobility Assistance: Pt was able to perform bed mobility and transfer to EOB with min assist from wife for trunk elevation to come to full sit. Transfers Transfers: Sit to Stand;Stand to Sit Sit to Stand: 5: Supervision;From bed;From chair/3-in-1 Stand to Sit: 5: Supervision;To chair/3-in-1;To bed Details for Transfer Assistance: Pt demonstrated proper hand placement on seated surface prior to initiating transfers, and safety awareness with the RW. Ambulation/Gait Ambulation/Gait Assistance: 5: Supervision Ambulation Distance (Feet): 100 Feet Assistive device: Rolling walker Ambulation/Gait Assistance Details: Pt with gait pattern adequate for d/c. Pt was safe and demonstrated good balance, however pt was cued for improved posture with walker. Gait Pattern: Step-to pattern Gait velocity: decreased    Exercises     PT Diagnosis:    PT Problem List:   PT Treatment Interventions:     PT Goals (current goals can now be found in the care plan section) Acute Rehab PT Goals Patient Stated Goal: heal and be done with surgery PT Goal Formulation: With patient Time For Goal Achievement: 09/26/12 Potential to Achieve Goals: Good  Visit Information  Last PT Received On: 09/14/12 Assistance Needed: +1 History of Present Illness: s/p L THA, 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; With hip Abduction brace, which extends to ankle; Posterior prec, PWB 25-50%    Subjective Data  Subjective: Pt required some motivation to participate with therapy this morning. He states  he is doing fine with his wife's help and does not need to continue "proving himself" to everyone that comes in. Patient Stated Goal: heal and be done with surgery   Cognition   Cognition Arousal/Alertness: Awake/alert Behavior During Therapy: WFL for tasks assessed/performed Overall Cognitive Status: Within Functional Limits for tasks assessed    Balance  Balance Balance Assessed: Yes Static Standing Balance Static Standing - Balance Support: No upper extremity supported Static Standing - Level of Assistance: 5: Stand by assistance Static Standing - Comment/# of Minutes: 30 seconds  End of Session PT - End of Session Activity Tolerance: Patient tolerated treatment well Patient left: in chair;with call bell/phone within reach;with family/visitor present   GP     Ruthann Cancer 09/14/2012, 9:13 AM  Ruthann Cancer, PT Acute Rehabilitation Services

## 2012-09-14 NOTE — Progress Notes (Addendum)
Patient d/c to home, IV removed and prescriptions given and d/c instructions reviewed.  8mL of PCA morphine wasted in sink witnessed by Mauro Kaufmann RN.  Was unable to find to waste in [place].

## 2012-09-14 NOTE — Progress Notes (Signed)
Pt reports he does not have further OT needs. No further treatment from OT, will sign off. Ignacia Palma, OTR/L [phone] 09/14/2012

## 2012-09-14 NOTE — Progress Notes (Signed)
Patient ID: [name], male   DOB: 05-Jun-1955, 57 y.o.   MRN: [id number] Subjective: 4 Days Post-Op Procedure(s) (LRB): REMOVAL OF HARDWARE LEFT PROXIMAL THIGH/FEMUR AND LEFT TOTAL HIP ARTHROPLASTY (Left) HARDWARE REMOVAL (Left)Awake, alert and oriented x 4. Pain is controlled with hydrocodone and methocarbamol. No BM  Positive flatus. Doing well in PT and OT.   Patient reports pain as mild.    Objective:   VITALS:  Temp:  [98.4 F (36.9 C)-99.1 F (37.3 C)] 99.1 F (37.3 C) (09/12 0556) Pulse Rate:  [68-100] 94 (09/12 0556) Resp:  [16-18] 18 (09/12 0556) BP: (136-151)/(59-76) 151/59 mmHg (09/12 0556) SpO2:  [94 %-98 %] 98 % (09/12 0556)  Neurologically intact ABD soft Neurovascular intact Sensation intact distally Dorsiflexion/Plantar flexion intact Incision: moderate drainage No cellulitis present Compartment soft Dressing changed moderate amounts of clear serous drainage. No purulence or induration or erythrema.  LABS  Recent Labs  09/12/12 0515 09/13/12 0505  HGB 10.5* 10.1*  WBC 12.5* 9.8  PLT 156 166   No results found for this basename: NA, K, CL, CO2, BUN, CREATININE, GLUCOSE,  in the last 72 hours No results found for this basename: LABPT, INR,  in the last 72 hours   Assessment/Plan: 4 Days Post-Op Procedure(s) (LRB): REMOVAL OF HARDWARE LEFT PROXIMAL THIGH/FEMUR AND LEFT TOTAL HIP ARTHROPLASTY (Left) HARDWARE REMOVAL (Left) Acute blood loss anemia Diabetes Mellitus Type 2 Periprosthetic fracture.  Advance diet Up with therapy D/C IV fluids Discharge home with home health  [name] 09/14/2012, 8:40 AM

## 2012-09-17 NOTE — Discharge Summary (Signed)
Physician Discharge Summary  Patient ID: [name] MRN: [id number] DOB/AGE: 07-Feb-1955 57 y.o.  Admit date: 09/10/2012 Discharge date: 09/14/2012  Admission Diagnoses:  Osteoarthritis of left hip  Discharge Diagnoses:  Principal Problem:   Osteoarthritis of left hip Active Problems:   DIABETES MELLITUS, TYPE II   Postoperative anemia due to acute blood loss   Periprosthetic fracture around internal prosthetic left hip joint   Past Medical History  Diagnosis Date  . History of hip replacement, total   . Diabetes mellitus   . Hypertension   . Hyperlipidemia   . GERD (gastroesophageal reflux disease)   . Obesity   . Chronic pain   . History of colonic polyps   . RBBB (right bundle branch block with left anterior fascicular block)   . 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 Left anterior hemiblock   . Adenomatous polyp     Surgeries: Procedure(s): REMOVAL OF HARDWARE LEFT PROXIMAL THIGH/FEMUR AND LEFT TOTAL HIP ARTHROPLASTY HARDWARE REMOVAL on 09/10/2012  ORIF of periprosthetic fracture with cables.  Consultants (if any):  none  Discharged Condition: Improved  Hospital Course: [name] is an 57 y.o. male who was admitted 09/10/2012 with a diagnosis of Osteoarthritis of left hip and went to the operating room on 09/10/2012 and underwent the above named procedures.    He was given perioperative antibiotics:  Anti-infectives   Start     Dose/Rate Route Frequency Ordered Stop   09/10/12 1200  ceFAZolin (ANCEF) IVPB 2 g/50 mL premix  Status:  Discontinued     2 g 100 mL/hr over 30 Minutes Intravenous To Surgery 09/10/12 1146 09/10/12 1610   09/10/12 0745  vancomycin (VANCOCIN) IVPB 1000 mg/200 mL premix  Status:  Discontinued     1,000 mg 200 mL/hr over 60 Minutes Intravenous  Once 09/10/12 0736 09/10/12 1610   09/10/12 0600  ceFAZolin (ANCEF) 3 g in dextrose 5 % 50 mL IVPB     3 g 160 mL/hr over 30 Minutes Intravenous On call to O.R. 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  Required PCA meds an additional day for pain control and weaned to po OXYIR for pain control.   PT/OT assisted with ambulation using a walker.  Pt allowed 25-50% partial weight bearing for ambulation.  He tolerated the limitations well using a walker.   Dressing change done and wound healing well.   Ice used to decrease edema. Blood sugars stable and pt eating well prior to discharge. Arrangements made for HHPT and pt discharge in stable condition.  He was given sequential compression devices, early ambulation, and xarelto for DVT prophylaxis.  He benefited maximally from the hospital stay and there were no complications.    Recent vital signs:  Filed Vitals:   09/14/12 0556  BP: 151/59  Pulse: 94  Temp: 99.1 F (37.3 C)  Resp: 18    Recent laboratory studies:  Lab Results  Component Value Date   HGB 10.1* 09/13/2012   HGB 10.5* 09/12/2012   HGB 12.4* 09/11/2012   Lab Results  Component Value Date   WBC 9.8 09/13/2012   PLT 166 09/13/2012   Lab Results  Component Value Date   INR 0.97 09/06/2012   Lab Results  Component Value Date   NA 133* 09/11/2012   K 4.3 09/11/2012   CL 98 09/11/2012   CO2 26 09/11/2012   BUN 15 09/11/2012   CREATININE 0.66 09/11/2012  GLUCOSE 175* 09/11/2012    Discharge Medications:     Medication List    STOP taking these medications       aspirin 81 MG tablet      TAKE these medications       atorvastatin 40 MG tablet  Commonly known as:  LIPITOR  Take 1 tablet (40 mg total) by mouth daily.     bisacodyl 10 MG suppository  Commonly known as:  DULCOLAX  Place 1 suppository (10 mg total) rectally daily as needed for constipation.     DSS 100 MG Caps  Take 100 mg by mouth 2 (two) times daily.     ferrous sulfate 325 (65 FE) MG tablet  Take 1 tablet (325 mg total) by mouth 3 (three) times daily after meals.     glimepiride 4 MG tablet  Commonly known as:  AMARYL  Take 1 tablet (4 mg total) by  mouth daily as needed.     glucose blood test strip  1 each by Other route daily as needed for other. Use as instructed     HYDROcodone-acetaminophen 7.5-325 MG per tablet  Commonly known as:  NORCO  Take 1-2 tablets by mouth every 4 (four) hours as needed for pain (maximum of 8 total pain medication and tylenol tablets per day.).     Insulin Pen Needle 32G X 5 MM Misc  Commonly known as:  NOVOTWIST  1 each by Does not apply route as directed.     lidocaine 5 %  Commonly known as:  LIDODERM  Place 1 patch onto the skin as needed.     Liraglutide 18 MG/3ML Soln injection  Commonly known as:  VICTOZA  Inject 0.6mg  daily for 1 week then 1.2 mg daily for 1 week then 1.8 mg daily as directed     lisinopril-hydrochlorothiazide 20-25 MG per tablet  Commonly known as:  PRINZIDE,ZESTORETIC  Take 1 tablet by mouth every morning.     metFORMIN 1000 MG tablet  Commonly known as:  GLUCOPHAGE  Take 1 tablet (1,000 mg total) by mouth 2 (two) times daily with a meal.     methadone 10 MG tablet  Commonly known as:  DOLOPHINE  Take 10 mg by mouth every morning. 1-4 by mouth daily     methocarbamol 750 MG tablet  Commonly known as:  ROBAXIN  Take 1 tablet (750 mg total) by mouth every 6 (six) hours as needed.     multivitamin with minerals tablet  Take 1 tablet by mouth daily.     omeprazole 20 MG capsule  Commonly known as:  PRILOSEC  Take 1 capsule (20 mg total) by mouth daily.     ondansetron 4 MG disintegrating tablet  Commonly known as:  ZOFRAN-ODT  Take 1 tablet (4 mg total) by mouth every 8 (eight) hours as needed.     oxyCODONE 5 MG immediate release tablet  Commonly known as:  Oxy IR/ROXICODONE  Take 1-3 tablets (5-15 mg total) by mouth every 4 (four) hours as needed for pain (if hydrocodone is not effective use one every 4 hours.).     oxyCODONE-acetaminophen 5-325 MG per tablet  Commonly known as:  PERCOCET/ROXICET  Take 1 tablet by mouth as needed.     pioglitazone 45  MG tablet  Commonly known as:  ACTOS  Take 1 tablet (45 mg total) by mouth daily.     rivaroxaban 10 MG Tabs tablet  Commonly known as:  XARELTO  Take 1 tablet (10 mg  total) by mouth daily with breakfast.     vardenafil 20 MG tablet  Commonly known as:  LEVITRA  Take 20 mg by mouth as needed.     VOLTAREN 1 % Gel  Generic drug:  diclofenac sodium  Apply topically.        Diagnostic Studies: Dg Chest 2 View  09/06/2012   *RADIOLOGY REPORT*  Clinical Data: Preoperative evaluation for left total hip arthroplasty, history hypertension, diabetes, smoking  CHEST - 2 VIEW  Comparison: 12/23/2009  Findings: Upper-normal size of cardiac silhouette. Mediastinal contours and pulmonary vascularity normal. Mild chronic peribronchial thickening and accentuation perihilar markings likely reflecting chronic bronchitis. No acute infiltrate, pleural effusion or pneumothorax. Multilevel endplate spur formation thoracic spine.  IMPRESSION: Mild chronic bronchitic changes.   Original Report Authenticated By: Ulyses Southward, M.D.   Dg Femur Left  09/10/2012   *RADIOLOGY REPORT*  Clinical Data: Left total hip hardware removal  DG C-ARM GT 120 MIN,LEFT FEMUR - 2 VIEW  Comparison:  Pelvis exam 12/29/2009  Findings: The first image demonstrates a left femoral intramedullary stem. Additional images demonstrate a left total hip replacement with cerclage wires at the greater trochanter.  There is lucency involving the distal femur which most likely represents a nondisplaced fracture.  The final images demonstrate cerclage wires along the distal aspect of the femoral stem.  IMPRESSION: Left hip hardware replacement as described.  Lucency along the distal left femur is most likely related to a periprosthetic fracture.   Original Report Authenticated By: Richarda Overlie, M.D.   Dg Pelvis Portable  09/10/2012   CLINICAL DATA:  57 year old male status post left femur ORIF.  EXAM: PORTABLE PELVIS  COMPARISON:  Intraoperative fluoroscopic  images from 10/20 6 hours the same day and earlier.  FINDINGS: Portable AP view at 1451 hrs. Sequelae of left total hip arthroplasty. Previously existing right total hip arthroplasty. Visible left hip hardware appears stable from the earlier intraoperative images. Postoperative changes to the overlying soft tissues. Pelvis remains intact.  IMPRESSION: Interval left hip total arthroplasty. Stable visualized hardware. No adverse features identified.   Electronically Signed   By: Augusto Gamble M.D.   On: 09/10/2012 15:11   Dg Femur Left Port  09/10/2012   *RADIOLOGY REPORT*  Clinical Data: Distal femoral shaft fracture.  PORTABLE LEFT FEMUR - 2 VIEW  Comparison: Intraoperative images dated and 09/10/2012  Findings: There is a fracture of the distal femoral shaft below the tip of the long stem prosthesis.  Four cerclage wires have been placed around the fracture in the mid to distal femoral shaft. Left total hip prosthesis and left total knee prosthesis are noted. There is no angulation or displacement of the fracture fragments.  Cerclage wire around the left greater trochanter.  IMPRESSION: Open reduction and internal fixation of left femoral shaft fracture.   Original Report Authenticated By: Francene Boyers, M.D.   Dg C-arm Gt 120 Min  09/10/2012   *RADIOLOGY REPORT*  Clinical Data: Left total hip hardware removal  DG C-ARM GT 120 MIN,LEFT FEMUR - 2 VIEW  Comparison:  Pelvis exam 12/29/2009  Findings: The first image demonstrates a left femoral intramedullary stem. Additional images demonstrate a left total hip replacement with cerclage wires at the greater trochanter.  There is lucency involving the distal femur which most likely represents a nondisplaced fracture.  The final images demonstrate cerclage wires along the distal aspect of the femoral stem.  IMPRESSION: Left hip hardware replacement as described.  Lucency along the distal left femur is  most likely related to a periprosthetic fracture.   Original Report  Authenticated By: Richarda Overlie, M.D.    Disposition: 06-Home-Health Care Svc      Discharge Orders   Future Appointments Provider Department Dept Phone   12/03/2012 10:00 AM Gordy Savers, MD [facility] [place] [phone]   Future Orders Complete By Expires   Call MD / Call 911  As directed    Comments:     If you experience chest pain or shortness of breath, CALL 911 and be transported to the hospital emergency room.  If you develope a fever above 101 F, pus (white drainage) or increased drainage or redness at the wound, or calf pain, call your surgeon's office.   Change dressing  As directed    Comments:     You may change your dressing on 9/13 and then change the dressing every other day with sterile ABD and 1 inch gauze  paper tape.  You may clean the incision with alcohol prior to betadeine.   Constipation Prevention  As directed    Comments:     Drink plenty of fluids.  Prune juice may be helpful.  You may use a stool softener, such as Colace (over the counter) 100 mg twice a day.  Use MiraLax (over the counter) for constipation as needed.   Diet Carb Modified  As directed    Discharge instructions  As directed    Comments:     Keep hip incision dry for 5 days post op then may wet while bathing. Therapy daily . Call if fever or chills or increased drainage. Go to ER if acutely short of breath or call for ambulance. Return for follow up in 2 weeks. May partial weight bear 25-50% on the surgical leg unless told otherwise. In house walking for first 2 weeks.   Discharge wound care:  As directed    Comments:     If you have a hip bandage, keep it clean and dry.  Change your bandage as instructed by your health care providers.  If your bandage has been discontinued, keep your incision clean and dry.  Pat dry after bathing.  DO NOT put lotion or powder on your incision.   Do not sit on low chairs, stoools or toilet seats, as it may be difficult to get up from low  surfaces  As directed    Driving restrictions  As directed    Comments:     No driving for 6 weeks   Follow the hip precautions as taught in Physical Therapy  As directed    Increase activity slowly as tolerated  As directed       Follow-up Information   Follow up with NITKA,JAMES E, MD In 1 week.   Specialty:  Orthopedic Surgery   Contact information:   [address] [place] [address] [phone]        Signed: Wende Neighbors 09/17/2012, 10:49 AM

## 2012-09-19 NOTE — Discharge Summary (Signed)
Patient d/c summary note and lab reviewed.  

## 2012-09-20 ENCOUNTER — Other Ambulatory Visit: Payer: Self-pay | Admitting: Internal Medicine

## 2012-09-26 NOTE — Progress Notes (Signed)
Late Entry: SW received a consult for possible placement. PT  At this time is recommending home with HH and not SNF.  Clinical Social Worker will sign off for now as social work intervention is no longer needed. Please consult us again if new need arises.   Enslee Bibbins, MSW, LCSWA [phone] 

## 2012-10-15 ENCOUNTER — Telehealth: Payer: Self-pay | Admitting: Internal Medicine

## 2012-10-15 NOTE — Telephone Encounter (Signed)
Pt request refill methadone (DOLOPHINE) 10 MG tablet 4 X /day

## 2012-10-16 MED ORDER — METHADONE HCL 10 MG PO TABS: 10.0000 mg | ORAL_TABLET | Freq: Every morning | ORAL | Status: DC

## 2012-10-16 NOTE — Telephone Encounter (Signed)
Spoke to pt told him Rx ready for pick up will be at front desk. Pt verbalized understanding.

## 2012-10-16 NOTE — Telephone Encounter (Signed)
Rx printed and signed put at front desk for pickup.

## 2012-11-01 ENCOUNTER — Other Ambulatory Visit (HOSPITAL_COMMUNITY): Payer: Self-pay | Admitting: Urology

## 2012-11-01 DIAGNOSIS — K4020 Bilateral inguinal hernia, without obstruction or gangrene, not specified as recurrent: Secondary | ICD-10-CM

## 2012-11-01 DIAGNOSIS — N5089 Other specified disorders of the male genital organs: Secondary | ICD-10-CM

## 2012-11-02 ENCOUNTER — Ambulatory Visit (HOSPITAL_COMMUNITY)
Admission: RE | Admit: 2012-11-02 | Discharge: 2012-11-02 | Disposition: A | Discharge status: Home / Self Care | Payer: 59 | Source: Ambulatory Visit | Attending: Urology | Admitting: Urology

## 2012-11-02 DIAGNOSIS — N433 Hydrocele, unspecified: Secondary | ICD-10-CM | POA: Insufficient documentation

## 2012-11-02 DIAGNOSIS — N508 Other specified disorders of male genital organs: Secondary | ICD-10-CM | POA: Insufficient documentation

## 2012-11-02 DIAGNOSIS — N5089 Other specified disorders of the male genital organs: Secondary | ICD-10-CM

## 2012-11-02 DIAGNOSIS — K4020 Bilateral inguinal hernia, without obstruction or gangrene, not specified as recurrent: Secondary | ICD-10-CM

## 2012-11-08 ENCOUNTER — Ambulatory Visit (INDEPENDENT_AMBULATORY_CARE_PROVIDER_SITE_OTHER): Payer: 59 | Admitting: General Surgery

## 2012-11-08 ENCOUNTER — Encounter (INDEPENDENT_AMBULATORY_CARE_PROVIDER_SITE_OTHER): Payer: Self-pay | Admitting: General Surgery

## 2012-11-08 ENCOUNTER — Other Ambulatory Visit (INDEPENDENT_AMBULATORY_CARE_PROVIDER_SITE_OTHER): Payer: Self-pay | Admitting: General Surgery

## 2012-11-08 VITALS — BP 122/68 | HR 88 | Resp 16 | Ht 71.5 in | Wt 283.0 lb

## 2012-11-08 DIAGNOSIS — K469 Unspecified abdominal hernia without obstruction or gangrene: Secondary | ICD-10-CM

## 2012-11-08 DIAGNOSIS — K4020 Bilateral inguinal hernia, without obstruction or gangrene, not specified as recurrent: Secondary | ICD-10-CM

## 2012-11-08 DIAGNOSIS — K429 Umbilical hernia without obstruction or gangrene: Secondary | ICD-10-CM | POA: Diagnosis not present

## 2012-11-08 LAB — BUN: BUN: 14 mg/dL (ref 6–23)

## 2012-11-08 LAB — CREATININE, SERUM: Creat: 0.77 mg/dL (ref 0.50–1.35)

## 2012-11-08 NOTE — Progress Notes (Signed)
Subjective:     Patient ID: [name], male   DOB: [birth year], 57 y.o.   MRN: [id number]  HPI The patient is a 57 year old male who is referred by Dr. Margarita Grizzle for evaluation of bilateral inguinal hernias and umbilical hernia. The patient has been undergoing workup for a left testicular mass. He underwent an ultrasound recently which revealed no testicular mass, however there was some fat likely associated with this hernia. This states she has more pain to his left side the right side of the scrotum. He does state he has a difficult time with urination.  Review of Systems  Constitutional: Negative.   HENT: Negative.   Respiratory: Negative.   Cardiovascular: Negative.   Gastrointestinal: Negative.   Genitourinary: Negative.   Neurological: Negative.   All other systems reviewed and are negative.       Objective:   Physical Exam  Constitutional: He is oriented to person, place, and time. He appears well-developed and well-nourished.  HENT:  Head: Normocephalic and atraumatic.  Eyes: Conjunctivae and EOM are normal. Pupils are equal, round, and reactive to light.  Neck: Normal range of motion. Neck supple.  Cardiovascular: Regular rhythm and normal heart sounds.   Pulmonary/Chest: Effort normal and breath sounds normal.  Abdominal: Soft. Bowel sounds are normal. A hernia is present. Hernia confirmed positive in the ventral area, confirmed positive in the right inguinal area and confirmed positive in the left inguinal area.    Musculoskeletal: Normal range of motion.  Neurological: He is alert and oriented to person, place, and time.       Assessment:     57 year old male with large bilateral inguinal hernias and umbilical hernia, all reducible     Plan:     1. We will obtain a CT scan to evaluate and define his anatomy looking for any type of sliding component to his hernia. 2. The patient would like to wait to recover from his recent left hip surgery prior to having his  inguinal hernia surgery. 2. Will call the patient back with results of his CT scan and discuss any surgery in the future.

## 2012-11-14 ENCOUNTER — Ambulatory Visit
Admission: RE | Admit: 2012-11-14 | Discharge: 2012-11-14 | Disposition: A | Discharge status: Home / Self Care | Payer: 59 | Source: Ambulatory Visit | Attending: General Surgery | Admitting: General Surgery

## 2012-11-14 MED ORDER — IOHEXOL 300 MG/ML  SOLN
125.0000 mL | Freq: Once | INTRAMUSCULAR | Status: AC | PRN
  Administered 2012-11-14: 125 mL via INTRAVENOUS

## 2012-11-15 ENCOUNTER — Telehealth (INDEPENDENT_AMBULATORY_CARE_PROVIDER_SITE_OTHER): Payer: Self-pay | Admitting: General Surgery

## 2012-11-15 NOTE — Telephone Encounter (Signed)
I discussed with the patient the results of the CT scan revealed a fat-containing bilateral inguinal hernias. The patient states that she would like to wait for one to 2 months prior to fixing her schedule a hernia repair to help get over his orthopedic left hip surgery. I think this is okay from his hernia standpoint. Patient will call back and schedule an appointment for surgical consultation.

## 2012-12-03 ENCOUNTER — Ambulatory Visit (INDEPENDENT_AMBULATORY_CARE_PROVIDER_SITE_OTHER): Payer: 59 | Admitting: Internal Medicine

## 2012-12-03 ENCOUNTER — Encounter: Payer: Self-pay | Admitting: Internal Medicine

## 2012-12-03 VITALS — BP 118/70 | HR 84 | Temp 97.8°F | Resp 20 | Wt 281.0 lb

## 2012-12-03 DIAGNOSIS — M169 Osteoarthritis of hip, unspecified: Secondary | ICD-10-CM | POA: Diagnosis not present

## 2012-12-03 DIAGNOSIS — E785 Hyperlipidemia, unspecified: Secondary | ICD-10-CM

## 2012-12-03 DIAGNOSIS — M1612 Unilateral primary osteoarthritis, left hip: Secondary | ICD-10-CM

## 2012-12-03 DIAGNOSIS — I10 Essential (primary) hypertension: Secondary | ICD-10-CM

## 2012-12-03 DIAGNOSIS — E119 Type 2 diabetes mellitus without complications: Secondary | ICD-10-CM

## 2012-12-03 DIAGNOSIS — E669 Obesity, unspecified: Secondary | ICD-10-CM

## 2012-12-03 DIAGNOSIS — M1610 Unilateral primary osteoarthritis, unspecified hip: Secondary | ICD-10-CM

## 2012-12-03 MED ORDER — METHADONE HCL 10 MG PO TABS: 10.0000 mg | ORAL_TABLET | Freq: Every morning | ORAL | Status: DC

## 2012-12-03 NOTE — Progress Notes (Signed)
Subjective:    Patient ID: [name], male    DOB: 1955/03/13, 57 y.o.   MRN: [id number]  HPI  Pre-visit discussion using our clinic review tool. No additional management support is needed unless otherwise documented below in the visit note.   48 patient who is seen today for followup.  Since his last visit here he is a left hip surgery complicated by a periprosthetic fracture. Presently doing much better except for a slowly resolving URI. He has obesity hypertension dyslipidemia. He has osteoarthritis and chronic low back pain. Remains on methadone one tablet daily  Fasting blood sugars are improving since his activity level has increased. His last hemoglobin A1c 7.4.  Past Medical History  Diagnosis Date  . History of hip replacement, total   . Diabetes mellitus   . Hypertension   . Hyperlipidemia   . GERD (gastroesophageal reflux disease)   . Obesity   . Chronic pain   . History of colonic polyps   . RBBB (right bundle branch block with left anterior fascicular block)   . 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 Left anterior hemiblock   . Adenomatous polyp     History   Social History  . Marital Status: Married    Spouse Name: N/A    Number of Children: N/A  . Years of Education: N/A   Occupational History  . Not on file.   Social History Main Topics  . Smoking status: Former Smoker    Quit date: 01/04/2007  . Smokeless tobacco: Not on file  . Alcohol Use: No     Comment: recovering alcoholic  . Drug Use: Not on file  . Sexual Activity: Not on file   Other Topics Concern  . Not on file   Social History Narrative   Recovering alcoholic      Minister     Past Surgical History  Procedure Laterality Date  . Hip surgery  12/29/09    R THR  (Nitka)  . Appendectomy    . Foot surgery    . Cervical laminectomy    . Cervical fusion    . Hip surgery      bilateral  . Total knee arthroplasty  01/2008    left knee  . Total hip  arthroplasty Left 09/10/2012    Procedure: REMOVAL OF HARDWARE LEFT PROXIMAL THIGH/FEMUR AND LEFT TOTAL HIP ARTHROPLASTY;  Surgeon: Kerrin Champagne, MD;  Location: [facility] OR;  Service: Orthopedics;  Laterality: Left;  . Hardware removal Left 09/10/2012    Procedure: HARDWARE REMOVAL;  Surgeon: Kerrin Champagne, MD;  Location: [facility] OR;  Service: Orthopedics;  Laterality: Left;    Family History  Problem Relation Age of Onset  . Cancer Mother     lung  . Crohn's disease Father   . Alcohol abuse Brother     Allergies  Allergen Reactions  . Aspirin     REACTION: unspecified  . Codeine     REACTION: itch  . Morphine And Related Nausea And Vomiting  . Naproxen     REACTION: anaphylaxis    Current Outpatient Prescriptions on File Prior to Visit  Medication Sig Dispense Refill  . atorvastatin (LIPITOR) 40 MG tablet Take 1 tablet (40 mg total) by mouth daily.  90 tablet  3  . diclofenac sodium (VOLTAREN) 1 % GEL Apply topically.        . docusate sodium 100 MG CAPS Take 100 mg by mouth 2 (  two) times daily.  100 capsule  0  . glimepiride (AMARYL) 4 MG tablet Take 1 tablet (4 mg total) by mouth daily as needed.  90 tablet  3  . glucose blood test strip 1 each by Other route daily as needed for other. Use as instructed  100 each  12  . HYDROcodone-acetaminophen (NORCO) 7.5-325 MG per tablet Take 1-2 tablets by mouth every 4 (four) hours as needed for pain (maximum of 8 total pain medication and tylenol tablets per day.).  60 tablet  1  . Insulin Pen Needle (NOVOTWIST) 32G X 5 MM MISC 1 each by Does not apply route as directed.  100 each  3  . lidocaine (LIDODERM) 5 % Place 1 patch onto the skin as needed.       . Liraglutide (VICTOZA) 18 MG/3ML SOLN Inject 0.6mg  daily for 1 week then 1.2 mg daily for 1 week then 1.8 mg daily as directed  6 mL  6  . lisinopril-hydrochlorothiazide (PRINZIDE,ZESTORETIC) 20-25 MG per tablet Take 1 tablet by mouth every morning.      . metFORMIN (GLUCOPHAGE) 1000 MG tablet Take  1 tablet (1,000 mg total) by mouth 2 (two) times daily with a meal.  180 tablet  3  . methadone (DOLOPHINE) 10 MG tablet Take 1 tablet (10 mg total) by mouth every morning. 1-4 by mouth daily  90 tablet  0  . methocarbamol (ROBAXIN) 750 MG tablet Take 1 tablet (750 mg total) by mouth every 6 (six) hours as needed.  90 tablet  4  . Multiple Vitamins-Minerals (MULTIVITAMIN WITH MINERALS) tablet Take 1 tablet by mouth daily.      Marland Kitchen omeprazole (PRILOSEC) 20 MG capsule Take 1 capsule (20 mg total) by mouth daily.  90 capsule  4  . ondansetron (ZOFRAN-ODT) 4 MG disintegrating tablet Take 1 tablet (4 mg total) by mouth every 8 (eight) hours as needed.  20 tablet  1  . pioglitazone (ACTOS) 45 MG tablet Take 1 tablet (45 mg total) by mouth daily.  90 tablet  3  . vardenafil (LEVITRA) 20 MG tablet Take 20 mg by mouth as needed.       No current facility-administered medications on file prior to visit.    BP 118/70  Pulse 84  Temp(Src) 97.8 F (36.6 C) (Oral)  Resp 20  Wt 281 lb (127.461 kg)  SpO2 98%     Review of Systems  Constitutional: Negative for fever, chills, appetite change and fatigue.  HENT: Negative for congestion, dental problem, ear pain, hearing loss, sore throat, tinnitus, trouble swallowing and voice change.   Eyes: Negative for pain, discharge and visual disturbance.  Respiratory: Negative for cough, chest tightness, wheezing and stridor.   Cardiovascular: Negative for chest pain, palpitations and leg swelling.  Gastrointestinal: Negative for nausea, vomiting, abdominal pain, diarrhea, constipation, blood in stool and abdominal distention.  Genitourinary: Negative for urgency, hematuria, flank pain, discharge, difficulty urinating and genital sores.  Musculoskeletal: Positive for arthralgias and back pain. Negative for gait problem, joint swelling, myalgias and neck stiffness.  Skin: Negative for rash.  Neurological: Negative for dizziness, syncope, speech difficulty,  weakness, numbness and headaches.  Hematological: Negative for adenopathy. Does not bruise/bleed easily.  Psychiatric/Behavioral: Negative for behavioral problems and dysphoric mood. The patient is not nervous/anxious.        Objective:   Physical Exam  Constitutional: He is oriented to person, place, and time. He appears well-developed.  Weight 281 Blood pressure normal  HENT:  Head:  Normocephalic.  Right Ear: External ear normal.  Left Ear: External ear normal.  Eyes: Conjunctivae and EOM are normal.  Neck: Normal range of motion.  Cardiovascular: Normal rate and normal heart sounds.   Pulmonary/Chest: Breath sounds normal.  Abdominal: Bowel sounds are normal.  Musculoskeletal: Normal range of motion. He exhibits no edema and no tenderness.  Neurological: He is alert and oriented to person, place, and time.  Psychiatric: He has a normal mood and affect. His behavior is normal.          Assessment & Plan:   Diabetes mellitus. We'll defer a hemoglobin A1c today and check in 3 months. Exercise weight loss all encouraged Hypertension well controlled Morbid obesity Dyslipidemia Osteoarthritis. Methadone prescription refilled  Recheck 3 months Eye examination encouraged

## 2012-12-03 NOTE — Progress Notes (Signed)
Pre-visit discussion using our clinic review tool. No additional management support is needed unless otherwise documented below in the visit note.  

## 2012-12-03 NOTE — Patient Instructions (Signed)
Limit your sodium (Salt) intake  You need to lose weight.  Consider a lower calorie diet and regular exercise.    It is important that you exercise regularly, at least 20 minutes 3 to 4 times per week.  If you develop chest pain or shortness of breath seek  medical attention.  Please see your eye doctor yearly to check for diabetic eye damage   Please check your hemoglobin A1c every 3 months  

## 2013-01-01 ENCOUNTER — Other Ambulatory Visit: Payer: Self-pay | Admitting: Internal Medicine

## 2013-01-09 DIAGNOSIS — M48061 Spinal stenosis, lumbar region without neurogenic claudication: Secondary | ICD-10-CM | POA: Diagnosis not present

## 2013-01-09 DIAGNOSIS — IMO0002 Reserved for concepts with insufficient information to code with codable children: Secondary | ICD-10-CM | POA: Diagnosis not present

## 2013-01-10 ENCOUNTER — Other Ambulatory Visit: Payer: Self-pay | Admitting: Specialist

## 2013-01-10 DIAGNOSIS — M5410 Radiculopathy, site unspecified: Secondary | ICD-10-CM

## 2013-01-13 ENCOUNTER — Other Ambulatory Visit: Payer: 59

## 2013-01-19 ENCOUNTER — Ambulatory Visit
Admission: RE | Admit: 2013-01-19 | Discharge: 2013-01-19 | Disposition: A | Discharge status: Home / Self Care | Payer: 59 | Source: Ambulatory Visit | Attending: Specialist | Admitting: Specialist

## 2013-01-19 DIAGNOSIS — M5410 Radiculopathy, site unspecified: Secondary | ICD-10-CM

## 2013-01-30 DIAGNOSIS — M48061 Spinal stenosis, lumbar region without neurogenic claudication: Secondary | ICD-10-CM | POA: Diagnosis not present

## 2013-02-06 DIAGNOSIS — IMO0002 Reserved for concepts with insufficient information to code with codable children: Secondary | ICD-10-CM | POA: Diagnosis not present

## 2013-02-06 DIAGNOSIS — M48061 Spinal stenosis, lumbar region without neurogenic claudication: Secondary | ICD-10-CM | POA: Diagnosis not present

## 2013-03-04 ENCOUNTER — Ambulatory Visit: Payer: 59 | Admitting: Internal Medicine

## 2013-03-13 DIAGNOSIS — M25569 Pain in unspecified knee: Secondary | ICD-10-CM | POA: Diagnosis not present

## 2013-03-13 DIAGNOSIS — M67919 Unspecified disorder of synovium and tendon, unspecified shoulder: Secondary | ICD-10-CM | POA: Diagnosis not present

## 2013-03-13 DIAGNOSIS — M719 Bursopathy, unspecified: Secondary | ICD-10-CM | POA: Diagnosis not present

## 2013-03-21 ENCOUNTER — Encounter: Payer: Self-pay | Admitting: Internal Medicine

## 2013-03-21 ENCOUNTER — Encounter: Payer: Self-pay | Admitting: *Deleted

## 2013-03-21 ENCOUNTER — Ambulatory Visit (INDEPENDENT_AMBULATORY_CARE_PROVIDER_SITE_OTHER): Payer: 59 | Admitting: Internal Medicine

## 2013-03-21 VITALS — BP 150/80 | HR 88 | Temp 98.1°F | Resp 20 | Ht 71.5 in | Wt 291.0 lb

## 2013-03-21 DIAGNOSIS — E785 Hyperlipidemia, unspecified: Secondary | ICD-10-CM

## 2013-03-21 DIAGNOSIS — F17200 Nicotine dependence, unspecified, uncomplicated: Secondary | ICD-10-CM | POA: Diagnosis not present

## 2013-03-21 DIAGNOSIS — E119 Type 2 diabetes mellitus without complications: Secondary | ICD-10-CM

## 2013-03-21 DIAGNOSIS — Z860100 Personal history of colon polyps, unspecified: Secondary | ICD-10-CM

## 2013-03-21 DIAGNOSIS — I10 Essential (primary) hypertension: Secondary | ICD-10-CM | POA: Diagnosis not present

## 2013-03-21 DIAGNOSIS — E669 Obesity, unspecified: Secondary | ICD-10-CM | POA: Diagnosis not present

## 2013-03-21 DIAGNOSIS — K4020 Bilateral inguinal hernia, without obstruction or gangrene, not specified as recurrent: Secondary | ICD-10-CM

## 2013-03-21 DIAGNOSIS — IMO0001 Reserved for inherently not codable concepts without codable children: Secondary | ICD-10-CM

## 2013-03-21 LAB — MICROALBUMIN / CREATININE URINE RATIO
Creatinine,U: 167 mg/dL
Microalb Creat Ratio: 0.4 mg/g (ref 0.0–30.0)
Microalb, Ur: 0.6 mg/dL (ref 0.0–1.9)

## 2013-03-21 LAB — HEMOGLOBIN A1C: Hgb A1c MFr Bld: 7 % — ABNORMAL HIGH (ref 4.6–6.5)

## 2013-03-21 NOTE — Progress Notes (Signed)
Subjective:    Patient ID: [name], male    DOB: 1955-03-11, 58 y.o.   MRN: [id number]  HPI 58 year old patient who is seen today for followup of type 2 diabetes.  There's been some significant weight gain secondary to recent orthopedic surgery and inactivity.  His has uncomplicated left total hip replacement surgery. Medical regimen has included Victoza.  He has not been able to tolerate full dosing due to nausea.  Remains on oral medications as well. He has history of tobacco use, but states that he has been abstinent now for 6 months He has a history of bilateral angle hernias and has had evaluation by general surgery.  He is contemplating surgery. He continues to have significant back pain secondary to spinal stenosis He states blood sugars have been high, but trending down with the increase in his activity level. He has a history of colonic polyps and has had a recall letter from GI for followup colonoscopy. No recent eye examination.  This was recommended Past Medical History  Diagnosis Date  . History of hip replacement, total   . Diabetes mellitus   . Hypertension   . Hyperlipidemia   . GERD (gastroesophageal reflux disease)   . Obesity   . Chronic pain   . History of colonic polyps   . RBBB (right bundle branch block with left anterior fascicular block)   . 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 Left anterior hemiblock   . Adenomatous polyp     History   Social History  . Marital Status: Married    Spouse Name: N/A    Number of Children: N/A  . Years of Education: N/A   Occupational History  . Not on file.   Social History Main Topics  . Smoking status: Former Smoker    Quit date: 01/04/2007  . Smokeless tobacco: Not on file  . Alcohol Use: No     Comment: recovering alcoholic  . Drug Use: Not on file  . Sexual Activity: Not on file   Other Topics Concern  . Not on file   Social History Narrative   Recovering alcoholic      Minister     Past Surgical History  Procedure Laterality Date  . Hip surgery  12/29/09    R THR  (Nitka)  . Appendectomy    . Foot surgery    . Cervical laminectomy    . Cervical fusion    . Hip surgery      bilateral  . Total knee arthroplasty  01/2008    left knee  . Total hip arthroplasty Left 09/10/2012    Procedure: REMOVAL OF HARDWARE LEFT PROXIMAL THIGH/FEMUR AND LEFT TOTAL HIP ARTHROPLASTY;  Surgeon: Jessy Oto, MD;  Location: [facility];  Service: Orthopedics;  Laterality: Left;  . Hardware removal Left 09/10/2012    Procedure: HARDWARE REMOVAL;  Surgeon: Jessy Oto, MD;  Location: [facility];  Service: Orthopedics;  Laterality: Left;    Family History  Problem Relation Age of Onset  . Cancer Mother     lung  . Crohn's disease Father   . Alcohol abuse Brother     Allergies  Allergen Reactions  . Aspirin     REACTION: unspecified  . Codeine     REACTION: itch  . Morphine And Related Nausea And Vomiting  . Naproxen     REACTION: anaphylaxis    Current Outpatient Prescriptions on File Prior to Visit  Medication Sig Dispense Refill  . atorvastatin (LIPITOR) 40 MG tablet Take 1 tablet (40 mg total) by mouth daily.  90 tablet  3  . diclofenac sodium (VOLTAREN) 1 % GEL Apply topically.        Marland Kitchen glimepiride (AMARYL) 4 MG tablet Take 1 tablet (4 mg total) by mouth daily as needed.  90 tablet  3  . glucose blood test strip 1 each by Other route daily as needed for other. Use as instructed  100 each  12  . HYDROcodone-acetaminophen (NORCO) 7.5-325 MG per tablet Take 1-2 tablets by mouth every 4 (four) hours as needed for pain (maximum of 8 total pain medication and tylenol tablets per day.).  60 tablet  1  . Insulin Pen Needle (NOVOTWIST) 32G X 5 MM MISC 1 each by Does not apply route as directed.  100 each  3  . lidocaine (LIDODERM) 5 % Place 1 patch onto the skin as needed.       . Liraglutide (VICTOZA) 18 MG/3ML SOLN Inject 0.6mg  daily for 1 week then 1.2 mg daily for 1 week  then 1.8 mg daily as directed  6 mL  6  . lisinopril-hydrochlorothiazide (PRINZIDE,ZESTORETIC) 20-25 MG per tablet Take 1 tablet by mouth every morning.      . metFORMIN (GLUCOPHAGE) 1000 MG tablet Take 1 tablet (1,000 mg total) by mouth 2 (two) times daily with a meal.  180 tablet  3  . methadone (DOLOPHINE) 10 MG tablet Take 1 tablet (10 mg total) by mouth every morning. 1-4 by mouth daily  90 tablet  0  . methocarbamol (ROBAXIN) 750 MG tablet Take 1 tablet (750 mg total) by mouth every 6 (six) hours as needed.  90 tablet  4  . Multiple Vitamins-Minerals (MULTIVITAMIN WITH MINERALS) tablet Take 1 tablet by mouth daily.      Marland Kitchen omeprazole (PRILOSEC) 20 MG capsule Take 1 capsule (20 mg total) by mouth daily.  90 capsule  4  . pioglitazone (ACTOS) 45 MG tablet Take 1 tablet (45 mg total) by mouth daily.  90 tablet  3   No current facility-administered medications on file prior to visit.    BP 150/80  Pulse 88  Temp(Src) 98.1 F (36.7 C) (Oral)  Resp 20  Ht 5' 11.5" (1.816 m)  Wt 291 lb (131.997 kg)  BMI 40.03 kg/m2  SpO2 98%        Review of Systems  Constitutional: Negative for fever, chills, appetite change and fatigue.  HENT: Negative for congestion, dental problem, ear pain, hearing loss, sore throat, tinnitus, trouble swallowing and voice change.   Eyes: Negative for pain, discharge and visual disturbance.  Respiratory: Negative for cough, chest tightness, wheezing and stridor.   Cardiovascular: Negative for chest pain, palpitations and leg swelling.  Gastrointestinal: Negative for nausea, vomiting, abdominal pain, diarrhea, constipation, blood in stool and abdominal distention.  Genitourinary: Negative for urgency, hematuria, flank pain, discharge, difficulty urinating and genital sores.  Musculoskeletal: Positive for arthralgias, back pain and gait problem. Negative for joint swelling, myalgias and neck stiffness.  Skin: Negative for rash.  Neurological: Positive for  numbness. Negative for dizziness, syncope, speech difficulty, weakness and headaches.  Hematological: Negative for adenopathy. Does not bruise/bleed easily.  Psychiatric/Behavioral: Negative for behavioral problems and dysphoric mood. The patient is not nervous/anxious.        Objective:   Physical Exam  Constitutional: He is oriented to person, place, and time. He appears well-developed.  Weight 290 1 repeat blood pressure  120/78   HENT:  Head: Normocephalic.  Right Ear: External ear normal.  Left Ear: External ear normal.  Eyes: Conjunctivae and EOM are normal.  Neck: Normal range of motion.  Cardiovascular: Normal rate and normal heart sounds.   Pulmonary/Chest: Breath sounds normal.  Abdominal: Bowel sounds are normal.  Musculoskeletal: Normal range of motion. He exhibits no edema and no tenderness.  Neurological: He is alert and oriented to person, place, and time.  Psychiatric: He has a normal mood and affect. His behavior is normal.          Assessment & Plan:  Diabetes mellitus.  We'll check a hemoglobin A1c.  We'll continue efforts at weight loss and improvement in his activity level.  We'll continue on low intensity Victoza and uptitrate in the future if nausea per minute Obesity.  Weight loss encouraged Chronic low back pain secondary to spinal stenosis and osteoarthritis.  We'll check a urine drug screen.  Remains on chronic methadone. Hypertension stable Dyslipidemia.  Continue atorvastatin  I examination recommended Followup colonoscopy.  Encouraged

## 2013-03-21 NOTE — Progress Notes (Signed)
Pre-visit discussion using our clinic review tool. No additional management support is needed unless otherwise documented below in the visit note.  

## 2013-03-21 NOTE — Patient Instructions (Signed)
Please check your hemoglobin A1c every 3 months  You need to lose weight.  Consider a lower calorie diet and regular exercise.    It is important that you exercise regularly, at least 20 minutes 3 to 4 times per week.  If you develop chest pain or shortness of breath seek  medical attention.  Schedule your colonoscopy to help detect colon cancer.  Please see your eye doctor yearly to check for diabetic eye damage

## 2013-03-22 ENCOUNTER — Telehealth: Payer: Self-pay | Admitting: Internal Medicine

## 2013-03-22 NOTE — Telephone Encounter (Signed)
Relevant patient education assigned to patient using Emmi. ° °

## 2013-03-26 ENCOUNTER — Telehealth: Payer: Self-pay | Admitting: Internal Medicine

## 2013-03-26 MED ORDER — PROMETHAZINE HCL 25 MG PO TABS: 25.0000 mg | ORAL_TABLET | Freq: Four times a day (QID) | ORAL | Status: DC | PRN

## 2013-03-26 NOTE — Telephone Encounter (Signed)
Please advise refill? 

## 2013-03-26 NOTE — Telephone Encounter (Signed)
[place], [place] requesting re-fill on promethazine (PHENERGAN) 25 MG tablet [[id number]]

## 2013-03-26 NOTE — Telephone Encounter (Signed)
Pt need re-fill on methadone (DOLOPHINE) 10 MG tablet

## 2013-03-26 NOTE — Telephone Encounter (Signed)
Ok #60 max/month

## 2013-03-26 NOTE — Telephone Encounter (Signed)
Dr. Raliegh Ip, pt needs refill on Methadone. Is he suppose to take one tablet daily for 1 to 4 tablets daily?

## 2013-03-26 NOTE — Telephone Encounter (Signed)
Rx sent to pharmacy   

## 2013-03-26 NOTE — Telephone Encounter (Signed)
Ok  #30 

## 2013-03-27 MED ORDER — METHADONE HCL 10 MG PO TABS: ORAL_TABLET | ORAL | Status: DC

## 2013-03-27 NOTE — Telephone Encounter (Signed)
Pt notified Rx ready for pickup, will be at the front desk. Rx printed and signed. 

## 2013-03-29 ENCOUNTER — Telehealth: Payer: Self-pay

## 2013-03-29 NOTE — Telephone Encounter (Signed)
Relevant patient education assigned to patient using Emmi. ° °

## 2013-04-15 ENCOUNTER — Other Ambulatory Visit: Payer: Self-pay | Admitting: Internal Medicine

## 2013-04-16 ENCOUNTER — Telehealth: Payer: Self-pay | Admitting: Internal Medicine

## 2013-04-16 MED ORDER — OMEPRAZOLE 20 MG PO CPDR: 20.0000 mg | DELAYED_RELEASE_CAPSULE | Freq: Every day | ORAL | Status: DC

## 2013-04-16 NOTE — Telephone Encounter (Signed)
Rx sent 

## 2013-04-16 NOTE — Telephone Encounter (Signed)
[facility] - [place], [place] - [address] is requesting re-fill on omeprazole (PRILOSEC) 20 MG capsule ° °

## 2013-04-18 ENCOUNTER — Encounter: Payer: Self-pay | Admitting: Internal Medicine

## 2013-04-25 ENCOUNTER — Telehealth: Payer: Self-pay | Admitting: Internal Medicine

## 2013-04-25 NOTE — Telephone Encounter (Signed)
I received a fax denying Omeprazole.  Pt must try and fail OTC Prilosec, Prevacid 24 hr, Zegerid, histamine-2 antagonist due to intolerability or lack of effectiveness.

## 2013-04-26 ENCOUNTER — Other Ambulatory Visit: Payer: Self-pay | Admitting: Internal Medicine

## 2013-04-26 NOTE — Telephone Encounter (Signed)
Spoke to pt told him Omeprazole was denied by [employer]. Pt verbalized understanding and stated he was aware and that his has been denied I have been paying for it for 20 years. Told pt okay just wanted him to be aware.

## 2013-04-26 NOTE — Telephone Encounter (Signed)
Noted, please notify patient

## 2013-04-26 NOTE — Telephone Encounter (Signed)
Please see message and advise 

## 2013-05-03 DIAGNOSIS — R350 Frequency of micturition: Secondary | ICD-10-CM | POA: Diagnosis not present

## 2013-05-03 DIAGNOSIS — N401 Enlarged prostate with lower urinary tract symptoms: Secondary | ICD-10-CM | POA: Diagnosis not present

## 2013-05-03 DIAGNOSIS — N139 Obstructive and reflux uropathy, unspecified: Secondary | ICD-10-CM | POA: Diagnosis not present

## 2013-05-03 DIAGNOSIS — B356 Tinea cruris: Secondary | ICD-10-CM | POA: Diagnosis not present

## 2013-05-08 DIAGNOSIS — M719 Bursopathy, unspecified: Secondary | ICD-10-CM | POA: Diagnosis not present

## 2013-05-08 DIAGNOSIS — M48061 Spinal stenosis, lumbar region without neurogenic claudication: Secondary | ICD-10-CM | POA: Diagnosis not present

## 2013-05-08 DIAGNOSIS — M67919 Unspecified disorder of synovium and tendon, unspecified shoulder: Secondary | ICD-10-CM | POA: Diagnosis not present

## 2013-06-21 ENCOUNTER — Ambulatory Visit (INDEPENDENT_AMBULATORY_CARE_PROVIDER_SITE_OTHER): Payer: 59 | Admitting: Internal Medicine

## 2013-06-21 ENCOUNTER — Encounter: Payer: Self-pay | Admitting: Internal Medicine

## 2013-06-21 VITALS — BP 140/80 | HR 93 | Temp 98.4°F | Resp 20 | Ht 71.5 in | Wt 307.0 lb

## 2013-06-21 DIAGNOSIS — E785 Hyperlipidemia, unspecified: Secondary | ICD-10-CM

## 2013-06-21 DIAGNOSIS — M1610 Unilateral primary osteoarthritis, unspecified hip: Secondary | ICD-10-CM

## 2013-06-21 DIAGNOSIS — G8929 Other chronic pain: Secondary | ICD-10-CM

## 2013-06-21 DIAGNOSIS — M1612 Unilateral primary osteoarthritis, left hip: Secondary | ICD-10-CM

## 2013-06-21 DIAGNOSIS — M549 Dorsalgia, unspecified: Secondary | ICD-10-CM

## 2013-06-21 DIAGNOSIS — E119 Type 2 diabetes mellitus without complications: Secondary | ICD-10-CM

## 2013-06-21 DIAGNOSIS — N400 Enlarged prostate without lower urinary tract symptoms: Secondary | ICD-10-CM

## 2013-06-21 LAB — HEMOGLOBIN A1C: Hgb A1c MFr Bld: 8.5 % — ABNORMAL HIGH (ref 4.6–6.5)

## 2013-06-21 LAB — PSA: PSA: 0.15 ng/mL (ref 0.10–4.00)

## 2013-06-21 MED ORDER — METHADONE HCL 10 MG PO TABS: ORAL_TABLET | ORAL | Status: DC

## 2013-06-21 MED ORDER — PROMETHAZINE HCL 25 MG PO TABS: 25.0000 mg | ORAL_TABLET | Freq: Four times a day (QID) | ORAL | Status: DC | PRN

## 2013-06-21 NOTE — Patient Instructions (Signed)
Please check your hemoglobin A1c every 3 months    It is important that you exercise regularly, at least 20 minutes 3 to 4 times per week.  If you develop chest pain or shortness of breath seek  medical attention.  You need to lose weight.  Consider a lower calorie diet and regular exercise. 

## 2013-06-21 NOTE — Progress Notes (Signed)
Pre-visit discussion using our clinic review tool. No additional management support is needed unless otherwise documented below in the visit note.  

## 2013-06-21 NOTE — Progress Notes (Signed)
Subjective:    Patient ID: [name], male    DOB: 1955-12-01, 58 y.o.   MRN: [id number]  HPI A 58 year old patient who is seen today for his quarterly followup.  He has type 2 diabetes.  His last hemoglobin A1c 7 point 0.  He is status post fairly recent left total hip replacement surgery and has been much less active.  There has been significant weight gain.  He remains on oral medications, as well as Victoza , which he takes on a submaximal dose due to nausea.  He has hypertension and dyslipidemia He has chronic low back pain and states that he has been out of methadone since he received only 60 tablets, rather than 90 tablets.  He states 90 tablets, generally last 3 months. He has been on methadone literally for decades and has been evaluated by pain management in the past. He remains off tobacco products since September of last year  Past Medical History  Diagnosis Date  . History of hip replacement, total   . Diabetes mellitus   . Hypertension   . Hyperlipidemia   . GERD (gastroesophageal reflux disease)   . Obesity   . Chronic pain   . History of colonic polyps   . RBBB (right bundle branch block with left anterior fascicular block)   . 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 Left anterior hemiblock   . Adenomatous polyp     History   Social History  . Marital Status: Married    Spouse Name: N/A    Number of Children: N/A  . Years of Education: N/A   Occupational History  . Not on file.   Social History Main Topics  . Smoking status: Former Smoker    Quit date: 01/04/2007  . Smokeless tobacco: Not on file  . Alcohol Use: No     Comment: recovering alcoholic  . Drug Use: Not on file  . Sexual Activity: Not on file   Other Topics Concern  . Not on file   Social History Narrative   Recovering alcoholic      Minister     Past Surgical History  Procedure Laterality Date  . Hip surgery  12/29/09    R THR  (Nitka)  . Appendectomy    .  Foot surgery    . Cervical laminectomy    . Cervical fusion    . Hip surgery      bilateral  . Total knee arthroplasty  01/2008    left knee  . Total hip arthroplasty Left 09/10/2012    Procedure: REMOVAL OF HARDWARE LEFT PROXIMAL THIGH/FEMUR AND LEFT TOTAL HIP ARTHROPLASTY;  Surgeon: Jessy Oto, MD;  Location: [facility];  Service: Orthopedics;  Laterality: Left;  . Hardware removal Left 09/10/2012    Procedure: HARDWARE REMOVAL;  Surgeon: Jessy Oto, MD;  Location: [facility];  Service: Orthopedics;  Laterality: Left;    Family History  Problem Relation Age of Onset  . Cancer Mother     lung  . Crohn's disease Father   . Alcohol abuse Brother     Allergies  Allergen Reactions  . Aspirin     REACTION: unspecified  . Codeine     REACTION: itch  . Morphine And Related Nausea And Vomiting  . Naproxen     REACTION: anaphylaxis    Current Outpatient Prescriptions on File Prior to Visit  Medication Sig Dispense Refill  . atorvastatin (LIPITOR) 40 MG  tablet Take 1 tablet by mouth  daily  90 tablet  1  . diclofenac sodium (VOLTAREN) 1 % GEL Apply topically.        Marland Kitchen glimepiride (AMARYL) 4 MG tablet Take 1 tablet by mouth  daily as needed  90 tablet  1  . glucose blood test strip 1 each by Other route daily as needed for other. Use as instructed  100 each  12  . Insulin Pen Needle (NOVOTWIST) 32G X 5 MM MISC 1 each by Does not apply route as directed.  100 each  3  . lidocaine (LIDODERM) 5 % Place 1 patch onto the skin as needed.       . Liraglutide (VICTOZA) 18 MG/3ML SOLN Inject 0.6mg  daily for 1 week then 1.2 mg daily for 1 week then 1.8 mg daily as directed  6 mL  6  . lisinopril-hydrochlorothiazide (PRINZIDE,ZESTORETIC) 20-25 MG per tablet Take 1 tablet by mouth  daily  90 tablet  1  . metFORMIN (GLUCOPHAGE) 1000 MG tablet TAKE 1 TABLET (1,000 MG TOTAL) BY MOUTH 2 (TWO) TIMES DAILY WITH A MEAL.  60 tablet  2  . methadone (DOLOPHINE) 10 MG tablet Take one to four tablets daily as  needed  60 tablet  0  . methocarbamol (ROBAXIN) 750 MG tablet Take 1 tablet by mouth  every 6 hours as needed  90 tablet  1  . Multiple Vitamins-Minerals (MULTIVITAMIN WITH MINERALS) tablet Take 1 tablet by mouth daily.      Marland Kitchen omeprazole (PRILOSEC) 20 MG capsule Take 1 capsule (20 mg total) by mouth daily.  90 capsule  3  . pioglitazone (ACTOS) 45 MG tablet Take 1 tablet (45 mg total) by mouth daily.  90 tablet  1  . promethazine (PHENERGAN) 25 MG tablet Take 1 tablet (25 mg total) by mouth every 6 (six) hours as needed for nausea or vomiting.  30 tablet  0  . Sildenafil Citrate (VIAGRA PO) Take 1 tablet by mouth as needed.      Marland Kitchen HYDROcodone-acetaminophen (NORCO) 7.5-325 MG per tablet Take 1-2 tablets by mouth every 4 (four) hours as needed for pain (maximum of 8 total pain medication and tylenol tablets per day.).  60 tablet  1   No current facility-administered medications on file prior to visit.    BP 140/80  Pulse 93  Temp(Src) 98.4 F (36.9 C) (Oral)  Resp 20  Ht 5' 11.5" (1.816 m)  Wt 307 lb (139.254 kg)  BMI 42.23 kg/m2  SpO2 98%      Review of Systems  Constitutional: Positive for activity change and unexpected weight change. Negative for fever, chills, appetite change and fatigue.  HENT: Negative for congestion, dental problem, ear pain, hearing loss, sore throat, tinnitus, trouble swallowing and voice change.   Eyes: Negative for pain, discharge and visual disturbance.  Respiratory: Negative for cough, chest tightness, wheezing and stridor.   Cardiovascular: Negative for chest pain, palpitations and leg swelling.  Gastrointestinal: Negative for nausea, vomiting, abdominal pain, diarrhea, constipation, blood in stool and abdominal distention.  Genitourinary: Negative for urgency, hematuria, flank pain, discharge, difficulty urinating and genital sores.  Musculoskeletal: Positive for back pain. Negative for arthralgias, gait problem, joint swelling, myalgias and neck  stiffness.  Skin: Negative for rash.  Neurological: Positive for numbness (chronic numbness left leg). Negative for dizziness, syncope, speech difficulty, weakness and headaches.  Hematological: Negative for adenopathy. Does not bruise/bleed easily.  Psychiatric/Behavioral: Negative for behavioral problems and dysphoric mood. The patient is  not nervous/anxious.        Objective:   Physical Exam  Constitutional: He is oriented to person, place, and time. He appears well-developed.  Weight 307 Repeat blood pressure 110/70  HENT:  Head: Normocephalic.  Right Ear: External ear normal.  Left Ear: External ear normal.  Eyes: Conjunctivae and EOM are normal.  Neck: Normal range of motion.  Cardiovascular: Normal rate, normal heart sounds and intact distal pulses.   Pulmonary/Chest: Breath sounds normal.  Abdominal: Bowel sounds are normal.  Musculoskeletal: Normal range of motion. He exhibits no edema and no tenderness.  Neurological: He is alert and oriented to person, place, and time.  Psychiatric: He has a normal mood and affect. His behavior is normal.          Assessment & Plan:   Diabetes mellitus type 2.  We'll check hemoglobin A1c.  Suspect increased due to significant weight gain.  Presently, weight is trending down with active physical therapy and general improvement in his activity level.  We'll recheck in 3 months.  Medications refilled Hypertension stable Chronic low back pain.  Methadone refill Dyslipidemia.  Continue statin therapy Osteoarthritis Morbid obesity.  Weight loss encouraged.  He

## 2013-06-21 NOTE — Progress Notes (Signed)
   Subjective:    Patient ID: [name], male    DOB: 25-May-1955, 58 y.o.   MRN: [id number]  HPI Wt Readings from Last 3 Encounters:  06/21/13 307 lb (139.254 kg)  03/21/13 291 lb (131.997 kg)  12/03/12 281 lb (127.461 kg)   See above  Review of Systems See above    Objective:   Physical Exam See above       Assessment & Plan:  See above

## 2013-07-03 DIAGNOSIS — M48061 Spinal stenosis, lumbar region without neurogenic claudication: Secondary | ICD-10-CM | POA: Diagnosis not present

## 2013-07-03 DIAGNOSIS — M25519 Pain in unspecified shoulder: Secondary | ICD-10-CM | POA: Diagnosis not present

## 2013-07-19 ENCOUNTER — Other Ambulatory Visit: Payer: Self-pay | Admitting: Internal Medicine

## 2013-07-22 ENCOUNTER — Telehealth: Payer: Self-pay | Admitting: Internal Medicine

## 2013-07-22 MED ORDER — LIRAGLUTIDE 18 MG/3ML ~~LOC~~ SOPN: 1.8000 mg | PEN_INJECTOR | Freq: Every day | SUBCUTANEOUS | Status: DC

## 2013-07-22 NOTE — Telephone Encounter (Signed)
Rx sent 

## 2013-07-22 NOTE — Telephone Encounter (Signed)
[facility] is requesting re-fill on Liraglutide (VICTOZA) 18 MG/3ML SOLN

## 2013-08-02 DIAGNOSIS — M48061 Spinal stenosis, lumbar region without neurogenic claudication: Secondary | ICD-10-CM | POA: Diagnosis not present

## 2013-08-02 DIAGNOSIS — M25519 Pain in unspecified shoulder: Secondary | ICD-10-CM | POA: Diagnosis not present

## 2013-08-07 DIAGNOSIS — L570 Actinic keratosis: Secondary | ICD-10-CM | POA: Diagnosis not present

## 2013-08-07 DIAGNOSIS — L905 Scar conditions and fibrosis of skin: Secondary | ICD-10-CM | POA: Diagnosis not present

## 2013-08-07 DIAGNOSIS — D485 Neoplasm of uncertain behavior of skin: Secondary | ICD-10-CM | POA: Diagnosis not present

## 2013-08-07 DIAGNOSIS — L738 Other specified follicular disorders: Secondary | ICD-10-CM | POA: Diagnosis not present

## 2013-08-07 DIAGNOSIS — Z85828 Personal history of other malignant neoplasm of skin: Secondary | ICD-10-CM | POA: Diagnosis not present

## 2013-08-07 DIAGNOSIS — L538 Other specified erythematous conditions: Secondary | ICD-10-CM | POA: Diagnosis not present

## 2013-08-07 DIAGNOSIS — L821 Other seborrheic keratosis: Secondary | ICD-10-CM | POA: Diagnosis not present

## 2013-08-21 DIAGNOSIS — L570 Actinic keratosis: Secondary | ICD-10-CM | POA: Diagnosis not present

## 2013-08-21 DIAGNOSIS — L723 Sebaceous cyst: Secondary | ICD-10-CM | POA: Diagnosis not present

## 2013-09-16 ENCOUNTER — Other Ambulatory Visit (INDEPENDENT_AMBULATORY_CARE_PROVIDER_SITE_OTHER): Payer: 59

## 2013-09-16 DIAGNOSIS — IMO0001 Reserved for inherently not codable concepts without codable children: Secondary | ICD-10-CM

## 2013-09-16 DIAGNOSIS — I10 Essential (primary) hypertension: Secondary | ICD-10-CM | POA: Diagnosis not present

## 2013-09-16 DIAGNOSIS — R7989 Other specified abnormal findings of blood chemistry: Secondary | ICD-10-CM | POA: Diagnosis not present

## 2013-09-16 DIAGNOSIS — E1165 Type 2 diabetes mellitus with hyperglycemia: Principal | ICD-10-CM

## 2013-09-16 DIAGNOSIS — E785 Hyperlipidemia, unspecified: Secondary | ICD-10-CM

## 2013-09-16 LAB — LIPID PANEL
Cholesterol: 169 mg/dL (ref 0–200)
HDL: 26.4 mg/dL — ABNORMAL LOW (ref >39.00)
NonHDL: 142.6
Total CHOL/HDL Ratio: 6
Triglycerides: 348 mg/dL — ABNORMAL HIGH (ref 0.0–149.0)
VLDL: 69.6 mg/dL — ABNORMAL HIGH (ref 0.0–40.0)

## 2013-09-16 LAB — MICROALBUMIN / CREATININE URINE RATIO
Creatinine,U: 395.5 mg/dL
Microalb Creat Ratio: 1.8 mg/g (ref 0.0–30.0)
Microalb, Ur: 7.1 mg/dL — ABNORMAL HIGH (ref 0.0–1.9)

## 2013-09-16 LAB — BASIC METABOLIC PANEL
BUN: 18 mg/dL (ref 6–23)
CO2: 27 mEq/L (ref 19–32)
Calcium: 9.2 mg/dL (ref 8.4–10.5)
Chloride: 95 mEq/L — ABNORMAL LOW (ref 96–112)
Creatinine, Ser: 0.9 mg/dL (ref 0.4–1.5)
GFR: 95.12 mL/min (ref >60.00)
Glucose, Bld: 227 mg/dL — ABNORMAL HIGH (ref 70–99)
Potassium: 4 mEq/L (ref 3.5–5.1)
Sodium: 134 mEq/L — ABNORMAL LOW (ref 135–145)

## 2013-09-16 LAB — HEMOGLOBIN A1C: Hgb A1c MFr Bld: 10.1 % — ABNORMAL HIGH (ref 4.6–6.5)

## 2013-09-16 LAB — LDL CHOLESTEROL, DIRECT: Direct LDL: 107.9 mg/dL

## 2013-09-23 ENCOUNTER — Ambulatory Visit (INDEPENDENT_AMBULATORY_CARE_PROVIDER_SITE_OTHER): Payer: 59 | Admitting: Internal Medicine

## 2013-09-23 ENCOUNTER — Encounter: Payer: Self-pay | Admitting: Internal Medicine

## 2013-09-23 VITALS — BP 124/80 | HR 126 | Temp 98.0°F | Resp 20 | Ht 71.5 in | Wt 291.0 lb

## 2013-09-23 DIAGNOSIS — I10 Essential (primary) hypertension: Secondary | ICD-10-CM | POA: Diagnosis not present

## 2013-09-23 DIAGNOSIS — E785 Hyperlipidemia, unspecified: Secondary | ICD-10-CM

## 2013-09-23 DIAGNOSIS — M1990 Unspecified osteoarthritis, unspecified site: Secondary | ICD-10-CM

## 2013-09-23 DIAGNOSIS — E669 Obesity, unspecified: Secondary | ICD-10-CM

## 2013-09-23 DIAGNOSIS — E119 Type 2 diabetes mellitus without complications: Secondary | ICD-10-CM | POA: Diagnosis not present

## 2013-09-23 MED ORDER — METHADONE HCL 10 MG PO TABS: ORAL_TABLET | ORAL | Status: DC

## 2013-09-23 MED ORDER — PROMETHAZINE HCL 25 MG PO TABS: 25.0000 mg | ORAL_TABLET | Freq: Four times a day (QID) | ORAL | Status: DC | PRN

## 2013-09-23 MED ORDER — CANAGLIFLOZIN 300 MG PO TABS
300.0000 mg | ORAL_TABLET | Freq: Every day | ORAL | Status: DC
Start: 2013-09-23 — End: 2014-02-03

## 2013-09-23 NOTE — Progress Notes (Signed)
Subjective:    Patient ID: [name], male    DOB: [birth year], 58 y.o.   MRN: [id number]  HPI  Wt Readings from Last 3 Encounters:  09/23/13 291 lb (131.997 kg)  06/21/13 307 lb (139.254 kg)  03/21/13 291 lb (131.74 kg)   58 year old patient who is seen today for followup.  He has type 2 diabetes.  Hemoglobin A1c last week was greater than 10.  He takes Victoza  frequently due to nausea.  His weight is down 16 pounds.  He has been off tobacco products for one year. He has treated hypertension and dyslipidemia Complaints include significant postnasal drip.  He feels may be a factor with his nausea He has osteoarthritis, chronic back pain and is on chronic narcotics  He has been under situational stress due to the poor health of his mother, who has had a recent stroke  Past Medical History  Diagnosis Date  . History of hip replacement, total   . Diabetes mellitus   . Hypertension   . Hyperlipidemia   . GERD (gastroesophageal reflux disease)   . Obesity   . Chronic pain   . History of colonic polyps   . RBBB (right bundle branch block with left anterior fascicular block)   . 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 Left anterior hemiblock   . Adenomatous polyp     History   Social History  . Marital Status: Married    Spouse Name: N/A    Number of Children: N/A  . Years of Education: N/A   Occupational History  . Not on file.   Social History Main Topics  . Smoking status: Former Smoker    Quit date: 01/04/2007  . Smokeless tobacco: Not on file  . Alcohol Use: No     Comment: recovering alcoholic  . Drug Use: Not on file  . Sexual Activity: Not on file   Other Topics Concern  . Not on file   Social History Narrative   Recovering alcoholic      Minister     Past Surgical History  Procedure Laterality Date  . Hip surgery  12/29/09    R THR  (Nitka)  . Appendectomy    . Foot surgery    . Cervical laminectomy    . Cervical fusion     . Hip surgery      bilateral  . Total knee arthroplasty  01/2008    left knee  . Total hip arthroplasty Left 09/10/2012    Procedure: REMOVAL OF HARDWARE LEFT PROXIMAL THIGH/FEMUR AND LEFT TOTAL HIP ARTHROPLASTY;  Surgeon: Jessy Oto, MD;  Location: [facility];  Service: Orthopedics;  Laterality: Left;  . Hardware removal Left 09/10/2012    Procedure: HARDWARE REMOVAL;  Surgeon: Jessy Oto, MD;  Location: [facility];  Service: Orthopedics;  Laterality: Left;    Family History  Problem Relation Age of Onset  . Cancer Mother     lung  . Crohn's disease Father   . Alcohol abuse Brother     Allergies  Allergen Reactions  . Aspirin     REACTION: unspecified  . Codeine     REACTION: itch  . Morphine And Related Nausea And Vomiting  . Naproxen     REACTION: anaphylaxis    Current Outpatient Prescriptions on File Prior to Visit  Medication Sig Dispense Refill  . atorvastatin (LIPITOR) 40 MG tablet Take 1 tablet by mouth  daily  90  tablet  1  . diclofenac sodium (VOLTAREN) 1 % GEL Apply topically.        Marland Kitchen glimepiride (AMARYL) 4 MG tablet Take 1 tablet by mouth  daily as needed  90 tablet  1  . glucose blood test strip 1 each by Other route daily as needed for other. Use as instructed  100 each  12  . HYDROcodone-acetaminophen (NORCO) 7.5-325 MG per tablet Take 1-2 tablets by mouth every 4 (four) hours as needed for pain (maximum of 8 total pain medication and tylenol tablets per day.).  60 tablet  1  . Insulin Pen Needle (NOVOTWIST) 32G X 5 MM MISC 1 each by Does not apply route as directed.  100 each  3  . lidocaine (LIDODERM) 5 % Place 1 patch onto the skin as needed.       . Liraglutide 18 MG/3ML SOPN Inject 1.8 mg into the skin daily.  20 pen  1  . lisinopril-hydrochlorothiazide (PRINZIDE,ZESTORETIC) 20-25 MG per tablet Take 1 tablet by mouth  daily  90 tablet  1  . metFORMIN (GLUCOPHAGE) 1000 MG tablet Take 1 tablet by mouth   twice a day with a meal  180 tablet  1  . methocarbamol  (ROBAXIN) 750 MG tablet Take 1 tablet by mouth  every 6 hours as needed  90 tablet  1  . Multiple Vitamins-Minerals (MULTIVITAMIN WITH MINERALS) tablet Take 1 tablet by mouth daily.      Marland Kitchen omeprazole (PRILOSEC) 20 MG capsule Take 1 capsule (20 mg total) by mouth daily.  90 capsule  3  . pioglitazone (ACTOS) 45 MG tablet Take 1 tablet by mouth  daily  90 tablet  1  . promethazine (PHENERGAN) 25 MG tablet Take 1 tablet (25 mg total) by mouth every 6 (six) hours as needed for nausea or vomiting.  30 tablet  0  . Sildenafil Citrate (VIAGRA PO) Take 1 tablet by mouth as needed.       No current facility-administered medications on file prior to visit.    BP 124/80  Pulse 126  Temp(Src) 98 F (36.7 C) (Oral)  Resp 20  Ht 5' 11.5" (1.816 m)  Wt 291 lb (131.997 kg)  BMI 40.03 kg/m2  SpO2 98%     Review of Systems  Constitutional: Negative for fever, chills, appetite change and fatigue.  HENT: Positive for postnasal drip and rhinorrhea. Negative for congestion, dental problem, ear pain, hearing loss, sore throat, tinnitus, trouble swallowing and voice change.   Eyes: Negative for pain, discharge and visual disturbance.  Respiratory: Negative for cough, chest tightness, wheezing and stridor.   Cardiovascular: Negative for chest pain, palpitations and leg swelling.  Gastrointestinal: Positive for nausea. Negative for vomiting, abdominal pain, diarrhea, constipation, blood in stool and abdominal distention.  Genitourinary: Negative for urgency, hematuria, flank pain, discharge, difficulty urinating and genital sores.  Musculoskeletal: Positive for arthralgias and back pain. Negative for gait problem, joint swelling, myalgias and neck stiffness.  Skin: Negative for rash.  Neurological: Negative for dizziness, syncope, speech difficulty, weakness, numbness and headaches.  Hematological: Negative for adenopathy. Does not bruise/bleed easily.  Psychiatric/Behavioral: Negative for behavioral  problems and dysphoric mood. The patient is not nervous/anxious.        Objective:   Physical Exam  Constitutional: He is oriented to person, place, and time. He appears well-developed.  Obese.  Blood pressure well controlled  HENT:  Head: Normocephalic.  Right Ear: External ear normal.  Left Ear: External ear normal.  Eyes:  Conjunctivae and EOM are normal.  Neck: Normal range of motion.  Cardiovascular: Normal rate and normal heart sounds.   Pulmonary/Chest: Breath sounds normal.  Abdominal: Bowel sounds are normal.  Musculoskeletal: Normal range of motion. He exhibits no edema and no tenderness.  Neurological: He is alert and oriented to person, place, and time.  Psychiatric: He has a normal mood and affect. His behavior is normal.          Assessment & Plan:   Obesity.  Weight loss encouraged Diabetes mellitus poor control.  Lifestyle issues discussed.  Needs eye examination.  We'll add SGL T2, antagonist Hypertension well controlled Dyslipidemia.  Continue statin therapy Chronic pain.  Methadone refilled  Recheck 3 months

## 2013-09-23 NOTE — Progress Notes (Signed)
Pre visit review using our clinic review tool, if applicable. No additional management support is needed unless otherwise documented below in the visit note. 

## 2013-09-23 NOTE — Patient Instructions (Signed)
Please check your hemoglobin A1c every 3 months  You need to lose weight.  Consider a lower calorie diet and regular exercise.Diabetes and Exercise Exercising regularly is important. It is not just about losing weight. It has many health benefits, such as:  Improving your overall fitness, flexibility, and endurance.  Increasing your bone density.  Helping with weight control.  Decreasing your body fat.  Increasing your muscle strength.  Reducing stress and tension.  Improving your overall health. People with diabetes who exercise gain additional benefits because exercise:  Reduces appetite.  Improves the body's use of blood sugar (glucose).  Helps lower or control blood glucose.  Decreases blood pressure.  Helps control blood lipids (such as cholesterol and triglycerides).  Improves the body's use of the hormone insulin by:  Increasing the body's insulin sensitivity.  Reducing the body's insulin needs.  Decreases the risk for heart disease because exercising:  Lowers cholesterol and triglycerides levels.  Increases the levels of good cholesterol (such as high-density lipoproteins [HDL]) in the body.  Lowers blood glucose levels. YOUR ACTIVITY PLAN  Choose an activity that you enjoy and set realistic goals. Your health care provider or diabetes educator can help you make an activity plan that works for you. Exercise regularly as directed by your health care provider. This includes:  Performing resistance training twice a week such as push-ups, sit-ups, lifting weights, or using resistance bands.  Performing 150 minutes of cardio exercises each week such as walking, running, or playing sports.  Staying active and spending no more than 90 minutes at one time being inactive. Even short bursts of exercise are good for you. Three 10-minute sessions spread throughout the day are just as beneficial as a single 30-minute session. Some exercise ideas include:  Taking the  dog for a walk.  Taking the stairs instead of the elevator.  Dancing to your favorite song.  Doing an exercise video.  Doing your favorite exercise with a friend. RECOMMENDATIONS FOR EXERCISING WITH TYPE 1 OR TYPE 2 DIABETES   Check your blood glucose before exercising. If blood glucose levels are greater than 240 mg/dL, check for urine ketones. Do not exercise if ketones are present.  Avoid injecting insulin into areas of the body that are going to be exercised. For example, avoid injecting insulin into:  The arms when playing tennis.  The legs when jogging.  Keep a record of:  Food intake before and after you exercise.  Expected peak times of insulin action.  Blood glucose levels before and after you exercise.  The type and amount of exercise you have done.  Review your records with your health care provider. Your health care provider will help you to develop guidelines for adjusting food intake and insulin amounts before and after exercising.  If you take insulin or oral hypoglycemic agents, watch for signs and symptoms of hypoglycemia. They include:  Dizziness.  Shaking.  Sweating.  Chills.  Confusion.  Drink plenty of water while you exercise to prevent dehydration or heat stroke. Body water is lost during exercise and must be replaced.  Talk to your health care provider before starting an exercise program to make sure it is safe for you. Remember, almost any type of activity is better than none. Document Released: 03/12/2003 Document Revised: 05/06/2013 Document Reviewed: 05/29/2012 [facility] Patient Information 2015 [place], [place]. This information is not intended to replace advice given to you by your health care provider. Make sure you discuss any questions you have with your health care  provider. Diabetes, Eating Away From Home Sometimes, you might eat in a restaurant or have meals that are prepared by someone else. You can enjoy eating out. However, the  portions in restaurants may be much larger than needed. Listed below are some ideas to help you choose foods that will keep your blood glucose (sugar) in better control.  TIPS FOR EATING OUT  Know your meal plan and how many carbohydrate servings you should have at each meal. You may wish to carry a copy of your meal plan in your purse or wallet. Learn the foods included in each food group.  Make a list of restaurants near you that offer healthy choices. Take a copy of the carry-out menus to see what they offer. Then, you can plan what you will order ahead of time.  Become familiar with serving sizes by practicing them at home using measuring cups and spoons. Once you learn to recognize portion sizes, you will be able to correctly estimate the amount of total carbohydrate you are allowed to eat at the restaurant. Ask for a takeout box if the portion is more than you should have. When your food comes, leave the amount you should have on the plate, and put the rest in the takeout box before you start eating.  Plan ahead if your mealtime will be different from usual. Check with your caregiver to find out how to time meals and medicine if you are taking insulin.  Avoid high-fat foods, such as fried foods, cream sauces, high-fat salad dressings, or any added butter or margarine.  Do not be afraid to ask questions. Ask your server about the portion size, cooking methods, ingredients and if items can be substituted. Restaurants do not list all available items on the menu. You can ask for your main entree to be prepared using skim milk, oil instead of butter or margarine, and without gravy or sauces. Ask your waiter or waitress to serve salad dressings, gravy, sauces, margarine, and sour cream on the side. You can then add the amount your meal plan suggests.  Add more vegetables whenever possible.  Avoid items that are labeled "jumbo," "giant," "deluxe," or "supersized."  You may want to split an entre  with someone and order an extra side salad.  Watch for hidden calories in foods like croutons, bacon, or cheese.  Ask your server to take away the bread basket or chips from your table.  Order a dinner salad as an appetizer. You can eat most foods served in a restaurant. Some foods are better choices than others. Breads and Starches  Recommended: All kinds of bread (wheat, rye, white, oatmeal, [place], [place], raisin), hard or soft dinner rolls, frankfurter or hamburger buns, small bagels, small corn or whole-wheat flour tortillas.  Avoid: Frosted or glazed breads, butter rolls, egg or cheese breads, croissants, sweet rolls, pastries, coffee cake, glazed or frosted doughnuts, muffins. Crackers  Recommended: Animal crackers, graham, rye, saltine, oyster, and matzoth crackers. Bread sticks, melba toast, rusks, pretzels, popcorn (without fat), zwieback toast.  Avoid: High-fat snack crackers or chips. Buttered popcorn. Cereals  Recommended: Hot and cold cereals. Whole grains such as oatmeal or shredded wheat are good choices.  Avoid: Sugar-coated or granola type cereals. Potatoes/Pasta/Rice/Beans  Recommended: Order baked, boiled, or mashed potatoes, rice or noodles without added fat, whole beans. Order gravies, butter, margarine, or sauces on the side so you can control the amount you add.  Avoid: Hash browns or fried potatoes. Potatoes, pasta, or rice prepared with  cream or cheese sauce. Potato or pasta salads prepared with large amounts of dressing. Fried beans or fried rice. Vegetables  Recommended: Order steamed, baked, boiled, or stewed vegetables without sauces or extra fat. Ask that sauce be served on the side. If vegetables are not listed on the menu, ask what is available.  Avoid: Vegetables prepared with cream, butter, or cheese sauce. Fried vegetables. Salad Bars  Recommended: Many of the vegetables at a salad bar are considered "free." Use lemon juice, vinegar, or  low-calorie salad dressing (fewer than 20 calories per serving) as "free" dressings for your salad. Look for salad bar ingredients that have no added fat or sugar such as tomatoes, lettuce, cucumbers, broccoli, carrots, onions, and mushrooms.  Avoid: Prepared salads with large amounts of dressing, such as coleslaw, caesar salad, macaroni salad, bean salad, or carrot salad. Fruit  Recommended: Eat fresh fruit or fresh fruit salad without added dressing. A salad bar often offers fresh fruit choices, but canned fruit at a restaurant is usually packed in sugar or syrup.  Avoid: Sweetened canned or frozen fruits, plain or sweetened fruit juice. Fruit salads with dressing, sour cream, or sugar added to them. Meat and Meat Substitutes  Recommended: Order broiled, baked, roasted, or grilled meat, poultry, or fish. Trim off all visible fat. Do not eat the skin of poultry. The size stated on the menu is the raw weight. Meat shrinks by  in cooking (for example, 4 oz raw equals 3 oz cooked meat).  Avoid: Deep-fat fried meat, poultry, or fish. Breaded meats. Eggs  Recommended: Order soft, hard-cooked, poached, or scrambled eggs. Omelets may be okay, depending on what ingredients are added. Egg substitutes are also a good choice.  Avoid: Fried eggs, eggs prepared with cream or cheese sauce. Milk  Recommended: Order low-fat or fat-free milk according to your meal plan. Plain, nonfat yogurt or flavored yogurt with no sugar added may be used as a substitute for milk. Soy milk may also be used.  Avoid: Milk shakes or sweetened milk beverages. Soups and Combination Foods  Recommended: Clear broth or consomm are "free" foods and may be used as an appetizer. Broth-based soups with fat removed count as a starch serving and are preferred over cream soups. Soups made with beans or split peas may be eaten but count as a starch.  Avoid: Fatty soups, soup made with cream, cheese soup. Combination foods prepared  with excessive amounts of fat or with cream or cheese sauces. Desserts and Sweets  Recommended: Ask for fresh fruit. Sponge or angel food cake without icing, ice milk, no sugar added ice cream, sherbet, or frozen yogurt may fit into your meal plan occasionally.  Avoid: Pastries, puddings, pies, cakes with icing, custard, gelatin desserts. Fats and Oils  Recommended: Choose healthy fats such as olive oil, canola oil, or tub margarine, reduced fat or fat-free sour cream, cream cheese, avocado, or nuts.  Avoid: Any fats in excess of your allowed portion. Deep-fried foods or any food with a large amount of fat. Note: Ask for all fats to be served on the side, and limit your portion sizes according to your meal plan. Document Released: 12/20/2004 Document Revised: 03/14/2011 Document Reviewed: 03/19/2013 [facility] Patient Information 2015 [place], [place]. This information is not intended to replace advice given to you by your health care provider. Make sure you discuss any questions you have with your health care provider.

## 2013-09-26 ENCOUNTER — Other Ambulatory Visit: Payer: Self-pay | Admitting: Internal Medicine

## 2013-10-02 ENCOUNTER — Other Ambulatory Visit: Payer: Self-pay | Admitting: Internal Medicine

## 2013-10-02 ENCOUNTER — Telehealth: Payer: Self-pay | Admitting: Internal Medicine

## 2013-10-02 NOTE — Telephone Encounter (Signed)
[name] he received the mail order today for that rx   Canagliflozin (INVOKANA) 300 MG TABS

## 2013-10-02 NOTE — Telephone Encounter (Signed)
Pt said rx was sent to his mail order and he will not receive it before his samples that he was given runs out. Pt said the medicine was working and would like 2 weeks worth of medicine until he receive his mail order.   Pharmacy; [place]

## 2013-10-02 NOTE — Telephone Encounter (Signed)
Please find out what medication he needs?

## 2013-10-02 NOTE — Telephone Encounter (Signed)
Noted  

## 2013-10-03 ENCOUNTER — Ambulatory Visit (INDEPENDENT_AMBULATORY_CARE_PROVIDER_SITE_OTHER): Payer: 59

## 2013-10-03 DIAGNOSIS — Z23 Encounter for immunization: Secondary | ICD-10-CM | POA: Diagnosis not present

## 2013-10-09 ENCOUNTER — Telehealth: Payer: Self-pay | Admitting: Internal Medicine

## 2013-10-09 NOTE — Telephone Encounter (Signed)
Dr. Raliegh Ip, please see message from pt and advise.

## 2013-10-09 NOTE — Telephone Encounter (Signed)
Patient Information:  Caller Name: Bram  Phone: [phone]  Patient: [name], [name]  Gender: Male  DOB: Sep 16, 1955  Age: 58 Years  PCP:  Bluford Kaufmann (Family Practice > 16yrs old)  Office Follow Up:  Does the office need to follow up with this patient?: Yes  Instructions For The Office: Please review, call in Rx to local pharmacy if possible.  If not possible would come in but would rather try treatment first.  Can reach patient at  [phone].  RN Note:  Asking Rx be called to:   [place] [place] [phone] (Phone) [phone] (Fax)  Symptoms  Reason For Call & Symptoms: Is diabetic, due to blood sugars higher than 200, started Invokana on 9/21.  Dose at forst 100mg  and has increased to 300mg .  About 3-4 days after highest dose started began to have distal penis itching.  Noting side effects of Rx can cause rash, penis yeast infection, itching.  Describes itching as moderate persently. Blood sugars improved, now below 200.  Reviewed Health History In EMR: Yes  Reviewed Medications In EMR: Yes  Reviewed Allergies In EMR: Yes  Reviewed Surgeries / Procedures: Yes  Date of Onset of Symptoms: 10/07/2013  Treatments Tried: Applied Cortisone cream, then Mutirocin 2% cream but has not helped.  Treatments Tried Worked: No  Guideline(s) Used:  Itching - Localized  Penis and Scrotum Symptoms  Disposition Per Guideline:   See Today or Tomorrow in Office  Reason For Disposition Reached:   ALL other penis - scrotum symptoms (Exception: painless rash < 24 hours duration)  Advice Given:  Cleaning:  Wash the area once thoroughly with un-scented soap and water to remove any irritants.   Call Back If:  Rash lasts more than one day  Fever occurs  You become worse.  Patient Refused Recommendation:  Patient Requests Prescription  Thinks Sx due to [facility], requesting Rx to be called in.

## 2013-10-09 NOTE — Telephone Encounter (Signed)
Left detailed message Dr.K is gone for the day, will put message in his basket and someone will get back to him tomorrow.

## 2013-10-10 NOTE — Telephone Encounter (Signed)
Please have patient start using Lotrimin OTC twice daily

## 2013-10-10 NOTE — Telephone Encounter (Signed)
Pt stated that urologist gave him mystatin powder and its working.  He said if he needs the Lotrimin he will get it.  Nothing further needed

## 2013-11-13 DIAGNOSIS — M4806 Spinal stenosis, lumbar region: Secondary | ICD-10-CM | POA: Diagnosis not present

## 2013-11-13 DIAGNOSIS — M5442 Lumbago with sciatica, left side: Secondary | ICD-10-CM | POA: Diagnosis not present

## 2013-12-20 ENCOUNTER — Telehealth: Payer: Self-pay | Admitting: Internal Medicine

## 2013-12-20 ENCOUNTER — Other Ambulatory Visit: Payer: Self-pay | Admitting: Internal Medicine

## 2013-12-20 NOTE — Telephone Encounter (Signed)
Patient would like a re-fill on methadone (DOLOPHINE) 10 MG tablet. Patient is aware Dr. Raliegh Ip is out of the office till Monday.

## 2013-12-23 MED ORDER — METHADONE HCL 10 MG PO TABS: ORAL_TABLET | ORAL | Status: DC

## 2013-12-23 NOTE — Telephone Encounter (Signed)
Pt notified Rx ready for pickup. Rx printed and signed by Dr. Raliegh Ip.

## 2013-12-25 ENCOUNTER — Ambulatory Visit: Payer: 59 | Admitting: Internal Medicine

## 2013-12-30 DIAGNOSIS — M5406 Panniculitis affecting regions of neck and back, lumbar region: Secondary | ICD-10-CM | POA: Diagnosis not present

## 2013-12-30 DIAGNOSIS — M4806 Spinal stenosis, lumbar region: Secondary | ICD-10-CM | POA: Diagnosis not present

## 2014-01-02 ENCOUNTER — Encounter: Payer: Self-pay | Admitting: Gastroenterology

## 2014-01-02 ENCOUNTER — Encounter: Payer: Self-pay | Admitting: Internal Medicine

## 2014-01-02 ENCOUNTER — Ambulatory Visit (INDEPENDENT_AMBULATORY_CARE_PROVIDER_SITE_OTHER): Payer: 59 | Admitting: Internal Medicine

## 2014-01-02 VITALS — BP 126/78 | HR 96 | Temp 98.9°F | Wt 288.0 lb

## 2014-01-02 DIAGNOSIS — M549 Dorsalgia, unspecified: Secondary | ICD-10-CM | POA: Diagnosis not present

## 2014-01-02 DIAGNOSIS — E669 Obesity, unspecified: Secondary | ICD-10-CM

## 2014-01-02 DIAGNOSIS — E119 Type 2 diabetes mellitus without complications: Secondary | ICD-10-CM

## 2014-01-02 DIAGNOSIS — M150 Primary generalized (osteo)arthritis: Secondary | ICD-10-CM | POA: Diagnosis not present

## 2014-01-02 DIAGNOSIS — Z86010 Personal history of colonic polyps: Secondary | ICD-10-CM | POA: Diagnosis not present

## 2014-01-02 DIAGNOSIS — M8949 Other hypertrophic osteoarthropathy, multiple sites: Secondary | ICD-10-CM

## 2014-01-02 DIAGNOSIS — E785 Hyperlipidemia, unspecified: Secondary | ICD-10-CM | POA: Diagnosis not present

## 2014-01-02 DIAGNOSIS — I10 Essential (primary) hypertension: Secondary | ICD-10-CM | POA: Diagnosis not present

## 2014-01-02 DIAGNOSIS — M159 Polyosteoarthritis, unspecified: Secondary | ICD-10-CM

## 2014-01-02 DIAGNOSIS — G8929 Other chronic pain: Secondary | ICD-10-CM | POA: Diagnosis not present

## 2014-01-02 LAB — HEMOGLOBIN A1C: Hgb A1c MFr Bld: 9.5 % — ABNORMAL HIGH (ref 4.6–6.5)

## 2014-01-02 LAB — GLUCOSE, POCT (MANUAL RESULT ENTRY): POC Glucose: 253 mg/dl — AB (ref 70–99)

## 2014-01-02 NOTE — Progress Notes (Signed)
Subjective:    Patient ID: [name], male    DOB: 1955-12-31, 58 y.o.   MRN: [id number]  HPI  Lab Results  Component Value Date   HGBA1C 10.1* 09/16/2013   Wt Readings from Last 3 Encounters:  01/02/14 288 lb (130.636 kg)  09/23/13 291 lb (131.997 kg)  06/21/13 307 lb (139.109 kg)   58 year old patient who is seen today for follow-up of type 2 diabetes.  This has been poorly controlled on maximal oral medication.  His last him globin A1c was 10 point 1. He is on a full dose of sulfonylurea and metformin as well as Actos.  He discontinued Invokanna after 4 weeks due to genital fungal infections.  He is unable tolerate a maximal dose of Victoza due to nausea and presently is on approximately 0.6 milligrams daily.  He states blood sugars are rarely less than 200 unless he exercises.  Activities are limited by chronic back pain and obesity No eye examination this year Due for follow-up colonoscopy  Past Medical History  Diagnosis Date  . History of hip replacement, total   . Diabetes mellitus   . Hypertension   . Hyperlipidemia   . GERD (gastroesophageal reflux disease)   . Obesity   . Chronic pain   . History of colonic polyps   . RBBB (right bundle branch block with left anterior fascicular block)   . 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 Left anterior hemiblock   . Adenomatous polyp     History   Social History  . Marital Status: Married    Spouse Name: N/A    Number of Children: N/A  . Years of Education: N/A   Occupational History  . Not on file.   Social History Main Topics  . Smoking status: Former Smoker    Quit date: 01/04/2007  . Smokeless tobacco: Not on file  . Alcohol Use: No     Comment: recovering alcoholic  . Drug Use: Not on file  . Sexual Activity: Not on file   Other Topics Concern  . Not on file   Social History Narrative   Recovering alcoholic      Minister     Past Surgical History  Procedure Laterality Date   . Hip surgery  12/29/09    R THR  ([place])  . Appendectomy    . Foot surgery    . Cervical laminectomy    . Cervical fusion    . Hip surgery      bilateral  . Total knee arthroplasty  01/2008    left knee  . Total hip arthroplasty Left 09/10/2012    Procedure: REMOVAL OF HARDWARE LEFT PROXIMAL THIGH/FEMUR AND LEFT TOTAL HIP ARTHROPLASTY;  Surgeon: Jessy Oto, MD;  Location: [place];  Service: Orthopedics;  Laterality: Left;  . Hardware removal Left 09/10/2012    Procedure: HARDWARE REMOVAL;  Surgeon: Jessy Oto, MD;  Location: [place];  Service: Orthopedics;  Laterality: Left;    Family History  Problem Relation Age of Onset  . Cancer Mother     lung  . Crohn's disease Father   . Alcohol abuse Brother     Allergies  Allergen Reactions  . Aspirin     REACTION: unspecified  . Codeine     REACTION: itch  . Morphine And Related Nausea And Vomiting  . Naproxen     REACTION: anaphylaxis    Current Outpatient Prescriptions on File Prior to  Visit  Medication Sig Dispense Refill  . atorvastatin (LIPITOR) 40 MG tablet Take 1 tablet by mouth  daily 90 tablet 1  . diclofenac sodium (VOLTAREN) 1 % GEL Apply topically.      Marland Kitchen glimepiride (AMARYL) 4 MG tablet Take 1 tablet by mouth  daily as needed 90 tablet 1  . glucose blood test strip 1 each by Other route daily as needed for other. Use as instructed 100 each 12  . HYDROcodone-acetaminophen (NORCO) 7.5-325 MG per tablet Take 1-2 tablets by mouth every 4 (four) hours as needed for pain (maximum of 8 total pain medication and tylenol tablets per day.). 60 tablet 1  . Insulin Pen Needle (NOVOTWIST) 32G X 5 MM MISC 1 each by Does not apply route as directed. 100 each 3  . lidocaine (LIDODERM) 5 % Place 1 patch onto the skin as needed.     . Liraglutide 18 MG/3ML SOPN Inject 1.8 mg into the skin daily. 20 pen 1  . lisinopril-hydrochlorothiazide (PRINZIDE,ZESTORETIC) 20-25 MG per tablet Take 1 tablet by mouth  daily 90 tablet 1  .  metFORMIN (GLUCOPHAGE) 1000 MG tablet Take 1 tablet by mouth  twice a day with a meal 180 tablet 1  . methadone (DOLOPHINE) 10 MG tablet Take one to four tablets daily as needed 90 tablet 0  . methocarbamol (ROBAXIN) 750 MG tablet Take 1 tablet by mouth  every 6 hours as needed 90 tablet 1  . Multiple Vitamins-Minerals (MULTIVITAMIN WITH MINERALS) tablet Take 1 tablet by mouth daily.    Marland Kitchen omeprazole (PRILOSEC) 20 MG capsule Take 1 capsule (20 mg total) by mouth daily. 90 capsule 3  . pioglitazone (ACTOS) 45 MG tablet Take 1 tablet by mouth  daily 90 tablet 1  . promethazine (PHENERGAN) 25 MG tablet Take 1 tablet by mouth  every 6 hours as needed for nausea or vomiting. 30 tablet 2  . Sildenafil Citrate (VIAGRA PO) Take 1 tablet by mouth as needed.    . Canagliflozin (INVOKANA) 300 MG TABS Take 1 tablet (300 mg total) by mouth daily. (Patient not taking: Reported on 01/02/2014) 30 tablet 6   No current facility-administered medications on file prior to visit.    BP 126/78 mmHg  Pulse 96  Temp(Src) 98.9 F (37.2 C) (Oral)  Wt 288 lb (130.636 kg)  SpO2 98%     Review of Systems  Constitutional: Negative for fever, chills, appetite change and fatigue.  HENT: Negative for congestion, dental problem, ear pain, hearing loss, sore throat, tinnitus, trouble swallowing and voice change.   Eyes: Negative for pain, discharge and visual disturbance.  Respiratory: Negative for cough, chest tightness, wheezing and stridor.   Cardiovascular: Negative for chest pain, palpitations and leg swelling.  Gastrointestinal: Negative for nausea, vomiting, abdominal pain, diarrhea, constipation, blood in stool and abdominal distention.  Genitourinary: Negative for urgency, hematuria, flank pain, discharge, difficulty urinating and genital sores.  Musculoskeletal: Positive for back pain and arthralgias. Negative for myalgias, joint swelling, gait problem and neck stiffness.  Skin: Negative for rash.    Neurological: Negative for dizziness, syncope, speech difficulty, weakness, numbness and headaches.  Hematological: Negative for adenopathy. Does not bruise/bleed easily.  Psychiatric/Behavioral: Negative for behavioral problems and dysphoric mood. The patient is not nervous/anxious.        Objective:   Physical Exam  Constitutional: He is oriented to person, place, and time. He appears well-developed.  Obese Blood pressure low normal  HENT:  Head: Normocephalic.  Right Ear: External ear  normal.  Left Ear: External ear normal.  Eyes: Conjunctivae and EOM are normal.  Neck: Normal range of motion.  Cardiovascular: Normal rate, normal heart sounds and intact distal pulses.   Pulmonary/Chest: Breath sounds normal.  Abdominal: Bowel sounds are normal.  Musculoskeletal: Normal range of motion. He exhibits no edema or tenderness.  Neurological: He is alert and oriented to person, place, and time.  Psychiatric: He has a normal mood and affect. His behavior is normal.          Assessment & Plan:   Diabetes mellitus.  Poor control.  Will recheck a hemoglobin A1c.  If greater than 8 (almost a certainty).  Will start on insulin therapy Morbid obesity Hypertension, well-controlled History colonic polyps.  Will schedule GI follow-up for follow-up colonoscopy  Examination encouraged Foot examination performed today and normal

## 2014-01-02 NOTE — Progress Notes (Signed)
Pre visit review using our clinic review tool, if applicable. No additional management support is needed unless otherwise documented below in the visit note. 

## 2014-01-02 NOTE — Patient Instructions (Signed)
Please see your eye doctor yearly to check for diabetic eye damage  Schedule your colonoscopy to help detect colon cancer.   Please check your hemoglobin A1c every 3 months  You need to lose weight.  Consider a lower calorie diet and regular exercise.    It is important that you exercise regularly, at least 20 minutes 3 to 4 times per week.  If you develop chest pain or shortness of breath seek  medical attention.

## 2014-01-06 ENCOUNTER — Telehealth: Payer: Self-pay | Admitting: Internal Medicine

## 2014-01-06 NOTE — Telephone Encounter (Signed)
emmi mailed  °

## 2014-01-23 ENCOUNTER — Other Ambulatory Visit: Payer: Self-pay | Admitting: Internal Medicine

## 2014-01-30 ENCOUNTER — Ambulatory Visit: Payer: 59 | Admitting: Internal Medicine

## 2014-02-03 ENCOUNTER — Ambulatory Visit (INDEPENDENT_AMBULATORY_CARE_PROVIDER_SITE_OTHER): Payer: 59 | Admitting: Internal Medicine

## 2014-02-03 ENCOUNTER — Encounter: Payer: Self-pay | Admitting: Internal Medicine

## 2014-02-03 VITALS — BP 110/60 | HR 89 | Temp 98.0°F | Resp 20 | Ht 71.5 in | Wt 298.0 lb

## 2014-02-03 DIAGNOSIS — E119 Type 2 diabetes mellitus without complications: Secondary | ICD-10-CM

## 2014-02-03 DIAGNOSIS — I10 Essential (primary) hypertension: Secondary | ICD-10-CM

## 2014-02-03 MED ORDER — INSULIN ASPART 100 UNIT/ML FLEXPEN: PEN_INJECTOR | SUBCUTANEOUS | Status: DC

## 2014-02-03 MED ORDER — INSULIN GLARGINE 300 UNIT/ML ~~LOC~~ SOPN: 20.0000 [IU] | PEN_INJECTOR | SUBCUTANEOUS | Status: DC

## 2014-02-03 MED ORDER — METHADONE HCL 10 MG PO TABS: ORAL_TABLET | ORAL | Status: DC

## 2014-02-03 NOTE — Progress Notes (Signed)
   Subjective:    Patient ID: [name], male    DOB: 10-Jul-1955, 59 y.o.   MRN: [id number]  HPI    Lab Results  Component Value Date   HGBA1C 9.5* 01/02/2014    Wt Readings from Last 3 Encounters:  02/03/14 298 lb (135.172 kg)  01/02/14 288 lb (130.636 kg)  09/23/13 291 lb (131.997 kg)    Review of Systems     Objective:   Physical Exam        Assessment & Plan:

## 2014-02-03 NOTE — Progress Notes (Signed)
Subjective:    Patient ID: [name], male    DOB: [birth year], 59 y.o.   MRN: [id number]  HPI 59 year old patient who is seen today for follow-up of type 2 diabetes.  This remains poorly controlled in spite of maximum oral medications.  He does not tolerate Victoza well and is on a submaximal dose and often skips medication due to nausea. He states he eats 3 meals a day with frequent snacks.  He admits to dietary noncompliance.  His weight is up 10 pounds since his last visit here  He has had a recent eye examination Scheduled for follow-up colonoscopy next month  He states that he has had numbness of his lower extremities since the early nineties when he had back surgery  Past Medical History  Diagnosis Date  . History of hip replacement, total   . Diabetes mellitus   . Hypertension   . Hyperlipidemia   . GERD (gastroesophageal reflux disease)   . Obesity   . Chronic pain   . History of colonic polyps   . RBBB (right bundle branch block with left anterior fascicular block)   . 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 Left anterior hemiblock   . Adenomatous polyp     History   Social History  . Marital Status: Married    Spouse Name: N/A    Number of Children: N/A  . Years of Education: N/A   Occupational History  . Not on file.   Social History Main Topics  . Smoking status: Former Smoker    Quit date: 01/04/2007  . Smokeless tobacco: Not on file  . Alcohol Use: No     Comment: recovering alcoholic  . Drug Use: Not on file  . Sexual Activity: Not on file   Other Topics Concern  . Not on file   Social History Narrative   Recovering alcoholic      Minister     Past Surgical History  Procedure Laterality Date  . Hip surgery  12/29/09    R THR  ([facility])  . Appendectomy    . Foot surgery    . Cervical laminectomy    . Cervical fusion    . Hip surgery      bilateral  . Total knee arthroplasty  01/2008    left knee  . Total hip  arthroplasty Left 09/10/2012    Procedure: REMOVAL OF HARDWARE LEFT PROXIMAL THIGH/FEMUR AND LEFT TOTAL HIP ARTHROPLASTY;  Surgeon: Jessy Oto, MD;  Location: [facility];  Service: Orthopedics;  Laterality: Left;  . Hardware removal Left 09/10/2012    Procedure: HARDWARE REMOVAL;  Surgeon: Jessy Oto, MD;  Location: [facility];  Service: Orthopedics;  Laterality: Left;    Family History  Problem Relation Age of Onset  . Cancer Mother     lung  . Crohn's disease Father   . Alcohol abuse Brother     Allergies  Allergen Reactions  . Aspirin     REACTION: unspecified  . Codeine     REACTION: itch  . Morphine And Related Nausea And Vomiting  . Naproxen     REACTION: anaphylaxis    Current Outpatient Prescriptions on File Prior to Visit  Medication Sig Dispense Refill  . Liraglutide 18 MG/3ML SOPN Inject 1.8 mg into the skin daily. 20 pen 1  . atorvastatin (LIPITOR) 40 MG tablet Take 1 tablet by mouth  daily 90 tablet 1  . diclofenac sodium (VOLTAREN)  1 % GEL Apply topically.      Marland Kitchen glimepiride (AMARYL) 4 MG tablet Take 1 tablet by mouth  daily as needed 90 tablet 1  . glucose blood test strip 1 each by Other route daily as needed for other. Use as instructed 100 each 12  . HYDROcodone-acetaminophen (NORCO) 7.5-325 MG per tablet Take 1-2 tablets by mouth every 4 (four) hours as needed for pain (maximum of 8 total pain medication and tylenol tablets per day.). 60 tablet 1  . Insulin Pen Needle (NOVOTWIST) 32G X 5 MM MISC 1 each by Does not apply route as directed. 100 each 3  . lidocaine (LIDODERM) 5 % Place 1 patch onto the skin as needed.     Marland Kitchen lisinopril-hydrochlorothiazide (PRINZIDE,ZESTORETIC) 20-25 MG per tablet Take 1 tablet by mouth  daily 90 tablet 1  . metFORMIN (GLUCOPHAGE) 1000 MG tablet Take 1 tablet by mouth  twice a day with a meal 180 tablet 1  . methadone (DOLOPHINE) 10 MG tablet Take one to four tablets daily as needed 90 tablet 0  . methocarbamol (ROBAXIN) 750 MG tablet Take  1 tablet by mouth  every 6 hours as needed 90 tablet 1  . Multiple Vitamins-Minerals (MULTIVITAMIN WITH MINERALS) tablet Take 1 tablet by mouth daily.    Marland Kitchen omeprazole (PRILOSEC) 20 MG capsule Take 1 capsule (20 mg total) by mouth daily. 90 capsule 3  . pioglitazone (ACTOS) 45 MG tablet Take 1 tablet by mouth  daily 90 tablet 1  . promethazine (PHENERGAN) 25 MG tablet Take 1 tablet by mouth  every 6 hours as needed for nausea or vomiting 90 tablet 0  . Sildenafil Citrate (VIAGRA PO) Take 1 tablet by mouth as needed.     No current facility-administered medications on file prior to visit.    BP 110/60 mmHg  Pulse 89  Temp(Src) 98 F (36.7 C) (Oral)  Resp 20  Ht 5' 11.5" (1.816 m)  Wt 298 lb (135.172 kg)  BMI 40.99 kg/m2  SpO2 98%      Review of Systems  Constitutional: Positive for unexpected weight change. Negative for fever, chills, appetite change and fatigue.  HENT: Negative for congestion, dental problem, ear pain, hearing loss, sore throat, tinnitus, trouble swallowing and voice change.   Eyes: Negative for pain, discharge and visual disturbance.  Respiratory: Negative for cough, chest tightness, wheezing and stridor.   Cardiovascular: Negative for chest pain, palpitations and leg swelling.  Gastrointestinal: Negative for nausea, vomiting, abdominal pain, diarrhea, constipation, blood in stool and abdominal distention.  Genitourinary: Negative for urgency, hematuria, flank pain, discharge, difficulty urinating and genital sores.  Musculoskeletal: Positive for back pain. Negative for myalgias, joint swelling, arthralgias, gait problem and neck stiffness.  Skin: Negative for rash.  Neurological: Negative for dizziness, syncope, speech difficulty, weakness, numbness and headaches.  Hematological: Negative for adenopathy. Does not bruise/bleed easily.  Psychiatric/Behavioral: Negative for behavioral problems and dysphoric mood. The patient is not nervous/anxious.          Objective:   Physical Exam  Constitutional:  Morbidly obese.  Blood pressure low normal          Assessment & Plan:    Diabetes, poor control.  Will start on basal bolus insulin.  Samples provided.  Insulin injection technique demonstrated in the office Hypertension, well-controlled Chronic low back pain.  Methadone refilled  Weight loss encouraged Recheck one month  Greater than 30 minutes was spent discussing diabetes and insulin therapy and insulin injection technique

## 2014-02-03 NOTE — Addendum Note (Signed)
Addended by: Colleen Can on: 02/03/2014 10:58 AM   Modules accepted: Orders

## 2014-02-03 NOTE — Progress Notes (Signed)
Pre visit review using our clinic review tool, if applicable. No additional management support is needed unless otherwise documented below in the visit note. 

## 2014-02-03 NOTE — Patient Instructions (Addendum)
Return in one month for follow-up  Basic Carbohydrate Counting for Diabetes Mellitus Carbohydrate counting is a method for keeping track of the amount of carbohydrates you eat. Eating carbohydrates naturally increases the level of sugar (glucose) in your blood, so it is important for you to know the amount that is okay for you to have in every meal. Carbohydrate counting helps keep the level of glucose in your blood within normal limits. The amount of carbohydrates allowed is different for every person. A dietitian can help you calculate the amount that is right for you. Once you know the amount of carbohydrates you can have, you can count the carbohydrates in the foods you want to eat. Carbohydrates are found in the following foods:  Grains, such as breads and cereals.  Dried beans and soy products.  Starchy vegetables, such as potatoes, peas, and corn.  Fruit and fruit juices.  Milk and yogurt.  Sweets and snack foods, such as cake, cookies, candy, chips, soft drinks, and fruit drinks. CARBOHYDRATE COUNTING There are two ways to count the carbohydrates in your food. You can use either of the methods or a combination of both. Reading the "Nutrition Facts" on [place] The "Nutrition Facts" is an area that is included on the labels of almost all packaged food and beverages in the [place]. It includes the serving size of that food or beverage and information about the nutrients in each serving of the food, including the grams (g) of carbohydrate per serving.  Decide the number of servings of this food or beverage that you will be able to eat or drink. Multiply that number of servings by the number of grams of carbohydrate that is listed on the label for that serving. The total will be the amount of carbohydrates you will be having when you eat or drink this food or beverage. Learning Standard Serving Sizes of Food When you eat food that is not packaged or does not include "Nutrition  Facts" on the label, you need to measure the servings in order to count the amount of carbohydrates.A serving of most carbohydrate-rich foods contains about 15 g of carbohydrates. The following list includes serving sizes of carbohydrate-rich foods that provide 15 g ofcarbohydrate per serving:   1 slice of bread (1 oz) or 1 six-inch tortilla.    of a hamburger bun or English muffin.  4-6 crackers.   cup unsweetened dry cereal.    cup hot cereal.   cup rice or pasta.    cup mashed potatoes or  of a large baked potato.  1 cup fresh fruit or one small piece of fruit.    cup canned or frozen fruit or fruit juice.  1 cup milk.   cup plain fat-free yogurt or yogurt sweetened with artificial sweeteners.   cup cooked dried beans or starchy vegetable, such as peas, corn, or potatoes.  Decide the number of standard-size servings that you will eat. Multiply that number of servings by 15 (the grams of carbohydrates in that serving). For example, if you eat 2 cups of strawberries, you will have eaten 2 servings and 30 g of carbohydrates (2 servings x 15 g = 30 g). For foods such as soups and casseroles, in which more than one food is mixed in, you will need to count the carbohydrates in each food that is included. EXAMPLE OF CARBOHYDRATE COUNTING Sample Dinner  3 oz chicken breast.   cup of brown rice.   cup of corn.  1 cup  milk.   1 cup strawberries with sugar-free whipped topping.  Carbohydrate Calculation Step 1: Identify the foods that contain carbohydrates:   Rice.   Corn.   Milk.   Strawberries. Step 2:Calculate the number of servings eaten of each:   2 servings of rice.   1 serving of corn.   1 serving of milk.   1 serving of strawberries. Step 3: Multiply each of those number of servings by 15 g:   2 servings of rice x 15 g = 30 g.   1 serving of corn x 15 g = 15 g.   1 serving of milk x 15 g = 15 g.   1 serving of  strawberries x 15 g = 15 g. Step 4: Add together all of the amounts to find the total grams of carbohydrates eaten: 30 g + 15 g + 15 g + 15 g = 75 g. Document Released: 12/20/2004 Document Revised: 05/06/2013 Document Reviewed: 11/16/2012 [facility] Patient Information 2015 [place], [place]. This information is not intended to replace advice given to you by your health care provider. Make sure you discuss any questions you have with your health care provider. Diabetes and Exercise Exercising regularly is important. It is not just about losing weight. It has many health benefits, such as:  Improving your overall fitness, flexibility, and endurance.  Increasing your bone density.  Helping with weight control.  Decreasing your body fat.  Increasing your muscle strength.  Reducing stress and tension.  Improving your overall health. People with diabetes who exercise gain additional benefits because exercise:  Reduces appetite.  Improves the body's use of blood sugar (glucose).  Helps lower or control blood glucose.  Decreases blood pressure.  Helps control blood lipids (such as cholesterol and triglycerides).  Improves the body's use of the hormone insulin by:  Increasing the body's insulin sensitivity.  Reducing the body's insulin needs.  Decreases the risk for heart disease because exercising:  Lowers cholesterol and triglycerides levels.  Increases the levels of good cholesterol (such as high-density lipoproteins [HDL]) in the body.  Lowers blood glucose levels. YOUR ACTIVITY PLAN  Choose an activity that you enjoy and set realistic goals. Your health care provider or diabetes educator can help you make an activity plan that works for you. Exercise regularly as directed by your health care provider. This includes:  Performing resistance training twice a week such as push-ups, sit-ups, lifting weights, or using resistance bands.  Performing 150 minutes of cardio exercises  each week such as walking, running, or playing sports.  Staying active and spending no more than 90 minutes at one time being inactive. Even short bursts of exercise are good for you. Three 10-minute sessions spread throughout the day are just as beneficial as a single 30-minute session. Some exercise ideas include:  Taking the dog for a walk.  Taking the stairs instead of the elevator.  Dancing to your favorite song.  Doing an exercise video.  Doing your favorite exercise with a friend. RECOMMENDATIONS FOR EXERCISING WITH TYPE 1 OR TYPE 2 DIABETES   Check your blood glucose before exercising. If blood glucose levels are greater than 240 mg/dL, check for urine ketones. Do not exercise if ketones are present.  Avoid injecting insulin into areas of the body that are going to be exercised. For example, avoid injecting insulin into:  The arms when playing tennis.  The legs when jogging.  Keep a record of:  Food intake before and after you exercise.  Expected  peak times of insulin action.  Blood glucose levels before and after you exercise.  The type and amount of exercise you have done.  Review your records with your health care provider. Your health care provider will help you to develop guidelines for adjusting food intake and insulin amounts before and after exercising.  If you take insulin or oral hypoglycemic agents, watch for signs and symptoms of hypoglycemia. They include:  Dizziness.  Shaking.  Sweating.  Chills.  Confusion.  Drink plenty of water while you exercise to prevent dehydration or heat stroke. Body water is lost during exercise and must be replaced.  Talk to your health care provider before starting an exercise program to make sure it is safe for you. Remember, almost any type of activity is better than none. Document Released: 03/12/2003 Document Revised: 05/06/2013 Document Reviewed: 05/29/2012 [facility] Patient Information 2015 [place], [place].  This information is not intended to replace advice given to you by your health care provider. Make sure you discuss any questions you have with your health care provider. Diabetes and Standards of Medical Care Diabetes is complicated. You may find that your diabetes team includes a dietitian, nurse, diabetes educator, eye doctor, and more. To help everyone know what is going on and to help you get the care you deserve, the following schedule of care was developed to help keep you on track. Below are the tests, exams, vaccines, medicines, education, and plans you will need. HbA1c test This test shows how well you have controlled your glucose over the past 2-3 months. It is used to see if your diabetes management plan needs to be adjusted.   It is performed at least 2 times a year if you are meeting treatment goals.  It is performed 4 times a year if therapy has changed or if you are not meeting treatment goals. Blood pressure test  This test is performed at every routine medical visit. The goal is less than 140/90 mm Hg for most people, but 130/80 mm Hg in some cases. Ask your health care provider about your goal. Dental exam  Follow up with the dentist regularly. Eye exam  If you are diagnosed with type 1 diabetes as a child, get an exam upon reaching the age of 7 years or older and have had diabetes for 3-5 years. Yearly eye exams are recommended after that initial eye exam.  If you are diagnosed with type 1 diabetes as an adult, get an exam within 5 years of diagnosis and then yearly.  If you are diagnosed with type 2 diabetes, get an exam as soon as possible after the diagnosis and then yearly. Foot care exam  Visual foot exams are performed at every routine medical visit. The exams check for cuts, injuries, or other problems with the feet.  A comprehensive foot exam should be done yearly. This includes visual inspection as well as assessing foot pulses and testing for loss of  sensation.  Check your feet nightly for cuts, injuries, or other problems with your feet. Tell your health care provider if anything is not healing. Kidney function test (urine microalbumin)  This test is performed once a year.  Type 1 diabetes: The first test is performed 5 years after diagnosis.  Type 2 diabetes: The first test is performed at the time of diagnosis.  A serum creatinine and estimated glomerular filtration rate (eGFR) test is done once a year to assess the level of chronic kidney disease (CKD), if present. Lipid profile (cholesterol,  HDL, LDL, triglycerides)  Performed every 5 years for most people.  The goal for LDL is less than 100 mg/dL. If you are at high risk, the goal is less than 70 mg/dL.  The goal for HDL is 40 mg/dL-50 mg/dL for men and 50 mg/dL-60 mg/dL for women. An HDL cholesterol of 60 mg/dL or higher gives some protection against heart disease.  The goal for triglycerides is less than 150 mg/dL. Influenza vaccine, pneumococcal vaccine, and hepatitis B vaccine  The influenza vaccine is recommended yearly.  It is recommended that people with diabetes who are over 61 years old get the pneumonia vaccine. In some cases, two separate shots may be given. Ask your health care provider if your pneumonia vaccination is up to date.  The hepatitis B vaccine is also recommended for adults with diabetes. Diabetes self-management education  Education is recommended at diagnosis and ongoing as needed. Treatment plan  Your treatment plan is reviewed at every medical visit. Document Released: 10/17/2008 Document Revised: 05/06/2013 Document Reviewed: 05/22/2012 [facility] Patient Information 2015 [place], [place]. This information is not intended to replace advice given to you by your health care provider. Make sure you discuss any questions you have with your health care provider. How and Where to Give Subcutaneous Insulin Injections, Adult People with type 1 diabetes  must take insulin since their bodies do not make it. People with type 2 diabetes may require insulin. There are many different types of insulin as well as other injectable diabetes medicines that are meant to be injected into the fat layer under your skin. The type of insulin or injectable diabetes medicine you take may determine how many injections you give yourself and when to take the injections.  CHOOSING A SITE FOR INJECTION Insulin absorption varies from site to site. As with any injectable medication it is best for the insulin to be injected within the same body region. However, do not inject the insulin in the same spot each time. Rotating the spots you give your injections will prevent inflammation or tissue breakdown. There are four main regions that can be used for injections. The regions include the:  Abdomen (preferred region, especially for non-insulin injectable diabetes medicine).  Front and upper outer sides of thighs.  Back of upper arm.  Buttocks. USING A SYRINGE AND VIAL Drawing up insulin: single insulin dose  Wash your hands with soap and water.  Gently roll the insulin bottle (vial) between your hands to mix it. Do not shake the vial.  Clean the top rubber part of the vial with an alcohol wipe. Be sure that the plastic pop-top has been removed on newer vials.  Remove the plastic cover from the needle on the syringe. Do not let the needle touch anything.  Pull the plunger back to draw air into the syringe. The air should be the same amount as the insulin dose.  Push the needle through the rubber on the top of the vial. Do not turn the vial over.  Push the plunger in all the way to put the air into the vial.  Leave the needle in the vial and turn the vial and syringe upside down.  Pull down slowly on the plunger, drawing the amount of insulin you need into the syringe.  Look for air bubbles in the syringe. You may need to push the plunger up and down 2 to 3 times  to slowly get rid of any air bubbles in the syringe.  Pull back the plunger to  get your correct dose.  Remove the needle from the vial.  Use an alcohol wipe to clean the area of the body to be injected.  Pinch up 1 inch of skin and hold it.  Put the needle straight into the skin (90-degree angle). Put the needle in as far as it will go (to the hub). The needle may need to be injected at a 45-degree angle in small adults with little fat.  When the needle is in, you can let go of your skin.  Push the plunger down all the way to inject the insulin.  Pull the needle straight out of the skin.  Press the alcohol wipe over the spot where you gave your injection. Keep it there for a few seconds. Do not rub the area.  Do not put the plastic cover back on the needle. Drawing up insulin: mixing 2 insulins  Wash your hands with soap and water.  Gently roll the vial of "cloudy" insulin between your hands or rotate the vial from top to bottom to mix.  Clean the top of both vials with an alcohol wipe. Be sure that the plastic pop-top lid has been removed on newer vials.  Pull air into the syringe to equal the dose of "cloudy" insulin.  Stick the needle into the "cloudy" insulin vial and inject the air. Be sure to keep the vial upright.  Remove the needle from the "cloudy" insulin vial.  Pull air into the syringe to equal the dose of "clear" insulin.  Stick the needle into the "clear" insulin vial and inject the air.  Leave the needle in the "clear" insulin vial and turn the vial upside down.  Pull down on the plunger and slowly draw into the syringe the number of units of "clear" insulin desired.  Look for air bubbles in the syringe. You may need to push the plunger up and down 2 to 3 times to slowly get rid of any air bubbles in the syringe.  Remove the needle from the "clear" insulin vial.  Stick the needle into the "cloudy" insulin vial. Do not inject any of the "clear" insulin  into the "cloudy" vial.  Turn the "cloudy" vial upside down and pull the plunger down to the number of units that equals the total number of units of "clear" and "cloudy" insulins.  Remove the needle from the "cloudy" insulin vial.  Use an alcohol wipe to clean the area of the body to be injected.  Put the needle straight into the skin (90-degree angle). Put the needle in as far as it will go (to the hub). The needle may need to be injected at a 45-degree angle in small adults with little fat.  When the needle is in, you can let go of your skin.  Push the plunger down all the way to inject the insulin.  Pull the needle straight out of the skin.  Press the alcohol wipe over the spot where you gave your injection. Keep it there for a few seconds. Do not rub the area.  Do not put the plastic cover back on the needle. USING INSULIN PENS  Wash your hands with soap and water.  If you are using the "cloudy" insulin, roll the pen between your palms several times or rotate the pen top to bottom several times.  Remove the insulin pen cap.  Clean the rubber stopper of the cartridge with an alcohol wipe.  Remove the protective paper tab from the disposable needle.  Screw the needle onto the pen.  Remove the outer plastic needle cover.  Remove the inner plastic needle cover.  Prime the insulin pen by turning the button (dial) to 2 units. Hold the pen with the needle pointing up, and push the dial on the opposite end until a drop of insulin appears at the needle tip. If no insulin appears, repeat this step.  Dial the number of units of insulin you will inject.  Use an alcohol wipe to clean the area of the body to be injected.  Pinch up 1 inch of skin and hold it.  Put the needle straight into the skin (90-degree angle).  Push the dial down to push the insulin into the fat tissue.  Count to 10 slowly. Then, remove the needle from the fat tissue.  Carefully replace the larger outer  plastic needle cover over the needle and unscrew the capped needle. THROWING AWAY SUPPLIES  Discard used needles in a puncture proof sharps disposal container. Follow disposal regulations for the area where you live.  Vials and empty disposable pens may be thrown away in the regular trash. Document Released: 03/12/2003 Document Revised: 05/06/2013 Document Reviewed: 05/29/2012 [facility] Patient Information 2015 [place], [place]. This information is not intended to replace advice given to you by your health care provider. Make sure you discuss any questions you have with your health care provider. Insulin Treatment for Diabetes Diabetes is a disease that does not go away (chronic). It occurs when the body does not properly use the sugar (glucose) that is released from food after it is digested. Glucose levels are controlled by a hormone called insulin, which is made by your pancreas. Depending on the type of diabetes you have, either of the following will apply:   The pancreas does not make any insulin (type 1 diabetes).  The pancreas makes too little insulin, and the body cannot respond normally to the insulin that is made (type 2 diabetes). Without insulin, death can occur. However, with the addition of insulin, blood sugar monitoring, and treatment, someone with diabetes can live a full and productive life. This document will discuss the role of insulin in your treatment and provide information about its use.  HOW IS INSULIN GIVEN? Insulin is a medicine that can only be given by injection. Taking it by mouth makes it inactive because of the acid in your stomach. Insulin is injected under the skin by a syringe and needle, an insulin pen, a pump, or a jet injector. Your dose will be determined by your health care provider based on your individual needs. You will also be given guidance on which method of giving insulin is right for you. Remember that if you give insulin with a needle and syringe, you  must do so using only a special insulin syringe made for this purpose. WHERE ON THE BODY SHOULD INSULIN BE INJECTED? Insulin is injected into the fatty layer of tissue just under your skin. Good places to inject insulin include the upper arm, the front and outer area of the thigh, the hips, and the abdomen. Giving your insulin in the abdomen is preferred because this provides the most rapid and consistent absorption. Avoid the area 2 inches (5 cm) around the navel and avoid injecting into areas on your body with scar tissue. In addition, it is important to rotate your injection sites with every shot to prevent irritation and improve absorption. WHAT ARE THE DIFFERENT TYPES OF INSULIN?  If you have type 1 diabetes, you  must take insulin to stay alive. Your body does not produce it. If you have type 2 diabetes, you might require insulin in addition to, or instead of, other medicines. In either case, proper use of insulin is critical to control your diabetes.  There are a number of different types of insulin. Usually, you will give yourself injections, though others can be trained to give them to you. Some people have an insulin pump that delivers insulin continuously through a tube (cannula) that is placed under the skin. Using insulin requires that you check your blood sugar several times a day. The exact number of times and time of day to check will vary depending on your type of diabetes, your type of insulin, and treatment goals. Your health care provider will direct you.  Generally, different insulins have different properties. The following is a general guide. Specifics will vary by product, and new products are introduced periodically.   Rapid-acting insulin starts working quickly (in as little as 5 minutes) and wears off in 4 to 6 hours (sometimes longer). This type of insulin works well when taken just before a meal to bring your blood sugar quickly back to normal.   Short-acting insulin starts  working in about 30 minutes and can last 6 to 10 hours. This type of insulin should be taken about 30 minutes before you start eating a meal.  Intermediate-acting insulin starts working in 1-2 hours and wears off after about 10 to 18 hours. This insulin will lower your blood sugar for a longer period of time, but it will not be as effective in lowering your blood sugar right after a meal.   Long-acting insulin mimics the small amount of insulin that your pancreas usually produces throughout the day. You need to have some insulin present at all times. It is crucial to the metabolism of brain cells and other cells. Long-acting insulin is meant to be used either once or twice a day. It is usually used in combination with other types of insulin, or in combination with other diabetes medicines.  Discuss the type of insulin you are taking with your health care provider or pharmacist. You will then be aware of when the insulin can be expected to peak and when it will wear off. This is important to know so you can plan for meal times and periods of exercise.  Your health care provider will usually have a strategy in mind when treating you with insulin. This will vary with your type of diabetes, your diabetes treatment goals, and your health history. It is important that you understand this strategy so you can be an active partner in treating your diabetes. Here are some terms you might hear:   Basal insulin. This refers to the small amount of insulin that needs to be present in your blood at all times. Sometimes oral medicines will be enough. For other people, and especially for people with type 1 diabetes, insulin is needed. Usually, intermediate-acting or long-acting insulin is used once or twice a day to accomplish this.   Prandial (meal-related) insulin. Your blood sugar will rise rapidly after a meal. Rapid-acting or short-acting insulin can be used right before the meal to bring your blood sugar back to  normal quickly. You might be instructed to adjust the amount of insulin depending on how much carbohydrate (starch) is in your meal.   Corrective insulin. You might be instructed to check your blood sugar at certain times of the day. You then might  use a small amount of rapid-acting or short-acting insulin to bring the blood sugar down to normal if it is elevated.   Tight control (also called intensive therapy). Tight control means keeping your blood sugar as close to your target as possible and keeping it from going too high after meals. People with tight control of their diabetes are shown to have fewer long-term problems from their diabetes.   Glycohemoglobin (also called glyco, glycosylated hemoglobin, hemoglobin A1c, or A1c) level. This measures how well your blood sugar has been controlled during the past 1 to 3 months. It helps your health care provider see how effective your treatment is and decide if any changes are needed. Your health care provider will discuss your target glycohemoglobin level with you.  Insulin treatment requires your careful attention. Treatment plans will be different for different people. Some people do well with a simple program. Others require more complicated programs, with multiple insulin injections daily. You will work with your health care provider to develop the best program for you. Regardless of your insulin treatment plan, you must also do your best on weight control, diet and food choices, exercise, blood pressure control, cholesterol control, and stress levels.  WHAT ARE THE SIDE EFFECTS OF INSULIN? Although insulin treatment is important, it does have some side effects, such as:   Insulin can cause your blood sugar to go too low (hypoglycemia).   Weight gain can occur.   Improper injection technique can cause hypoglycemia, blood sugar to go too high (hyperglycemia), skin injury or irritation, or other problems. You must learn to inject insulin  properly. Document Released: 03/18/2008 Document Revised: 05/06/2013 Document Reviewed: 06/03/2012 [facility] Patient Information 2015 [place], [place]. This information is not intended to replace advice given to you by your health care provider. Make sure you discuss any questions you have with your health care provider. Diabetes, Eating Away From Home Sometimes, you might eat in a restaurant or have meals that are prepared by someone else. You can enjoy eating out. However, the portions in restaurants may be much larger than needed. Listed below are some ideas to help you choose foods that will keep your blood glucose (sugar) in better control.  TIPS FOR EATING OUT  Know your meal plan and how many carbohydrate servings you should have at each meal. You may wish to carry a copy of your meal plan in your purse or wallet. Learn the foods included in each food group.  Make a list of restaurants near you that offer healthy choices. Take a copy of the carry-out menus to see what they offer. Then, you can plan what you will order ahead of time.  Become familiar with serving sizes by practicing them at home using measuring cups and spoons. Once you learn to recognize portion sizes, you will be able to correctly estimate the amount of total carbohydrate you are allowed to eat at the restaurant. Ask for a takeout box if the portion is more than you should have. When your food comes, leave the amount you should have on the plate, and put the rest in the takeout box before you start eating.  Plan ahead if your mealtime will be different from usual. Check with your caregiver to find out how to time meals and medicine if you are taking insulin.  Avoid high-fat foods, such as fried foods, cream sauces, high-fat salad dressings, or any added butter or margarine.  Do not be afraid to ask questions. Ask your server about the  portion size, cooking methods, ingredients and if items can be substituted. Restaurants do  not list all available items on the menu. You can ask for your main entree to be prepared using skim milk, oil instead of butter or margarine, and without gravy or sauces. Ask your waiter or waitress to serve salad dressings, gravy, sauces, margarine, and sour cream on the side. You can then add the amount your meal plan suggests.  Add more vegetables whenever possible.  Avoid items that are labeled "jumbo," "giant," "deluxe," or "supersized."  You may want to split an entre with someone and order an extra side salad.  Watch for hidden calories in foods like croutons, bacon, or cheese.  Ask your server to take away the bread basket or chips from your table.  Order a dinner salad as an appetizer. You can eat most foods served in a restaurant. Some foods are better choices than others. Breads and Starches  Recommended: All kinds of bread (wheat, rye, white, oatmeal, [place], [place], raisin), hard or soft dinner rolls, frankfurter or hamburger buns, small bagels, small corn or whole-wheat flour tortillas.  Avoid: Frosted or glazed breads, butter rolls, egg or cheese breads, croissants, sweet rolls, pastries, coffee cake, glazed or frosted doughnuts, muffins. Crackers  Recommended: Animal crackers, graham, rye, saltine, oyster, and matzoth crackers. Bread sticks, melba toast, rusks, pretzels, popcorn (without fat), zwieback toast.  Avoid: High-fat snack crackers or chips. Buttered popcorn. Cereals  Recommended: Hot and cold cereals. Whole grains such as oatmeal or shredded wheat are good choices.  Avoid: Sugar-coated or granola type cereals. Potatoes/Pasta/Rice/Beans  Recommended: Order baked, boiled, or mashed potatoes, rice or noodles without added fat, whole beans. Order gravies, butter, margarine, or sauces on the side so you can control the amount you add.  Avoid: Hash browns or fried potatoes. Potatoes, pasta, or rice prepared with cream or cheese sauce. Potato or pasta salads  prepared with large amounts of dressing. Fried beans or fried rice. Vegetables  Recommended: Order steamed, baked, boiled, or stewed vegetables without sauces or extra fat. Ask that sauce be served on the side. If vegetables are not listed on the menu, ask what is available.  Avoid: Vegetables prepared with cream, butter, or cheese sauce. Fried vegetables. Salad Bars  Recommended: Many of the vegetables at a salad bar are considered "free." Use lemon juice, vinegar, or low-calorie salad dressing (fewer than 20 calories per serving) as "free" dressings for your salad. Look for salad bar ingredients that have no added fat or sugar such as tomatoes, lettuce, cucumbers, broccoli, carrots, onions, and mushrooms.  Avoid: Prepared salads with large amounts of dressing, such as coleslaw, caesar salad, macaroni salad, bean salad, or carrot salad. Fruit  Recommended: Eat fresh fruit or fresh fruit salad without added dressing. A salad bar often offers fresh fruit choices, but canned fruit at a restaurant is usually packed in sugar or syrup.  Avoid: Sweetened canned or frozen fruits, plain or sweetened fruit juice. Fruit salads with dressing, sour cream, or sugar added to them. Meat and Meat Substitutes  Recommended: Order broiled, baked, roasted, or grilled meat, poultry, or fish. Trim off all visible fat. Do not eat the skin of poultry. The size stated on the menu is the raw weight. Meat shrinks by  in cooking (for example, 4 oz raw equals 3 oz cooked meat).  Avoid: Deep-fat fried meat, poultry, or fish. Breaded meats. Eggs  Recommended: Order soft, hard-cooked, poached, or scrambled eggs. Omelets may be okay, depending on  what ingredients are added. Egg substitutes are also a good choice.  Avoid: Fried eggs, eggs prepared with cream or cheese sauce. Milk  Recommended: Order low-fat or fat-free milk according to your meal plan. Plain, nonfat yogurt or flavored yogurt with no sugar added may be  used as a substitute for milk. Soy milk may also be used.  Avoid: Milk shakes or sweetened milk beverages. Soups and Combination Foods  Recommended: Clear broth or consomm are "free" foods and may be used as an appetizer. Broth-based soups with fat removed count as a starch serving and are preferred over cream soups. Soups made with beans or split peas may be eaten but count as a starch.  Avoid: Fatty soups, soup made with cream, cheese soup. Combination foods prepared with excessive amounts of fat or with cream or cheese sauces. Desserts and Sweets  Recommended: Ask for fresh fruit. Sponge or angel food cake without icing, ice milk, no sugar added ice cream, sherbet, or frozen yogurt may fit into your meal plan occasionally.  Avoid: Pastries, puddings, pies, cakes with icing, custard, gelatin desserts. Fats and Oils  Recommended: Choose healthy fats such as olive oil, canola oil, or tub margarine, reduced fat or fat-free sour cream, cream cheese, avocado, or nuts.  Avoid: Any fats in excess of your allowed portion. Deep-fried foods or any food with a large amount of fat. Note: Ask for all fats to be served on the side, and limit your portion sizes according to your meal plan. Document Released: 12/20/2004 Document Revised: 03/14/2011 Document Reviewed: 03/19/2013 [facility] Patient Information 2015 [place], [place]. This information is not intended to replace advice given to you by your health care provider. Make sure you discuss any questions you have with your health care provider.

## 2014-02-05 ENCOUNTER — Telehealth: Payer: Self-pay | Admitting: Internal Medicine

## 2014-02-05 MED ORDER — INSULIN LISPRO 100 UNIT/ML (KWIKPEN): PEN_INJECTOR | SUBCUTANEOUS | Status: DC

## 2014-02-05 NOTE — Telephone Encounter (Signed)
Okay to change to Humalog Kwikpen per Dr.K.

## 2014-02-05 NOTE — Telephone Encounter (Signed)
PA for Novolog was denied.  Humalog is the preferred medication on the patient's plan.

## 2014-02-11 ENCOUNTER — Telehealth: Payer: Self-pay

## 2014-02-11 MED ORDER — GLUCOSE BLOOD VI STRP: ORAL_STRIP | Status: DC

## 2014-02-11 NOTE — Telephone Encounter (Signed)
[facility] refill requests for [facility] and T/S ONE TOUCH ULTRA BLUE

## 2014-02-11 NOTE — Telephone Encounter (Signed)
Rx for test strips sent to [facility]. Pt is not on Levemir.

## 2014-02-13 ENCOUNTER — Other Ambulatory Visit: Payer: Self-pay

## 2014-02-13 NOTE — Telephone Encounter (Signed)
[facility] sent a refill request for Levemir.  Per the pt's chart he is not currently on this medication. Refill request denied.

## 2014-02-24 ENCOUNTER — Telehealth: Payer: Self-pay | Admitting: Internal Medicine

## 2014-02-24 MED ORDER — INSULIN LISPRO 100 UNIT/ML (KWIKPEN): PEN_INJECTOR | SUBCUTANEOUS | Status: DC

## 2014-02-24 MED ORDER — INSULIN GLARGINE 300 UNIT/ML ~~LOC~~ SOPN: 20.0000 [IU] | PEN_INJECTOR | SUBCUTANEOUS | Status: DC

## 2014-02-24 NOTE — Telephone Encounter (Signed)
Pt has been taking his insulin incorrectly. He has not been taking 6 additional units prior to supper b/c he did not read the instructions. Need to talk with you about his refills and dosing please.

## 2014-02-24 NOTE — Telephone Encounter (Signed)
Spoke to pt, told him to do Humalog mealtime Insulin as prescribed and instructed pt on how to titrate Toujeo as per Dr.K's instructions as below. Pt verbalized understanding. Rx's sent to pharmacy.

## 2014-02-24 NOTE — Telephone Encounter (Signed)
Spoke to pt, stated he has messed up his insulin dosing has been titrating up his mealtime insulin not Toujeo and is up to 32 units prior to each meal and is almost out of insulin. Told pt let me discuss with Dr. Raliegh Ip what he wants you to do and get back to you. Pt verbalized understanding.

## 2014-02-24 NOTE — Telephone Encounter (Signed)
Discussed with Dr. Raliegh Ip, he said for pt to do Mealtime insulin as prescribed and do the Toujeo 20 units after supper and increase by four units every 4 days until fasting blood sugar is less than 150.

## 2014-02-25 ENCOUNTER — Ambulatory Visit (AMBULATORY_SURGERY_CENTER): Payer: Self-pay | Admitting: *Deleted

## 2014-02-25 VITALS — Ht 72.0 in | Wt 304.2 lb

## 2014-02-25 DIAGNOSIS — Z86010 Personal history of colonic polyps: Secondary | ICD-10-CM

## 2014-02-25 MED ORDER — NA SULFATE-K SULFATE-MG SULF 17.5-3.13-1.6 GM/177ML PO SOLN
1.0000 | Freq: Once | ORAL | Status: DC
Start: 2014-02-25 — End: 2014-03-11

## 2014-02-25 NOTE — Progress Notes (Signed)
No home 02 use No egg or soy allergy No diet pills No issues with past sedation, morphine causes N/V Going over instructions in Pv w/ Pt he states that if his sugar drops he is going to eat no matter what the instructions say. Encouraged pt to do clear liquids that contain sugar, hard candy etc but told to not eat soild foods the day before or the day of his procedure, he again states he doesn't care what it says if his sugar drops he's going to eat, told multiple times to not eat solids before  His procedure as instructed.

## 2014-03-05 ENCOUNTER — Ambulatory Visit (INDEPENDENT_AMBULATORY_CARE_PROVIDER_SITE_OTHER): Payer: 59 | Admitting: Internal Medicine

## 2014-03-05 ENCOUNTER — Encounter: Payer: Self-pay | Admitting: Internal Medicine

## 2014-03-05 VITALS — BP 140/80 | HR 88 | Temp 97.9°F | Resp 20 | Ht 72.0 in | Wt 300.0 lb

## 2014-03-05 DIAGNOSIS — E119 Type 2 diabetes mellitus without complications: Secondary | ICD-10-CM

## 2014-03-05 DIAGNOSIS — E669 Obesity, unspecified: Secondary | ICD-10-CM | POA: Diagnosis not present

## 2014-03-05 MED ORDER — INSULIN LISPRO 100 UNIT/ML (KWIKPEN): PEN_INJECTOR | SUBCUTANEOUS | Status: DC

## 2014-03-05 MED ORDER — INSULIN DETEMIR 100 UNIT/ML FLEXPEN: 30.0000 [IU] | PEN_INJECTOR | Freq: Every day | SUBCUTANEOUS | Status: DC

## 2014-03-05 NOTE — Patient Instructions (Signed)
Observe new insulin dosing  Return in one month for follow-up

## 2014-03-05 NOTE — Progress Notes (Signed)
Subjective:    Patient ID: [name], male    DOB: 12-28-1955, 59 y.o.   MRN: [id number]  HPI  59 year old patient who has recently been started on basal bolus insulin.  He misunderstood instructions, but now is on a regimen of basal insulin 24 units and mealtime insulin 10 units before breakfast and lunch and 16 units before his evening meal Fasting blood sugars generally range from 180-220 Blood sugars early afternoon are usually over 200 as well  Past Medical History  Diagnosis Date  . History of hip replacement, total   . Diabetes mellitus   . Hypertension   . Hyperlipidemia   . GERD (gastroesophageal reflux disease)   . Obesity   . Chronic pain   . History of colonic polyps   . RBBB (right bundle branch block with left anterior fascicular block)   . 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 Left anterior hemiblock   . Adenomatous polyp   . Hx of anaphylactic shock   . Cataract     left eye for sure     History   Social History  . Marital Status: Married    Spouse Name: N/A  . Number of Children: N/A  . Years of Education: N/A   Occupational History  . Not on file.   Social History Main Topics  . Smoking status: Former Smoker    Quit date: 09/03/2013  . Smokeless tobacco: Former Systems developer    Types: Chew  . Alcohol Use: No     Comment: recovering alcoholic   . Drug Use: No  . Sexual Activity: Not on file   Other Topics Concern  . Not on file   Social History Narrative   Recovering alcoholic      Minister     Past Surgical History  Procedure Laterality Date  . Hip surgery  12/29/09    R THR  (Nitka)  . Appendectomy    . Hand surgery    . Cervical laminectomy    . Cervical fusion    . Hip surgery      bilateral  . Total knee arthroplasty  01/2008    left knee  . Total hip arthroplasty Left 09/10/2012    Procedure: REMOVAL OF HARDWARE LEFT PROXIMAL THIGH/FEMUR AND LEFT TOTAL HIP ARTHROPLASTY;  Surgeon: Jessy Oto, MD;  Location:  [place];  Service: Orthopedics;  Laterality: Left;  . Hardware removal Left 09/10/2012    Procedure: HARDWARE REMOVAL;  Surgeon: Jessy Oto, MD;  Location: [place];  Service: Orthopedics;  Laterality: Left;  . Colonoscopy      Family History  Problem Relation Age of Onset  . Cancer Mother     lung  . Crohn's disease Father   . Alcohol abuse Brother   . Colon polyps Brother   . Colon cancer Neg Hx     Allergies  Allergen Reactions  . Codeine     REACTION: itch  . Morphine And Related Nausea And Vomiting  . Naproxen     REACTION: anaphylaxis    Current Outpatient Prescriptions on File Prior to Visit  Medication Sig Dispense Refill  . atorvastatin (LIPITOR) 40 MG tablet Take 1 tablet by mouth  daily 90 tablet 1  . diclofenac sodium (VOLTAREN) 1 % GEL Apply topically.      Marland Kitchen glucose blood (ONE TOUCH ULTRA TEST) test strip USE TO CHECK BLOOD SUGAR DAILY AND PRN 100 each 4  . HYDROcodone-acetaminophen (  NORCO) 7.5-325 MG per tablet Take 1-2 tablets by mouth every 4 (four) hours as needed for pain (maximum of 8 total pain medication and tylenol tablets per day.). 60 tablet 1  . insulin lispro (HUMALOG KWIKPEN) 100 UNIT/ML KiwkPen 6 units prior to breakfast and lunch and 12 units prior to your evening meal If blood sugar is greater than 180, add an additional 4 units (Patient taking differently: Inject 10 Units into the skin. 6 units prior to breakfast and lunch and 12 units prior to your evening meal If blood sugar is greater than 180, add an additional 4 units) 15 pen 3  . Insulin Pen Needle (NOVOTWIST) 32G X 5 MM MISC 1 each by Does not apply route as directed. 100 each 3  . lidocaine (LIDODERM) 5 % Place 1 patch onto the skin as needed.     Marland Kitchen lisinopril-hydrochlorothiazide (PRINZIDE,ZESTORETIC) 20-25 MG per tablet Take 1 tablet by mouth  daily 90 tablet 1  . metFORMIN (GLUCOPHAGE) 1000 MG tablet Take 1 tablet by mouth  twice a day with a meal 180 tablet 1  . methadone (DOLOPHINE) 10 MG  tablet Take one to four tablets daily as needed 90 tablet 0  . methocarbamol (ROBAXIN) 750 MG tablet Take 1 tablet by mouth  every 6 hours as needed 90 tablet 1  . Multiple Vitamins-Minerals (MULTIVITAMIN WITH MINERALS) tablet Take 1 tablet by mouth daily.    . Na Sulfate-K Sulfate-Mg Sulf (SUPREP BOWEL PREP) SOLN Take 1 kit by mouth once. suprep as directed. No substitutions 354 mL 0  . omeprazole (PRILOSEC) 20 MG capsule Take 1 capsule (20 mg total) by mouth daily. 90 capsule 3  . promethazine (PHENERGAN) 25 MG tablet Take 1 tablet by mouth  every 6 hours as needed for nausea or vomiting 90 tablet 0  . Sildenafil Citrate (VIAGRA PO) Take 1 tablet by mouth as needed.    . Insulin Glargine (TOUJEO SOLOSTAR) 300 UNIT/ML SOPN Inject 20 Units into the skin daily after supper. (Patient not taking: Reported on 03/05/2014) 3 pen 6   No current facility-administered medications on file prior to visit.    BP 140/80 mmHg  Pulse 88  Temp(Src) 97.9 F (36.6 C) (Oral)  Resp 20  Ht 6' (1.829 m)  Wt 300 lb (136.079 kg)  BMI 40.68 kg/m2  SpO2 98%     Review of Systems    No hypoglycemic symptoms  Objective:   Physical Exam  Constitutional: He appears well-developed and well-nourished. No distress.  Repeat blood pressure much lower at 110 over 70  Vitals reviewed.         Assessment & Plan:  Diabetes.  We'll switch to Levemir due to insurance concerns Titrate both basal and bolus insulin Recheck one month

## 2014-03-05 NOTE — Progress Notes (Signed)
Subjective:    Patient ID: [name], male    DOB: [birth year], 59 y.o.   MRN: [id number]  HPI  Wt Readings from Last 3 Encounters:  03/05/14 300 lb (136.079 kg)  02/25/14 304 lb 3.2 oz (137.984 kg)  02/03/14 298 lb (135.3 kg)    59 year old patient who is seen today for follow-up of type 2 diabetes.  He is now on basal bolus therapy.  He misunderstood initial written instructions, but seems to understand the present therapy. Fasting blood sugars generally range between 180 and 220.  Blood sugars in the early afternoon are consistently over 200. General he feels well.  No hypoglycemia.   Past Medical History  Diagnosis Date  . History of hip replacement, total   . Diabetes mellitus   . Hypertension   . Hyperlipidemia   . GERD (gastroesophageal reflux disease)   . Obesity   . Chronic pain   . History of colonic polyps   . RBBB (right bundle branch block with left anterior fascicular block)   . 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 Left anterior hemiblock   . Adenomatous polyp   . Hx of anaphylactic shock   . Cataract     left eye for sure     History   Social History  . Marital Status: Married    Spouse Name: N/A  . Number of Children: N/A  . Years of Education: N/A   Occupational History  . Not on file.   Social History Main Topics  . Smoking status: Former Smoker    Quit date: 09/03/2013  . Smokeless tobacco: Former Systems developer    Types: Chew  . Alcohol Use: No     Comment: recovering alcoholic   . Drug Use: No  . Sexual Activity: Not on file   Other Topics Concern  . Not on file   Social History Narrative   Recovering alcoholic      Minister     Past Surgical History  Procedure Laterality Date  . Hip surgery  12/29/09    R THR  (Nitka)  . Appendectomy    . Hand surgery    . Cervical laminectomy    . Cervical fusion    . Hip surgery      bilateral  . Total knee arthroplasty  01/2008    left knee  . Total hip  arthroplasty Left 09/10/2012    Procedure: REMOVAL OF HARDWARE LEFT PROXIMAL THIGH/FEMUR AND LEFT TOTAL HIP ARTHROPLASTY;  Surgeon: Jessy Oto, MD;  Location: [place];  Service: Orthopedics;  Laterality: Left;  . Hardware removal Left 09/10/2012    Procedure: HARDWARE REMOVAL;  Surgeon: Jessy Oto, MD;  Location: [place];  Service: Orthopedics;  Laterality: Left;  . Colonoscopy      Family History  Problem Relation Age of Onset  . Cancer Mother     lung  . Crohn's disease Father   . Alcohol abuse Brother   . Colon polyps Brother   . Colon cancer Neg Hx     Allergies  Allergen Reactions  . Codeine     REACTION: itch  . Morphine And Related Nausea And Vomiting  . Naproxen     REACTION: anaphylaxis    Current Outpatient Prescriptions on File Prior to Visit  Medication Sig Dispense Refill  . atorvastatin (LIPITOR) 40 MG tablet Take 1 tablet by mouth  daily 90 tablet 1  . diclofenac sodium (VOLTAREN) 1 %  GEL Apply topically.      Marland Kitchen glucose blood (ONE TOUCH ULTRA TEST) test strip USE TO CHECK BLOOD SUGAR DAILY AND PRN 100 each 4  . HYDROcodone-acetaminophen (NORCO) 7.5-325 MG per tablet Take 1-2 tablets by mouth every 4 (four) hours as needed for pain (maximum of 8 total pain medication and tylenol tablets per day.). 60 tablet 1  . Insulin Pen Needle (NOVOTWIST) 32G X 5 MM MISC 1 each by Does not apply route as directed. 100 each 3  . lidocaine (LIDODERM) 5 % Place 1 patch onto the skin as needed.     Marland Kitchen lisinopril-hydrochlorothiazide (PRINZIDE,ZESTORETIC) 20-25 MG per tablet Take 1 tablet by mouth  daily 90 tablet 1  . metFORMIN (GLUCOPHAGE) 1000 MG tablet Take 1 tablet by mouth  twice a day with a meal 180 tablet 1  . methadone (DOLOPHINE) 10 MG tablet Take one to four tablets daily as needed 90 tablet 0  . methocarbamol (ROBAXIN) 750 MG tablet Take 1 tablet by mouth  every 6 hours as needed 90 tablet 1  . Multiple Vitamins-Minerals (MULTIVITAMIN WITH MINERALS) tablet Take 1 tablet by  mouth daily.    . Na Sulfate-K Sulfate-Mg Sulf (SUPREP BOWEL PREP) SOLN Take 1 kit by mouth once. suprep as directed. No substitutions 354 mL 0  . omeprazole (PRILOSEC) 20 MG capsule Take 1 capsule (20 mg total) by mouth daily. 90 capsule 3  . promethazine (PHENERGAN) 25 MG tablet Take 1 tablet by mouth  every 6 hours as needed for nausea or vomiting 90 tablet 0  . Sildenafil Citrate (VIAGRA PO) Take 1 tablet by mouth as needed.     No current facility-administered medications on file prior to visit.    BP 140/80 mmHg  Pulse 88  Temp(Src) 97.9 F (36.6 C) (Oral)  Resp 20  Ht 6' (1.829 m)  Wt 300 lb (136.079 kg)  BMI 40.68 kg/m2  SpO2 98%    Review of Systems  Constitutional: Negative for fever, chills, appetite change and fatigue.  HENT: Negative for congestion, dental problem, ear pain, hearing loss, sore throat, tinnitus, trouble swallowing and voice change.   Eyes: Negative for pain, discharge and visual disturbance.  Respiratory: Negative for cough, chest tightness, wheezing and stridor.   Cardiovascular: Negative for chest pain, palpitations and leg swelling.  Gastrointestinal: Negative for nausea, vomiting, abdominal pain, diarrhea, constipation, blood in stool and abdominal distention.  Genitourinary: Negative for urgency, hematuria, flank pain, discharge, difficulty urinating and genital sores.  Musculoskeletal: Negative for myalgias, back pain, joint swelling, arthralgias, gait problem and neck stiffness.  Skin: Negative for rash.  Neurological: Negative for dizziness, syncope, speech difficulty, weakness, numbness and headaches.  Hematological: Negative for adenopathy. Does not bruise/bleed easily.  Psychiatric/Behavioral: Negative for behavioral problems and dysphoric mood. The patient is not nervous/anxious.        Objective:   Physical Exam  Constitutional: He appears well-developed and well-nourished. No distress.  Repeat blood pressure 110/78 Weight 300           Assessment & Plan:

## 2014-03-05 NOTE — Progress Notes (Signed)
Pre visit review using our clinic review tool, if applicable. No additional management support is needed unless otherwise documented below in the visit note. 

## 2014-03-11 ENCOUNTER — Ambulatory Visit (AMBULATORY_SURGERY_CENTER): Payer: 59 | Admitting: Gastroenterology

## 2014-03-11 ENCOUNTER — Encounter: Payer: Self-pay | Admitting: Gastroenterology

## 2014-03-11 VITALS — BP 138/61 | HR 77 | Temp 97.3°F | Resp 17 | Ht 72.0 in | Wt 300.0 lb

## 2014-03-11 DIAGNOSIS — Z86010 Personal history of colonic polyps: Secondary | ICD-10-CM | POA: Diagnosis not present

## 2014-03-11 DIAGNOSIS — E1110 Type 2 diabetes mellitus with ketoacidosis without coma: Secondary | ICD-10-CM

## 2014-03-11 DIAGNOSIS — K5730 Diverticulosis of large intestine without perforation or abscess without bleeding: Secondary | ICD-10-CM

## 2014-03-11 LAB — GLUCOSE, CAPILLARY
Glucose-Capillary: 333 mg/dL — ABNORMAL HIGH (ref 70–99)
Glucose-Capillary: 262 mg/dL — ABNORMAL HIGH (ref 70–99)

## 2014-03-11 MED ORDER — INSULIN LISPRO 100 UNIT/ML ~~LOC~~ SOLN: 7.0000 [IU] | Freq: Once | SUBCUTANEOUS | Status: DC

## 2014-03-11 MED ORDER — SODIUM CHLORIDE 0.9 % IV SOLN: 500.0000 mL | INTRAVENOUS | Status: DC

## 2014-03-11 NOTE — Patient Instructions (Signed)
YOU HAD AN ENDOSCOPIC PROCEDURE TODAY AT [facility]:   Refer to the procedure report that was given to you for any specific questions about what was found during the examination.  If the procedure report does not answer your questions, please call your gastroenterologist to clarify.  If you requested that your care partner not be given the details of your procedure findings, then the procedure report has been included in a sealed envelope for you to review at your convenience later.  YOU SHOULD EXPECT: Some feelings of bloating in the abdomen. Passage of more gas than usual.  Walking can help get rid of the air that was put into your GI tract during the procedure and reduce the bloating. If you had a lower endoscopy (such as a colonoscopy or flexible sigmoidoscopy) you may notice spotting of blood in your stool or on the toilet paper. If you underwent a bowel prep for your procedure, you may not have a normal bowel movement for a few days.  Please Note:  You might notice some irritation and congestion in your nose or some drainage.  This is from the oxygen used during your procedure.  There is no need for concern and it should clear up in a day or so.  SYMPTOMS TO REPORT IMMEDIATELY:   Following lower endoscopy (colonoscopy or flexible sigmoidoscopy):  Excessive amounts of blood in the stool  Significant tenderness or worsening of abdominal pains  Swelling of the abdomen that is new, acute  Fever of 100F or higher  For urgent or emergent issues, a gastroenterologist can be reached at any hour by calling [phone].  DIET: Your first meal following the procedure should be a small meal and then it is ok to progress to your normal diet. Heavy or fried foods are harder to digest and may make you feel nauseous or bloated.  Likewise, meals heavy in dairy and vegetables can increase bloating.  Drink plenty of fluids but you should avoid alcoholic beverages for 24 hours.  ACTIVITY:   You should plan to take it easy for the rest of today and you should NOT DRIVE or use heavy machinery until tomorrow (because of the sedation medicines used during the test).    FOLLOW UP: Our staff will call the number listed on your records the next business day following your procedure to check on you and address any questions or concerns that you may have regarding the information given to you following your procedure. If we do not reach you, we will leave a message.  However, if you are feeling well and you are not experiencing any problems, there is no need to return our call.  We will assume that you have returned to your regular daily activities without incident.  SIGNATURES/CONFIDENTIALITY: You and/or your care partner have signed paperwork which will be entered into your electronic medical record.  These signatures attest to the fact that that the information above on your After Visit Summary has been reviewed and is understood.  Full responsibility of the confidentiality of this discharge information lies with you and/or your care-partner.  Continue your normal medications  Please read over handouts about diverticulosis and high fiber diets Next colonoscopy- 10 years

## 2014-03-11 NOTE — Progress Notes (Signed)
Dr. Deatra Ina informed of CBG of 333.  Humalog 7 units stat per Dr. Deatra Ina; given in lower r abd sq.

## 2014-03-11 NOTE — Progress Notes (Signed)
Report to PACU, RN, vss, BBS= Clear.  

## 2014-03-11 NOTE — Op Note (Addendum)
[facility]  [address] [place], [address]   COLONOSCOPY PROCEDURE REPORT  PATIENT: [name], [name]  MR#: [id number] BIRTHDATE: [birth year] , 40  yrs. old GENDER: male ENDOSCOPIST: Inda Castle, MD REFERRED [id number] Burnice Logan, M.D. PROCEDURE DATE:  03/11/2014 PROCEDURE:   Colonoscopy, diagnostic First Screening Colonoscopy - Avg.  risk and is 50 yrs.  old or older - No.  Prior Negative Screening - Now for repeat screening. Other: See Comments  History of Adenoma - Now for follow-up colonoscopy & has been > or = to 3 yrs.  Yes hx of adenoma.  Has been 3 or more years since last colonoscopy.  Polyps Removed Today? No.  Recommend repeat exam, <10 yrs? Polyps Removed Today? No. Recommend repeat exam, <10 yrs? No. ASA CLASS:   Class II INDICATIONS:PH Colon Adenoma.  2003.  2008 colonoscopy negative for polyps MEDICATIONS: Monitored anesthesia care, Propofol 200 mg IV, and Lidocaine 200 mg IV  DESCRIPTION OF PROCEDURE:   After the risks benefits and alternatives of the procedure were thoroughly explained, informed consent was obtained.  The digital rectal exam revealed no abnormalities of the rectum.   The [id number]  endoscope was introduced through the anus and advanced to the cecum, which was identified by both the appendix and ileocecal valve. No adverse events experienced.   The quality of the prep was (Suprep was used) excellent.  The instrument was then slowly withdrawn as the colon was fully examined.      COLON FINDINGS: There was mild diverticulosis noted in the descending colon and sigmoid colon.   The examination was otherwise normal.  Retroflexed views revealed no abnormalities. The time to cecum = 2.5 Withdrawal time = 6.1   The scope was withdrawn and the procedure completed. COMPLICATIONS: There were no immediate complications.  ENDOSCOPIC IMPRESSION: 1.   Mild diverticulosis was noted in the descending colon and sigmoid  colon 2.   The examination was otherwise normal  RECOMMENDATIONS: Colonoscopy 10 years  eSigned:  Inda Castle, MD 03/11/2014 11:11 AM Revised: 03/11/2014 11:11 AM  cc:   PATIENT NAME:  [name], [name] MR#: [id number]

## 2014-03-12 ENCOUNTER — Telehealth: Payer: Self-pay | Admitting: *Deleted

## 2014-03-12 NOTE — Telephone Encounter (Signed)
  Follow up Call-  Call back number 03/11/2014  Post procedure Call Back phone  # [phone]  Permission to leave phone message Yes     Patient questions:  Do you have a fever, pain , or abdominal swelling? No. Pain Score  0 *  Have you tolerated food without any problems? Yes.    Have you been able to return to your normal activities? Yes.    Do you have any questions about your discharge instructions: Diet   No. Medications  No. Follow up visit  No.  Do you have questions or concerns about your Care? No.  Actions: * If pain score is 4 or above: No action needed, pain <4.

## 2014-04-03 DIAGNOSIS — M7541 Impingement syndrome of right shoulder: Secondary | ICD-10-CM | POA: Diagnosis not present

## 2014-04-03 DIAGNOSIS — M4806 Spinal stenosis, lumbar region: Secondary | ICD-10-CM | POA: Diagnosis not present

## 2014-04-08 ENCOUNTER — Encounter: Payer: Self-pay | Admitting: Internal Medicine

## 2014-04-08 ENCOUNTER — Ambulatory Visit (INDEPENDENT_AMBULATORY_CARE_PROVIDER_SITE_OTHER): Payer: 59 | Admitting: Internal Medicine

## 2014-04-08 VITALS — BP 130/80 | HR 88 | Temp 98.0°F | Resp 20 | Ht 72.0 in | Wt 288.0 lb

## 2014-04-08 DIAGNOSIS — E119 Type 2 diabetes mellitus without complications: Secondary | ICD-10-CM

## 2014-04-08 DIAGNOSIS — E669 Obesity, unspecified: Secondary | ICD-10-CM

## 2014-04-08 MED ORDER — INSULIN DETEMIR 100 UNIT/ML FLEXPEN: 40.0000 [IU] | PEN_INJECTOR | Freq: Every day | SUBCUTANEOUS | Status: DC

## 2014-04-08 NOTE — Patient Instructions (Signed)
Limit your sodium (Salt) intake  You need to lose weight.  Consider a lower calorie diet and regular exercise.   Please check your hemoglobin A1c every 3 months   

## 2014-04-08 NOTE — Progress Notes (Signed)
Pre visit review using our clinic review tool, if applicable. No additional management support is needed unless otherwise documented below in the visit note. 

## 2014-04-08 NOTE — Progress Notes (Signed)
Subjective:    Patient ID: [name], male    DOB: [birth year], 59 y.o.   MRN: [id number]  HPI  Wt Readings from Last 3 Encounters:  04/08/14 288 lb (130.636 kg)  03/11/14 300 lb (136.079 kg)  03/05/14 300 lb (136.38 kg)   59 year old who is seen today for follow-up of diabetes mellitus.  He has recently been placed on basal bolus insulin.  Presently is on Levemir insulin 34 units at bedtime.  He states fasting blood sugars generally range from 210 to 230.  He is on mealtime insulin 14 units prior to breakfast and lunch and 24 units prior to his evening meal.  No hypoglycemia He has started to exercise more regularly and there is been a 12 pound weight loss.  He is waiting on an exercise bike ; generally feels well.  Past Medical History  Diagnosis Date  . History of hip replacement, total   . Diabetes mellitus   . Hypertension   . Hyperlipidemia   . GERD (gastroesophageal reflux disease)   . Obesity   . Chronic pain   . History of colonic polyps   . RBBB (right bundle branch block with left anterior fascicular block)   . 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 Left anterior hemiblock   . Adenomatous polyp   . Hx of anaphylactic shock   . Cataract     left eye for sure     History   Social History  . Marital Status: Married    Spouse Name: N/A  . Number of Children: N/A  . Years of Education: N/A   Occupational History  . Not on file.   Social History Main Topics  . Smoking status: Former Smoker    Quit date: 09/03/2013  . Smokeless tobacco: Former Systems developer    Types: Chew  . Alcohol Use: No     Comment: recovering alcoholic   . Drug Use: No  . Sexual Activity: Not on file   Other Topics Concern  . Not on file   Social History Narrative   Recovering alcoholic      Minister     Past Surgical History  Procedure Laterality Date  . Hip surgery  12/29/09    R THR  (Nitka)  . Appendectomy    . Hand surgery    . Cervical laminectomy      . Cervical fusion    . Hip surgery      bilateral  . Total knee arthroplasty  01/2008    left knee  . Total hip arthroplasty Left 09/10/2012    Procedure: REMOVAL OF HARDWARE LEFT PROXIMAL THIGH/FEMUR AND LEFT TOTAL HIP ARTHROPLASTY;  Surgeon: Jessy Oto, MD;  Location: [facility];  Service: Orthopedics;  Laterality: Left;  . Hardware removal Left 09/10/2012    Procedure: HARDWARE REMOVAL;  Surgeon: Jessy Oto, MD;  Location: [facility];  Service: Orthopedics;  Laterality: Left;  . Colonoscopy      Family History  Problem Relation Age of Onset  . Cancer Mother     lung  . Crohn's disease Father   . Alcohol abuse Brother   . Colon polyps Brother   . Colon cancer Neg Hx     Allergies  Allergen Reactions  . Codeine     REACTION: itch  . Morphine And Related Nausea And Vomiting  . Naproxen     REACTION: anaphylaxis    Current Outpatient Prescriptions on File Prior  to Visit  Medication Sig Dispense Refill  . atorvastatin (LIPITOR) 40 MG tablet Take 1 tablet by mouth  daily 90 tablet 1  . diclofenac sodium (VOLTAREN) 1 % GEL Apply topically.      Marland Kitchen glucose blood (ONE TOUCH ULTRA TEST) test strip USE TO CHECK BLOOD SUGAR DAILY AND PRN 100 each 4  . HYDROcodone-acetaminophen (NORCO) 7.5-325 MG per tablet Take 1-2 tablets by mouth every 4 (four) hours as needed for pain (maximum of 8 total pain medication and tylenol tablets per day.). 60 tablet 1  . insulin lispro (HUMALOG KWIKPEN) 100 UNIT/ML KiwkPen 14 units prior to breakfast and lunch and 20  units prior to your evening meal;   If blood sugar is greater than 180, add an additional 4 units 15 pen 3  . Insulin Pen Needle (NOVOTWIST) 32G X 5 MM MISC 1 each by Does not apply route as directed. 100 each 3  . lidocaine (LIDODERM) 5 % Place 1 patch onto the skin as needed.     Marland Kitchen lisinopril-hydrochlorothiazide (PRINZIDE,ZESTORETIC) 20-25 MG per tablet Take 1 tablet by mouth  daily 90 tablet 1  . metFORMIN (GLUCOPHAGE) 1000 MG tablet Take 1  tablet by mouth  twice a day with a meal 180 tablet 1  . methadone (DOLOPHINE) 10 MG tablet Take one to four tablets daily as needed 90 tablet 0  . methocarbamol (ROBAXIN) 750 MG tablet Take 1 tablet by mouth  every 6 hours as needed 90 tablet 1  . Multiple Vitamins-Minerals (MULTIVITAMIN WITH MINERALS) tablet Take 1 tablet by mouth daily.    Marland Kitchen omeprazole (PRILOSEC) 20 MG capsule Take 1 capsule (20 mg total) by mouth daily. 90 capsule 3  . promethazine (PHENERGAN) 25 MG tablet Take 1 tablet by mouth  every 6 hours as needed for nausea or vomiting 90 tablet 0  . Sildenafil Citrate (VIAGRA PO) Take 1 tablet by mouth as needed.     No current facility-administered medications on file prior to visit.    BP 130/80 mmHg  Pulse 88  Temp(Src) 98 F (36.7 C) (Oral)  Resp 20  Ht 6' (1.829 m)  Wt 288 lb (130.636 kg)  BMI 39.05 kg/m2  SpO2 97%      Review of Systems  Constitutional: Negative for fever, chills, appetite change and fatigue.  HENT: Negative for congestion, dental problem, ear pain, hearing loss, sore throat, tinnitus, trouble swallowing and voice change.   Eyes: Negative for pain, discharge and visual disturbance.  Respiratory: Negative for cough, chest tightness, wheezing and stridor.   Cardiovascular: Negative for chest pain, palpitations and leg swelling.  Gastrointestinal: Negative for nausea, vomiting, abdominal pain, diarrhea, constipation, blood in stool and abdominal distention.  Genitourinary: Negative for urgency, hematuria, flank pain, discharge, difficulty urinating and genital sores.  Musculoskeletal: Negative for myalgias, back pain, joint swelling, arthralgias, gait problem and neck stiffness.  Skin: Negative for rash.  Neurological: Negative for dizziness, syncope, speech difficulty, weakness, numbness and headaches.  Hematological: Negative for adenopathy. Does not bruise/bleed easily.  Psychiatric/Behavioral: Negative for behavioral problems and dysphoric  mood. The patient is not nervous/anxious.        Objective:   Physical Exam  Constitutional: He appears well-developed and well-nourished. No distress.  Blood pressure 126/76 Weight 288          Assessment & Plan:      Diabetes mellitus.  Will increase Levemir from 34-40 units at bedtime.  Will increase an additional 6 units if blood sugars are not  consistently less than 150.  We'll continue present mealtime insulin regimen.  Continue efforts at weight loss and exercise.  Recheck 6 weeks Hypertension, stable Obesity.  Improved

## 2014-05-06 ENCOUNTER — Other Ambulatory Visit: Payer: Self-pay | Admitting: *Deleted

## 2014-05-06 MED ORDER — INSULIN PEN NEEDLE 32G X 5 MM MISC: 1.0000 | Status: DC

## 2014-05-20 ENCOUNTER — Ambulatory Visit (INDEPENDENT_AMBULATORY_CARE_PROVIDER_SITE_OTHER): Payer: 59 | Admitting: Internal Medicine

## 2014-05-20 ENCOUNTER — Encounter: Payer: Self-pay | Admitting: Internal Medicine

## 2014-05-20 VITALS — BP 130/80 | HR 88 | Temp 98.1°F | Resp 20 | Ht 72.0 in | Wt 296.0 lb

## 2014-05-20 DIAGNOSIS — I10 Essential (primary) hypertension: Secondary | ICD-10-CM | POA: Diagnosis not present

## 2014-05-20 DIAGNOSIS — E119 Type 2 diabetes mellitus without complications: Secondary | ICD-10-CM | POA: Diagnosis not present

## 2014-05-20 DIAGNOSIS — M159 Polyosteoarthritis, unspecified: Secondary | ICD-10-CM

## 2014-05-20 DIAGNOSIS — M8949 Other hypertrophic osteoarthropathy, multiple sites: Secondary | ICD-10-CM

## 2014-05-20 DIAGNOSIS — E669 Obesity, unspecified: Secondary | ICD-10-CM | POA: Diagnosis not present

## 2014-05-20 DIAGNOSIS — M150 Primary generalized (osteo)arthritis: Secondary | ICD-10-CM | POA: Diagnosis not present

## 2014-05-20 MED ORDER — INSULIN DETEMIR 100 UNIT/ML FLEXPEN: 44.0000 [IU] | PEN_INJECTOR | Freq: Every day | SUBCUTANEOUS | Status: DC

## 2014-05-20 NOTE — Progress Notes (Signed)
Pre visit review using our clinic review tool, if applicable. No additional management support is needed unless otherwise documented below in the visit note. 

## 2014-05-20 NOTE — Patient Instructions (Signed)
Increase Levemir to 44 units at bedtime.  If fasting blood sugars are consistently greater than 150, further increase to 48 units at bedtime  Humalog 14 units prior to breakfast and lunch and 24 units prior to your evening meal; if blood sugar prior to your meal is greater than 180, add an additional 4 units  Return in 6 weeks for follow-up.  Hemoglobin A1c will be checked at that time  You need to lose weight.  Consider a lower calorie diet and regular exercise.    It is important that you exercise regularly, at least 20 minutes 3 to 4 times per week.  If you develop chest pain or shortness of breath seek  medical attention.

## 2014-05-20 NOTE — Progress Notes (Signed)
Subjective:    Patient ID: [name], male    DOB: June 21, 1955, 59 y.o.   MRN: [id number]  HPI  Lab Results  Component Value Date   HGBA1C 9.5* 01/02/2014   59 year old patient who is seen today for follow-up of diabetes.  He has recently been placed on insulin therapy Presently he is on Levemir 40 units at bedtime.  Fasting blood sugars remain in the 170-190 range. He monitors blood sugars 2 or 3 times daily.  He is on Humalog 14 units prior to breakfast and lunch and 24 units prior to his evening meal.  No sliding scale coverage for blood sugar greater than 150 as instructed  He is recovering from a URI and has fallen off his exercise regimen.  Weight is up 8 pounds.  He now feels well  Past Medical History  Diagnosis Date  . History of hip replacement, total   . Diabetes mellitus   . Hypertension   . Hyperlipidemia   . GERD (gastroesophageal reflux disease)   . Obesity   . Chronic pain   . History of colonic polyps   . RBBB (right bundle branch block with left anterior fascicular block)   . 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 Left anterior hemiblock   . Adenomatous polyp   . Hx of anaphylactic shock   . Cataract     left eye for sure     History   Social History  . Marital Status: Married    Spouse Name: N/A  . Number of Children: N/A  . Years of Education: N/A   Occupational History  . Not on file.   Social History Main Topics  . Smoking status: Former Smoker    Quit date: 09/03/2013  . Smokeless tobacco: Former Systems developer    Types: Chew  . Alcohol Use: No     Comment: recovering alcoholic   . Drug Use: No  . Sexual Activity: Not on file   Other Topics Concern  . Not on file   Social History Narrative   Recovering alcoholic      Minister     Past Surgical History  Procedure Laterality Date  . Hip surgery  12/29/09    R THR  (Nitka)  . Appendectomy    . Hand surgery    . Cervical laminectomy    . Cervical fusion    . Hip  surgery      bilateral  . Total knee arthroplasty  01/2008    left knee  . Total hip arthroplasty Left 09/10/2012    Procedure: REMOVAL OF HARDWARE LEFT PROXIMAL THIGH/FEMUR AND LEFT TOTAL HIP ARTHROPLASTY;  Surgeon: Jessy Oto, MD;  Location: [facility] [place];  Service: Orthopedics;  Laterality: Left;  . Hardware removal Left 09/10/2012    Procedure: HARDWARE REMOVAL;  Surgeon: Jessy Oto, MD;  Location: [facility];  Service: Orthopedics;  Laterality: Left;  . Colonoscopy      Family History  Problem Relation Age of Onset  . Cancer Mother     lung  . Crohn's disease Father   . Alcohol abuse Brother   . Colon polyps Brother   . Colon cancer Neg Hx     Allergies  Allergen Reactions  . Codeine     REACTION: itch  . Morphine And Related Nausea And Vomiting  . Naproxen     REACTION: anaphylaxis    Current Outpatient Prescriptions on File Prior to Visit  Medication  Sig Dispense Refill  . atorvastatin (LIPITOR) 40 MG tablet Take 1 tablet by mouth  daily 90 tablet 1  . diclofenac sodium (VOLTAREN) 1 % GEL Apply topically.      Marland Kitchen glucose blood (ONE TOUCH ULTRA TEST) test strip USE TO CHECK BLOOD SUGAR DAILY AND PRN 100 each 4  . HYDROcodone-acetaminophen (NORCO) 7.5-325 MG per tablet Take 1-2 tablets by mouth every 4 (four) hours as needed for pain (maximum of 8 total pain medication and tylenol tablets per day.). 60 tablet 1  . Insulin Detemir (LEVEMIR FLEXTOUCH) 100 UNIT/ML Pen Inject 40 Units into the skin daily at 10 pm. 10 pen 4  . insulin lispro (HUMALOG KWIKPEN) 100 UNIT/ML KiwkPen 14 units prior to breakfast and lunch and 20  units prior to your evening meal;   If blood sugar is greater than 180, add an additional 4 units 15 pen 3  . Insulin Pen Needle (NOVOTWIST) 32G X 5 MM MISC 1 each by Does not apply route as directed. 100 each 5  . lidocaine (LIDODERM) 5 % Place 1 patch onto the skin as needed.     Marland Kitchen lisinopril-hydrochlorothiazide (PRINZIDE,ZESTORETIC) 20-25 MG per tablet Take 1 tablet  by mouth  daily 90 tablet 1  . metFORMIN (GLUCOPHAGE) 1000 MG tablet Take 1 tablet by mouth  twice a day with a meal 180 tablet 1  . methadone (DOLOPHINE) 10 MG tablet Take one to four tablets daily as needed 90 tablet 0  . methocarbamol (ROBAXIN) 750 MG tablet Take 1 tablet by mouth  every 6 hours as needed 90 tablet 1  . Multiple Vitamins-Minerals (MULTIVITAMIN WITH MINERALS) tablet Take 1 tablet by mouth daily.    Marland Kitchen Nystatin (NYAMYC) 100000 UNIT/GM POWD Apply 1 Bottle topically as needed.     Marland Kitchen omeprazole (PRILOSEC) 20 MG capsule Take 1 capsule (20 mg total) by mouth daily. 90 capsule 3  . promethazine (PHENERGAN) 25 MG tablet Take 1 tablet by mouth  every 6 hours as needed for nausea or vomiting 90 tablet 0  . Sildenafil Citrate (VIAGRA PO) Take 1 tablet by mouth as needed.     No current facility-administered medications on file prior to visit.    BP 130/80 mmHg  Pulse 88  Temp(Src) 98.1 F (36.7 C) (Oral)  Resp 20  Ht 6' (1.829 m)  Wt 296 lb (134.265 kg)  BMI 40.14 kg/m2  SpO2 96%     Review of Systems  Constitutional: Positive for activity change, appetite change and unexpected weight change. Negative for fever, chills and fatigue.  HENT: Positive for congestion. Negative for dental problem, ear pain, hearing loss, sore throat, tinnitus, trouble swallowing and voice change.   Eyes: Negative for pain, discharge and visual disturbance.  Respiratory: Positive for cough. Negative for chest tightness, wheezing and stridor.   Cardiovascular: Negative for chest pain, palpitations and leg swelling.  Gastrointestinal: Negative for nausea, vomiting, abdominal pain, diarrhea, constipation, blood in stool and abdominal distention.  Genitourinary: Negative for urgency, hematuria, flank pain, discharge, difficulty urinating and genital sores.  Musculoskeletal: Negative for myalgias, back pain, joint swelling, arthralgias, gait problem and neck stiffness.  Skin: Negative for rash.    Neurological: Negative for dizziness, syncope, speech difficulty, weakness, numbness and headaches.  Hematological: Negative for adenopathy. Does not bruise/bleed easily.  Psychiatric/Behavioral: Negative for behavioral problems and dysphoric mood. The patient is not nervous/anxious.        Objective:   Physical Exam  Constitutional: He is oriented to person, place, and  time. He appears well-developed.  Blood pressure 130/78 Weight 296  HENT:  Head: Normocephalic.  Right Ear: External ear normal.  Left Ear: External ear normal.  Eyes: Conjunctivae and EOM are normal.  Neck: Normal range of motion.  Cardiovascular: Normal rate and normal heart sounds.   Pulmonary/Chest: Breath sounds normal.  Abdominal: Bowel sounds are normal.  Musculoskeletal: Normal range of motion. He exhibits no edema or tenderness.  Neurological: He is alert and oriented to person, place, and time.  Psychiatric: He has a normal mood and affect. His behavior is normal.          Assessment & Plan:   Status post URI Diabetes mellitus.  We'll uptitrate Levemir to 44 units at bedtime if blood sugar is not less than 150.  Will further increase to 48 units at bedtime Sliding scale insulin coverage for mealtime insulin.  Encouraged.  He will take additional converts if preprandial sugars are greater than 150 Obesity.  Weight loss exercise encouraged

## 2014-05-30 ENCOUNTER — Ambulatory Visit: Payer: 59 | Admitting: Internal Medicine

## 2014-06-23 ENCOUNTER — Other Ambulatory Visit: Payer: Self-pay | Admitting: Internal Medicine

## 2014-06-26 ENCOUNTER — Telehealth: Payer: Self-pay | Admitting: Internal Medicine

## 2014-06-26 NOTE — Telephone Encounter (Signed)
Pt needs new rx methadone pt will run out by tuesday

## 2014-06-27 MED ORDER — METHADONE HCL 10 MG PO TABS: ORAL_TABLET | ORAL | Status: DC

## 2014-06-27 NOTE — Telephone Encounter (Signed)
Pt notified Rx ready for pickup. Rx printed and signed.  

## 2014-07-01 ENCOUNTER — Ambulatory Visit (INDEPENDENT_AMBULATORY_CARE_PROVIDER_SITE_OTHER): Payer: 59 | Admitting: Internal Medicine

## 2014-07-01 ENCOUNTER — Encounter: Payer: Self-pay | Admitting: Internal Medicine

## 2014-07-01 VITALS — BP 130/80 | HR 87 | Temp 98.4°F | Resp 20 | Ht 72.0 in | Wt 287.0 lb

## 2014-07-01 DIAGNOSIS — M150 Primary generalized (osteo)arthritis: Secondary | ICD-10-CM

## 2014-07-01 DIAGNOSIS — R160 Hepatomegaly, not elsewhere classified: Secondary | ICD-10-CM

## 2014-07-01 DIAGNOSIS — I10 Essential (primary) hypertension: Secondary | ICD-10-CM

## 2014-07-01 DIAGNOSIS — M8949 Other hypertrophic osteoarthropathy, multiple sites: Secondary | ICD-10-CM

## 2014-07-01 DIAGNOSIS — E119 Type 2 diabetes mellitus without complications: Secondary | ICD-10-CM | POA: Diagnosis not present

## 2014-07-01 DIAGNOSIS — M159 Polyosteoarthritis, unspecified: Secondary | ICD-10-CM

## 2014-07-01 LAB — HEMOGLOBIN A1C: Hgb A1c MFr Bld: 8.5 % — ABNORMAL HIGH (ref 4.6–6.5)

## 2014-07-01 MED ORDER — INSULIN LISPRO 100 UNIT/ML (KWIKPEN): PEN_INJECTOR | SUBCUTANEOUS | Status: DC

## 2014-07-01 MED ORDER — INSULIN DETEMIR 100 UNIT/ML FLEXPEN: 50.0000 [IU] | PEN_INJECTOR | Freq: Every day | SUBCUTANEOUS | Status: DC

## 2014-07-01 NOTE — Patient Instructions (Signed)
You need to lose weight.  Consider a lower calorie diet and regular exercise.   Please check your hemoglobin A1c every 3 months

## 2014-07-01 NOTE — Progress Notes (Signed)
Subjective:    Patient ID: [name], male    DOB: December 09, 1955, 59 y.o.   MRN: [id number]  HPI  Lab Results  Component Value Date   HGBA1C 9.5* 01/02/2014    Wt Readings from Last 3 Encounters:  07/01/14 287 lb (130.182 kg)  05/20/14 296 lb (134.265 kg)  04/08/14 288 lb (130.57 kg)   59 year old patient who is seen today for follow-up of type 2 diabetes.  The patient is on 48 units of Levemir at bedtime.  He states that he has missed 2 dosages over the past month.  Fasting blood sugars are still in the 160-190 range  The patient has been taking 18 units prior to breakfast and lunch and 24 units prior to his evening meal.  He states that he occasionally takes 28 units prior to his evening meal.  Blood sugars throughout the day are generally over 150  There has been some modest weight loss over the past 6 weeks  Past Medical History  Diagnosis Date  . History of hip replacement, total   . Diabetes mellitus   . Hypertension   . Hyperlipidemia   . GERD (gastroesophageal reflux disease)   . Obesity   . Chronic pain   . History of colonic polyps   . RBBB (right bundle branch block with left anterior fascicular block)   . 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 Left anterior hemiblock   . Adenomatous polyp   . Hx of anaphylactic shock   . Cataract     left eye for sure     History   Social History  . Marital Status: Married    Spouse Name: N/A  . Number of Children: N/A  . Years of Education: N/A   Occupational History  . Not on file.   Social History Main Topics  . Smoking status: Former Smoker    Quit date: 09/03/2013  . Smokeless tobacco: Former Systems developer    Types: Chew  . Alcohol Use: No     Comment: recovering alcoholic   . Drug Use: No  . Sexual Activity: Not on file   Other Topics Concern  . Not on file   Social History Narrative   Recovering alcoholic      Minister     Past Surgical History  Procedure Laterality Date  . Hip  surgery  12/29/09    R THR  (Nitka)  . Appendectomy    . Hand surgery    . Cervical laminectomy    . Cervical fusion    . Hip surgery      bilateral  . Total knee arthroplasty  01/2008    left knee  . Total hip arthroplasty Left 09/10/2012    Procedure: REMOVAL OF HARDWARE LEFT PROXIMAL THIGH/FEMUR AND LEFT TOTAL HIP ARTHROPLASTY;  Surgeon: Jessy Oto, MD;  Location: [facility];  Service: Orthopedics;  Laterality: Left;  . Hardware removal Left 09/10/2012    Procedure: HARDWARE REMOVAL;  Surgeon: Jessy Oto, MD;  Location: [facility];  Service: Orthopedics;  Laterality: Left;  . Colonoscopy      Family History  Problem Relation Age of Onset  . Cancer Mother     lung  . Crohn's disease Father   . Alcohol abuse Brother   . Colon polyps Brother   . Colon cancer Neg Hx     Allergies  Allergen Reactions  . Codeine     REACTION: itch  . Morphine  And Related Nausea And Vomiting  . Naproxen     REACTION: anaphylaxis    Current Outpatient Prescriptions on File Prior to Visit  Medication Sig Dispense Refill  . atorvastatin (LIPITOR) 40 MG tablet Take 1 tablet by mouth  daily 90 tablet 1  . diclofenac sodium (VOLTAREN) 1 % GEL Apply topically.      Marland Kitchen HYDROcodone-acetaminophen (NORCO) 7.5-325 MG per tablet Take 1-2 tablets by mouth every 4 (four) hours as needed for pain (maximum of 8 total pain medication and tylenol tablets per day.). 60 tablet 1  . Insulin Detemir (LEVEMIR FLEXTOUCH) 100 UNIT/ML Pen Inject 44 Units into the skin daily at 10 pm. (Patient taking differently: Inject 48 Units into the skin daily at 10 pm. ) 10 pen 4  . insulin lispro (HUMALOG KWIKPEN) 100 UNIT/ML KiwkPen 14 units prior to breakfast and lunch and 20  units prior to your evening meal;   If blood sugar is greater than 180, add an additional 4 units (Patient taking differently: 18 units prior to breakfast and lunch and 24  units prior to your evening meal;   If blood sugar is greater than 180, add an additional 4  units) 15 pen 3  . Insulin Pen Needle (NOVOTWIST) 32G X 5 MM MISC 1 each by Does not apply route as directed. 100 each 5  . lidocaine (LIDODERM) 5 % Place 1 patch onto the skin as needed.     Marland Kitchen lisinopril-hydrochlorothiazide (PRINZIDE,ZESTORETIC) 20-25 MG per tablet Take 1 tablet by mouth  daily 90 tablet 1  . metFORMIN (GLUCOPHAGE) 1000 MG tablet Take 1 tablet by mouth  twice a day with a meal 180 tablet 1  . methadone (DOLOPHINE) 10 MG tablet Take one to four tablets daily as needed 90 tablet 0  . methocarbamol (ROBAXIN) 750 MG tablet Take 1 tablet by mouth  every 6 hours as needed 90 tablet 1  . Multiple Vitamins-Minerals (MULTIVITAMIN WITH MINERALS) tablet Take 1 tablet by mouth daily.    Marland Kitchen Nystatin (NYAMYC) 100000 UNIT/GM POWD Apply 1 Bottle topically as needed.     Marland Kitchen omeprazole (PRILOSEC) 20 MG capsule Take 1 capsule (20 mg total) by mouth daily. 90 capsule 3  . ONE TOUCH ULTRA TEST test strip Check blood sugar daily and as needed 200 each 3  . promethazine (PHENERGAN) 25 MG tablet Take 1 tablet by mouth  every 6 hours as needed for nausea or vomiting 90 tablet 0  . Sildenafil Citrate (VIAGRA PO) Take 1 tablet by mouth as needed.     No current facility-administered medications on file prior to visit.    BP 130/80 mmHg  Pulse 87  Temp(Src) 98.4 F (36.9 C) (Oral)  Resp 20  Ht 6' (1.829 m)  Wt 287 lb (130.182 kg)  BMI 38.92 kg/m2  SpO2 98%      Review of Systems     Objective:   Physical Exam        Assessment & Plan:

## 2014-07-01 NOTE — Progress Notes (Signed)
Pre visit review using our clinic review tool, if applicable. No additional management support is needed unless otherwise documented below in the visit note. 

## 2014-07-03 DIAGNOSIS — M545 Low back pain: Secondary | ICD-10-CM | POA: Diagnosis not present

## 2014-07-03 DIAGNOSIS — M5417 Radiculopathy, lumbosacral region: Secondary | ICD-10-CM | POA: Diagnosis not present

## 2014-07-03 DIAGNOSIS — M5116 Intervertebral disc disorders with radiculopathy, lumbar region: Secondary | ICD-10-CM | POA: Diagnosis not present

## 2014-07-03 DIAGNOSIS — M5416 Radiculopathy, lumbar region: Secondary | ICD-10-CM | POA: Diagnosis not present

## 2014-08-03 ENCOUNTER — Other Ambulatory Visit: Payer: Self-pay | Admitting: Internal Medicine

## 2014-08-12 ENCOUNTER — Telehealth: Payer: Self-pay | Admitting: Internal Medicine

## 2014-08-12 ENCOUNTER — Encounter: Payer: Self-pay | Admitting: Internal Medicine

## 2014-08-12 ENCOUNTER — Ambulatory Visit (INDEPENDENT_AMBULATORY_CARE_PROVIDER_SITE_OTHER): Payer: 59 | Admitting: Internal Medicine

## 2014-08-12 VITALS — BP 120/80 | HR 68 | Temp 98.4°F | Wt 281.0 lb

## 2014-08-12 DIAGNOSIS — M150 Primary generalized (osteo)arthritis: Secondary | ICD-10-CM

## 2014-08-12 DIAGNOSIS — I10 Essential (primary) hypertension: Secondary | ICD-10-CM | POA: Diagnosis not present

## 2014-08-12 DIAGNOSIS — R160 Hepatomegaly, not elsewhere classified: Secondary | ICD-10-CM | POA: Diagnosis not present

## 2014-08-12 DIAGNOSIS — M8949 Other hypertrophic osteoarthropathy, multiple sites: Secondary | ICD-10-CM

## 2014-08-12 DIAGNOSIS — E119 Type 2 diabetes mellitus without complications: Secondary | ICD-10-CM

## 2014-08-12 DIAGNOSIS — M159 Polyosteoarthritis, unspecified: Secondary | ICD-10-CM

## 2014-08-12 MED ORDER — INSULIN DETEMIR 100 UNIT/ML FLEXPEN: 56.0000 [IU] | PEN_INJECTOR | Freq: Every day | SUBCUTANEOUS | Status: DC

## 2014-08-12 NOTE — Progress Notes (Signed)
Subjective:    Patient ID: [name], male    DOB: 12-09-1955, 59 y.o.   MRN: [id number]  HPI Wt Readings from Last 3 Encounters:  08/12/14 281 lb (127.461 kg)  07/01/14 287 lb (130.182 kg)  05/20/14 296 lb (134.265 kg)   Lab Results  Component Value Date   HGBA1C 8.5* 07/01/2014    59 year old patient who is seen today for follow-up of type 2 diabetes. He is now on basal insulin 50 units at bedtime.  He has enjoyed improved glycemic control, but his lowest fasting blood sugar over the past month has been 146.  No hypoglycemia.  He continues to have some modest weight loss.  Generally feels well except for some rheumatologic complaints, especially both shoulders  He discontinued tobacco products approximately 24 months ago  Past Medical History  Diagnosis Date  . History of hip replacement, total   . Diabetes mellitus   . Hypertension   . Hyperlipidemia   . GERD (gastroesophageal reflux disease)   . Obesity   . Chronic pain   . History of colonic polyps   . RBBB (right bundle branch block with left anterior fascicular block)   . 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 Left anterior hemiblock   . Adenomatous polyp   . Hx of anaphylactic shock   . Cataract     left eye for sure     History   Social History  . Marital Status: Married    Spouse Name: N/A  . Number of Children: N/A  . Years of Education: N/A   Occupational History  . Not on file.   Social History Main Topics  . Smoking status: Former Smoker    Quit date: 09/03/2013  . Smokeless tobacco: Former Systems developer    Types: Chew  . Alcohol Use: No     Comment: recovering alcoholic   . Drug Use: No  . Sexual Activity: Not on file   Other Topics Concern  . Not on file   Social History Narrative   Recovering alcoholic      Minister     Past Surgical History  Procedure Laterality Date  . Hip surgery  12/29/09    R THR  (Nitka)  . Appendectomy    . Hand surgery    . Cervical  laminectomy    . Cervical fusion    . Hip surgery      bilateral  . Total knee arthroplasty  01/2008    left knee  . Total hip arthroplasty Left 09/10/2012    Procedure: REMOVAL OF HARDWARE LEFT PROXIMAL THIGH/FEMUR AND LEFT TOTAL HIP ARTHROPLASTY;  Surgeon: Jessy Oto, MD;  Location: [facility];  Service: Orthopedics;  Laterality: Left;  . Hardware removal Left 09/10/2012    Procedure: HARDWARE REMOVAL;  Surgeon: Jessy Oto, MD;  Location: [facility];  Service: Orthopedics;  Laterality: Left;  . Colonoscopy      Family History  Problem Relation Age of Onset  . Cancer Mother     lung  . Crohn's disease Father   . Alcohol abuse Brother   . Colon polyps Brother   . Colon cancer Neg Hx     Allergies  Allergen Reactions  . Codeine     REACTION: itch  . Morphine And Related Nausea And Vomiting  . Naproxen     REACTION: anaphylaxis    Current Outpatient Prescriptions on File Prior to Visit  Medication Sig Dispense Refill  .  atorvastatin (LIPITOR) 40 MG tablet Take 1 tablet by mouth  daily 90 tablet 1  . diclofenac sodium (VOLTAREN) 1 % GEL Apply topically.      Marland Kitchen HYDROcodone-acetaminophen (NORCO) 7.5-325 MG per tablet Take 1-2 tablets by mouth every 4 (four) hours as needed for pain (maximum of 8 total pain medication and tylenol tablets per day.). 60 tablet 1  . Insulin Detemir (LEVEMIR FLEXTOUCH) 100 UNIT/ML Pen Inject 50 Units into the skin daily at 10 pm. 10 pen 4  . insulin lispro (HUMALOG KWIKPEN) 100 UNIT/ML KiwkPen 20 units prior to breakfast and lunch and 28  units prior to your evening meal;   If blood sugar is greater than 180, add an additional 4 units 15 pen 3  . Insulin Pen Needle (NOVOTWIST) 32G X 5 MM MISC 1 each by Does not apply route as directed. 100 each 5  . lidocaine (LIDODERM) 5 % Place 1 patch onto the skin as needed.     Marland Kitchen lisinopril-hydrochlorothiazide (PRINZIDE,ZESTORETIC) 20-25 MG per tablet Take 1 tablet by mouth  daily 90 tablet 1  . metFORMIN (GLUCOPHAGE)  1000 MG tablet TAKE 1 TABLET BY MOUTH  TWICE A DAY WITH A MEAL 180 tablet 1  . methadone (DOLOPHINE) 10 MG tablet Take one to four tablets daily as needed 90 tablet 0  . methocarbamol (ROBAXIN) 750 MG tablet Take 1 tablet by mouth  every 6 hours as needed 90 tablet 1  . Multiple Vitamins-Minerals (MULTIVITAMIN WITH MINERALS) tablet Take 1 tablet by mouth daily.    Marland Kitchen Nystatin (NYAMYC) 100000 UNIT/GM POWD Apply 1 Bottle topically as needed.     Marland Kitchen omeprazole (PRILOSEC) 20 MG capsule Take 1 capsule (20 mg total) by mouth daily. 90 capsule 3  . ONE TOUCH ULTRA TEST test strip Check blood sugar daily and as needed 200 each 3  . promethazine (PHENERGAN) 25 MG tablet Take 1 tablet by mouth  every 6 hours as needed for nausea or vomiting 90 tablet 0  . Sildenafil Citrate (VIAGRA PO) Take 1 tablet by mouth as needed.     No current facility-administered medications on file prior to visit.    BP 120/80 mmHg  Pulse 68  Temp(Src) 98.4 F (36.9 C) (Oral)  Wt 281 lb (127.461 kg)     Review of Systems  Constitutional: Negative for fever, chills, appetite change and fatigue.  HENT: Negative for congestion, dental problem, ear pain, hearing loss, sore throat, tinnitus, trouble swallowing and voice change.   Eyes: Negative for pain, discharge and visual disturbance.  Respiratory: Negative for cough, chest tightness, wheezing and stridor.   Cardiovascular: Negative for chest pain, palpitations and leg swelling.  Gastrointestinal: Negative for nausea, vomiting, abdominal pain, diarrhea, constipation, blood in stool and abdominal distention.  Genitourinary: Negative for urgency, hematuria, flank pain, discharge, difficulty urinating and genital sores.  Musculoskeletal: Positive for back pain and arthralgias. Negative for myalgias, joint swelling, gait problem and neck stiffness.  Skin: Negative for rash.  Neurological: Negative for dizziness, syncope, speech difficulty, weakness, numbness and headaches.    Hematological: Negative for adenopathy. Does not bruise/bleed easily.  Psychiatric/Behavioral: Negative for behavioral problems and dysphoric mood. The patient is not nervous/anxious.        Objective:   Physical Exam  Constitutional: He appears well-developed and well-nourished. No distress.  Blood pressure 110/76 Weight 281  Cardiovascular: Normal rate and regular rhythm.   Pulmonary/Chest: Effort normal and breath sounds normal.  Musculoskeletal: He exhibits no edema.  Assessment & Plan:   Diabetes, improved.  We'll increase basal insulin to 56 units.  Otherwise no change Hypertension, controlled Obesity.  Improved  Recheck 3 months

## 2014-08-12 NOTE — Telephone Encounter (Signed)
Pt states he was seen in the office today and while reviewing his AVS he saw a diagnosis of HEPATOMEGALY listed.  Pt states he has never been told he had this diagnosis and questions if this is an error.  Pt requesting call back to discuss with nurse.

## 2014-08-12 NOTE — Progress Notes (Signed)
Pre visit review using our clinic review tool, if applicable. No additional management support is needed unless otherwise documented below in the visit note. Lab Results  Component Value Date   HGBA1C 8.5* 07/01/2014   HGBA1C 9.5* 01/02/2014   HGBA1C 10.1* 09/16/2013   Lab Results  Component Value Date   MICROALBUR 7.1* 09/16/2013   CREATININE 0.9 09/16/2013

## 2014-08-12 NOTE — Patient Instructions (Signed)
Limit your sodium (Salt) intake  Please check your blood pressure on a regular basis.  If it is consistently greater than 150/90, please make an office appointment.   Please check your hemoglobin A1c every 3 months  You need to lose weight.  Consider a lower calorie diet and regular exercise. 

## 2014-08-13 DIAGNOSIS — D229 Melanocytic nevi, unspecified: Secondary | ICD-10-CM | POA: Diagnosis not present

## 2014-08-13 DIAGNOSIS — Z85828 Personal history of other malignant neoplasm of skin: Secondary | ICD-10-CM | POA: Diagnosis not present

## 2014-08-13 DIAGNOSIS — L821 Other seborrheic keratosis: Secondary | ICD-10-CM | POA: Diagnosis not present

## 2014-08-13 DIAGNOSIS — D1801 Hemangioma of skin and subcutaneous tissue: Secondary | ICD-10-CM | POA: Diagnosis not present

## 2014-08-13 NOTE — Telephone Encounter (Signed)
Patient is aware 

## 2014-08-13 NOTE — Telephone Encounter (Signed)
Notify patient that this diagnosis was made in the past based on a physical exam finding; recent imaging studies have revealed a normal liver; notify patient that we will remove this diagnosis from his problem list

## 2014-09-03 DIAGNOSIS — M25551 Pain in right hip: Secondary | ICD-10-CM | POA: Diagnosis not present

## 2014-09-03 DIAGNOSIS — M5116 Intervertebral disc disorders with radiculopathy, lumbar region: Secondary | ICD-10-CM | POA: Diagnosis not present

## 2014-09-03 DIAGNOSIS — M4806 Spinal stenosis, lumbar region: Secondary | ICD-10-CM | POA: Diagnosis not present

## 2014-09-22 ENCOUNTER — Other Ambulatory Visit (INDEPENDENT_AMBULATORY_CARE_PROVIDER_SITE_OTHER): Payer: 59

## 2014-09-22 DIAGNOSIS — E785 Hyperlipidemia, unspecified: Secondary | ICD-10-CM | POA: Diagnosis not present

## 2014-09-22 DIAGNOSIS — R7989 Other specified abnormal findings of blood chemistry: Secondary | ICD-10-CM | POA: Diagnosis not present

## 2014-09-22 DIAGNOSIS — Z125 Encounter for screening for malignant neoplasm of prostate: Secondary | ICD-10-CM | POA: Diagnosis not present

## 2014-09-22 DIAGNOSIS — Z0000 Encounter for general adult medical examination without abnormal findings: Secondary | ICD-10-CM

## 2014-09-22 LAB — POCT URINALYSIS DIPSTICK
Bilirubin, UA: NEGATIVE
Blood, UA: NEGATIVE
Glucose, UA: NEGATIVE
Ketones, UA: NEGATIVE
Leukocytes, UA: NEGATIVE
Nitrite, UA: NEGATIVE
Protein, UA: NEGATIVE
Spec Grav, UA: 1.015
Urobilinogen, UA: 0.2
pH, UA: 6.5

## 2014-09-22 LAB — CBC WITH DIFFERENTIAL/PLATELET
Basophils Absolute: 0 10*3/uL (ref 0.0–0.1)
Basophils Relative: 0.5 % (ref 0.0–3.0)
Eosinophils Absolute: 0.2 10*3/uL (ref 0.0–0.7)
Eosinophils Relative: 1.8 % (ref 0.0–5.0)
HCT: 48 % (ref 39.0–52.0)
Hemoglobin: 15.9 g/dL (ref 13.0–17.0)
Lymphocytes Relative: 35.1 % (ref 12.0–46.0)
Lymphs Abs: 3.1 10*3/uL (ref 0.7–4.0)
MCHC: 33.2 g/dL (ref 30.0–36.0)
MCV: 84.7 fl (ref 78.0–100.0)
Monocytes Absolute: 0.7 10*3/uL (ref 0.1–1.0)
Monocytes Relative: 7.5 % (ref 3.0–12.0)
Neutro Abs: 4.9 10*3/uL (ref 1.4–7.7)
Neutrophils Relative %: 55.1 % (ref 43.0–77.0)
Platelets: 213 10*3/uL (ref 150.0–400.0)
RBC: 5.66 Mil/uL (ref 4.22–5.81)
RDW: 14.7 % (ref 11.5–15.5)
WBC: 8.8 10*3/uL (ref 4.0–10.5)

## 2014-09-22 LAB — BASIC METABOLIC PANEL
BUN: 15 mg/dL (ref 6–23)
CO2: 28 mEq/L (ref 19–32)
Calcium: 9.3 mg/dL (ref 8.4–10.5)
Chloride: 96 mEq/L (ref 96–112)
Creatinine, Ser: 0.72 mg/dL (ref 0.40–1.50)
GFR: 117.93 mL/min (ref >60.00)
Glucose, Bld: 196 mg/dL — ABNORMAL HIGH (ref 70–99)
Potassium: 4.4 mEq/L (ref 3.5–5.1)
Sodium: 136 mEq/L (ref 135–145)

## 2014-09-22 LAB — MICROALBUMIN / CREATININE URINE RATIO
Creatinine,U: 101.6 mg/dL
Microalb Creat Ratio: 0.7 mg/g (ref 0.0–30.0)
Microalb, Ur: 0.7 mg/dL (ref 0.0–1.9)

## 2014-09-22 LAB — LIPID PANEL
Cholesterol: 181 mg/dL (ref 0–200)
HDL: 31 mg/dL — ABNORMAL LOW (ref >39.00)
NonHDL: 149.8
Total CHOL/HDL Ratio: 6
Triglycerides: 329 mg/dL — ABNORMAL HIGH (ref 0.0–149.0)
VLDL: 65.8 mg/dL — ABNORMAL HIGH (ref 0.0–40.0)

## 2014-09-22 LAB — HEPATIC FUNCTION PANEL
ALT: 38 U/L (ref 0–53)
AST: 34 U/L (ref 0–37)
Albumin: 4.3 g/dL (ref 3.5–5.2)
Alkaline Phosphatase: 52 U/L (ref 39–117)
Bilirubin, Direct: 0.2 mg/dL (ref 0.0–0.3)
Total Bilirubin: 0.9 mg/dL (ref 0.2–1.2)
Total Protein: 7.4 g/dL (ref 6.0–8.3)

## 2014-09-22 LAB — LDL CHOLESTEROL, DIRECT: Direct LDL: 112 mg/dL

## 2014-09-22 LAB — PSA: PSA: 0.19 ng/mL (ref 0.10–4.00)

## 2014-09-22 LAB — HEMOGLOBIN A1C: Hgb A1c MFr Bld: 8 % — ABNORMAL HIGH (ref 4.6–6.5)

## 2014-09-22 LAB — TSH: TSH: 3.01 u[IU]/mL (ref 0.35–4.50)

## 2014-09-24 ENCOUNTER — Other Ambulatory Visit: Payer: Self-pay | Admitting: Internal Medicine

## 2014-09-26 ENCOUNTER — Other Ambulatory Visit: Payer: Self-pay | Admitting: *Deleted

## 2014-09-26 MED ORDER — INSULIN PEN NEEDLE 32G X 5 MM MISC: Status: DC

## 2014-09-29 ENCOUNTER — Encounter: Payer: Self-pay | Admitting: Internal Medicine

## 2014-09-29 ENCOUNTER — Ambulatory Visit (INDEPENDENT_AMBULATORY_CARE_PROVIDER_SITE_OTHER): Payer: 59 | Admitting: Internal Medicine

## 2014-09-29 ENCOUNTER — Other Ambulatory Visit: Payer: Self-pay | Admitting: Internal Medicine

## 2014-09-29 VITALS — BP 124/70 | HR 90 | Temp 98.4°F | Resp 20 | Ht 72.0 in | Wt 285.0 lb

## 2014-09-29 DIAGNOSIS — Z86010 Personal history of colonic polyps: Secondary | ICD-10-CM | POA: Diagnosis not present

## 2014-09-29 DIAGNOSIS — M150 Primary generalized (osteo)arthritis: Secondary | ICD-10-CM

## 2014-09-29 DIAGNOSIS — Z0000 Encounter for general adult medical examination without abnormal findings: Secondary | ICD-10-CM | POA: Diagnosis not present

## 2014-09-29 DIAGNOSIS — I10 Essential (primary) hypertension: Secondary | ICD-10-CM

## 2014-09-29 DIAGNOSIS — Z23 Encounter for immunization: Secondary | ICD-10-CM | POA: Diagnosis not present

## 2014-09-29 DIAGNOSIS — E119 Type 2 diabetes mellitus without complications: Secondary | ICD-10-CM | POA: Diagnosis not present

## 2014-09-29 DIAGNOSIS — E785 Hyperlipidemia, unspecified: Secondary | ICD-10-CM

## 2014-09-29 DIAGNOSIS — M159 Polyosteoarthritis, unspecified: Secondary | ICD-10-CM

## 2014-09-29 DIAGNOSIS — M8949 Other hypertrophic osteoarthropathy, multiple sites: Secondary | ICD-10-CM

## 2014-09-29 MED ORDER — ONETOUCH ULTRA SYSTEM W/DEVICE KIT
1.0000 | PACK | Freq: Once | Status: DC
Start: 2014-09-29 — End: 2020-06-02

## 2014-09-29 MED ORDER — METHADONE HCL 10 MG PO TABS: ORAL_TABLET | ORAL | Status: DC

## 2014-09-29 MED ORDER — INSULIN DETEMIR 100 UNIT/ML FLEXPEN: 60.0000 [IU] | PEN_INJECTOR | Freq: Every day | SUBCUTANEOUS | Status: DC

## 2014-09-29 NOTE — Patient Instructions (Signed)
Limit your sodium (Salt) intake    It is important that you exercise regularly, at least 20 minutes 3 to 4 times per week.  If you develop chest pain or shortness of breath seek  medical attention.  You need to lose weight.  Consider a lower calorie diet and regular exercise.   Please check your hemoglobin A1c every 3 months  Low contrast chest CT as discussed

## 2014-09-29 NOTE — Progress Notes (Signed)
Subjective:    Patient ID: [name], male    DOB: May 07, 1955, 59 y.o.   MRN: [id number]  HPI  Lab Results  Component Value Date   HGBA1C 8.0* 09/22/2014   Wt Readings from Last 3 Encounters:  09/29/14 285 lb (129.275 kg)  08/12/14 281 lb (127.461 kg)  07/01/14 287 lb (130.90 kg)     59 year old patient who is seen today for a wellness exam  He has a history of morbid obesity and diabetes.  He is now on basal bolus insulin with fasting blood sugars in the 160 to 170 range.  He has had an eye examination 6 months ago   He has chronic low back pain and osteoarthritis  He has a history of colonic polyps her last colonoscopy was March 2016   He is a remote tobacco user and is concerned about lung cancer risk  Past Medical History  Diagnosis Date  . History of hip replacement, total   . Diabetes mellitus   . Hypertension   . Hyperlipidemia   . GERD (gastroesophageal reflux disease)   . Obesity   . Chronic pain   . History of colonic polyps   . RBBB (right bundle branch block with left anterior fascicular block)   . 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 Left anterior hemiblock   . Adenomatous polyp   . Hx of anaphylactic shock   . Cataract     left eye for sure     Social History   Social History  . Marital Status: Married    Spouse Name: N/A  . Number of Children: N/A  . Years of Education: N/A   Occupational History  . Not on file.   Social History Main Topics  . Smoking status: Former Smoker    Quit date: 09/03/2013  . Smokeless tobacco: Former Systems developer    Types: Chew  . Alcohol Use: No     Comment: recovering alcoholic   . Drug Use: No  . Sexual Activity: Not on file   Other Topics Concern  . Not on file   Social History Narrative   Recovering alcoholic      Minister     Past Surgical History  Procedure Laterality Date  . Hip surgery  12/29/09    R THR  (Nitka)  . Appendectomy    . Hand surgery    . Cervical  laminectomy    . Cervical fusion    . Hip surgery      bilateral  . Total knee arthroplasty  01/2008    left knee  . Total hip arthroplasty Left 09/10/2012    Procedure: REMOVAL OF HARDWARE LEFT PROXIMAL THIGH/FEMUR AND LEFT TOTAL HIP ARTHROPLASTY;  Surgeon: Jessy Oto, MD;  Location: [facility];  Service: Orthopedics;  Laterality: Left;  . Hardware removal Left 09/10/2012    Procedure: HARDWARE REMOVAL;  Surgeon: Jessy Oto, MD;  Location: [facility];  Service: Orthopedics;  Laterality: Left;  . Colonoscopy      Family History  Problem Relation Age of Onset  . Cancer Mother     lung  . Crohn's disease Father   . Alcohol abuse Brother   . Colon polyps Brother   . Colon cancer Neg Hx     Allergies  Allergen Reactions  . Codeine     REACTION: itch  . Morphine And Related Nausea And Vomiting  . Naproxen     REACTION: anaphylaxis  Current Outpatient Prescriptions on File Prior to Visit  Medication Sig Dispense Refill  . atorvastatin (LIPITOR) 40 MG tablet Take 1 tablet by mouth  daily 90 tablet 1  . diclofenac sodium (VOLTAREN) 1 % GEL Apply topically.      Marland Kitchen HUMALOG KWIKPEN 100 UNIT/ML KiwkPen Inject 14 units prior to  breakfast and lunch and 20  units prior to PM meal.  If BS&gt;180, add an additional 4 units. (Patient taking differently: Inject 20 units prior to  breakfast and lunch and 28  units prior to PM meal.  If BS&gt;180, add an additional 4 units.) 60 pen 2  . HYDROcodone-acetaminophen (NORCO) 7.5-325 MG per tablet Take 1-2 tablets by mouth every 4 (four) hours as needed for pain (maximum of 8 total pain medication and tylenol tablets per day.). 60 tablet 1  . Insulin Detemir (LEVEMIR FLEXTOUCH) 100 UNIT/ML Pen Inject 56 Units into the skin daily at 10 pm. 10 pen 4  . Insulin Pen Needle (NOVOTWIST) 32G X 5 MM MISC USE TO CHECK BLOOD SUGAR DAILY AND PRN (Patient taking differently: USE TO CHECK BLOOD SUGAR TWICE A DAY AND PRN) 100 each 4  . lidocaine (LIDODERM) 5 % Place 1  patch onto the skin as needed.     Marland Kitchen lisinopril-hydrochlorothiazide (PRINZIDE,ZESTORETIC) 20-25 MG per tablet Take 1 tablet by mouth  daily 90 tablet 1  . metFORMIN (GLUCOPHAGE) 1000 MG tablet TAKE 1 TABLET BY MOUTH  TWICE A DAY WITH A MEAL 180 tablet 1  . methocarbamol (ROBAXIN) 750 MG tablet Take 1 tablet by mouth  every 6 hours as needed 90 tablet 1  . Multiple Vitamins-Minerals (MULTIVITAMIN WITH MINERALS) tablet Take 1 tablet by mouth daily.    Marland Kitchen Nystatin (NYAMYC) 100000 UNIT/GM POWD Apply 1 Bottle topically as needed.     Marland Kitchen omeprazole (PRILOSEC) 20 MG capsule Take 1 capsule by mouth  daily 90 capsule 1  . ONE TOUCH ULTRA TEST test strip Check blood sugar daily and as needed 200 each 3  . promethazine (PHENERGAN) 25 MG tablet Take 1 tablet by mouth  every 6 hours as needed for nausea or vomiting 90 tablet 1  . Sildenafil Citrate (VIAGRA PO) Take 1 tablet by mouth as needed.     No current facility-administered medications on file prior to visit.    BP 124/70 mmHg  Pulse 90  Temp(Src) 98.4 F (36.9 C) (Oral)  Resp 20  Ht 6' (1.829 m)  Wt 285 lb (129.275 kg)  BMI 38.64 kg/m2  SpO2 97%   1. Risk factors, based on past  M,S,F history.  Current vascular risk factors include hypertension, dyslipidemia and type 2 diabetes.  He has had nuclear stress test in the past  2.  Physical activities: sedentary due to his obesity and arthritis  3.  Depression/mood: no history of major depression or mood disorder  4.  Hearing: no deficits  5.  ADL's: independent  6.  Fall risk: increased slightly due to obesity and arthritis  7.  Home safety: no problems identified  8.  Height weight, and visual acuity; height and weight stable no change in visual acuity is see ophthalmology about 6 months ago.  History of mild cataracts  9.  Counseling: heart healthy diet, weight loss exercise all encouraged  10. Lab orders based on risk factors: laboratory profile including lipid panel reviewed  11.  Referral : not appropriate at this time  12. Care plan: lifestyle issues discussed  13. Cognitive assessment:  Alert and  oriented with normal affect.  No cognitive dysfunction  14. Screening: Patient provided with a written and personalized 5-10 year screening schedule in the AVS.   Will have colonoscopies at five-year intervals.  Due to his history colonic polyps.  A low contrast chest CT will be ordered.   Yearly eye examination encouraged  15. Provider List Update:  Includes primary care medicine ophthalmology GI.    Review of Systems  Constitutional: Negative for fever, chills, appetite change and fatigue.  HENT: Negative for congestion, dental problem, ear pain, hearing loss, sore throat, tinnitus, trouble swallowing and voice change.   Eyes: Negative for pain, discharge and visual disturbance.  Respiratory: Negative for cough, chest tightness, wheezing and stridor.   Cardiovascular: Negative for chest pain, palpitations and leg swelling.  Gastrointestinal: Negative for nausea, vomiting, abdominal pain, diarrhea, constipation, blood in stool and abdominal distention.  Genitourinary: Negative for urgency, hematuria, flank pain, discharge, difficulty urinating and genital sores.  Musculoskeletal: Positive for back pain, arthralgias and neck pain. Negative for myalgias, joint swelling, gait problem and neck stiffness.  Skin: Negative for rash.  Neurological: Negative for dizziness, syncope, speech difficulty, weakness, numbness and headaches.  Hematological: Negative for adenopathy. Does not bruise/bleed easily.  Psychiatric/Behavioral: Negative for behavioral problems and dysphoric mood. The patient is not nervous/anxious.        Objective:   Physical Exam  Constitutional: He appears well-developed and well-nourished.  Blood pressure low normal Morbidly obese  HENT:  Head: Normocephalic and atraumatic.  Right Ear: External ear normal.  Left Ear: External ear normal.  Nose:  Nose normal.  Mouth/Throat: Oropharynx is clear and moist.  Eyes: Conjunctivae and EOM are normal. Pupils are equal, round, and reactive to light. No scleral icterus.  Neck: Normal range of motion. Neck supple. No JVD present. No thyromegaly present.  Cardiovascular: Regular rhythm, normal heart sounds and intact distal pulses.  Exam reveals no gallop and no friction rub.   No murmur heard. Pulmonary/Chest: Effort normal and breath sounds normal. He exhibits no tenderness.  Abdominal: Soft. Bowel sounds are normal. He exhibits no distension and no mass. There is no tenderness.  Genitourinary: Prostate normal and penis normal.  bilat inguinal hernias  Musculoskeletal: Normal range of motion. He exhibits no edema or tenderness.  Lymphadenopathy:    He has no cervical adenopathy.  Neurological: He is alert. He has normal reflexes. No cranial nerve deficit. Coordination normal.  Skin: Skin is warm and dry. No rash noted.  Surgical scars anterior neck, right mid abdominal area and right hip region  Psychiatric: He has a normal mood and affect. His behavior is normal.          Assessment & Plan:   Preventive health examination Diabetes mellitus.  Will increase basal insulin from 56 units to 60 units.  Exercise weight loss.  All encouraged Essential hypertension, stable Morbid obesity History colonic polyps.  Colonoscopy was at five-year intervals History tobacco use.  Will schedule a low intensity chest CT  Return in 3 months for follow-up

## 2014-09-29 NOTE — Progress Notes (Signed)
Pre visit review using our clinic review tool, if applicable. No additional management support is needed unless otherwise documented below in the visit note. 

## 2014-09-30 ENCOUNTER — Other Ambulatory Visit: Payer: Self-pay | Admitting: *Deleted

## 2014-10-01 ENCOUNTER — Other Ambulatory Visit: Payer: Self-pay | Admitting: *Deleted

## 2014-10-01 MED ORDER — INSULIN PEN NEEDLE 32G X 5 MM MISC: Status: DC

## 2014-10-03 ENCOUNTER — Telehealth: Payer: Self-pay | Admitting: Acute Care

## 2014-10-03 NOTE — Telephone Encounter (Signed)
Per referral from  Dr. Burnice Logan I have called to schedule this patient for a screening.There was no answer. I have left my contact information requesting that he call me back to schedule.

## 2014-10-06 ENCOUNTER — Telehealth: Payer: Self-pay | Admitting: Acute Care

## 2014-10-06 NOTE — Telephone Encounter (Signed)
Pt. Scheduled for his screening CT scan on 10/14/2014 at 10:30 am for [facility] and  11:30 am for scan. Pt. Verbalized time and location of both the scan and the appointment. He has my contact information in the event he has any further questions.

## 2014-10-07 ENCOUNTER — Other Ambulatory Visit: Payer: Self-pay | Admitting: Acute Care

## 2014-10-07 DIAGNOSIS — Z87891 Personal history of nicotine dependence: Secondary | ICD-10-CM

## 2014-10-14 ENCOUNTER — Ambulatory Visit (INDEPENDENT_AMBULATORY_CARE_PROVIDER_SITE_OTHER)
Admission: RE | Admit: 2014-10-14 | Discharge: 2014-10-14 | Disposition: A | Discharge status: Home / Self Care | Payer: 59 | Source: Ambulatory Visit | Attending: Acute Care | Admitting: Acute Care

## 2014-10-14 ENCOUNTER — Encounter: Payer: Self-pay | Admitting: Acute Care

## 2014-10-14 ENCOUNTER — Ambulatory Visit (INDEPENDENT_AMBULATORY_CARE_PROVIDER_SITE_OTHER): Payer: 59 | Admitting: Acute Care

## 2014-10-14 DIAGNOSIS — Z87891 Personal history of nicotine dependence: Secondary | ICD-10-CM

## 2014-10-14 DIAGNOSIS — F17210 Nicotine dependence, cigarettes, uncomplicated: Secondary | ICD-10-CM | POA: Diagnosis not present

## 2014-10-14 NOTE — Progress Notes (Signed)
Shared Decision Making Visit Lung Cancer Screening Program [phone])   Eligibility:  Age 59 y.o.  Pack Years Smoking History Calculation 63 pack years (# packs/per year x # years smoked)  Recent History of coughing up blood  no  Unexplained weight loss? no ( >Than 15 pounds within the last 6 months )  Prior History Lung / other cancer no (Diagnosis within the last 5 years already requiring surveillance chest CT Scans).  Smoking Status Former Smoker  Former Smokers: Years since quit: 1 year  Quit Date: 09/03/2013  Visit Components:  Discussion included one or more decision making aids. yes  Discussion included risk/benefits of screening. yes  Discussion included potential follow up diagnostic testing for abnormal scans. yes  Discussion included meaning and risk of over diagnosis. yes  Discussion included meaning and risk of False Positives. yes  Discussion included meaning of total radiation exposure. yes  Counseling Included:  Importance of adherence to annual lung cancer LDCT screening. yes  Impact of comorbidities on ability to participate in the program. yes  Ability and willingness to under diagnostic treatment. yes  Smoking Cessation Counseling:  Current Smokers: NA  Discussed importance of smoking cessation. Former Smoker/ NA  Information about tobacco cessation classes and interventions provided to patient.NA  Patient provided with "ticket" for LDCT Scan.NA  Symptomatic Patient. no  Counseling:NA  Diagnosis Code: Tobacco Use Z72.0  Asymptomatic Patient yes  Counseling NA  Former Smokers:   Discussed the importance of maintaining cigarette abstinence. yes  Diagnosis Code: Personal History of Nicotine Dependence. M22.633  Information about tobacco cessation classes and interventions provided to patient. Yes  Patient provided with "ticket" for LDCT Scan. yes  Written Order for Lung Cancer Screening with LDCT placed in Epic. Yes (CT Chest Lung  Cancer Screening Low Dose W/O CM) [id number] Z12.2-Screening of respiratory organs Z87.891-Personal history of nicotine dependence   I spent 15 minutes of face to face time with Mr. Scalisi discussing the risks and benefits of lung cancer screening. We viewed a power point together that explains the above noted topics, stopping at intervals to allow time for questions to be asked and answered to ensure understanding. We discussed that by quitting smoking 1 year ago that he had taken the single most powerful action he could take to decrease his risk of lung cancer. He quit cold [place]. He has a strong faith and he states that it helped him become smoke free. I have told him that if he ever has the desire to smoke again to call me and we will help him remain smoke free in any way we can. We discussed the time and location of his CT scan, and that I will call him within 48 hours with the results of the scan.He has my contact information and a copy of the power point we viewed together to refer to in the event he has any additional questions in the future. He had no further questions for me upon leaving the office and he verbalized understanding of all of the above information .  Magdalen Spatz, NP

## 2014-10-15 ENCOUNTER — Telehealth: Payer: Self-pay | Admitting: Acute Care

## 2014-10-15 NOTE — Telephone Encounter (Signed)
I called [name] with his scan results. I have explained that his scan was read as a Lung RADS 1, which is a negative scan. Nodules that are benign. I also explained that the recommendation is for a follow up scan in 12 months. I shared with [name] that there was an incidental finding of atherosclerosis, for which he has seen a cardiologist for in the past. I will share this information with Dr. Burnice Logan and defer to him to determine follow up as he knows the patient's cardiac history. I told [name] that we will call mid Sept. Of 2017 to schedule his next screening scan. He verbalized understanding of all of the above and has my contact information should he have any further questions.

## 2014-10-29 DIAGNOSIS — M4806 Spinal stenosis, lumbar region: Secondary | ICD-10-CM | POA: Diagnosis not present

## 2014-10-29 DIAGNOSIS — M19021 Primary osteoarthritis, right elbow: Secondary | ICD-10-CM | POA: Diagnosis not present

## 2014-10-29 DIAGNOSIS — M25512 Pain in left shoulder: Secondary | ICD-10-CM | POA: Diagnosis not present

## 2014-11-04 ENCOUNTER — Other Ambulatory Visit: Payer: Self-pay | Admitting: Specialist

## 2014-11-04 DIAGNOSIS — M25512 Pain in left shoulder: Secondary | ICD-10-CM

## 2014-11-05 ENCOUNTER — Other Ambulatory Visit: Payer: Self-pay | Admitting: Specialist

## 2014-11-05 DIAGNOSIS — M25512 Pain in left shoulder: Secondary | ICD-10-CM

## 2014-11-14 ENCOUNTER — Ambulatory Visit
Admission: RE | Admit: 2014-11-14 | Discharge: 2014-11-14 | Disposition: A | Discharge status: Home / Self Care | Payer: 59 | Source: Ambulatory Visit | Attending: Specialist | Admitting: Specialist

## 2014-11-14 DIAGNOSIS — M25512 Pain in left shoulder: Secondary | ICD-10-CM

## 2014-11-14 DIAGNOSIS — Z01818 Encounter for other preprocedural examination: Secondary | ICD-10-CM | POA: Diagnosis not present

## 2014-11-14 DIAGNOSIS — M75102 Unspecified rotator cuff tear or rupture of left shoulder, not specified as traumatic: Secondary | ICD-10-CM | POA: Diagnosis not present

## 2014-11-20 DIAGNOSIS — M75112 Incomplete rotator cuff tear or rupture of left shoulder, not specified as traumatic: Secondary | ICD-10-CM | POA: Diagnosis not present

## 2014-12-08 DIAGNOSIS — M19012 Primary osteoarthritis, left shoulder: Secondary | ICD-10-CM | POA: Diagnosis not present

## 2014-12-15 ENCOUNTER — Other Ambulatory Visit: Payer: Self-pay | Admitting: Internal Medicine

## 2014-12-23 ENCOUNTER — Other Ambulatory Visit: Payer: Self-pay | Admitting: Internal Medicine

## 2014-12-23 MED ORDER — INSULIN PEN NEEDLE 32G X 5 MM MISC: Status: DC

## 2014-12-30 ENCOUNTER — Telehealth: Payer: Self-pay | Admitting: Internal Medicine

## 2014-12-30 MED ORDER — METHADONE HCL 10 MG PO TABS: ORAL_TABLET | ORAL | Status: DC

## 2014-12-30 MED ORDER — OMEPRAZOLE 20 MG PO CPDR: DELAYED_RELEASE_CAPSULE | ORAL | Status: DC

## 2014-12-30 NOTE — Telephone Encounter (Signed)
Pt's wife Arville Go notified Rx ready for pickup. Rx printed and signed by Dr. Elease Hashimoto.

## 2014-12-30 NOTE — Telephone Encounter (Signed)
Pt needs new rx methadone °

## 2015-01-09 ENCOUNTER — Ambulatory Visit (INDEPENDENT_AMBULATORY_CARE_PROVIDER_SITE_OTHER): Payer: 59 | Admitting: Internal Medicine

## 2015-01-09 ENCOUNTER — Encounter: Payer: Self-pay | Admitting: Internal Medicine

## 2015-01-09 VITALS — BP 126/78 | HR 82 | Temp 98.2°F | Resp 20 | Ht 72.0 in | Wt 285.0 lb

## 2015-01-09 DIAGNOSIS — G8929 Other chronic pain: Secondary | ICD-10-CM

## 2015-01-09 DIAGNOSIS — E785 Hyperlipidemia, unspecified: Secondary | ICD-10-CM

## 2015-01-09 DIAGNOSIS — M549 Dorsalgia, unspecified: Secondary | ICD-10-CM

## 2015-01-09 DIAGNOSIS — M8949 Other hypertrophic osteoarthropathy, multiple sites: Secondary | ICD-10-CM

## 2015-01-09 DIAGNOSIS — M159 Polyosteoarthritis, unspecified: Secondary | ICD-10-CM

## 2015-01-09 DIAGNOSIS — M150 Primary generalized (osteo)arthritis: Secondary | ICD-10-CM | POA: Diagnosis not present

## 2015-01-09 DIAGNOSIS — E119 Type 2 diabetes mellitus without complications: Secondary | ICD-10-CM

## 2015-01-09 LAB — HEMOGLOBIN A1C: Hgb A1c MFr Bld: 7.5 % — ABNORMAL HIGH (ref 4.6–6.5)

## 2015-01-09 MED ORDER — OMEPRAZOLE 20 MG PO CPDR: DELAYED_RELEASE_CAPSULE | ORAL | Status: DC

## 2015-01-09 MED ORDER — INSULIN LISPRO 100 UNIT/ML (KWIKPEN): PEN_INJECTOR | SUBCUTANEOUS | Status: DC

## 2015-01-09 NOTE — Progress Notes (Signed)
Subjective:    Patient ID: [name], male    DOB: 12-19-1955, 60 y.o.   MRN: [id number]  HPI  Lab Results  Component Value Date   HGBA1C 8.0* 09/22/2014    BP Readings from Last 3 Encounters:  01/09/15 126/78  09/29/14 124/70  08/12/14 120/80    Wt Readings from Last 3 Encounters:  01/09/15 285 lb (129.275 kg)  09/29/14 285 lb (129.275 kg)  08/12/14 281 lb (127.52 kg)    60 year old patient seen today for follow-up of multiple medical problems.  He is on basal bolus insulin for diabetes mellitus.  His last hemoglobin A1c was 8.0.  He remains also on metformin therapy.  He feels his glycemic control has been much improved No improvement in weight, however  He has a long history of tobacco use discontinued tobacco products about 2 years ago after 42 years of smoking.  Low intensity chest CT was negative for lung cancer, but did reveal some emphysematous changes.  The patient was quite concerned and this was discussed at length  The patient has a prior history of alcoholism as well as drug abuse.  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  He has chronic low back pain and he feels this has been an important maintenance drug for him as well as when necessary hydrocodone. He states that he has not tolerated codeine or OxyContin in the past.  The poor risk-benefit profile of methadone.  Discussed at length.  He was made aware of regulations concerning prescribing of methadone  He has essential hypertension which has been stable  In spite of his chronic low back pain.  He at times remains quite active with activities such as cutting wood  Past Medical History  Diagnosis Date  . History of hip replacement, total   . Diabetes mellitus   . Hypertension   . Hyperlipidemia   . GERD (gastroesophageal reflux disease)   . Obesity   . Chronic pain   . History of colonic polyps   . RBBB (right bundle branch block with left  anterior fascicular block)   . 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 Left anterior hemiblock   . Adenomatous polyp   . Hx of anaphylactic shock   . Cataract     left eye for sure     Social History   Social History  . Marital Status: Married    Spouse Name: N/A  . Number of Children: N/A  . Years of Education: N/A   Occupational History  . Not on file.   Social History Main Topics  . Smoking status: Former Smoker -- 1.50 packs/day for 42 years    Types: Cigarettes    Quit date: 09/03/2013  . Smokeless tobacco: Former Systems developer    Types: Chew     Comment: We discussed the need to remain smoke free and to call me for help if he has the desire to smoke again.  . Alcohol Use: No     Comment: recovering alcoholic   . Drug Use: No  . Sexual Activity: Not on file   Other Topics Concern  . Not on file   Social History Narrative   Recovering alcoholic      Minister     Past Surgical History  Procedure Laterality Date  . Hip surgery  12/29/09    R THR  (Nitka)  . Appendectomy    .  Hand surgery    . Cervical laminectomy    . Cervical fusion    . Hip surgery      bilateral  . Total knee arthroplasty  01/2008    left knee  . Total hip arthroplasty Left 09/10/2012    Procedure: REMOVAL OF HARDWARE LEFT PROXIMAL THIGH/FEMUR AND LEFT TOTAL HIP ARTHROPLASTY;  Surgeon: Jessy Oto, MD;  Location: [facility];  Service: Orthopedics;  Laterality: Left;  . Hardware removal Left 09/10/2012    Procedure: HARDWARE REMOVAL;  Surgeon: Jessy Oto, MD;  Location: [facility];  Service: Orthopedics;  Laterality: Left;  . Colonoscopy      Family History  Problem Relation Age of Onset  . Cancer Mother     lung  . Crohn's disease Father   . Alcohol abuse Brother   . Colon polyps Brother   . Colon cancer Neg Hx     Allergies  Allergen Reactions  . Codeine     REACTION: itch  . Morphine And Related Nausea And Vomiting  . Naproxen     REACTION: anaphylaxis    Current  Outpatient Prescriptions on File Prior to Visit  Medication Sig Dispense Refill  . atorvastatin (LIPITOR) 40 MG tablet Take 1 tablet by mouth  daily 90 tablet 2  . Blood Glucose Monitoring Suppl (ONE TOUCH ULTRA SYSTEM KIT) W/DEVICE KIT 1 kit by Does not apply route once. 1 each 0  . diclofenac sodium (VOLTAREN) 1 % GEL Apply topically.      Marland Kitchen HYDROcodone-acetaminophen (NORCO) 7.5-325 MG per tablet Take 1-2 tablets by mouth every 4 (four) hours as needed for pain (maximum of 8 total pain medication and tylenol tablets per day.). 60 tablet 1  . Insulin Detemir (LEVEMIR FLEXTOUCH) 100 UNIT/ML Pen Inject 60 Units into the skin daily at 10 pm. 10 pen 4  . Insulin Pen Needle (NOVOTWIST) 32G X 5 MM MISC USE TO INJECT INSULIN THREE TIMES A DAY 100 each 4  . lidocaine (LIDODERM) 5 % Place 1 patch onto the skin as needed.     Marland Kitchen lisinopril-hydrochlorothiazide (PRINZIDE,ZESTORETIC) 20-25 MG tablet Take 1 tablet by mouth  daily 90 tablet 2  . metFORMIN (GLUCOPHAGE) 1000 MG tablet Take 1 tablet by mouth  twice a day with meals 180 tablet 1  . methadone (DOLOPHINE) 10 MG tablet Take one to four tablets daily as needed 90 tablet 0  . methocarbamol (ROBAXIN) 750 MG tablet Take 1 tablet by mouth  every 6 hours as needed 90 tablet 1  . Multiple Vitamins-Minerals (MULTIVITAMIN WITH MINERALS) tablet Take 1 tablet by mouth daily.    Marland Kitchen Nystatin (NYAMYC) 100000 UNIT/GM POWD Apply 1 Bottle topically as needed.     . ONE TOUCH ULTRA TEST test strip Check blood sugar daily and as needed 200 each 3  . promethazine (PHENERGAN) 25 MG tablet Take 1 tablet by mouth  every 6 hours as needed for nausea or vomiting 180 tablet 2  . Sildenafil Citrate (VIAGRA PO) Take 1 tablet by mouth as needed.     No current facility-administered medications on file prior to visit.    BP 126/78 mmHg  Pulse 82  Temp(Src) 98.2 F (36.8 C) (Oral)  Resp 20  Ht 6' (1.829 m)  Wt 285 lb (129.275 kg)  BMI 38.64 kg/m2  SpO2 98%     Review  of Systems  Constitutional: Negative for fever, chills, appetite change and fatigue.  HENT: Negative for congestion, dental problem, ear pain, hearing loss, sore  throat, tinnitus, trouble swallowing and voice change.   Eyes: Negative for pain, discharge and visual disturbance.  Respiratory: Negative for cough, chest tightness, wheezing and stridor.   Cardiovascular: Negative for chest pain, palpitations and leg swelling.  Gastrointestinal: Negative for nausea, vomiting, abdominal pain, diarrhea, constipation, blood in stool and abdominal distention.  Genitourinary: Negative for urgency, hematuria, flank pain, discharge, difficulty urinating and genital sores.  Musculoskeletal: Positive for back pain and arthralgias. Negative for myalgias, joint swelling, gait problem and neck stiffness.  Skin: Negative for rash.  Neurological: Negative for dizziness, syncope, speech difficulty, weakness, numbness and headaches.  Hematological: Negative for adenopathy. Does not bruise/bleed easily.  Psychiatric/Behavioral: Negative for behavioral problems and dysphoric mood. The patient is not nervous/anxious.        Objective:   Physical Exam  Constitutional: He is oriented to person, place, and time. He appears well-developed.  Weight 285 Blood pressure well controlled  HENT:  Head: Normocephalic.  Right Ear: External ear normal.  Left Ear: External ear normal.  Eyes: Conjunctivae and EOM are normal.  Neck: Normal range of motion.  Cardiovascular: Normal rate and normal heart sounds.   Pulmonary/Chest: Breath sounds normal.  Abdominal: Bowel sounds are normal.  Musculoskeletal: Normal range of motion. He exhibits no edema or tenderness.  Neurological: He is alert and oriented to person, place, and time.  Psychiatric: He has a normal mood and affect. His behavior is normal.          Assessment & Plan:   Hypertension, well-controlled Type 2 diabetes.  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  Will decrease methadone to 10 mg every other day for 1 month, then further decreased to 10 mg every third day for an additional 4 weeks and then discontinue.  Continue when necessary hydrocodone  Reassessed.  3 months

## 2015-01-09 NOTE — Patient Instructions (Signed)
Please check your hemoglobin A1c every 3 months  Limit your sodium (Salt) intake    It is important that you exercise regularly, at least 20 minutes 3 to 4 times per week.  If you develop chest pain or shortness of breath seek  medical attention.  You need to lose weight.  Consider a lower calorie diet and regular exercise.  Decrease methadone to 1 tablet every other day for 4 weeks, then decrease methadone to 1 tablet every third day for an additional 4 weeks, then discontinue

## 2015-01-09 NOTE — Progress Notes (Signed)
Pre visit review using our clinic review tool, if applicable. No additional management support is needed unless otherwise documented below in the visit note. 

## 2015-01-16 DIAGNOSIS — M75112 Incomplete rotator cuff tear or rupture of left shoulder, not specified as traumatic: Secondary | ICD-10-CM | POA: Diagnosis not present

## 2015-01-16 DIAGNOSIS — M19021 Primary osteoarthritis, right elbow: Secondary | ICD-10-CM | POA: Diagnosis not present

## 2015-01-16 DIAGNOSIS — M19012 Primary osteoarthritis, left shoulder: Secondary | ICD-10-CM | POA: Diagnosis not present

## 2015-02-15 ENCOUNTER — Other Ambulatory Visit: Payer: Self-pay | Admitting: Internal Medicine

## 2015-02-27 DIAGNOSIS — M4806 Spinal stenosis, lumbar region: Secondary | ICD-10-CM | POA: Diagnosis not present

## 2015-03-24 ENCOUNTER — Other Ambulatory Visit: Payer: Self-pay | Admitting: Acute Care

## 2015-03-24 DIAGNOSIS — Z87891 Personal history of nicotine dependence: Secondary | ICD-10-CM

## 2015-04-10 ENCOUNTER — Ambulatory Visit: Payer: 59 | Admitting: Internal Medicine

## 2015-04-15 ENCOUNTER — Encounter: Payer: Self-pay | Admitting: Internal Medicine

## 2015-04-15 ENCOUNTER — Ambulatory Visit (INDEPENDENT_AMBULATORY_CARE_PROVIDER_SITE_OTHER): Payer: Medicare Other | Admitting: Internal Medicine

## 2015-04-15 VITALS — BP 130/70 | HR 87 | Temp 98.5°F | Resp 20 | Ht 72.0 in | Wt 282.0 lb

## 2015-04-15 DIAGNOSIS — M159 Polyosteoarthritis, unspecified: Secondary | ICD-10-CM

## 2015-04-15 DIAGNOSIS — Z794 Long term (current) use of insulin: Secondary | ICD-10-CM

## 2015-04-15 DIAGNOSIS — E785 Hyperlipidemia, unspecified: Secondary | ICD-10-CM

## 2015-04-15 DIAGNOSIS — G8929 Other chronic pain: Secondary | ICD-10-CM

## 2015-04-15 DIAGNOSIS — I10 Essential (primary) hypertension: Secondary | ICD-10-CM

## 2015-04-15 DIAGNOSIS — E119 Type 2 diabetes mellitus without complications: Secondary | ICD-10-CM

## 2015-04-15 DIAGNOSIS — M549 Dorsalgia, unspecified: Secondary | ICD-10-CM

## 2015-04-15 DIAGNOSIS — M150 Primary generalized (osteo)arthritis: Secondary | ICD-10-CM

## 2015-04-15 DIAGNOSIS — M8949 Other hypertrophic osteoarthropathy, multiple sites: Secondary | ICD-10-CM

## 2015-04-15 LAB — HEMOGLOBIN A1C: Hgb A1c MFr Bld: 7.5 % — ABNORMAL HIGH (ref 4.6–6.5)

## 2015-04-15 MED ORDER — INSULIN DETEMIR 100 UNIT/ML FLEXPEN: 64.0000 [IU] | PEN_INJECTOR | Freq: Every day | SUBCUTANEOUS | Status: DC

## 2015-04-15 NOTE — Patient Instructions (Signed)
Increase Levemir to 64 units daily  Limit your sodium (Salt) intake    It is important that you exercise regularly, at least 20 minutes 3 to 4 times per week.  If you develop chest pain or shortness of breath seek  medical attention.  You need to lose weight.  Consider a lower calorie diet and regular exercise.

## 2015-04-15 NOTE — Progress Notes (Signed)
Subjective:    Patient ID: [name], male    DOB: 10/12/1955, 60 y.o.   MRN: [id number]  HPI  Lab Results  Component Value Date   HGBA1C 7.5* 01/09/2015    Wt Readings from Last 3 Encounters:  04/15/15 282 lb (127.914 kg)  01/09/15 285 lb (129.275 kg)  09/29/14 285 lb (129.275 kg)   60 y/o f/u DM2.  FBS 150-200.  Postprandial BS variable depending on diet.  No hypoglycemia. Tapering methadone and now QOD.  Modest weight loss.  Arthritis stable. HTN controlled  Past Medical History  Diagnosis Date  . History of hip replacement, total   . Diabetes mellitus   . Hypertension   . Hyperlipidemia   . GERD (gastroesophageal reflux disease)   . Obesity   . Chronic pain   . History of colonic polyps   . RBBB (right bundle branch block with left anterior fascicular block)   . 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 Left anterior hemiblock   . Adenomatous polyp   . Hx of anaphylactic shock   . Cataract     left eye for sure     Social History   Social History  . Marital Status: Married    Spouse Name: N/A  . Number of Children: N/A  . Years of Education: N/A   Occupational History  . Not on file.   Social History Main Topics  . Smoking status: Former Smoker -- 1.50 packs/day for 42 years    Types: Cigarettes    Quit date: 09/03/2013  . Smokeless tobacco: Former Systems developer    Types: Chew     Comment: We discussed the need to remain smoke free and to call me for help if he has the desire to smoke again.  . Alcohol Use: No     Comment: recovering alcoholic   . Drug Use: No  . Sexual Activity: Not on file   Other Topics Concern  . Not on file   Social History Narrative   Recovering alcoholic      Minister     Past Surgical History  Procedure Laterality Date  . Hip surgery  12/29/09    R THR  (Nitka)  . Appendectomy    . Hand surgery    . Cervical laminectomy    . Cervical fusion    . Hip surgery      bilateral  . Total knee  arthroplasty  01/2008    left knee  . Total hip arthroplasty Left 09/10/2012    Procedure: REMOVAL OF HARDWARE LEFT PROXIMAL THIGH/FEMUR AND LEFT TOTAL HIP ARTHROPLASTY;  Surgeon: Jessy Oto, MD;  Location: [facility];  Service: Orthopedics;  Laterality: Left;  . Hardware removal Left 09/10/2012    Procedure: HARDWARE REMOVAL;  Surgeon: Jessy Oto, MD;  Location: [facility];  Service: Orthopedics;  Laterality: Left;  . Colonoscopy      Family History  Problem Relation Age of Onset  . Cancer Mother     lung  . Crohn's disease Father   . Alcohol abuse Brother   . Colon polyps Brother   . Colon cancer Neg Hx     Allergies  Allergen Reactions  . Codeine     REACTION: itch  . Morphine And Related Nausea And Vomiting  . Naproxen     REACTION: anaphylaxis    Current Outpatient Prescriptions on File Prior to Visit  Medication Sig Dispense Refill  . atorvastatin (LIPITOR)  40 MG tablet Take 1 tablet by mouth  daily 90 tablet 2  . Blood Glucose Monitoring Suppl (ONE TOUCH ULTRA SYSTEM KIT) W/DEVICE KIT 1 kit by Does not apply route once. 1 each 0  . diclofenac sodium (VOLTAREN) 1 % GEL Apply topically.      Marland Kitchen HYDROcodone-acetaminophen (NORCO) 7.5-325 MG per tablet Take 1-2 tablets by mouth every 4 (four) hours as needed for pain (maximum of 8 total pain medication and tylenol tablets per day.). 60 tablet 1  . insulin lispro (HUMALOG KWIKPEN) 100 UNIT/ML KiwkPen Inject 20 units prior to  breakfast and lunch and 28  units prior to PM meal.  If BS> then 180, add an additional 4 units. 30 pen 3  . Insulin Pen Needle (NOVOTWIST) 32G X 5 MM MISC USE TO INJECT INSULIN THREE TIMES A DAY 100 each 4  . lidocaine (LIDODERM) 5 % Place 1 patch onto the skin as needed.     Marland Kitchen lisinopril-hydrochlorothiazide (PRINZIDE,ZESTORETIC) 20-25 MG tablet Take 1 tablet by mouth  daily 90 tablet 2  . metFORMIN (GLUCOPHAGE) 1000 MG tablet Take 1 tablet by mouth  twice a day with meals 180 tablet 1  . methadone (DOLOPHINE) 10  MG tablet Take one to four tablets daily as needed 90 tablet 0  . methocarbamol (ROBAXIN) 750 MG tablet Take 1 tablet by mouth  every 6 hours as needed 90 tablet 1  . Multiple Vitamins-Minerals (MULTIVITAMIN WITH MINERALS) tablet Take 1 tablet by mouth daily.    Marland Kitchen Nystatin (NYAMYC) 100000 UNIT/GM POWD Apply 1 Bottle topically as needed.     Marland Kitchen omeprazole (PRILOSEC) 20 MG capsule Take 1 capsule by mouth  daily 90 capsule 3  . ONE TOUCH ULTRA TEST test strip Check blood sugar daily and as needed 200 each 3  . promethazine (PHENERGAN) 25 MG tablet Take 1 tablet by mouth  every 6 hours as needed for nausea or vomiting 180 tablet 2  . Sildenafil Citrate (VIAGRA PO) Take 1 tablet by mouth as needed.     No current facility-administered medications on file prior to visit.    BP 130/70 mmHg  Pulse 87  Temp(Src) 98.5 F (36.9 C) (Oral)  Resp 20  Ht 6' (1.829 m)  Wt 282 lb (127.914 kg)  BMI 38.24 kg/m2  SpO2 98%    Review of Systems  Constitutional: Negative for fever, chills, appetite change and fatigue.  HENT: Negative for congestion, dental problem, ear pain, hearing loss, sore throat, tinnitus, trouble swallowing and voice change.   Eyes: Negative for pain, discharge and visual disturbance.  Respiratory: Negative for cough, chest tightness, wheezing and stridor.   Cardiovascular: Negative for chest pain, palpitations and leg swelling.  Gastrointestinal: Negative for nausea, vomiting, abdominal pain, diarrhea, constipation, blood in stool and abdominal distention.  Genitourinary: Negative for urgency, hematuria, flank pain, discharge, difficulty urinating and genital sores.  Musculoskeletal: Positive for back pain and arthralgias. Negative for myalgias, joint swelling, gait problem and neck stiffness.  Skin: Negative for rash.  Neurological: Negative for dizziness, syncope, speech difficulty, weakness, numbness and headaches.  Hematological: Negative for adenopathy. Does not bruise/bleed  easily.  Psychiatric/Behavioral: Negative for behavioral problems and dysphoric mood. The patient is not nervous/anxious.        Objective:   Physical Exam  Constitutional: He is oriented to person, place, and time. He appears well-developed.  BP 130/70 Wt 282  HENT:  Head: Normocephalic.  Right Ear: External ear normal.  Left Ear: External ear normal.  Eyes: Conjunctivae and EOM are normal.  Neck: Normal range of motion.  Cardiovascular: Normal rate and normal heart sounds.   Pulmonary/Chest: Breath sounds normal.  Abdominal: Bowel sounds are normal.  Musculoskeletal: Normal range of motion. He exhibits no edema or tenderness.  Neurological: He is alert and oriented to person, place, and time.  Psychiatric: He has a normal mood and affect. His behavior is normal.          Assessment & Plan:   DM2- check HghA1C HTN-stable Obesity Chronic LBP  ROV 3 months

## 2015-04-15 NOTE — Progress Notes (Signed)
Pre visit review using our clinic review tool, if applicable. No additional management support is needed unless otherwise documented below in the visit note. 

## 2015-04-29 DIAGNOSIS — M19012 Primary osteoarthritis, left shoulder: Secondary | ICD-10-CM | POA: Diagnosis not present

## 2015-04-29 DIAGNOSIS — M4806 Spinal stenosis, lumbar region: Secondary | ICD-10-CM | POA: Diagnosis not present

## 2015-06-25 DIAGNOSIS — M25551 Pain in right hip: Secondary | ICD-10-CM | POA: Diagnosis not present

## 2015-06-25 DIAGNOSIS — M4806 Spinal stenosis, lumbar region: Secondary | ICD-10-CM | POA: Diagnosis not present

## 2015-06-25 DIAGNOSIS — M75112 Incomplete rotator cuff tear or rupture of left shoulder, not specified as traumatic: Secondary | ICD-10-CM | POA: Diagnosis not present

## 2015-06-29 ENCOUNTER — Other Ambulatory Visit: Payer: Self-pay | Admitting: Internal Medicine

## 2015-07-08 ENCOUNTER — Other Ambulatory Visit: Payer: Self-pay | Admitting: Internal Medicine

## 2015-07-09 ENCOUNTER — Telehealth: Payer: Self-pay | Admitting: Internal Medicine

## 2015-07-09 NOTE — Telephone Encounter (Signed)
Pt is waiting for mailorder and needs samples of humalog kwik pens

## 2015-07-09 NOTE — Telephone Encounter (Signed)
Pt is aware samples ready

## 2015-07-15 ENCOUNTER — Encounter: Payer: Self-pay | Admitting: Internal Medicine

## 2015-07-15 ENCOUNTER — Ambulatory Visit (INDEPENDENT_AMBULATORY_CARE_PROVIDER_SITE_OTHER): Payer: 59 | Admitting: Internal Medicine

## 2015-07-15 VITALS — BP 124/80 | HR 88 | Temp 98.2°F | Resp 20 | Ht 72.0 in | Wt 279.0 lb

## 2015-07-15 DIAGNOSIS — I10 Essential (primary) hypertension: Secondary | ICD-10-CM | POA: Diagnosis not present

## 2015-07-15 DIAGNOSIS — M549 Dorsalgia, unspecified: Secondary | ICD-10-CM

## 2015-07-15 DIAGNOSIS — Z794 Long term (current) use of insulin: Secondary | ICD-10-CM | POA: Diagnosis not present

## 2015-07-15 DIAGNOSIS — E669 Obesity, unspecified: Secondary | ICD-10-CM

## 2015-07-15 DIAGNOSIS — E119 Type 2 diabetes mellitus without complications: Secondary | ICD-10-CM | POA: Diagnosis not present

## 2015-07-15 DIAGNOSIS — G8929 Other chronic pain: Secondary | ICD-10-CM

## 2015-07-15 LAB — HEMOGLOBIN A1C: Hgb A1c MFr Bld: 7.5 % — ABNORMAL HIGH (ref 4.6–6.5)

## 2015-07-15 MED ORDER — FEXOFENADINE HCL 180 MG PO TABS: 180.0000 mg | ORAL_TABLET | Freq: Every day | ORAL | Status: DC

## 2015-07-15 MED ORDER — INSULIN DETEMIR 100 UNIT/ML FLEXPEN: 70.0000 [IU] | PEN_INJECTOR | Freq: Every day | SUBCUTANEOUS | Status: DC

## 2015-07-15 NOTE — Patient Instructions (Signed)
Limit your sodium (Salt) intake    It is important that you exercise regularly, at least 20 minutes 3 to 4 times per week.  If you develop chest pain or shortness of breath seek  medical attention.  You need to lose weight.  Consider a lower calorie diet and regular exercise.   Please check your hemoglobin A1c every 3 months   

## 2015-07-15 NOTE — Progress Notes (Signed)
Subjective:    Patient ID: [name], male    DOB: 06/22/1955, 60 y.o.   MRN: [id number]  HPI  Wt Readings from Last 3 Encounters:  07/15/15 279 lb (126.554 kg)  04/15/15 282 lb (127.914 kg)  01/09/15 285 lb (129.275 kg)   Lab Results  Component Value Date   HGBA1C 7.5* 04/15/2015   60 year old patient who is seen today for follow-up of diabetes.  Remains on basal bolus insulin.  He is on Levemir 64 units at bedtime.  He states fasting blood sugars generally are between 1:30 and 170.  He occasionally checks blood sugars before his evening meal and generally are in the 170-190 range  There is been some modest weight loss.  He tapered and discontinued methadone and has been off for several weeks.  He continues to use when necessary hydrocodone for back pain.  Past Medical History  Diagnosis Date  . History of hip replacement, total   . Diabetes mellitus   . Hypertension   . Hyperlipidemia   . GERD (gastroesophageal reflux disease)   . Obesity   . Chronic pain   . History of colonic polyps   . RBBB (right bundle branch block with left anterior fascicular block)   . 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 Left anterior hemiblock   . Adenomatous polyp   . Hx of anaphylactic shock   . Cataract     left eye for sure      Social History   Social History  . Marital Status: Married    Spouse Name: N/A  . Number of Children: N/A  . Years of Education: N/A   Occupational History  . Not on file.   Social History Main Topics  . Smoking status: Former Smoker -- 1.50 packs/day for 42 years    Types: Cigarettes    Quit date: 09/03/2013  . Smokeless tobacco: Former Systems developer    Types: Chew     Comment: We discussed the need to remain smoke free and to call me for help if he has the desire to smoke again.  . Alcohol Use: No     Comment: recovering alcoholic   . Drug Use: No  . Sexual Activity: Not on file   Other Topics Concern  . Not on file   Social  History Narrative   Recovering alcoholic      Minister     Past Surgical History  Procedure Laterality Date  . Hip surgery  12/29/09    R THR  (Nitka)  . Appendectomy    . Hand surgery    . Cervical laminectomy    . Cervical fusion    . Hip surgery      bilateral  . Total knee arthroplasty  01/2008    left knee  . Total hip arthroplasty Left 09/10/2012    Procedure: REMOVAL OF HARDWARE LEFT PROXIMAL THIGH/FEMUR AND LEFT TOTAL HIP ARTHROPLASTY;  Surgeon: Jessy Oto, MD;  Location: [place];  Service: Orthopedics;  Laterality: Left;  . Hardware removal Left 09/10/2012    Procedure: HARDWARE REMOVAL;  Surgeon: Jessy Oto, MD;  Location: [place];  Service: Orthopedics;  Laterality: Left;  . Colonoscopy      Family History  Problem Relation Age of Onset  . Cancer Mother     lung  . Crohn's disease Father   . Alcohol abuse Brother   . Colon polyps Brother   . Colon cancer Neg  Hx     Allergies  Allergen Reactions  . Codeine     REACTION: itch  . Morphine And Related Nausea And Vomiting  . Naproxen     REACTION: anaphylaxis    Current Outpatient Prescriptions on File Prior to Visit  Medication Sig Dispense Refill  . atorvastatin (LIPITOR) 40 MG tablet Take 1 tablet by mouth  daily 90 tablet 2  . Blood Glucose Monitoring Suppl (ONE TOUCH ULTRA SYSTEM KIT) W/DEVICE KIT 1 kit by Does not apply route once. 1 each 0  . diclofenac sodium (VOLTAREN) 1 % GEL Apply topically.      Marland Kitchen HUMALOG KWIKPEN 100 UNIT/ML KiwkPen 6 UNITS PRIOR TO BREAKFAST AND LUNCH AND 12 UNITS PRIOR TO YOUR EVENING MEAL IF BLOOD SUGAR IS GREATER THAN 180, ADD AN ADDITIONAL 4 UNITS (Patient taking differently: 20 UNITS PRIOR TO BREAKFAST AND LUNCH AND 32 UNITS PRIOR TO YOUR EVENING MEAL IF BLOOD SUGAR IS GREATER THAN 180, ADD AN ADDITIONAL 4 UNITS) 15 pen 3  . HYDROcodone-acetaminophen (NORCO) 7.5-325 MG per tablet Take 1-2 tablets by mouth every 4 (four) hours as needed for pain (maximum of 8 total pain  medication and tylenol tablets per day.). 60 tablet 1  . Insulin Pen Needle (NOVOTWIST) 32G X 5 MM MISC USE TO INJECT INSULIN THREE TIMES A DAY 100 each 4  . lidocaine (LIDODERM) 5 % Place 1 patch onto the skin as needed.     Marland Kitchen lisinopril-hydrochlorothiazide (PRINZIDE,ZESTORETIC) 20-25 MG tablet Take 1 tablet by mouth  daily 90 tablet 2  . metFORMIN (GLUCOPHAGE) 1000 MG tablet Take 1 tablet by mouth  twice a day with meals 180 tablet 1  . methocarbamol (ROBAXIN) 750 MG tablet Take 1 tablet by mouth  every 6 hours as needed 90 tablet 1  . Multiple Vitamins-Minerals (MULTIVITAMIN WITH MINERALS) tablet Take 1 tablet by mouth daily.    Marland Kitchen Nystatin (NYAMYC) 100000 UNIT/GM POWD Apply 1 Bottle topically as needed.     Marland Kitchen omeprazole (PRILOSEC) 20 MG capsule Take 1 capsule by mouth  daily 90 capsule 3  . ONE TOUCH ULTRA TEST test strip Check blood sugar daily and as needed 200 each 4  . promethazine (PHENERGAN) 25 MG tablet Take 1 tablet by mouth  every 6 hours as needed for nausea or vomiting 180 tablet 2  . Sildenafil Citrate (VIAGRA PO) Take 1 tablet by mouth as needed.     No current facility-administered medications on file prior to visit.    BP 124/80 mmHg  Pulse 88  Temp(Src) 98.2 F (36.8 C) (Oral)  Resp 20  Ht 6' (1.829 m)  Wt 279 lb (126.554 kg)  BMI 37.83 kg/m2  SpO2 98%    Review of Systems  Constitutional: Positive for fatigue. Negative for fever, chills and appetite change.  HENT: Positive for congestion, postnasal drip and rhinorrhea. Negative for dental problem, ear pain, hearing loss, sore throat, tinnitus, trouble swallowing and voice change.   Eyes: Negative for pain, discharge and visual disturbance.  Respiratory: Negative for cough, chest tightness, wheezing and stridor.   Cardiovascular: Negative for chest pain, palpitations and leg swelling.  Gastrointestinal: Negative for nausea, vomiting, abdominal pain, diarrhea, constipation, blood in stool and abdominal distention.    Genitourinary: Negative for urgency, hematuria, flank pain, discharge, difficulty urinating and genital sores.  Musculoskeletal: Positive for back pain and arthralgias. Negative for myalgias, joint swelling, gait problem and neck stiffness.  Skin: Negative for rash.  Neurological: Negative for dizziness, syncope, speech difficulty, weakness,  numbness and headaches.  Hematological: Negative for adenopathy. Does not bruise/bleed easily.  Psychiatric/Behavioral: Negative for behavioral problems and dysphoric mood. The patient is not nervous/anxious.        Objective:   Physical Exam  Constitutional: He is oriented to person, place, and time. He appears well-developed.  Obese.  Weight 279 Blood pressure well controlled  HENT:  Head: Normocephalic.  Right Ear: External ear normal.  Left Ear: External ear normal.  Eyes: Conjunctivae and EOM are normal.  Neck: Normal range of motion.  Cardiovascular: Normal rate and normal heart sounds.   Pulmonary/Chest: Breath sounds normal.  Abdominal: Bowel sounds are normal.  Musculoskeletal: Normal range of motion. He exhibits no edema or tenderness.  Neurological: He is alert and oriented to person, place, and time.  Psychiatric: He has a normal mood and affect. His behavior is normal.          Assessment & Plan:   Diabetes mellitus.  Will increase Levemir to 70 units at bedtime.  Review hemoglobin A1c probably will need to intensify mealtime insulin Essential hypertension, stable Chronic low back pain.  Patient has been off methadone for several weeks.  Medicine removed from his medicine last Obesity.  Efforts of weight loss encouraged Allergic rhinitis.  Will give a trial of Allegra  Recheck 3 months  Nyoka Cowden, MD

## 2015-07-15 NOTE — Progress Notes (Signed)
Pre visit review using our clinic review tool, if applicable. No additional management support is needed unless otherwise documented below in the visit note. 

## 2015-08-26 DIAGNOSIS — L821 Other seborrheic keratosis: Secondary | ICD-10-CM | POA: Diagnosis not present

## 2015-08-26 DIAGNOSIS — D1801 Hemangioma of skin and subcutaneous tissue: Secondary | ICD-10-CM | POA: Diagnosis not present

## 2015-08-26 DIAGNOSIS — L570 Actinic keratosis: Secondary | ICD-10-CM | POA: Diagnosis not present

## 2015-08-26 DIAGNOSIS — L814 Other melanin hyperpigmentation: Secondary | ICD-10-CM | POA: Diagnosis not present

## 2015-08-26 DIAGNOSIS — Z85828 Personal history of other malignant neoplasm of skin: Secondary | ICD-10-CM | POA: Diagnosis not present

## 2015-09-21 ENCOUNTER — Other Ambulatory Visit: Payer: Self-pay | Admitting: Internal Medicine

## 2015-09-24 DIAGNOSIS — M4806 Spinal stenosis, lumbar region: Secondary | ICD-10-CM | POA: Diagnosis not present

## 2015-10-15 ENCOUNTER — Ambulatory Visit (INDEPENDENT_AMBULATORY_CARE_PROVIDER_SITE_OTHER)
Admission: RE | Admit: 2015-10-15 | Discharge: 2015-10-15 | Disposition: A | Discharge status: Home / Self Care | Payer: 59 | Source: Ambulatory Visit | Attending: Acute Care | Admitting: Acute Care

## 2015-10-15 DIAGNOSIS — Z87891 Personal history of nicotine dependence: Secondary | ICD-10-CM

## 2015-10-16 ENCOUNTER — Telehealth: Payer: Self-pay | Admitting: Acute Care

## 2015-10-16 DIAGNOSIS — Z87891 Personal history of nicotine dependence: Secondary | ICD-10-CM

## 2015-10-16 NOTE — Telephone Encounter (Signed)
I have called Mr. [name] with the results of his low-dose CT screening scan. I explained to him that his scan was read as a lung RADS 2, nodules that are benign in appearance or behavior. Recommendation is for her annual screening with low-dose CT without contrast in 12 months. I told him we would order and schedule the exam for October 2018. I also discussed with him the incidental finding of aortic atherosclerosis in addition to left main and three-vessel coronary artery disease. I explained that this is a non-gated exam and therefore cannot give degree or severity. These results are similar to what were noted last year on the patient's scan. [name] told me that he has been taking Lipitor for over 20 years for his elevated cholesterol. We also discussed emphysema and imaging findings that are supportive of underlying COPD. We discussed talking to his primary care physician about a pulmonary consult. [name] verbalized understanding of the above and had no further questions at completion of the call. He has by contact information in the event he has any questions. I did explain to him that I would send a copy of this report to his primary care doctor.

## 2015-10-20 ENCOUNTER — Ambulatory Visit: Payer: 59 | Admitting: Internal Medicine

## 2015-10-27 ENCOUNTER — Ambulatory Visit (INDEPENDENT_AMBULATORY_CARE_PROVIDER_SITE_OTHER): Payer: 59 | Admitting: Internal Medicine

## 2015-10-27 ENCOUNTER — Encounter: Payer: Self-pay | Admitting: Internal Medicine

## 2015-10-27 VITALS — BP 130/74 | HR 80 | Temp 97.7°F | Resp 20 | Ht 72.0 in | Wt 285.5 lb

## 2015-10-27 DIAGNOSIS — Z794 Long term (current) use of insulin: Secondary | ICD-10-CM | POA: Diagnosis not present

## 2015-10-27 DIAGNOSIS — M8949 Other hypertrophic osteoarthropathy, multiple sites: Secondary | ICD-10-CM

## 2015-10-27 DIAGNOSIS — M150 Primary generalized (osteo)arthritis: Secondary | ICD-10-CM | POA: Diagnosis not present

## 2015-10-27 DIAGNOSIS — E119 Type 2 diabetes mellitus without complications: Secondary | ICD-10-CM

## 2015-10-27 DIAGNOSIS — Z23 Encounter for immunization: Secondary | ICD-10-CM

## 2015-10-27 DIAGNOSIS — M159 Polyosteoarthritis, unspecified: Secondary | ICD-10-CM

## 2015-10-27 DIAGNOSIS — E785 Hyperlipidemia, unspecified: Secondary | ICD-10-CM

## 2015-10-27 DIAGNOSIS — I10 Essential (primary) hypertension: Secondary | ICD-10-CM

## 2015-10-27 LAB — CBC WITH DIFFERENTIAL/PLATELET
Basophils Absolute: 0.1 10*3/uL (ref 0.0–0.1)
Basophils Relative: 0.6 % (ref 0.0–3.0)
Eosinophils Absolute: 0.2 10*3/uL (ref 0.0–0.7)
Eosinophils Relative: 1.8 % (ref 0.0–5.0)
HCT: 50.1 % (ref 39.0–52.0)
Hemoglobin: 16.9 g/dL (ref 13.0–17.0)
Lymphocytes Relative: 31.1 % (ref 12.0–46.0)
Lymphs Abs: 3.1 10*3/uL (ref 0.7–4.0)
MCHC: 33.7 g/dL (ref 30.0–36.0)
MCV: 86 fl (ref 78.0–100.0)
Monocytes Absolute: 0.8 10*3/uL (ref 0.1–1.0)
Monocytes Relative: 8.1 % (ref 3.0–12.0)
Neutro Abs: 5.8 10*3/uL (ref 1.4–7.7)
Neutrophils Relative %: 58.4 % (ref 43.0–77.0)
Platelets: 200 10*3/uL (ref 150.0–400.0)
RBC: 5.83 Mil/uL — ABNORMAL HIGH (ref 4.22–5.81)
RDW: 13.6 % (ref 11.5–15.5)
WBC: 9.9 10*3/uL (ref 4.0–10.5)

## 2015-10-27 LAB — LIPID PANEL
Cholesterol: 173 mg/dL (ref 0–200)
HDL: 31.6 mg/dL — ABNORMAL LOW (ref >39.00)
Total CHOL/HDL Ratio: 5
Triglycerides: 449 mg/dL — ABNORMAL HIGH (ref 0.0–149.0)

## 2015-10-27 LAB — COMPREHENSIVE METABOLIC PANEL
ALT: 38 U/L (ref 0–53)
AST: 31 U/L (ref 0–37)
Albumin: 4.6 g/dL (ref 3.5–5.2)
Alkaline Phosphatase: 44 U/L (ref 39–117)
BUN: 16 mg/dL (ref 6–23)
CO2: 28 mEq/L (ref 19–32)
Calcium: 10 mg/dL (ref 8.4–10.5)
Chloride: 97 mEq/L (ref 96–112)
Creatinine, Ser: 0.73 mg/dL (ref 0.40–1.50)
GFR: 115.65 mL/min (ref >60.00)
Glucose, Bld: 132 mg/dL — ABNORMAL HIGH (ref 70–99)
Potassium: 4.4 mEq/L (ref 3.5–5.1)
Sodium: 138 mEq/L (ref 135–145)
Total Bilirubin: 1.2 mg/dL (ref 0.2–1.2)
Total Protein: 7.5 g/dL (ref 6.0–8.3)

## 2015-10-27 LAB — MICROALBUMIN / CREATININE URINE RATIO
Creatinine,U: 71.6 mg/dL
Microalb Creat Ratio: 2.2 mg/g (ref 0.0–30.0)
Microalb, Ur: 1.6 mg/dL (ref 0.0–1.9)

## 2015-10-27 LAB — TSH: TSH: 1.97 u[IU]/mL (ref 0.35–4.50)

## 2015-10-27 LAB — LDL CHOLESTEROL, DIRECT: Direct LDL: 100 mg/dL

## 2015-10-27 LAB — HEMOGLOBIN A1C: Hgb A1c MFr Bld: 7.8 % — ABNORMAL HIGH (ref 4.6–6.5)

## 2015-10-27 MED ORDER — SILDENAFIL CITRATE 50 MG PO TABS: 50.0000 mg | ORAL_TABLET | ORAL | 1 refills | Status: DC | PRN

## 2015-10-27 MED ORDER — INSULIN LISPRO 100 UNIT/ML (KWIKPEN): PEN_INJECTOR | SUBCUTANEOUS | 3 refills | Status: DC

## 2015-10-27 MED ORDER — FEXOFENADINE HCL 180 MG PO TABS: 180.0000 mg | ORAL_TABLET | Freq: Every day | ORAL | 3 refills | Status: DC

## 2015-10-27 MED ORDER — INSULIN DETEMIR 100 UNIT/ML FLEXPEN: 80.0000 [IU] | PEN_INJECTOR | Freq: Every day | SUBCUTANEOUS | 1 refills | Status: DC

## 2015-10-27 NOTE — Progress Notes (Signed)
Subjective:    Patient ID: [name], male    DOB: 02/22/1955, 60 y.o.   MRN: [id number]  HPI  Lab Results  Component Value Date   HGBA1C 7.5 (H) 07/15/2015    Wt Readings from Last 3 Encounters:  10/27/15 285 lb 8 oz (129.5 kg)  07/15/15 279 lb (126.6 kg)  04/15/15 282 lb (10.35 kg)   60 year old patient who is seen today for follow-up of his diabetes.  Last hemoglobin A1c 7.5 He states that he has had some situational depression and blood sugars were elevated with some associated weight gain.  For the past 3 weeks he has lost 4 pounds in weight with improved glycemic control.  Fasting blood sugars are still in the 150-180 range.  However  Since his last visit here, he has had a low-dose chest CT for lung cancer screening.  This revealed extensive coronary artery calcification and also some early emphysematous changes.  He was made aware of these results and he states this has been a source of some of his depression.  The studies discussed at length.  Patient did have a nuclear stress test performed about 3 years ago.  He is also been depressed about his general status with deconditioning.  He states that he anticipates in the near future.  5.  Additional surgeries including both shoulders, bilateral hernia repair as well as consideration of surgery for severe spinal stenosis.  He states that he has had 12 major surgeries previously  Past Medical History:  Diagnosis Date  . Adenomatous polyp   . Cataract    left eye for sure   . Chronic pain   . Diabetes mellitus   . GERD (gastroesophageal reflux disease)   . History of colonic polyps   . History of hip replacement, total   . Hx of anaphylactic shock   . Hyperlipidemia   . Hypertension   . Left anterior hemiblock   . Obesity   . RBBB (right bundle branch block with left anterior fascicular block)   . 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 Marital status: Married      Spouse name: N/A  . Number of children: N/A  . Years of education: N/A   Occupational History  . Not on file.   Social History Main Topics  . Smoking status: Former Smoker    Packs/day: 1.50    Years: 42.00    Types: Cigarettes    Quit date: 09/03/2013  . Smokeless tobacco: Former Systems developer    Types: Chew     Comment: We discussed the need to remain smoke free and to call me for help if he has the desire to smoke again.  . Alcohol use No     Comment: recovering alcoholic   . Drug use: No  . Sexual activity: Not on file   Other Topics Concern  . Not on file   Social History Narrative   Recovering alcoholic      Minister     Past Surgical History:  Procedure Laterality Date  . APPENDECTOMY    . CERVICAL FUSION    . CERVICAL LAMINECTOMY    . COLONOSCOPY    . HAND SURGERY    . HARDWARE REMOVAL Left 09/10/2012   Procedure: HARDWARE REMOVAL;  Surgeon: Jessy Oto, MD;  Location: [place];  Service: Orthopedics;  Laterality: Left;  . HIP SURGERY  12/29/09  R THR  (Nitka)  . HIP SURGERY     bilateral  . TOTAL HIP ARTHROPLASTY Left 09/10/2012   Procedure: REMOVAL OF HARDWARE LEFT PROXIMAL THIGH/FEMUR AND LEFT TOTAL HIP ARTHROPLASTY;  Surgeon: Jessy Oto, MD;  Location: [place];  Service: Orthopedics;  Laterality: Left;  . TOTAL KNEE ARTHROPLASTY  01/2008   left knee    Family History  Problem Relation Age of Onset  . Cancer Mother     lung  . Crohn's disease Father   . Alcohol abuse Brother   . Colon polyps Brother   . Colon cancer Neg Hx     Allergies  Allergen Reactions  . Codeine     REACTION: itch  . Morphine And Related Nausea And Vomiting  . Naproxen     REACTION: anaphylaxis    Current Outpatient Prescriptions on File Prior to Visit  Medication Sig Dispense Refill  . atorvastatin (LIPITOR) 40 MG tablet Take 1 tablet by mouth  daily 90 tablet 1  . Blood Glucose Monitoring Suppl (ONE TOUCH ULTRA SYSTEM KIT) W/DEVICE KIT 1 kit by Does not apply route once.  1 each 0  . diclofenac sodium (VOLTAREN) 1 % GEL Apply topically.      Marland Kitchen HYDROcodone-acetaminophen (NORCO) 7.5-325 MG per tablet Take 1-2 tablets by mouth every 4 (four) hours as needed for pain (maximum of 8 total pain medication and tylenol tablets per day.). 60 tablet 1  . Insulin Pen Needle (NOVOTWIST) 32G X 5 MM MISC USE TO INJECT INSULIN THREE TIMES A DAY 100 each 4  . lidocaine (LIDODERM) 5 % Place 1 patch onto the skin as needed.     Marland Kitchen lisinopril-hydrochlorothiazide (PRINZIDE,ZESTORETIC) 20-25 MG tablet Take 1 tablet by mouth  daily 90 tablet 1  . metFORMIN (GLUCOPHAGE) 1000 MG tablet Take 1 tablet by mouth  twice a day with meals 180 tablet 1  . methocarbamol (ROBAXIN) 750 MG tablet Take 1 tablet by mouth  every 6 hours as needed 90 tablet 1  . Multiple Vitamins-Minerals (MULTIVITAMIN WITH MINERALS) tablet Take 1 tablet by mouth daily.    Marland Kitchen Nystatin (NYAMYC) 100000 UNIT/GM POWD Apply 1 Bottle topically as needed.     Marland Kitchen omeprazole (PRILOSEC) 20 MG capsule Take 1 capsule by mouth  daily 90 capsule 3  . ONE TOUCH ULTRA TEST test strip Check blood sugar daily and as needed 200 each 4  . promethazine (PHENERGAN) 25 MG tablet Take 1 tablet by mouth  every 6 hours as needed for nausea or vomiting 180 tablet 2   No current facility-administered medications on file prior to visit.     BP 130/74 (BP Location: Left Arm, Patient Position: Sitting, Cuff Size: Normal)   Pulse 80   Temp 97.7 F (36.5 C) (Oral)   Resp 20   Ht 6' (1.829 m)   Wt 285 lb 8 oz (129.5 kg)   SpO2 98%   BMI 38.72 kg/m      Review of Systems  Constitutional: Positive for fatigue. Negative for appetite change, chills and fever.  HENT: Negative for congestion, dental problem, ear pain, hearing loss, sore throat, tinnitus, trouble swallowing and voice change.   Eyes: Negative for pain, discharge and visual disturbance.  Respiratory: Negative for cough, chest tightness, wheezing and stridor.   Cardiovascular:  Negative for chest pain, palpitations and leg swelling.  Gastrointestinal: Negative for abdominal distention, abdominal pain, blood in stool, constipation, diarrhea, nausea and vomiting.  Genitourinary: Negative for difficulty urinating, discharge, flank pain, genital  sores, hematuria and urgency.  Musculoskeletal: Positive for arthralgias, back pain and gait problem. Negative for joint swelling, myalgias and neck stiffness.  Skin: Negative for rash.  Neurological: Negative for dizziness, syncope, speech difficulty, weakness, numbness and headaches.  Hematological: Negative for adenopathy. Does not bruise/bleed easily.  Psychiatric/Behavioral: Positive for dysphoric mood. Negative for behavioral problems. The patient is not nervous/anxious.        Objective:   Physical Exam  Constitutional: He is oriented to person, place, and time. He appears well-developed.  Obese.  Blood pressure 130/70   HENT:  Head: Normocephalic.  Right Ear: External ear normal.  Left Ear: External ear normal.  Eyes: Conjunctivae and EOM are normal.  Neck: Normal range of motion.  Cardiovascular: Normal rate and normal heart sounds.   Pulmonary/Chest: Effort normal and breath sounds normal. No respiratory distress. He has no wheezes. He has no rales.  Abdominal: Bowel sounds are normal.  Musculoskeletal: Normal range of motion. He exhibits no edema or tenderness.  Neurological: He is alert and oriented to person, place, and time.  Psychiatric: He has a normal mood and affect. His behavior is normal.          Assessment & Plan:   Diabetes mellitus.  Will review a more updated hemoglobin A1c.  Increase Levemir to 80 units at bedtime.  Weight loss better diet, exercise program.  All discussed and encouraged.  He states that he has recently obtained a exercise bike and has been successful at weight loss over the past 3 weeks Hypertension, stable Spinal stenosis dyslipidemia.  Continue statin therapy Coronary  artery calcification.  Patient had a negative nuclear stress test about 3 years ago.  Will repeat if the patient develops any symptoms or prior to any major surgery.  Continue aggressive risk factor modification Dyslipidemia.  Continue statin therapy  Nyoka Cowden

## 2015-10-27 NOTE — Patient Instructions (Signed)
Increase Levemir to 80 units at bedtime  Limit your sodium (Salt) intake    It is important that you exercise regularly, at least 20 minutes 3 to 4 times per week.  If you develop chest pain or shortness of breath seek  medical attention.  You need to lose weight.  Consider a lower calorie diet and regular exercise.

## 2015-11-05 ENCOUNTER — Ambulatory Visit (INDEPENDENT_AMBULATORY_CARE_PROVIDER_SITE_OTHER): Payer: 59 | Admitting: Specialist

## 2015-11-05 ENCOUNTER — Encounter (INDEPENDENT_AMBULATORY_CARE_PROVIDER_SITE_OTHER): Payer: Self-pay | Admitting: Specialist

## 2015-11-05 VITALS — BP 134/86 | HR 89 | Ht 71.0 in | Wt 281.0 lb

## 2015-11-05 DIAGNOSIS — M19012 Primary osteoarthritis, left shoulder: Secondary | ICD-10-CM

## 2015-11-05 DIAGNOSIS — M19011 Primary osteoarthritis, right shoulder: Secondary | ICD-10-CM

## 2015-11-05 DIAGNOSIS — M48062 Spinal stenosis, lumbar region with neurogenic claudication: Secondary | ICD-10-CM | POA: Diagnosis not present

## 2015-11-05 DIAGNOSIS — M47812 Spondylosis without myelopathy or radiculopathy, cervical region: Secondary | ICD-10-CM | POA: Diagnosis not present

## 2015-11-05 DIAGNOSIS — M778 Other enthesopathies, not elsewhere classified: Secondary | ICD-10-CM

## 2015-11-05 DIAGNOSIS — M7581 Other shoulder lesions, right shoulder: Secondary | ICD-10-CM | POA: Diagnosis not present

## 2015-11-05 MED ORDER — HYDROCODONE-ACETAMINOPHEN 7.5-325 MG PO TABS: 1.0000 | ORAL_TABLET | ORAL | 0 refills | Status: DC | PRN

## 2015-11-05 NOTE — Progress Notes (Signed)
Office Visit Note   Patient: [name]           Date of Birth: 1955/11/23           MRN: [id number] Visit Date: 11/05/2015              Requested by: Marletta Lor, MD [place], [place] [id number] PCP: Nyoka Cowden, MD   Assessment & Plan: Visit Diagnoses:  1. Localized osteoarthritis of shoulders, bilateral   2. Shoulder tendonitis, right   3. Spondylosis without myelopathy or radiculopathy, cervical region   4. Spinal stenosis, lumbar region with neurogenic claudication     Plan: Avoid overhead use of arms and overhead lifting. No lifting greater than 10 lbs. Do stretching exercises of the shoulders. Walk as tolerated and use a stationary bike for exercise. Yogurt daily. Flexion exercises for the lumbar spine. Ice or moist heat for shoulders and back Return in one month. Okay to make appt with [facility] in [place] for assistance with health care maintenance.  Follow-Up Instructions: Return in about 4 weeks (around 12/03/2015) for Assess shoulders and lumbar stenosis.   Orders:  No orders of the defined types were placed in this encounter.  Meds ordered this encounter  Medications  . HYDROcodone-acetaminophen (NORCO) 7.5-325 MG tablet    Sig: Take 1-2 tablets by mouth every 4 (four) hours as needed.    Dispense:  60 tablet    Refill:  0      Procedures: No procedures performed   Clinical Data: Findings:  Previous MRI of the right shoulder in 2013 with DJD type 3 acromium process. MRI of the left shoulder showed DJD of the G-H joint with partial thickness tears interstitial , no full thickness tears Has seen Dr. Marlou Sa who indicated only total shoulder likely to be of benefit.    Subjective: Chief Complaint  Patient presents with  . Lower Back - Pain, Follow-up    Patient returns as 1 month recheck low back pain. Patient states no changes with his low back pain. States he will have to wait to schedule any surgery due  to his wife being laid off at her job and not having insurance.Pain in both shoulders right greater than left, pain with IR of the shoulders, trouble with wiping after toilet. No numbness or paresthesias in the arms. C/o numbness in the feet. Dr. Kritzer/Bloomquist in 1990s underwent cervical fusion in the past in the upper and anterior. Sleeps sitting in a recliner as he is painful in the lumbar spine when lying flat. No snoring. Bowel and bladder are with diarrhea daily in the AM.  Can walk one mile with a stick. No garden this year. Wife laid off from her job, so he can not really consider surgery for stenosis or for his shoulder. Does Not want any injections. Desires to continue with narcotics. He is taking hydrocodone one every other day or so.    Review of Systems  Constitutional: Positive for unexpected weight change. Negative for activity change, appetite change, chills, diaphoresis and fatigue.  HENT: Positive for congestion, dental problem, postnasal drip, rhinorrhea and sneezing. Negative for ear discharge, ear pain and sore throat.   Eyes: Positive for visual disturbance. Negative for pain, discharge, redness and itching.  Respiratory: Positive for cough, shortness of breath and wheezing. Negative for apnea, choking, chest tightness and stridor.   Cardiovascular: Negative.   Gastrointestinal: Positive for diarrhea. Negative for abdominal distention, abdominal pain, anal bleeding, blood in stool, constipation,  nausea, rectal pain and vomiting.  Genitourinary: Negative for difficulty urinating, dysuria, enuresis, flank pain and frequency.  Musculoskeletal: Positive for back pain, gait problem, neck pain and neck stiffness. Negative for joint swelling.  Skin: Negative.   Allergic/Immunologic: Negative for environmental allergies, food allergies and immunocompromised state.  Neurological: Positive for weakness and numbness. Negative for seizures, syncope, facial asymmetry, speech difficulty,  light-headedness and headaches.  Hematological: Negative.   Psychiatric/Behavioral: Negative for agitation, behavioral problems, confusion, decreased concentration, dysphoric mood, hallucinations, self-injury, sleep disturbance and suicidal ideas. The patient is not nervous/anxious and is not hyperactive.      Objective: Vital Signs: BP 134/86   Pulse 89   Ht 5\' 11"  (1.803 m)   Wt 281 lb (127.5 kg)   BMI 39.19 kg/m   Physical Exam  Constitutional: He is oriented to person, place, and time. He appears well-developed and well-nourished.  HENT:  Head: Normocephalic and atraumatic.  Eyes: EOM are normal. Pupils are equal, round, and reactive to light.  Neck: Neck supple. No JVD present. No tracheal deviation present. No thyromegaly present.  Pulmonary/Chest: Effort normal and breath sounds normal.  Abdominal: Soft. Bowel sounds are normal.  Musculoskeletal: He exhibits no edema, tenderness or deformity.  Lymphadenopathy:    He has no cervical adenopathy.  Neurological: He is alert and oriented to person, place, and time. He displays abnormal reflex. No cranial nerve deficit. He exhibits abnormal muscle tone. Coordination normal.  Skin: Skin is warm and dry.  Psychiatric: He has a normal mood and affect. His behavior is normal. Judgment and thought content normal.    Back Exam   Range of Motion  Extension: abnormal  Flexion: normal  Lateral Bend Right: normal  Lateral Bend Left: normal  Rotation Right: normal  Rotation Left: normal   Muscle Strength  Right Quadriceps:  5/5  Left Quadriceps:  5/5  Right Hamstrings:  5/5  Left Hamstrings:  5/5   Tests  Straight leg raise right: negative Straight leg raise left: negative  Reflexes  Patellar: Hyporeflexic Achilles: Hyporeflexic Biceps: normal Babinski's sign: normal   Other  Toe Walk: normal Heel Walk: normal Sensation: normal Gait: antalgic  Erythema: no back redness Scars: present  Comments:  Painful ROM of  the shoulders.   Right Shoulder Exam   Tenderness  The patient is experiencing tenderness in the biceps tendon and acromion.  Range of Motion  Active Abduction: abnormal  Passive Abduction: abnormal  Extension: abnormal  Forward Flexion: 70  External Rotation:  50 abnormal   Muscle Strength  Abduction: 4/5  Internal Rotation: 4/5  External Rotation: 5/5  Supraspinatus: 5/5  Subscapularis: 5/5  Biceps: 5/5   Tests  Apprehension: positive Cross Arm: positive Impingement: positive  Other  Erythema: absent Scars: absent Sensation: decreased   Left Shoulder Exam   Range of Motion  Active Abduction: abnormal  Passive Abduction: abnormal  Extension: normal  External Rotation: normal  Internal Rotation 0 degrees: abnormal  Internal Rotation 90 degrees: abnormal   Muscle Strength  Abduction: 4/5  Internal Rotation: 4/5  External Rotation: 5/5  Supraspinatus: 5/5  Subscapularis: 5/5  Biceps: 5/5   Tests  Apprehension: positive Impingement: positive  Other  Sensation: decreased   Comments:  Left hand the tips of fingers with decreased sensation.      Specialty Comments:  No specialty comments available.  Imaging: No results found.   PMFS History: Patient Active Problem List   Diagnosis Date Noted  . Osteoarthritis of left hip 09/13/2012  Priority: High    Class: Chronic  . Periprosthetic fracture around internal prosthetic left hip joint ([place]) 09/13/2012    Priority: High    Class: Acute  . Postoperative anemia due to acute blood loss 09/12/2012    Priority: Medium    Class: Acute  . Bilateral inguinal hernia 11/08/2012  . Exertional dyspnea 06/12/2012  . Obesity 02/28/2012  . Chronic back pain 02/28/2012  . Osteoarthritis 10/15/2007  . GASTROENTERITIS 08/28/2007  . GERD 07/13/2007  . CELLULITIS, RIGHT LEG 06/26/2007  . SINUSITIS 09/12/2006  . SEBACEOUS CYST, INFECTED 08/24/2006  . Type 2 diabetes mellitus without complications  ([place]) [id number]  . Dyslipidemia 08/02/2006  . Essential hypertension 08/02/2006  . History of colonic polyps 08/02/2006   Past Medical History:  Diagnosis Date  . Adenomatous polyp   . Cataract    left eye for sure   . Chronic pain   . Diabetes mellitus   . GERD (gastroesophageal reflux disease)   . History of colonic polyps   . History of hip replacement, total   . Hx of anaphylactic shock   . Hyperlipidemia   . Hypertension   . Left anterior hemiblock   . Obesity   . RBBB (right bundle branch block with left anterior fascicular block)   . 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 Cancer Mother     lung  . Crohn's disease Father   . Alcohol abuse Brother   . Colon polyps Brother   . Colon cancer Neg Hx     Past Surgical History:  Procedure Laterality Date  . APPENDECTOMY    . CERVICAL FUSION    . CERVICAL LAMINECTOMY    . COLONOSCOPY    . HAND SURGERY    . HARDWARE REMOVAL Left 09/10/2012   Procedure: HARDWARE REMOVAL;  Surgeon: Jessy Oto, MD;  Location: [facility];  Service: Orthopedics;  Laterality: Left;  . HIP SURGERY  12/29/09   R THR  (Caliyah Sieh)  . HIP SURGERY     bilateral  . TOTAL HIP ARTHROPLASTY Left 09/10/2012   Procedure: REMOVAL OF HARDWARE LEFT PROXIMAL THIGH/FEMUR AND LEFT TOTAL HIP ARTHROPLASTY;  Surgeon: Jessy Oto, MD;  Location: [facility];  Service: Orthopedics;  Laterality: Left;  . TOTAL KNEE ARTHROPLASTY  01/2008   left knee   Social History   Occupational History  . Not on file.   Social History Main Topics  . Smoking status: Former Smoker    Packs/day: 1.50    Years: 42.00    Types: Cigarettes    Quit date: 09/03/2013  . Smokeless tobacco: Former Systems developer    Types: Chew     Comment: We discussed the need to remain smoke free and to call me for help if he has the desire to smoke again.  . Alcohol use No     Comment: recovering alcoholic   . Drug use: No  . Sexual activity: Not on  file

## 2015-11-05 NOTE — Patient Instructions (Signed)
Avoid overhead use of arms and overhead lifting. No lifting greater than 10 lbs. Do stretching exercises of the shoulders. Walk as tolerated and use a stationary bike for exercise. Yogurt daily. Flexion exercises for the lumbar spine. Ice or moist heat for shoulders and back Return in one month. Okay to make appt with [facility] in [place] for assistance with health care maintenance.

## 2015-11-14 ENCOUNTER — Other Ambulatory Visit: Payer: Self-pay | Admitting: Internal Medicine

## 2015-11-25 ENCOUNTER — Other Ambulatory Visit: Payer: Self-pay | Admitting: Internal Medicine

## 2015-12-16 ENCOUNTER — Telehealth: Payer: Self-pay | Admitting: Internal Medicine

## 2015-12-16 MED ORDER — INSULIN LISPRO 100 UNIT/ML (KWIKPEN): PEN_INJECTOR | SUBCUTANEOUS | 0 refills | Status: DC

## 2015-12-16 NOTE — Telephone Encounter (Signed)
[facility] is [facility] and needs samples of humalog kwikpen 100 units.

## 2015-12-16 NOTE — Telephone Encounter (Signed)
Pt given 2 pens of Humalog Kwikpen 100 units.  Documented.

## 2016-01-13 ENCOUNTER — Ambulatory Visit (INDEPENDENT_AMBULATORY_CARE_PROVIDER_SITE_OTHER): Payer: Medicare Other | Admitting: Internal Medicine

## 2016-01-13 ENCOUNTER — Encounter: Payer: Self-pay | Admitting: Internal Medicine

## 2016-01-13 VITALS — BP 152/72 | HR 88 | Temp 98.2°F | Ht 71.0 in | Wt 289.2 lb

## 2016-01-13 DIAGNOSIS — Z794 Long term (current) use of insulin: Secondary | ICD-10-CM

## 2016-01-13 DIAGNOSIS — I10 Essential (primary) hypertension: Secondary | ICD-10-CM | POA: Diagnosis not present

## 2016-01-13 DIAGNOSIS — E119 Type 2 diabetes mellitus without complications: Secondary | ICD-10-CM

## 2016-01-13 NOTE — Patient Instructions (Addendum)
Limit your sodium (Salt) intake    It is important that you exercise regularly, at least 20 minutes 3 to 4 times per week.  If you develop chest pain or shortness of breath seek  medical attention.  You need to lose weight.  Consider a lower calorie diet and regular exercise.   Please check your hemoglobin A1c every 3 months I 3 months

## 2016-01-13 NOTE — Progress Notes (Signed)
Subjective:    Patient ID: [name], male    DOB: 1955-04-10, 61 y.o.   MRN: [id number]  HPI  Wt Readings from Last 3 Encounters:  01/13/16 289 lb 3.2 oz (131.2 kg)  11/05/15 281 lb (127.5 kg)  10/27/15 285 lb 8 oz (129.5 kg)    Lab Results  Component Value Date   HGBA1C 7.8 (H) 10/27/2015   61 year old patient who is seen today for follow-up of diabetes.  He has morbid obesity, hypertension and dyslipidemia.  There is been some weight gain over the holidays. He is having some financial issues with insulin therapy.  His wife has recently lost her job. No hypoglycemia.  Since his last hemoglobin A1c insulin therapy has been uptitrated.  Past Medical History:  Diagnosis Date  . Adenomatous polyp   . Cataract    left eye for sure   . Chronic pain   . Diabetes mellitus   . GERD (gastroesophageal reflux disease)   . History of colonic polyps   . History of hip replacement, total   . Hx of anaphylactic shock   . Hyperlipidemia   . Hypertension   . Left anterior hemiblock   . Obesity   . RBBB (right bundle branch block with left anterior fascicular block)   . 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 Marital status: Married    Spouse name: N/A  . Number of children: N/A  . Years of education: N/A   Occupational History  . Not on file.   Social History Main Topics  . Smoking status: Former Smoker    Packs/day: 1.50    Years: 42.00    Types: Cigarettes    Quit date: 09/03/2013  . Smokeless tobacco: Former Systems developer    Types: Chew     Comment: We discussed the need to remain smoke free and to call me for help if he has the desire to smoke again.  . Alcohol use No     Comment: recovering alcoholic   . Drug use: No  . Sexual activity: Not on file   Other Topics Concern  . Not on file   Social History Narrative   Recovering alcoholic      Minister     Past Surgical History:  Procedure Laterality Date  .  APPENDECTOMY    . CERVICAL FUSION    . CERVICAL LAMINECTOMY    . COLONOSCOPY    . HAND SURGERY    . HARDWARE REMOVAL Left 09/10/2012   Procedure: HARDWARE REMOVAL;  Surgeon: Jessy Oto, MD;  Location: [facility];  Service: Orthopedics;  Laterality: Left;  . HIP SURGERY  12/29/09   R THR  ([place])  . HIP SURGERY     bilateral  . TOTAL HIP ARTHROPLASTY Left 09/10/2012   Procedure: REMOVAL OF HARDWARE LEFT PROXIMAL THIGH/FEMUR AND LEFT TOTAL HIP ARTHROPLASTY;  Surgeon: Jessy Oto, MD;  Location: [facility];  Service: Orthopedics;  Laterality: Left;  . TOTAL KNEE ARTHROPLASTY  01/2008   left knee    Family History  Problem Relation Age of Onset  . Cancer Mother     lung  . Crohn's disease Father   . Alcohol abuse Brother   . Colon polyps Brother   . Colon cancer Neg Hx     Allergies  Allergen Reactions  . Codeine     REACTION: itch  . Morphine And Related Nausea And  Vomiting  . Naproxen     REACTION: anaphylaxis    Current Outpatient Prescriptions on File Prior to Visit  Medication Sig Dispense Refill  . atorvastatin (LIPITOR) 40 MG tablet Take 1 tablet by mouth  daily 90 tablet 1  . Blood Glucose Monitoring Suppl (ONE TOUCH ULTRA SYSTEM KIT) W/DEVICE KIT 1 kit by Does not apply route once. 1 each 0  . diclofenac sodium (VOLTAREN) 1 % GEL Apply topically.      . fexofenadine (ALLEGRA) 180 MG tablet Take 1 tablet (180 mg total) by mouth daily. 90 tablet 3  . HYDROcodone-acetaminophen (NORCO) 7.5-325 MG tablet Take 1-2 tablets by mouth every 4 (four) hours as needed. 60 tablet 0  . Insulin Detemir (LEVEMIR FLEXTOUCH) 100 UNIT/ML Pen Inject 80 Units into the skin daily at 10 pm. 60 mL 1  . Insulin Detemir (LEVEMIR FLEXTOUCH) 100 UNIT/ML Pen Inject 80 Units into the skin daily at 10 pm. 30 pen 1  . insulin lispro (HUMALOG KWIKPEN) 100 UNIT/ML KiwkPen 20 UNITS PRIOR TO BREAKFAST AND LUNCH AND 32 UNITS PRIOR TO YOUR EVENING MEAL IF BLOOD SUGAR IS GREATER THAN 180, ADD AN ADDITIONAL 4 UNITS  15 pen 3  . insulin lispro (HUMALOG KWIKPEN) 100 UNIT/ML KiwkPen 20 UNITS PRIOR TO BREAKFAST AND LUNCH AND 32 UNITS PRIOR TO YOUR EVENING MEAL IF BLOOD SUGAR IS GREATER THAN 180, ADD AN ADDITIONAL 4 UNITS, 2 pen 0  . Insulin Pen Needle (NOVOTWIST) 32G X 5 MM MISC USE TO INJECT INSULIN THREE TIMES A DAY 100 each 4  . lidocaine (LIDODERM) 5 % Place 1 patch onto the skin as needed.     Marland Kitchen lisinopril-hydrochlorothiazide (PRINZIDE,ZESTORETIC) 20-25 MG tablet Take 1 tablet by mouth  daily 90 tablet 1  . loratadine (CLARITIN) 10 MG tablet Take 10 mg by mouth daily.    . metFORMIN (GLUCOPHAGE) 1000 MG tablet Take 1 tablet by mouth  twice a day with meals 180 tablet 1  . methocarbamol (ROBAXIN) 750 MG tablet Take 1 tablet by mouth  every 6 hours as needed 90 tablet 1  . Multiple Vitamins-Minerals (MULTIVITAMIN WITH MINERALS) tablet Take 1 tablet by mouth daily.    Marland Kitchen Nystatin (NYAMYC) 100000 UNIT/GM POWD Apply 1 Bottle topically as needed.     Marland Kitchen omeprazole (PRILOSEC) 20 MG capsule TAKE 1 CAPSULE BY MOUTH  DAILY 90 capsule 3  . ONE TOUCH ULTRA TEST test strip Check blood sugar daily and as needed 200 each 4  . promethazine (PHENERGAN) 25 MG tablet Take 1 tablet by mouth  every 6 hours as needed for nausea or vomiting 180 tablet 2   No current facility-administered medications on file prior to visit.     BP (!) 152/72 (BP Location: Right Arm, Patient Position: Sitting, Cuff Size: Normal)   Pulse 88   Temp 98.2 F (36.8 C) (Oral)   Ht 5\' 11"  (1.803 m)   Wt 289 lb 3.2 oz (131.2 kg)   SpO2 97%   BMI 40.34 kg/m     Review of Systems  Constitutional: Positive for unexpected weight change. Negative for appetite change, chills, fatigue and fever.  HENT: Negative for congestion, dental problem, ear pain, hearing loss, sore throat, tinnitus, trouble swallowing and voice change.   Eyes: Negative for pain, discharge and visual disturbance.  Respiratory: Negative for cough, chest tightness, wheezing and  stridor.   Cardiovascular: Negative for chest pain, palpitations and leg swelling.  Gastrointestinal: Negative for abdominal distention, abdominal pain, blood in stool, constipation, diarrhea,  nausea and vomiting.  Genitourinary: Negative for difficulty urinating, discharge, flank pain, genital sores, hematuria and urgency.  Musculoskeletal: Positive for arthralgias, back pain and neck pain. Negative for gait problem, joint swelling, myalgias and neck stiffness.  Skin: Negative for rash.  Neurological: Negative for dizziness, syncope, speech difficulty, weakness, numbness and headaches.  Hematological: Negative for adenopathy. Does not bruise/bleed easily.  Psychiatric/Behavioral: Negative for behavioral problems and dysphoric mood. The patient is not nervous/anxious.        Objective:   Physical Exam  Constitutional: He is oriented to person, place, and time. He appears well-developed and well-nourished. No distress.  Weight 289 Blood pressure 140/72 O2 saturation 97  HENT:  Head: Normocephalic.  Right Ear: External ear normal.  Left Ear: External ear normal.  Eyes: Conjunctivae and EOM are normal.  Neck: Normal range of motion.  Cardiovascular: Normal rate and normal heart sounds.   Pulmonary/Chest: Breath sounds normal.  Abdominal: Bowel sounds are normal.  Musculoskeletal: Normal range of motion. He exhibits no edema or tenderness.  Neurological: He is alert and oriented to person, place, and time.  Psychiatric: He has a normal mood and affect. His behavior is normal.          Assessment & Plan:   Diabetes mellitus.  Weight loss encouraged.  No change in medical regimen.  Will check hemoglobin A1c at next visit Hypertension, stable Dyslipidemia Morbid obesity.  Weight loss encouraged  Patient is having difficulty affording present medical regimen.  He will check with his new health plan about alternative medication;  may need to switch to generic, regular insulin in a  vial; he is agreeable to this.  Insulin injection technique  Nyoka Cowden

## 2016-01-13 NOTE — Progress Notes (Signed)
Pre visit review using our clinic review tool, if applicable. No additional management support is needed unless otherwise documented below in the visit note. 

## 2016-01-26 ENCOUNTER — Telehealth: Payer: Self-pay | Admitting: Internal Medicine

## 2016-01-26 NOTE — Telephone Encounter (Signed)
Pt would like clarification on which type of insulin he should be on. [facility] offers : Novloin R, Novolin 70/30 and, Novolin N for $24/vial.. He would also like an order for insulin syringes, pt is currently on Humalog

## 2016-01-26 NOTE — Telephone Encounter (Signed)
Novolin 70/30   40 units prior to breakfast and 40 units prior to evening meal Follow-up office visit 4 weeks

## 2016-01-27 ENCOUNTER — Ambulatory Visit: Payer: 59 | Admitting: Internal Medicine

## 2016-01-27 MED ORDER — INSULIN NPH ISOPHANE & REGULAR (70-30) 100 UNIT/ML ~~LOC~~ SUSP: SUBCUTANEOUS | 11 refills | Status: DC

## 2016-01-27 NOTE — Addendum Note (Signed)
Addended by: Abelardo Diesel on: 01/27/2016 11:25 AM   Modules accepted: Orders

## 2016-02-04 ENCOUNTER — Ambulatory Visit (INDEPENDENT_AMBULATORY_CARE_PROVIDER_SITE_OTHER): Payer: Medicare Other | Admitting: Specialist

## 2016-02-16 ENCOUNTER — Telehealth (INDEPENDENT_AMBULATORY_CARE_PROVIDER_SITE_OTHER): Payer: Self-pay | Admitting: Specialist

## 2016-02-16 NOTE — Telephone Encounter (Signed)
Rx refill Hydrocodone °

## 2016-02-17 ENCOUNTER — Other Ambulatory Visit (INDEPENDENT_AMBULATORY_CARE_PROVIDER_SITE_OTHER): Payer: Self-pay | Admitting: Specialist

## 2016-02-17 DIAGNOSIS — M778 Other enthesopathies, not elsewhere classified: Secondary | ICD-10-CM

## 2016-02-17 DIAGNOSIS — M47812 Spondylosis without myelopathy or radiculopathy, cervical region: Secondary | ICD-10-CM

## 2016-02-17 DIAGNOSIS — M19012 Primary osteoarthritis, left shoulder: Secondary | ICD-10-CM

## 2016-02-17 DIAGNOSIS — M7581 Other shoulder lesions, right shoulder: Secondary | ICD-10-CM

## 2016-02-17 DIAGNOSIS — M19011 Primary osteoarthritis, right shoulder: Secondary | ICD-10-CM

## 2016-02-17 DIAGNOSIS — M48062 Spinal stenosis, lumbar region with neurogenic claudication: Secondary | ICD-10-CM

## 2016-02-17 MED ORDER — HYDROCODONE-ACETAMINOPHEN 7.5-325 MG PO TABS: 1.0000 | ORAL_TABLET | ORAL | 0 refills | Status: DC | PRN

## 2016-02-17 NOTE — Progress Notes (Signed)
Left message on machine that his rx was ready for pick up

## 2016-02-17 NOTE — Telephone Encounter (Signed)
Left message on machine that his rx was ready for pick up.

## 2016-02-17 NOTE — Telephone Encounter (Signed)
Rx for Hydrocodone renewed for #[id number]

## 2016-03-11 ENCOUNTER — Ambulatory Visit (INDEPENDENT_AMBULATORY_CARE_PROVIDER_SITE_OTHER): Payer: Medicare Other | Admitting: Specialist

## 2016-03-16 ENCOUNTER — Encounter (INDEPENDENT_AMBULATORY_CARE_PROVIDER_SITE_OTHER): Payer: Self-pay | Admitting: Specialist

## 2016-03-16 ENCOUNTER — Encounter (INDEPENDENT_AMBULATORY_CARE_PROVIDER_SITE_OTHER): Payer: Self-pay

## 2016-03-16 ENCOUNTER — Ambulatory Visit (INDEPENDENT_AMBULATORY_CARE_PROVIDER_SITE_OTHER): Payer: Medicare Other | Admitting: Specialist

## 2016-03-16 VITALS — BP 146/82 | Ht 71.0 in | Wt 281.0 lb

## 2016-03-16 DIAGNOSIS — M48062 Spinal stenosis, lumbar region with neurogenic claudication: Secondary | ICD-10-CM | POA: Diagnosis not present

## 2016-03-16 DIAGNOSIS — M5441 Lumbago with sciatica, right side: Secondary | ICD-10-CM | POA: Diagnosis not present

## 2016-03-16 DIAGNOSIS — M19012 Primary osteoarthritis, left shoulder: Secondary | ICD-10-CM | POA: Diagnosis not present

## 2016-03-16 DIAGNOSIS — M19011 Primary osteoarthritis, right shoulder: Secondary | ICD-10-CM | POA: Diagnosis not present

## 2016-03-16 MED ORDER — BUPIVACAINE HCL 0.25 % IJ SOLN
4.0000 mL | INTRAMUSCULAR | Status: AC | PRN
  Administered 2016-03-16: 4 mL via INTRA_ARTICULAR

## 2016-03-16 MED ORDER — METHYLPREDNISOLONE ACETATE 40 MG/ML IJ SUSP
40.0000 mg | INTRAMUSCULAR | Status: AC | PRN
  Administered 2016-03-16: 40 mg via INTRA_ARTICULAR

## 2016-03-16 MED ORDER — TRAMADOL HCL 50 MG PO TABS: 50.0000 mg | ORAL_TABLET | Freq: Four times a day (QID) | ORAL | 0 refills | Status: DC | PRN

## 2016-03-16 NOTE — Addendum Note (Signed)
Addended by: Basil Dess on: 03/16/2016 10:51 AM   Modules accepted: Orders

## 2016-03-16 NOTE — Progress Notes (Signed)
Office Visit Note   Patient: [name]           Date of Birth: [birth year]           MRN: [id number] Visit Date: 03/16/2016              Requested by: Marletta Lor, MD [place],  [id number] PCP: Nyoka Cowden, MD   Assessment & Plan: Visit Diagnoses:  1. Primary osteoarthritis of both shoulders   2. Spinal stenosis of lumbar region with neurogenic claudication   3. Acute low back pain with right-sided sciatica, unspecified back pain laterality     Plan:Avoid bending, stooping and avoid lifting weights greater than 10 lbs. Avoid prolong standing and walking. Avoid frequent bending and stooping  No lifting greater than 10 lbs. May use ice or moist heat for pain. Weight loss is of benefit. Handicap license is approved. Avoid frequent bending and stooping  No lifting greater than 10 lbs. May use ice or moist heat for pain. Weight loss is of benefit.    Follow-Up Instructions: Return in about 3 months (around 06/16/2016).   Orders:  Orders Placed This Encounter  Procedures  . Large Joint Injection/Arthrocentesis  . Large Joint Injection/Arthrocentesis   No orders of the defined types were placed in this encounter.     Procedures: Large Joint Inj Date/Time: 03/16/2016 10:40 AM Performed by: Jessy Oto Authorized by: Jessy Oto   Consent Given by:  Patient Site marked: the procedure site was marked   Timeout: prior to procedure the correct patient, procedure, and site was verified   Indications:  Pain Location:  Shoulder Site:  R glenohumeral Prep: patient was prepped and draped in usual sterile fashion   Needle Size:  25 G Needle Length:  1.5 inches Approach:  Anterolateral Ultrasound Guidance: No   Fluoroscopic Guidance: No   Arthrogram: No   Medications:  40 mg methylPREDNISolone acetate 40 MG/ML; 4 mL bupivacaine 0.25 % Aspiration Attempted: Yes   Patient tolerance:  Patient tolerated the procedure  well with no immediate complications  Band aid applied Large Joint Inj Date/Time: 03/16/2016 10:42 AM Performed by: Jessy Oto Authorized by: Jessy Oto   Consent Given by:  Patient Site marked: the procedure site was marked   Timeout: prior to procedure the correct patient, procedure, and site was verified   Indications:  Pain and joint swelling Location:  Shoulder Site:  L glenohumeral Prep: patient was prepped and draped in usual sterile fashion   Needle Size:  25 G Needle Length:  1.5 inches Approach:  Anterolateral Ultrasound Guidance: No   Fluoroscopic Guidance: No   Arthrogram: No   Medications:  4 mL bupivacaine 0.25 %; 40 mg methylPREDNISolone acetate 40 MG/ML Aspiration Attempted: No   Patient tolerance:  Patient tolerated the procedure well with no immediate complications  Band aid applied     Clinical Data: No additional findings.   Subjective: Chief Complaint  Patient presents with  . Right Shoulder - Pain  . Left Shoulder - Pain  . Lower Back - Pain    [name] is here to follow up on his bilateral shoulder pain and low back pain.  He states that he would like an injection in both shoulders if possible and if not then he would like one at least in the left shoulder, states it has really been bothering him.  As far as his low back, he was incapacitated for a week due  to the pain.  He states that he has been moving and he has been more active than he has been in the last 5-6 years.  He states that he does overall fell better.Pain in the left shoulder worse with lying on the left side. Pain with overhead use of the left and right shoulder. He is planning to have surgery for his shoulders by Dr. Marlou Sa when he has insurance. He recently has been employed by congregation as Psychologist, prison and probation services. His wife is still unemployed but hopeful for new job soon. No numbness. Pain in the lumbar spine worsened recently with a bend.  Previous studies show L2-3 and L3-4 and L4-5  spinal stenosis which is severe. He is unable to consider surgery due to current situation. No bowel or bladder difficulty.    Review of Systems  Constitutional: Negative.   HENT: Negative.   Eyes: Negative.   Respiratory: Negative.   Cardiovascular: Negative.   Gastrointestinal: Negative.   Endocrine: Negative.   Genitourinary: Negative.   Musculoskeletal: Negative.   Skin: Negative.   Allergic/Immunologic: Negative.   Neurological: Negative.   Hematological: Negative.   Psychiatric/Behavioral: Negative.      Objective: Vital Signs: BP (!) 146/82 (BP Location: Left Arm, Patient Position: Sitting)   Ht 5\' 11"  (1.803 m)   Wt 281 lb (127.5 kg)   BMI 39.19 kg/m   Physical Exam  Constitutional: He is oriented to person, place, and time. He appears well-developed and well-nourished.  HENT:  Head: Normocephalic and atraumatic.  Eyes: EOM are normal. Pupils are equal, round, and reactive to light.  Neck: Normal range of motion. Neck supple.  Pulmonary/Chest: Effort normal and breath sounds normal.  Abdominal: Soft. Bowel sounds are normal.  Neurological: He is alert and oriented to person, place, and time.  Skin: Skin is warm and dry.  Psychiatric: He has a normal mood and affect. His behavior is normal. Judgment and thought content normal.    Right Shoulder Exam   Tenderness  The patient is experiencing tenderness in the acromioclavicular joint.  Range of Motion  Active Abduction: abnormal  Passive Abduction: abnormal  Extension: abnormal  Forward Flexion: abnormal  External Rotation: abnormal  Internal Rotation 0 degrees: L2  Internal Rotation 90 degrees: 70   Muscle Strength  Abduction: 5/5  Internal Rotation: 5/5  External Rotation: 5/5  Supraspinatus: 5/5  Subscapularis: 5/5  Biceps: 5/5   Tests  Apprehension: negative Cross Arm: negative Drop Arm: negative  Other  Erythema: absent Scars: absent Sensation: normal Pulse: present  Comments:   Grating and painful ROM of theright shoulder   Left Shoulder Exam   Tenderness  The patient is experiencing tenderness in the acromioclavicular joint and acromion.  Range of Motion  Active Abduction:  120 abnormal  Passive Abduction: 130  Extension: 10  Forward Flexion: 70  External Rotation: 60  Internal Rotation 0 degrees: L2   Muscle Strength  Abduction: 5/5  Internal Rotation: 5/5  External Rotation: 5/5  Supraspinatus: 5/5  Subscapularis: 5/5  Biceps: 5/5   Comments:  Grating and popping with ROM of the left shoulder.       Specialty Comments:  No specialty comments available.  Imaging: No results found.   PMFS History: Patient Active Problem List   Diagnosis Date Noted  . Osteoarthritis of left hip 09/13/2012    Priority: High    Class: Chronic  . Periprosthetic fracture around internal prosthetic left hip joint ([place]) 09/13/2012    Priority: High  Class: Acute  . Postoperative anemia due to acute blood loss 09/12/2012    Priority: Medium    Class: Acute  . Bilateral inguinal hernia 11/08/2012  . Exertional dyspnea 06/12/2012  . Obesity 02/28/2012  . Chronic back pain 02/28/2012  . Osteoarthritis 10/15/2007  . GASTROENTERITIS 08/28/2007  . GERD 07/13/2007  . CELLULITIS, RIGHT LEG 06/26/2007  . SINUSITIS 09/12/2006  . SEBACEOUS CYST, INFECTED 08/24/2006  . Type 2 diabetes mellitus without complications ([place]) 13/08/6576  . Dyslipidemia 08/02/2006  . Essential hypertension 08/02/2006  . History of colonic polyps 08/02/2006   Past Medical History:  Diagnosis Date  . Adenomatous polyp   . Cataract    left eye for sure   . Chronic pain   . Diabetes mellitus   . GERD (gastroesophageal reflux disease)   . History of colonic polyps   . History of hip replacement, total   . Hx of anaphylactic shock   . Hyperlipidemia   . Hypertension   . Left anterior hemiblock   . Obesity   . RBBB (right bundle branch block with left anterior fascicular  block)   . 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 Cancer Mother     lung  . Crohn's disease Father   . Alcohol abuse Brother   . Colon polyps Brother   . Colon cancer Neg Hx     Past Surgical History:  Procedure Laterality Date  . APPENDECTOMY    . CERVICAL FUSION    . CERVICAL LAMINECTOMY    . COLONOSCOPY    . HAND SURGERY    . HARDWARE REMOVAL Left 09/10/2012   Procedure: HARDWARE REMOVAL;  Surgeon: Jessy Oto, MD;  Location: [facility];  Service: Orthopedics;  Laterality: Left;  . HIP SURGERY  12/29/09   R THR  (Derion Kreiter)  . HIP SURGERY     bilateral  . TOTAL HIP ARTHROPLASTY Left 09/10/2012   Procedure: REMOVAL OF HARDWARE LEFT PROXIMAL THIGH/FEMUR AND LEFT TOTAL HIP ARTHROPLASTY;  Surgeon: Jessy Oto, MD;  Location: [facility];  Service: Orthopedics;  Laterality: Left;  . TOTAL KNEE ARTHROPLASTY  01/2008   left knee   Social History   Occupational History  . Not on file.   Social History Main Topics  . Smoking status: Former Smoker    Packs/day: 1.50    Years: 42.00    Types: Cigarettes    Quit date: 09/03/2013  . Smokeless tobacco: Former Systems developer    Types: Chew     Comment: We discussed the need to remain smoke free and to call me for help if he has the desire to smoke again.  . Alcohol use No     Comment: recovering alcoholic   . Drug use: No  . Sexual activity: Not on file

## 2016-03-16 NOTE — Patient Instructions (Signed)
Avoid bending, stooping and avoid lifting weights greater than 10 lbs. Avoid prolong standing and walking. Avoid frequent bending and stooping  No lifting greater than 10 lbs. May use ice or moist heat for pain. Weight loss is of benefit. Handicap license is approved. Avoid frequent bending and stooping  No lifting greater than 10 lbs. May use ice or moist heat for pain. Weight loss is of benefit.

## 2016-05-04 ENCOUNTER — Telehealth: Payer: Self-pay | Admitting: Internal Medicine

## 2016-05-04 MED ORDER — LISINOPRIL-HYDROCHLOROTHIAZIDE 20-25 MG PO TABS
1.0000 | ORAL_TABLET | Freq: Every day | ORAL | 1 refills | Status: DC
Start: 2016-05-04 — End: 2020-06-02

## 2016-05-04 MED ORDER — ATORVASTATIN CALCIUM 40 MG PO TABS: 40.0000 mg | ORAL_TABLET | Freq: Every day | ORAL | 1 refills | Status: DC

## 2016-05-04 MED ORDER — METFORMIN HCL 1000 MG PO TABS: 1000.0000 mg | ORAL_TABLET | Freq: Two times a day (BID) | ORAL | 1 refills | Status: DC

## 2016-05-04 NOTE — Telephone Encounter (Signed)
Pt needs 30 day supply sent to [facility] on [address] metformin 1000 mg #60, lisinopril-hctz 20-25 mg #30 and atorvastatin 40 mg #30. Pt has an appt  05-25-16.

## 2016-05-04 NOTE — Telephone Encounter (Signed)
Mediations were refilled.  

## 2016-05-25 ENCOUNTER — Encounter: Payer: Self-pay | Admitting: Internal Medicine

## 2016-05-25 ENCOUNTER — Ambulatory Visit (INDEPENDENT_AMBULATORY_CARE_PROVIDER_SITE_OTHER): Payer: 59 | Admitting: Internal Medicine

## 2016-05-25 VITALS — BP 132/72 | HR 90 | Temp 98.6°F | Wt 291.2 lb

## 2016-05-25 DIAGNOSIS — G8929 Other chronic pain: Secondary | ICD-10-CM | POA: Diagnosis not present

## 2016-05-25 DIAGNOSIS — M150 Primary generalized (osteo)arthritis: Secondary | ICD-10-CM | POA: Diagnosis not present

## 2016-05-25 DIAGNOSIS — M549 Dorsalgia, unspecified: Secondary | ICD-10-CM

## 2016-05-25 DIAGNOSIS — E785 Hyperlipidemia, unspecified: Secondary | ICD-10-CM | POA: Diagnosis not present

## 2016-05-25 DIAGNOSIS — M159 Polyosteoarthritis, unspecified: Secondary | ICD-10-CM

## 2016-05-25 DIAGNOSIS — Z6841 Body Mass Index (BMI) 40.0 and over, adult: Secondary | ICD-10-CM

## 2016-05-25 DIAGNOSIS — M8949 Other hypertrophic osteoarthropathy, multiple sites: Secondary | ICD-10-CM

## 2016-05-25 DIAGNOSIS — E119 Type 2 diabetes mellitus without complications: Secondary | ICD-10-CM

## 2016-05-25 LAB — POCT GLYCOSYLATED HEMOGLOBIN (HGB A1C): Hemoglobin A1C: 8.2

## 2016-05-25 MED ORDER — INSULIN LISPRO 200 UNIT/ML ~~LOC~~ SOPN: 30.0000 [IU] | PEN_INJECTOR | Freq: Three times a day (TID) | SUBCUTANEOUS | 6 refills | Status: DC

## 2016-05-25 MED ORDER — INSULIN DETEMIR 100 UNIT/ML FLEXPEN: PEN_INJECTOR | SUBCUTANEOUS | 1 refills | Status: DC

## 2016-05-25 NOTE — Patient Instructions (Addendum)
WE NOW OFFER   [name]'s FAST TRACK!!!  SAME DAY Appointments for ACUTE CARE  Such as: Sprains, Injuries, cuts, abrasions, rashes, muscle pain, joint pain, back pain Colds, flu, sore throats, headache, allergies, cough, fever  Ear pain, sinus and eye infections Abdominal pain, nausea, vomiting, diarrhea, upset stomach Animal/insect bites  3 Easy Ways to Schedule: Walk-In Scheduling Call in scheduling Mychart Sign-up: [email or web address]   Limit your sodium (Salt) intake   Please check your hemoglobin A1c every 3 months  Please check your blood pressure on a regular basis.  If it is consistently greater than 150/90, please make an office appointment.  You need to lose weight.  Consider a lower calorie diet and regular exercise.

## 2016-05-26 ENCOUNTER — Encounter: Payer: Self-pay | Admitting: Internal Medicine

## 2016-05-26 NOTE — Progress Notes (Signed)
Subjective:    Patient ID: [name], male    DOB: [birth year], 61 y.o.   MRN: [id number]  HPI  61 year old patient who is seen today for follow-up of diabetes. His last hemoglobin A1c was 7.8.  At the time of his last visit, he was unable to afford Levemir and Humalog and he was switched to a regimen of 70/30 insulin via vials.  He is initially started on 40 units twice a day and he has increased 70/30 insulin to 45 twice daily.  His insurance plan.  Apparently has changed and he now is able to afford Levemir, which she also continues to take at a dose of 80 mg once daily  Wt Readings from Last 3 Encounters:  05/25/16 291 lb 3.2 oz (132.1 kg)  03/16/16 281 lb (127.5 kg)  01/13/16 289 lb 3.2 oz (131.2 kg)   He continues to gain weight and freely admits to poor dietary habits.  He states that he is now followed at the [facility] system and may be able to get his medications at low or no cost.  Hemoglobin A1c today 8.2  Past Medical History:  Diagnosis Date  . Adenomatous polyp   . Cataract    left eye for sure   . Chronic pain   . Diabetes mellitus   . GERD (gastroesophageal reflux disease)   . History of colonic polyps   . History of hip replacement, total   . Hx of anaphylactic shock   . Hyperlipidemia   . Hypertension   . Left anterior hemiblock   . Obesity   . RBBB (right bundle branch block with left anterior fascicular block)   . 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 Marital status: Married    Spouse name: N/A  . Number of children: N/A  . Years of education: N/A   Occupational History  . Not on file.   Social History Main Topics  . Smoking status: Former Smoker    Packs/day: 1.50    Years: 42.00    Types: Cigarettes    Quit date: 09/03/2013  . Smokeless tobacco: Former Systems developer    Types: Chew     Comment: We discussed the need to remain smoke free and to call me for help if he has the desire to smoke  again.  . Alcohol use No     Comment: recovering alcoholic   . Drug use: No  . Sexual activity: Not on file   Other Topics Concern  . Not on file   Social History Narrative   Recovering alcoholic      Minister     Past Surgical History:  Procedure Laterality Date  . APPENDECTOMY    . CERVICAL FUSION    . CERVICAL LAMINECTOMY    . COLONOSCOPY    . HAND SURGERY    . HARDWARE REMOVAL Left 09/10/2012   Procedure: HARDWARE REMOVAL;  Surgeon: Jessy Oto, MD;  Location: [facility];  Service: Orthopedics;  Laterality: Left;  . HIP SURGERY  12/29/09   R THR  ([place])  . HIP SURGERY     bilateral  . TOTAL HIP ARTHROPLASTY Left 09/10/2012   Procedure: REMOVAL OF HARDWARE LEFT PROXIMAL THIGH/FEMUR AND LEFT TOTAL HIP ARTHROPLASTY;  Surgeon: Jessy Oto, MD;  Location: [facility];  Service: Orthopedics;  Laterality: Left;  . TOTAL KNEE ARTHROPLASTY  01/2008   left knee  Family History  Problem Relation Age of Onset  . Cancer Mother        lung  . Crohn's disease Father   . Alcohol abuse Brother   . Colon polyps Brother   . Colon cancer Neg Hx     Allergies  Allergen Reactions  . Codeine     REACTION: itch  . Morphine And Related Nausea And Vomiting  . Naproxen     REACTION: anaphylaxis    Current Outpatient Prescriptions on File Prior to Visit  Medication Sig Dispense Refill  . atorvastatin (LIPITOR) 40 MG tablet Take 1 tablet (40 mg total) by mouth daily. 30 tablet 1  . Blood Glucose Monitoring Suppl (ONE TOUCH ULTRA SYSTEM KIT) W/DEVICE KIT 1 kit by Does not apply route once. 1 each 0  . diclofenac sodium (VOLTAREN) 1 % GEL Apply topically.      . fexofenadine (ALLEGRA) 180 MG tablet Take 1 tablet (180 mg total) by mouth daily. 90 tablet 3  . HYDROcodone-acetaminophen (NORCO) 7.5-325 MG tablet Take 1-2 tablets by mouth every 4 (four) hours as needed. 60 tablet 0  . Insulin Pen Needle (NOVOTWIST) 32G X 5 MM MISC USE TO INJECT INSULIN THREE TIMES A DAY 100 each 4  . lidocaine  (LIDODERM) 5 % Place 1 patch onto the skin as needed.     Marland Kitchen lisinopril-hydrochlorothiazide (PRINZIDE,ZESTORETIC) 20-25 MG tablet Take 1 tablet by mouth daily. 30 tablet 1  . metFORMIN (GLUCOPHAGE) 1000 MG tablet Take 1 tablet (1,000 mg total) by mouth 2 (two) times daily with a meal. 60 tablet 1  . Multiple Vitamins-Minerals (MULTIVITAMIN WITH MINERALS) tablet Take 1 tablet by mouth daily.    Marland Kitchen Nystatin (NYAMYC) 100000 UNIT/GM POWD Apply 1 Bottle topically as needed.     Marland Kitchen omeprazole (PRILOSEC) 20 MG capsule TAKE 1 CAPSULE BY MOUTH  DAILY 90 capsule 3  . ONE TOUCH ULTRA TEST test strip Check blood sugar daily and as needed 200 each 4   No current facility-administered medications on file prior to visit.     BP 132/72 (BP Location: Left Arm, Patient Position: Sitting, Cuff Size: Large)   Pulse 90   Temp 98.6 F (37 C) (Oral)   Wt 291 lb 3.2 oz (132.1 kg)   SpO2 98%   BMI 40.61 kg/m       Review of Systems  Constitutional: Positive for unexpected weight change. Negative for appetite change, chills, fatigue and fever.  HENT: Negative for congestion, dental problem, ear pain, hearing loss, sore throat, tinnitus, trouble swallowing and voice change.   Eyes: Negative for pain, discharge and visual disturbance.  Respiratory: Negative for cough, chest tightness, wheezing and stridor.   Cardiovascular: Negative for chest pain, palpitations and leg swelling.  Gastrointestinal: Negative for abdominal distention, abdominal pain, blood in stool, constipation, diarrhea, nausea and vomiting.  Genitourinary: Negative for difficulty urinating, discharge, flank pain, genital sores, hematuria and urgency.  Musculoskeletal: Positive for back pain. Negative for arthralgias, gait problem, joint swelling, myalgias and neck stiffness.  Skin: Negative for rash.  Neurological: Negative for dizziness, syncope, speech difficulty, weakness, numbness and headaches.  Hematological: Negative for adenopathy. Does  not bruise/bleed easily.  Psychiatric/Behavioral: Negative for behavioral problems and dysphoric mood. The patient is not nervous/anxious.        Objective:   Physical Exam  Constitutional: He is oriented to person, place, and time. He appears well-developed.  Weight 291 Blood pressure 130/70 O2 saturation 98%  HENT:  Head: Normocephalic.  Right Ear:  External ear normal.  Left Ear: External ear normal.  Eyes: Conjunctivae and EOM are normal.  Neck: Normal range of motion.  Cardiovascular: Normal rate and normal heart sounds.   Pulmonary/Chest: Breath sounds normal.  Abdominal: Bowel sounds are normal.  Musculoskeletal: Normal range of motion. He exhibits no edema or tenderness.  Neurological: He is alert and oriented to person, place, and time.  Psychiatric: He has a normal mood and affect. His behavior is normal.          Assessment & Plan:   Diabetes mellitus.  Suboptimal control.  The relationship of obesity with insulin resistance.  Discussed at length.  He states that he is motivated to lose weight.  Will discontinue 70/30 insulin and increase Levemir to 100 units at bedtime.  Will resume mealtime insulin at a higher dose Morbid obesity Chronic low back pain.  Medications updated Dyslipidemia.  Continue statin therapy Essential hypertension, controlled  Follow-up 3 months.  Patient is aware he might need dietary referral, or endocrine referral.  If unimproved at next visit  Nyoka Cowden

## 2016-06-15 ENCOUNTER — Ambulatory Visit (INDEPENDENT_AMBULATORY_CARE_PROVIDER_SITE_OTHER): Payer: Medicare Other | Admitting: Specialist

## 2016-08-24 ENCOUNTER — Ambulatory Visit: Payer: 59 | Admitting: Internal Medicine

## 2016-09-01 DIAGNOSIS — L570 Actinic keratosis: Secondary | ICD-10-CM | POA: Diagnosis not present

## 2016-09-01 DIAGNOSIS — Z85828 Personal history of other malignant neoplasm of skin: Secondary | ICD-10-CM | POA: Diagnosis not present

## 2016-09-01 DIAGNOSIS — L814 Other melanin hyperpigmentation: Secondary | ICD-10-CM | POA: Diagnosis not present

## 2016-09-01 DIAGNOSIS — D1801 Hemangioma of skin and subcutaneous tissue: Secondary | ICD-10-CM | POA: Diagnosis not present

## 2016-09-01 DIAGNOSIS — L821 Other seborrheic keratosis: Secondary | ICD-10-CM | POA: Diagnosis not present

## 2016-10-17 ENCOUNTER — Ambulatory Visit (INDEPENDENT_AMBULATORY_CARE_PROVIDER_SITE_OTHER)
Admission: RE | Admit: 2016-10-17 | Discharge: 2016-10-17 | Disposition: A | Discharge status: Home / Self Care | Payer: 59 | Source: Ambulatory Visit | Attending: Acute Care | Admitting: Acute Care

## 2016-10-17 DIAGNOSIS — Z87891 Personal history of nicotine dependence: Secondary | ICD-10-CM

## 2016-10-19 ENCOUNTER — Other Ambulatory Visit: Payer: Self-pay | Admitting: Acute Care

## 2016-10-19 DIAGNOSIS — Z87891 Personal history of nicotine dependence: Secondary | ICD-10-CM

## 2016-10-19 DIAGNOSIS — Z122 Encounter for screening for malignant neoplasm of respiratory organs: Secondary | ICD-10-CM

## 2016-11-10 ENCOUNTER — Ambulatory Visit (INDEPENDENT_AMBULATORY_CARE_PROVIDER_SITE_OTHER): Payer: 59

## 2016-11-10 DIAGNOSIS — Z23 Encounter for immunization: Secondary | ICD-10-CM | POA: Diagnosis not present

## 2016-12-15 ENCOUNTER — Encounter (INDEPENDENT_AMBULATORY_CARE_PROVIDER_SITE_OTHER): Payer: Self-pay | Admitting: Specialist

## 2016-12-15 ENCOUNTER — Ambulatory Visit (INDEPENDENT_AMBULATORY_CARE_PROVIDER_SITE_OTHER): Payer: Medicare Other | Admitting: Specialist

## 2016-12-15 VITALS — BP 134/71 | HR 93 | Ht 71.0 in | Wt 291.0 lb

## 2016-12-15 DIAGNOSIS — M12812 Other specific arthropathies, not elsewhere classified, left shoulder: Secondary | ICD-10-CM

## 2016-12-15 DIAGNOSIS — M1711 Unilateral primary osteoarthritis, right knee: Secondary | ICD-10-CM | POA: Diagnosis not present

## 2016-12-15 DIAGNOSIS — M12811 Other specific arthropathies, not elsewhere classified, right shoulder: Secondary | ICD-10-CM

## 2016-12-15 DIAGNOSIS — M48062 Spinal stenosis, lumbar region with neurogenic claudication: Secondary | ICD-10-CM

## 2016-12-15 MED ORDER — AMOXICILLIN-POT CLAVULANATE 875-125 MG PO TABS: 1.0000 | ORAL_TABLET | Freq: Two times a day (BID) | ORAL | 1 refills | Status: DC

## 2016-12-15 NOTE — Progress Notes (Signed)
Office Visit Note   Patient: [name]           Date of Birth: [birth year]           MRN: [id number] Visit Date: 12/15/2016              Requested by: Marletta Lor, MD [place], [place] [id number] PCP: Marletta Lor, MD   Assessment & Plan: Visit Diagnoses:  1. Spinal stenosis of lumbar region with neurogenic claudication   2. Unilateral primary osteoarthritis, right knee   3. Rotator cuff arthropathy of both shoulders     Plan:  Avoid bending, stooping and avoid lifting weights greater than 10 lbs. Avoid prolong standing and walking. Avoid frequent bending and stooping  No lifting greater than 10 lbs. May use ice or moist heat for pain. Weight loss is of benefit. Handicap license is approved. The main ways of treat osteoarthritis, that are found to be success. Weight loss helps to decrease pain. Exercise is important to maintaining cartilage and thickness and strengthening. NSAIDs like motrin, tylenol, alleve are meds decreasing the inflamation. Ice is okay  In afternoon and evening and hot shower in the am Avoid overhead lifting and overhead use of the arms. Do not lift greater than 10 lbs.  Follow-Up Instructions: Return in about 4 weeks (around 01/12/2017).   Orders:  No orders of the defined types were placed in this encounter.  No orders of the defined types were placed in this encounter.     Procedures: No procedures performed   Clinical Data: No additional findings.   Subjective: Chief Complaint  Patient presents with  . Lower Back - Follow-up    Discuss paperwork for [facility] so he doesn't lose his benefits    61 year old male with history of lumbar spinal stenosis, previous bilateral hip avascular stenosis. History of bilateral free vascularized fibula graft of the hips and then revision of the hips to total hip replacements. Then underwent left total knee replacement for severe osteoarthritis of the left knee and  proximal Tibia-fibula syndesmosis changes. The right knee has degenerative changes but he is not impaired with the right knee to point where TKR is necessary.     Review of Systems  Constitutional: Negative.   HENT: Negative.   Eyes: Negative.   Respiratory: Negative.   Cardiovascular: Negative.   Gastrointestinal: Negative.   Endocrine: Negative.   Genitourinary: Negative.   Musculoskeletal: Negative.   Skin: Negative.   Allergic/Immunologic: Negative.   Neurological: Negative.   Hematological: Negative.   Psychiatric/Behavioral: Negative.      Objective: Vital Signs: BP 134/71 (BP Location: Left Arm, Patient Position: Sitting)   Pulse 93   Ht 5\' 11"  (1.803 m)   Wt 291 lb (132 kg)   BMI 40.59 kg/m   Physical Exam  Constitutional: He is oriented to person, place, and time. He appears well-developed and well-nourished.  HENT:  Head: Normocephalic and atraumatic.  Eyes: EOM are normal. Pupils are equal, round, and reactive to light.  Neck: Normal range of motion. Neck supple.  Pulmonary/Chest: Effort normal and breath sounds normal.  Abdominal: Soft. Bowel sounds are normal.  Neurological: He is alert and oriented to person, place, and time.  Skin: Skin is warm and dry.  Psychiatric: He has a normal mood and affect. His behavior is normal. Judgment and thought content normal.    Right Knee Exam  Right knee exam is normal.  Range of Motion  Extension:  normal  Flexion: 130   Tests  McMurray:  Medial - negative Lateral - negative Varus: negative Valgus: negative Lachman:  Anterior - negative    Posterior - negative Drawer:  Anterior - negative    Posterior - negative Pivot shift: negative Patellar apprehension: negative  Other  Erythema: absent Scars: absent Sensation: normal Pulse: present Swelling: none   Left Knee Exam   Muscle Strength  The patient has normal left knee strength.  Range of Motion  Extension:  0 normal  Flexion: 130   Tests    McMurray:  Medial - negative Lateral - negative Varus: positive  Lachman:  Anterior - 2+    Posterior - trace Pivot shift: negative Patellar apprehension: negative  Other  Erythema: absent Scars: present Sensation: decreased Pulse: present Swelling: mild   Back Exam   Tenderness  The patient is experiencing tenderness in the lumbar.  Range of Motion  Extension: abnormal  Flexion: abnormal  Lateral bend right: abnormal  Lateral bend left: abnormal  Rotation right: abnormal  Rotation left: abnormal   Muscle Strength  Right Quadriceps:  5/5  Left Quadriceps:  5/5  Right Hamstrings:  5/5  Left Hamstrings:  5/5   Tests  Straight leg raise right: negative Straight leg raise left: negative  Reflexes  Patellar:  Hyporeflexic abnormal Achilles:  Hyporeflexic abnormal Babinski's sign: normal   Other  Toe walk: abnormal Heel walk: abnormal Sensation: normal      Specialty Comments:  No specialty comments available.  Imaging: No results found.   PMFS History: Patient Active Problem List   Diagnosis Date Noted  . Osteoarthritis of left hip 09/13/2012    Priority: High    Class: Chronic  . Periprosthetic fracture around internal prosthetic left hip joint ([place]) 09/13/2012    Priority: High    Class: Acute  . Postoperative anemia due to acute blood loss 09/12/2012    Priority: Medium    Class: Acute  . Bilateral inguinal hernia 11/08/2012  . Exertional dyspnea 06/12/2012  . Obesity 02/28/2012  . Chronic back pain 02/28/2012  . Osteoarthritis 10/15/2007  . GASTROENTERITIS 08/28/2007  . GERD 07/13/2007  . CELLULITIS, RIGHT LEG 06/26/2007  . SINUSITIS 09/12/2006  . SEBACEOUS CYST, INFECTED 08/24/2006  . Type 2 diabetes mellitus without complications ([place]) 89/78/4784  . Dyslipidemia 08/02/2006  . Essential hypertension 08/02/2006  . History of colonic polyps 08/02/2006   Past Medical History:  Diagnosis Date  . Adenomatous polyp   . Cataract     left eye for sure   . Chronic pain   . Diabetes mellitus   . GERD (gastroesophageal reflux disease)   . History of colonic polyps   . History of hip replacement, total   . Hx of anaphylactic shock   . Hyperlipidemia   . Hypertension   . Left anterior hemiblock   . Obesity   . RBBB (right bundle branch block with left anterior fascicular block)   . 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 Cancer Mother        lung  . Crohn's disease Father   . Alcohol abuse Brother   . Colon polyps Brother   . Colon cancer Neg Hx     Past Surgical History:  Procedure Laterality Date  . APPENDECTOMY    . CERVICAL FUSION    . CERVICAL LAMINECTOMY    . COLONOSCOPY    . HAND SURGERY    .  HARDWARE REMOVAL Left 09/10/2012   Procedure: HARDWARE REMOVAL;  Surgeon: Jessy Oto, MD;  Location: [facility];  Service: Orthopedics;  Laterality: Left;  . HIP SURGERY  12/29/09   R THR  (Juanette Urizar)  . HIP SURGERY     bilateral  . TOTAL HIP ARTHROPLASTY Left 09/10/2012   Procedure: REMOVAL OF HARDWARE LEFT PROXIMAL THIGH/FEMUR AND LEFT TOTAL HIP ARTHROPLASTY;  Surgeon: Jessy Oto, MD;  Location: [facility];  Service: Orthopedics;  Laterality: Left;  . TOTAL KNEE ARTHROPLASTY  01/2008   left knee   Social History   Occupational History  . Not on file  Tobacco Use  . Smoking status: Former Smoker    Packs/day: 1.50    Years: 42.00    Pack years: 63.00    Types: Cigarettes    Last attempt to quit: 09/03/2013    Years since quitting: 3.2  . Smokeless tobacco: Former Systems developer    Types: Chew  . Tobacco comment: We discussed the need to remain smoke free and to call me for help if he has the desire to smoke again.  Substance and Sexual Activity  . Alcohol use: No    Alcohol/week: 0.0 oz    Comment: recovering alcoholic   . Drug use: No  . Sexual activity: Not on file

## 2016-12-15 NOTE — Patient Instructions (Addendum)
  Avoid bending, stooping and avoid lifting weights greater than 10 lbs. Avoid prolong standing and walking. Avoid frequent bending and stooping  No lifting greater than 10 lbs. May use ice or moist heat for pain. Weight loss is of benefit. Handicap license is approved. The main ways of treat osteoarthritis, that are found to be success. Weight loss helps to decrease pain. Exercise is important to maintaining cartilage and thickness and strengthening. NSAIDs like motrin, tylenol, alleve are meds decreasing the inflamation. Ice is okay  In afternoon and evening and hot shower in the am Avoid overhead lifting and overhead use of the arms. Do not lift greater than 10 lbs.

## 2016-12-15 NOTE — Addendum Note (Signed)
Addended by: Basil Dess on: 12/15/2016 03:50 PM   Modules accepted: Orders

## 2016-12-23 ENCOUNTER — Ambulatory Visit (INDEPENDENT_AMBULATORY_CARE_PROVIDER_SITE_OTHER): Payer: Medicare Other | Admitting: Specialist

## 2017-01-12 ENCOUNTER — Telehealth (INDEPENDENT_AMBULATORY_CARE_PROVIDER_SITE_OTHER): Payer: Self-pay | Admitting: Specialist

## 2017-01-12 NOTE — Telephone Encounter (Signed)
Patient walked in wondering about the [place] forms he brought in tto be completed. He said he brought them 12/13 and will need them pretty soon. He would like a call once they are complete # [phone]

## 2017-01-12 NOTE — Telephone Encounter (Signed)
Patient walked in wondering about the [place] forms he brought in tto be completed. He said he brought them 12/13 and will need them pretty soon. He would like a call once they are complete # [phone]

## 2017-07-12 ENCOUNTER — Other Ambulatory Visit (INDEPENDENT_AMBULATORY_CARE_PROVIDER_SITE_OTHER): Payer: Self-pay | Admitting: Specialist

## 2017-07-13 NOTE — Telephone Encounter (Signed)
amoxicillin-clavulanate refill request

## 2017-10-30 ENCOUNTER — Encounter: Payer: Self-pay | Admitting: *Deleted

## 2017-11-20 ENCOUNTER — Other Ambulatory Visit: Payer: Self-pay | Admitting: Family Medicine

## 2017-11-20 DIAGNOSIS — E298 Other testicular dysfunction: Secondary | ICD-10-CM

## 2017-11-29 ENCOUNTER — Other Ambulatory Visit: Payer: Medicare Other

## 2017-12-08 ENCOUNTER — Ambulatory Visit
Admission: RE | Admit: 2017-12-08 | Discharge: 2017-12-08 | Disposition: A | Discharge status: Home / Self Care | Payer: Medicare Other | Source: Ambulatory Visit | Attending: Family Medicine | Admitting: Family Medicine

## 2017-12-08 DIAGNOSIS — E298 Other testicular dysfunction: Secondary | ICD-10-CM

## 2018-01-15 IMAGING — CT CT CHEST LUNG CANCER SCREENING LOW DOSE W/O CM
2 of 4 series · 14 of 40 positions shown, 17 images · non-contrast
Comparison: Low-dose lung cancer screening chest CT 10/14/2014.

CLINICAL DATA: 60-year-old male former smoker (quit in 5532) with
63 pack-year history of smoking. Lung cancer screening examination.

EXAM:
CT CHEST WITHOUT CONTRAST LOW-DOSE FOR LUNG CANCER SCREENING
TECHNIQUE: Multidetector CT imaging of the chest was performed following the
standard protocol without IV contrast.

[Series 2: thorax 5.0 i31f 3 · axial · 0.88mm/px · z∈[-345,-65]mm · 11 of 68 slices shown, 14 images]
[im 6/68  mediastinal]
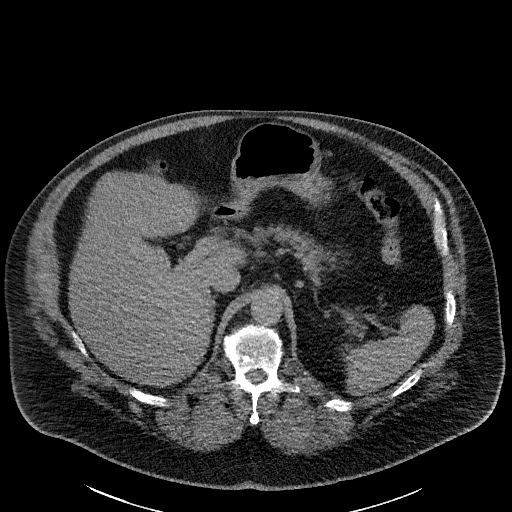
[im 6/68  lung]
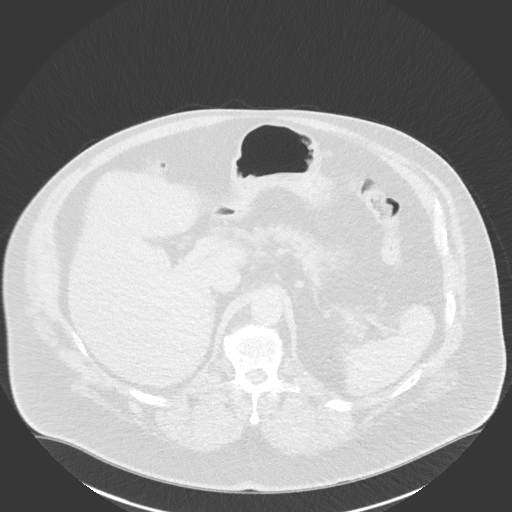
[im 11/68  lung]
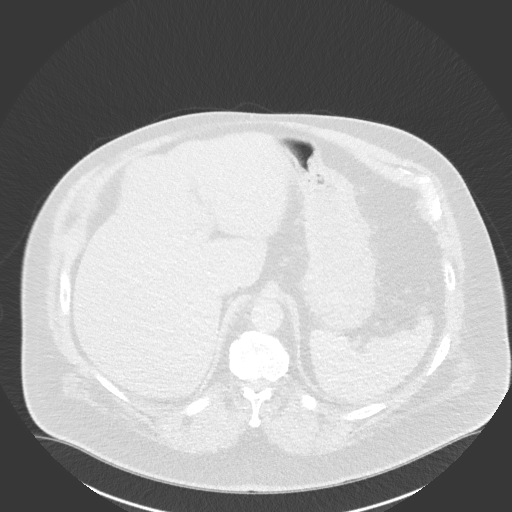
[im 16/68  lung]
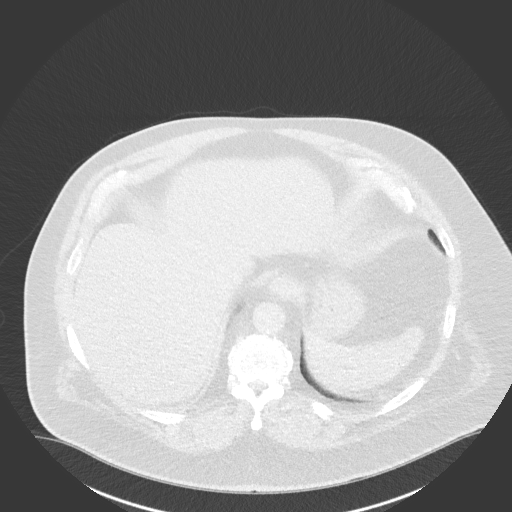
[im 21/68  lung]
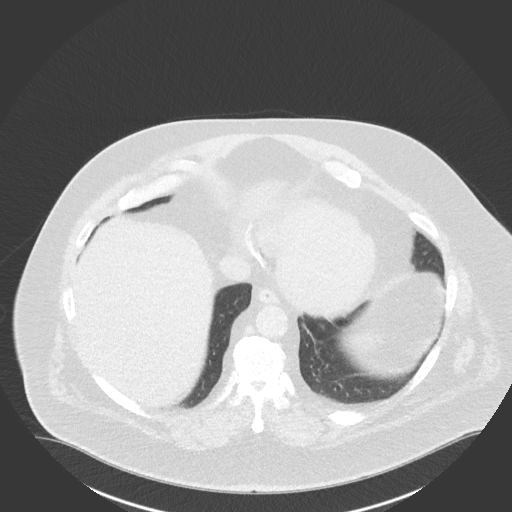
[im 29/68  mediastinal]
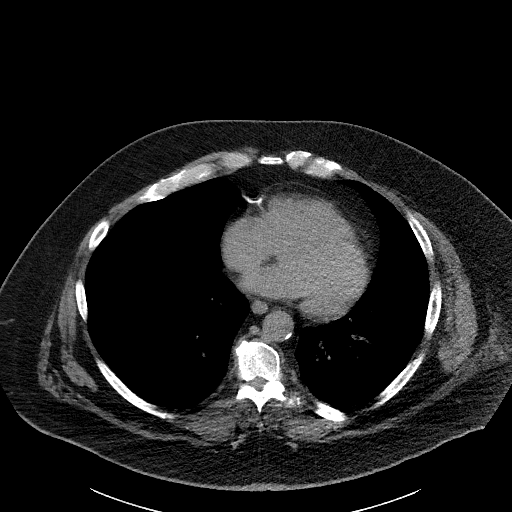
[im 29/68  lung]
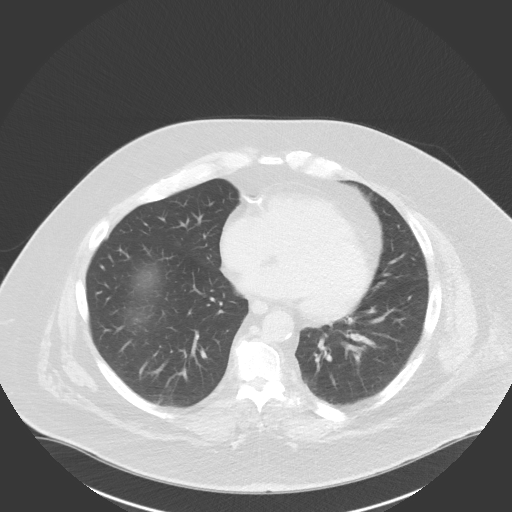
[im 34/68  lung]
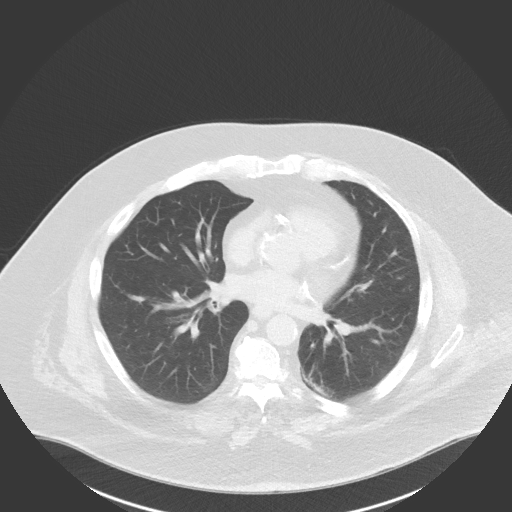
[im 39/68  lung]
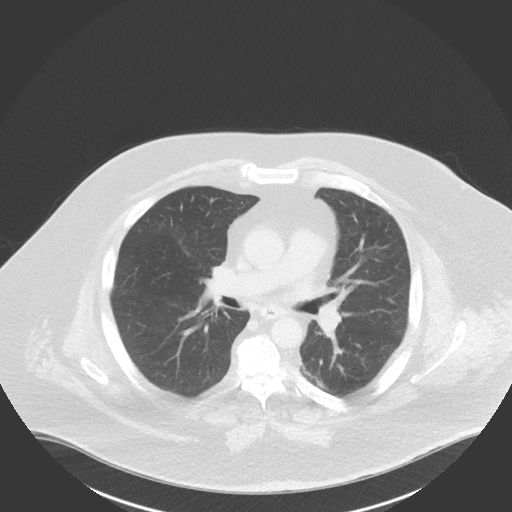
[im 47/68  lung]
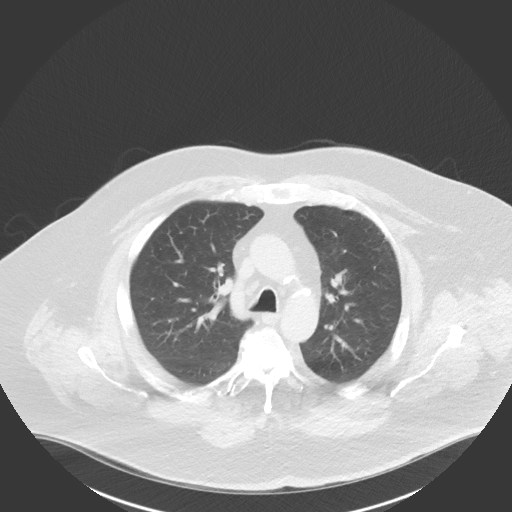
[im 52/68  mediastinal]
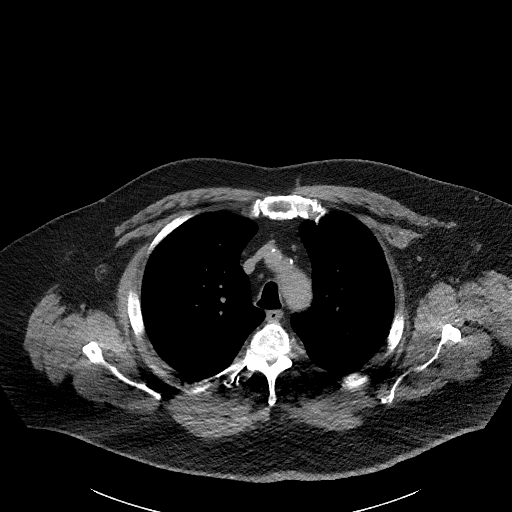
[im 52/68  lung]
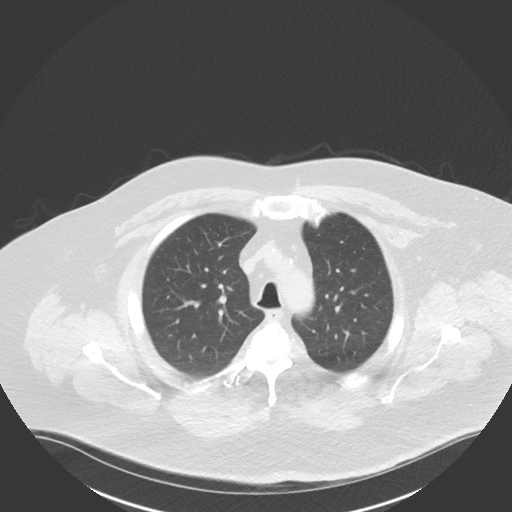
[im 57/68  lung]
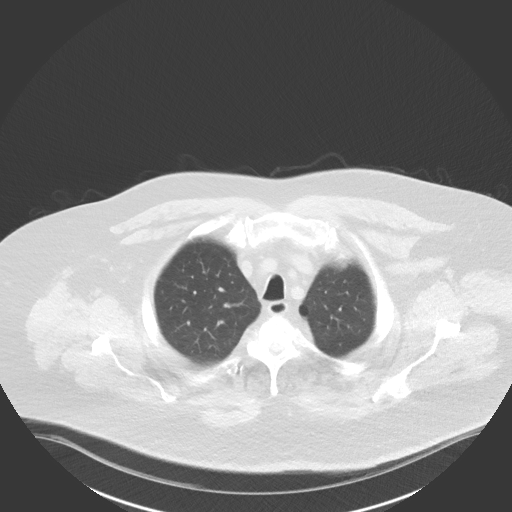
[im 62/68  lung]
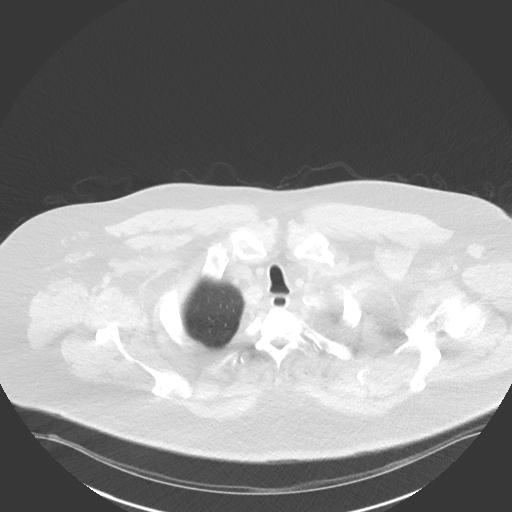

[Series 5: coronal · coronal · 0.66mm/px · 3 of 143 slices shown]
[im 29/143  lung]
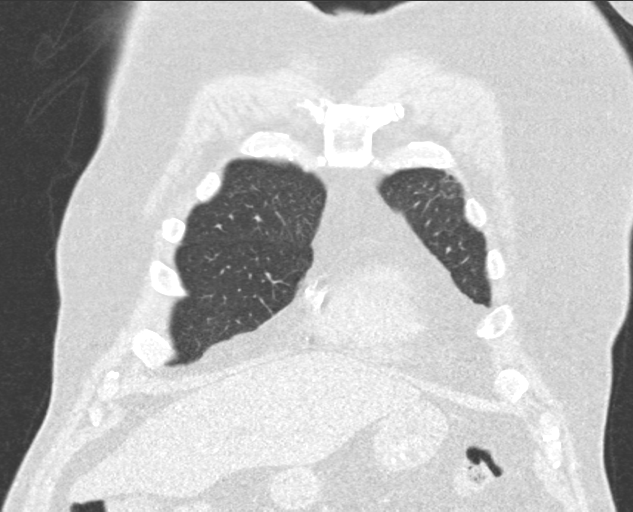
[im 57/143  lung]
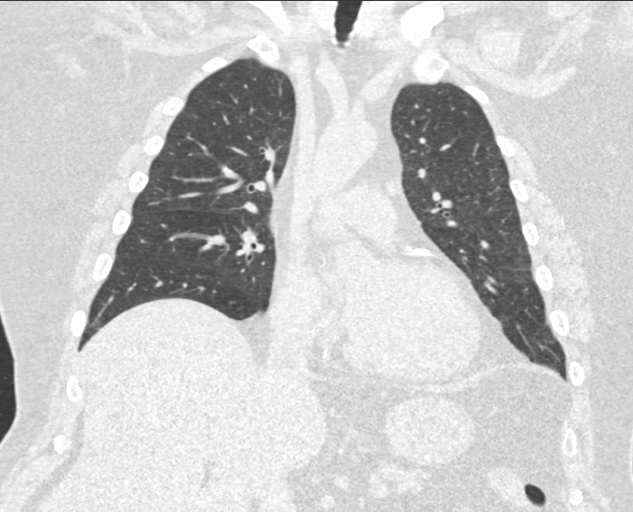
[im 86/143  lung]
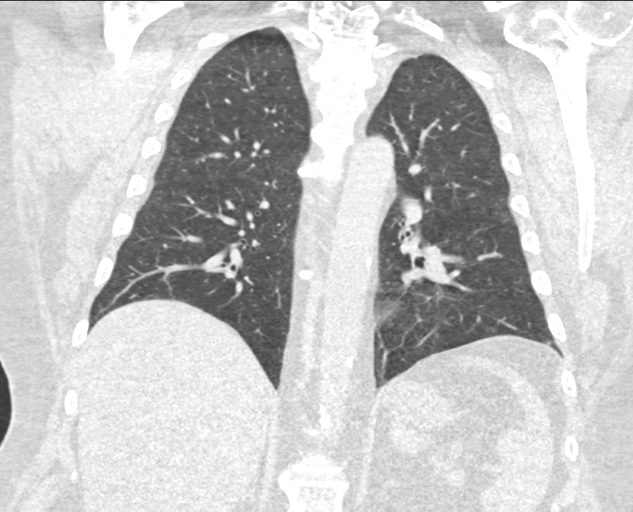

[14 of 40 positions shown; findings below may reference images not displayed]

FINDINGS: Cardiovascular: Heart size is normal. There is no significant
pericardial fluid, thickening or pericardial calcification. There is
aortic atherosclerosis, as well as atherosclerosis of the great
vessels of the mediastinum and the coronary arteries, including
calcified atherosclerotic plaque in the left main, left anterior
descending, left circumflex and right coronary arteries.

Mediastinum/Nodes: No pathologically enlarged mediastinal or hilar
lymph nodes. Please note that accurate exclusion of hilar adenopathy
is limited on noncontrast CT scans. Esophagus is unremarkable in
appearance. No axillary lymphadenopathy.

Lungs/Pleura: New ground-glass attenuation nodule with a volume
derived mean diameter of 9 mm in the superior segment of the left
lower lobe (image 120 of series 3). No other larger more suspicious
appearing pulmonary nodules or masses are otherwise noted. No acute
consolidative airspace disease. No pleural effusions. Areas of
dependent atelectasis and/or scarring are noted in the left lower
lobe. Mild diffuse bronchial wall thickening with very mild
centrilobular and paraseptal emphysema.

Upper Abdomen: Diffuse low attenuation throughout the visualized
hepatic parenchyma, compatible with hepatic steatosis. Aortic
atherosclerosis.

Musculoskeletal: There are no aggressive appearing lytic or blastic
lesions noted in the visualized portions of the skeleton.
IMPRESSION: 1. Lung-RADS Category 2S, benign appearance or behavior. Continue
annual screening with low-dose chest CT without contrast in 12
months.
2. The "S" modifier above refers to potentially clinically
significant non lung cancer related findings. Specifically, there is
aortic atherosclerosis, in addition to left main and 3 vessel
coronary artery disease. Please note that although the presence of
coronary artery calcium documents the presence of coronary artery
disease, the severity of this disease and any potential stenosis
cannot be assessed on this non-gated CT examination. Assessment for
potential risk factor modification, dietary therapy or pharmacologic
therapy may be warranted, if clinically indicated.
3. Mild diffuse bronchial wall thickening with mild centrilobular
and paraseptal emphysema; imaging findings suggestive of underlying
COPD.
4. Hepatic steatosis.

## 2018-07-12 ENCOUNTER — Telehealth: Payer: Self-pay | Admitting: Specialist

## 2018-07-12 NOTE — Telephone Encounter (Signed)
Patient called stated left some [facility] papers w/Nitka to fill out several months ago and has not heard anything from him. They were left with him  Please call patient to advise of papers.

## 2018-07-12 NOTE — Telephone Encounter (Signed)
Patient called stated left some [facility] papers w/Nitka to fill out several months ago and has not heard anything from him. They were left with him  Please call patient to advise of papers.  [phone]

## 2020-05-19 ENCOUNTER — Encounter (HOSPITAL_COMMUNITY)
Admission: EM | Disposition: A | Discharge status: Rehabilitation Facility | Payer: Self-pay | Source: Other Acute Inpatient Hospital | Attending: Family Medicine

## 2020-05-19 ENCOUNTER — Ambulatory Visit (HOSPITAL_COMMUNITY): Admit: 2020-05-19 | Payer: Non-veteran care | Admitting: Cardiology

## 2020-05-19 ENCOUNTER — Encounter: Payer: Self-pay | Admitting: Critical Care Medicine

## 2020-05-19 ENCOUNTER — Emergency Department (HOSPITAL_COMMUNITY): Payer: No Typology Code available for payment source

## 2020-05-19 ENCOUNTER — Inpatient Hospital Stay (HOSPITAL_COMMUNITY): Payer: No Typology Code available for payment source

## 2020-05-19 ENCOUNTER — Inpatient Hospital Stay (HOSPITAL_COMMUNITY)
Admission: EM | Disposition: A | Discharge status: Rehabilitation Facility | Payer: Self-pay | Source: Other Acute Inpatient Hospital | Attending: Family Medicine

## 2020-05-19 ENCOUNTER — Inpatient Hospital Stay (HOSPITAL_COMMUNITY)
Admission: EM | Admit: 2020-05-19 | Discharge: 2020-06-02 | DRG: 222 | Disposition: A | Discharge status: Rehabilitation Facility | Payer: No Typology Code available for payment source | Source: Other Acute Inpatient Hospital | Attending: Family Medicine | Admitting: Family Medicine

## 2020-05-19 DIAGNOSIS — I5023 Acute on chronic systolic (congestive) heart failure: Secondary | ICD-10-CM | POA: Diagnosis not present

## 2020-05-19 DIAGNOSIS — J690 Pneumonitis due to inhalation of food and vomit: Secondary | ICD-10-CM | POA: Diagnosis not present

## 2020-05-19 DIAGNOSIS — I313 Pericardial effusion (noninflammatory): Secondary | ICD-10-CM | POA: Diagnosis present

## 2020-05-19 DIAGNOSIS — Z885 Allergy status to narcotic agent status: Secondary | ICD-10-CM

## 2020-05-19 DIAGNOSIS — I255 Ischemic cardiomyopathy: Secondary | ICD-10-CM | POA: Diagnosis present

## 2020-05-19 DIAGNOSIS — I453 Trifascicular block: Secondary | ICD-10-CM | POA: Diagnosis present

## 2020-05-19 DIAGNOSIS — Z96642 Presence of left artificial hip joint: Secondary | ICD-10-CM | POA: Diagnosis present

## 2020-05-19 DIAGNOSIS — E876 Hypokalemia: Secondary | ICD-10-CM | POA: Diagnosis not present

## 2020-05-19 DIAGNOSIS — Z800 Family history of malignant neoplasm of digestive organs: Secondary | ICD-10-CM

## 2020-05-19 DIAGNOSIS — Z95810 Presence of automatic (implantable) cardiac defibrillator: Secondary | ICD-10-CM

## 2020-05-19 DIAGNOSIS — Z86010 Personal history of colonic polyps: Secondary | ICD-10-CM

## 2020-05-19 DIAGNOSIS — I509 Heart failure, unspecified: Secondary | ICD-10-CM | POA: Diagnosis not present

## 2020-05-19 DIAGNOSIS — Z6841 Body Mass Index (BMI) 40.0 and over, adult: Secondary | ICD-10-CM | POA: Diagnosis not present

## 2020-05-19 DIAGNOSIS — J811 Chronic pulmonary edema: Secondary | ICD-10-CM | POA: Diagnosis not present

## 2020-05-19 DIAGNOSIS — E46 Unspecified protein-calorie malnutrition: Secondary | ICD-10-CM | POA: Diagnosis not present

## 2020-05-19 DIAGNOSIS — Z20822 Contact with and (suspected) exposure to covid-19: Secondary | ICD-10-CM | POA: Diagnosis present

## 2020-05-19 DIAGNOSIS — I441 Atrioventricular block, second degree: Secondary | ICD-10-CM | POA: Diagnosis not present

## 2020-05-19 DIAGNOSIS — G4733 Obstructive sleep apnea (adult) (pediatric): Secondary | ICD-10-CM | POA: Diagnosis present

## 2020-05-19 DIAGNOSIS — R4020 Unspecified coma: Secondary | ICD-10-CM | POA: Diagnosis not present

## 2020-05-19 DIAGNOSIS — K219 Gastro-esophageal reflux disease without esophagitis: Secondary | ICD-10-CM | POA: Diagnosis present

## 2020-05-19 DIAGNOSIS — Z4682 Encounter for fitting and adjustment of non-vascular catheter: Secondary | ICD-10-CM | POA: Diagnosis not present

## 2020-05-19 DIAGNOSIS — Z4659 Encounter for fitting and adjustment of other gastrointestinal appliance and device: Secondary | ICD-10-CM

## 2020-05-19 DIAGNOSIS — E1169 Type 2 diabetes mellitus with other specified complication: Secondary | ICD-10-CM | POA: Diagnosis not present

## 2020-05-19 DIAGNOSIS — I442 Atrioventricular block, complete: Secondary | ICD-10-CM | POA: Diagnosis not present

## 2020-05-19 DIAGNOSIS — Z66 Do not resuscitate: Secondary | ICD-10-CM | POA: Diagnosis not present

## 2020-05-19 DIAGNOSIS — R0902 Hypoxemia: Secondary | ICD-10-CM | POA: Diagnosis not present

## 2020-05-19 DIAGNOSIS — J80 Acute respiratory distress syndrome: Secondary | ICD-10-CM | POA: Diagnosis present

## 2020-05-19 DIAGNOSIS — G894 Chronic pain syndrome: Secondary | ICD-10-CM | POA: Diagnosis not present

## 2020-05-19 DIAGNOSIS — J90 Pleural effusion, not elsewhere classified: Secondary | ICD-10-CM | POA: Diagnosis not present

## 2020-05-19 DIAGNOSIS — N179 Acute kidney failure, unspecified: Secondary | ICD-10-CM | POA: Diagnosis not present

## 2020-05-19 DIAGNOSIS — E861 Hypovolemia: Secondary | ICD-10-CM | POA: Diagnosis present

## 2020-05-19 DIAGNOSIS — Z886 Allergy status to analgesic agent status: Secondary | ICD-10-CM

## 2020-05-19 DIAGNOSIS — K7200 Acute and subacute hepatic failure without coma: Secondary | ICD-10-CM | POA: Diagnosis not present

## 2020-05-19 DIAGNOSIS — I213 ST elevation (STEMI) myocardial infarction of unspecified site: Secondary | ICD-10-CM | POA: Diagnosis not present

## 2020-05-19 DIAGNOSIS — I469 Cardiac arrest, cause unspecified: Secondary | ICD-10-CM

## 2020-05-19 DIAGNOSIS — Z96652 Presence of left artificial knee joint: Secondary | ICD-10-CM | POA: Diagnosis present

## 2020-05-19 DIAGNOSIS — I2111 ST elevation (STEMI) myocardial infarction involving right coronary artery: Secondary | ICD-10-CM

## 2020-05-19 DIAGNOSIS — G8929 Other chronic pain: Secondary | ICD-10-CM | POA: Diagnosis present

## 2020-05-19 DIAGNOSIS — E669 Obesity, unspecified: Secondary | ICD-10-CM | POA: Diagnosis not present

## 2020-05-19 DIAGNOSIS — Z981 Arthrodesis status: Secondary | ICD-10-CM

## 2020-05-19 DIAGNOSIS — D62 Acute posthemorrhagic anemia: Secondary | ICD-10-CM | POA: Diagnosis not present

## 2020-05-19 DIAGNOSIS — Z9911 Dependence on respirator [ventilator] status: Secondary | ICD-10-CM | POA: Diagnosis not present

## 2020-05-19 DIAGNOSIS — Z801 Family history of malignant neoplasm of trachea, bronchus and lung: Secondary | ICD-10-CM | POA: Diagnosis not present

## 2020-05-19 DIAGNOSIS — I5020 Unspecified systolic (congestive) heart failure: Secondary | ICD-10-CM

## 2020-05-19 DIAGNOSIS — I110 Hypertensive heart disease with heart failure: Secondary | ICD-10-CM | POA: Diagnosis present

## 2020-05-19 DIAGNOSIS — G4489 Other headache syndrome: Secondary | ICD-10-CM | POA: Diagnosis not present

## 2020-05-19 DIAGNOSIS — J9811 Atelectasis: Secondary | ICD-10-CM | POA: Diagnosis not present

## 2020-05-19 DIAGNOSIS — I4901 Ventricular fibrillation: Secondary | ICD-10-CM | POA: Diagnosis present

## 2020-05-19 DIAGNOSIS — J029 Acute pharyngitis, unspecified: Secondary | ICD-10-CM | POA: Diagnosis present

## 2020-05-19 DIAGNOSIS — R5381 Other malaise: Secondary | ICD-10-CM | POA: Diagnosis not present

## 2020-05-19 DIAGNOSIS — N170 Acute kidney failure with tubular necrosis: Secondary | ICD-10-CM | POA: Diagnosis present

## 2020-05-19 DIAGNOSIS — E872 Acidosis: Secondary | ICD-10-CM | POA: Diagnosis present

## 2020-05-19 DIAGNOSIS — Z87892 Personal history of anaphylaxis: Secondary | ICD-10-CM

## 2020-05-19 DIAGNOSIS — J984 Other disorders of lung: Secondary | ICD-10-CM | POA: Diagnosis not present

## 2020-05-19 DIAGNOSIS — E8809 Other disorders of plasma-protein metabolism, not elsewhere classified: Secondary | ICD-10-CM | POA: Diagnosis not present

## 2020-05-19 DIAGNOSIS — E119 Type 2 diabetes mellitus without complications: Secondary | ICD-10-CM

## 2020-05-19 DIAGNOSIS — I50810 Right heart failure, unspecified: Secondary | ICD-10-CM | POA: Diagnosis not present

## 2020-05-19 DIAGNOSIS — J986 Disorders of diaphragm: Secondary | ICD-10-CM | POA: Diagnosis not present

## 2020-05-19 DIAGNOSIS — Z452 Encounter for adjustment and management of vascular access device: Secondary | ICD-10-CM | POA: Diagnosis not present

## 2020-05-19 DIAGNOSIS — Z811 Family history of alcohol abuse and dependence: Secondary | ICD-10-CM

## 2020-05-19 DIAGNOSIS — Z9119 Patient's noncompliance with other medical treatment and regimen: Secondary | ICD-10-CM

## 2020-05-19 DIAGNOSIS — I2109 ST elevation (STEMI) myocardial infarction involving other coronary artery of anterior wall: Secondary | ICD-10-CM | POA: Diagnosis not present

## 2020-05-19 DIAGNOSIS — J189 Pneumonia, unspecified organism: Secondary | ICD-10-CM

## 2020-05-19 DIAGNOSIS — Z79899 Other long term (current) drug therapy: Secondary | ICD-10-CM

## 2020-05-19 DIAGNOSIS — I2119 ST elevation (STEMI) myocardial infarction involving other coronary artery of inferior wall: Secondary | ICD-10-CM | POA: Diagnosis not present

## 2020-05-19 DIAGNOSIS — Z794 Long term (current) use of insulin: Secondary | ICD-10-CM | POA: Diagnosis not present

## 2020-05-19 DIAGNOSIS — I517 Cardiomegaly: Secondary | ICD-10-CM | POA: Diagnosis not present

## 2020-05-19 DIAGNOSIS — D696 Thrombocytopenia, unspecified: Secondary | ICD-10-CM | POA: Diagnosis not present

## 2020-05-19 DIAGNOSIS — E785 Hyperlipidemia, unspecified: Secondary | ICD-10-CM | POA: Diagnosis present

## 2020-05-19 DIAGNOSIS — Z87891 Personal history of nicotine dependence: Secondary | ICD-10-CM

## 2020-05-19 DIAGNOSIS — I959 Hypotension, unspecified: Secondary | ICD-10-CM | POA: Diagnosis not present

## 2020-05-19 DIAGNOSIS — R579 Shock, unspecified: Secondary | ICD-10-CM | POA: Diagnosis not present

## 2020-05-19 DIAGNOSIS — E1165 Type 2 diabetes mellitus with hyperglycemia: Secondary | ICD-10-CM | POA: Diagnosis present

## 2020-05-19 DIAGNOSIS — I2510 Atherosclerotic heart disease of native coronary artery without angina pectoris: Secondary | ICD-10-CM | POA: Diagnosis present

## 2020-05-19 DIAGNOSIS — R404 Transient alteration of awareness: Secondary | ICD-10-CM | POA: Diagnosis not present

## 2020-05-19 DIAGNOSIS — R918 Other nonspecific abnormal finding of lung field: Secondary | ICD-10-CM | POA: Diagnosis not present

## 2020-05-19 DIAGNOSIS — R570 Cardiogenic shock: Secondary | ICD-10-CM | POA: Diagnosis not present

## 2020-05-19 DIAGNOSIS — R009 Unspecified abnormalities of heart beat: Secondary | ICD-10-CM | POA: Diagnosis not present

## 2020-05-19 DIAGNOSIS — Z7984 Long term (current) use of oral hypoglycemic drugs: Secondary | ICD-10-CM

## 2020-05-19 DIAGNOSIS — M549 Dorsalgia, unspecified: Secondary | ICD-10-CM | POA: Diagnosis not present

## 2020-05-19 DIAGNOSIS — R0602 Shortness of breath: Secondary | ICD-10-CM | POA: Diagnosis not present

## 2020-05-19 DIAGNOSIS — Z8371 Family history of colonic polyps: Secondary | ICD-10-CM

## 2020-05-19 DIAGNOSIS — J9690 Respiratory failure, unspecified, unspecified whether with hypoxia or hypercapnia: Secondary | ICD-10-CM

## 2020-05-19 DIAGNOSIS — J9601 Acute respiratory failure with hypoxia: Secondary | ICD-10-CM | POA: Diagnosis not present

## 2020-05-19 DIAGNOSIS — I472 Ventricular tachycardia: Secondary | ICD-10-CM | POA: Diagnosis not present

## 2020-05-19 DIAGNOSIS — I428 Other cardiomyopathies: Secondary | ICD-10-CM | POA: Diagnosis not present

## 2020-05-19 HISTORY — PX: LEFT HEART CATH AND CORONARY ANGIOGRAPHY: CATH118249

## 2020-05-19 HISTORY — PX: CORONARY/GRAFT ACUTE MI REVASCULARIZATION: CATH118305

## 2020-05-19 LAB — CBC WITH DIFFERENTIAL/PLATELET
Abs Immature Granulocytes: 0.96 10*3/uL — ABNORMAL HIGH (ref 0.00–0.07)
Basophils Absolute: 0.1 10*3/uL (ref 0.0–0.1)
Basophils Relative: 1 %
Eosinophils Absolute: 0.1 10*3/uL (ref 0.0–0.5)
Eosinophils Relative: 1 %
HCT: 61.2 % — ABNORMAL HIGH (ref 39.0–52.0)
Hemoglobin: 18.8 g/dL — ABNORMAL HIGH (ref 13.0–17.0)
Immature Granulocytes: 5 %
Lymphocytes Relative: 22 %
Lymphs Abs: 4.1 10*3/uL — ABNORMAL HIGH (ref 0.7–4.0)
MCH: 29.3 pg (ref 26.0–34.0)
MCHC: 30.7 g/dL (ref 30.0–36.0)
MCV: 95.5 fL (ref 80.0–100.0)
Monocytes Absolute: 0.7 10*3/uL (ref 0.1–1.0)
Monocytes Relative: 4 %
Neutro Abs: 12.2 10*3/uL — ABNORMAL HIGH (ref 1.7–7.7)
Neutrophils Relative %: 67 %
Platelets: 279 10*3/uL (ref 150–400)
RBC: 6.41 MIL/uL — ABNORMAL HIGH (ref 4.22–5.81)
RDW: 15.2 % (ref 11.5–15.5)
WBC: 18.1 10*3/uL — ABNORMAL HIGH (ref 4.0–10.5)
nRBC: 0.2 % (ref 0.0–0.2)

## 2020-05-19 LAB — COMPREHENSIVE METABOLIC PANEL
ALT: 410 U/L — ABNORMAL HIGH (ref 0–44)
AST: 492 U/L — ABNORMAL HIGH (ref 15–41)
Albumin: 3.4 g/dL — ABNORMAL LOW (ref 3.5–5.0)
Alkaline Phosphatase: 139 U/L — ABNORMAL HIGH (ref 38–126)
Anion gap: 21 — ABNORMAL HIGH (ref 5–15)
BUN: 19 mg/dL (ref 8–23)
CO2: 16 mmol/L — ABNORMAL LOW (ref 22–32)
Calcium: 9.9 mg/dL (ref 8.9–10.3)
Chloride: 102 mmol/L (ref 98–111)
Creatinine, Ser: 1.29 mg/dL — ABNORMAL HIGH (ref 0.61–1.24)
GFR, Estimated: >60 mL/min (ref >60)
Glucose, Bld: 348 mg/dL — ABNORMAL HIGH (ref 70–99)
Potassium: 4.3 mmol/L (ref 3.5–5.1)
Sodium: 139 mmol/L (ref 135–145)
Total Bilirubin: 1.7 mg/dL — ABNORMAL HIGH (ref 0.3–1.2)

## 2020-05-19 LAB — GLUCOSE, CAPILLARY
Glucose-Capillary: 358 mg/dL — ABNORMAL HIGH (ref 70–99)
Glucose-Capillary: 347 mg/dL — ABNORMAL HIGH (ref 70–99)

## 2020-05-19 LAB — MAGNESIUM: Magnesium: 2.8 mg/dL — ABNORMAL HIGH (ref 1.7–2.4)

## 2020-05-19 LAB — PROTIME-INR
INR: 1.6 — ABNORMAL HIGH (ref 0.8–1.2)
Prothrombin Time: 18.7 seconds — ABNORMAL HIGH (ref 11.4–15.2)

## 2020-05-19 LAB — LACTIC ACID, PLASMA: Lactic Acid, Venous: 8.3 mmol/L (ref 0.5–1.9)

## 2020-05-19 LAB — TROPONIN I (HIGH SENSITIVITY)
Troponin I (High Sensitivity): 2566 ng/L (ref <18)
Troponin I (High Sensitivity): 9947 ng/L (ref <18)

## 2020-05-19 SURGERY — CORONARY/GRAFT ACUTE MI REVASCULARIZATION
Anesthesia: LOCAL

## 2020-05-19 SURGERY — LEFT HEART CATH AND CORONARY ANGIOGRAPHY
Anesthesia: LOCAL

## 2020-05-19 MED ORDER — NITROGLYCERIN 0.4 MG SL SUBL: 0.4000 mg | SUBLINGUAL_TABLET | SUBLINGUAL | Status: DC | PRN

## 2020-05-19 MED ORDER — HEPARIN (PORCINE) IN NACL 1000-0.9 UT/500ML-% IV SOLN
INTRAVENOUS | Status: AC
  Filled 2020-05-19: qty 1500

## 2020-05-19 MED ORDER — SODIUM BICARBONATE 8.4 % IV SOLN
100.0000 meq | Freq: Once | INTRAVENOUS | Status: AC
  Administered 2020-05-19: 100 meq via INTRAVENOUS

## 2020-05-19 MED ORDER — SODIUM CHLORIDE 0.9 % IV SOLN
INTRAVENOUS | Status: AC | PRN
  Administered 2020-05-19: 999 mL/h via INTRAVENOUS

## 2020-05-19 MED ORDER — HEPARIN (PORCINE) 25000 UT/250ML-% IV SOLN
1600.0000 [IU]/h | INTRAVENOUS | Status: AC
  Administered 2020-05-20: 1200 [IU]/h via INTRAVENOUS
  Administered 2020-05-20 – 2020-05-21 (×2): 1600 [IU]/h via INTRAVENOUS
  Filled 2020-05-19 (×3): qty 250

## 2020-05-19 MED ORDER — CANGRELOR TETRASODIUM 50 MG IV SOLR
INTRAVENOUS | Status: AC
  Filled 2020-05-19: qty 50

## 2020-05-19 MED ORDER — SODIUM BICARBONATE 8.4 % IV SOLN: 100.0000 meq | Freq: Once | INTRAVENOUS | Status: DC

## 2020-05-19 MED ORDER — AMIODARONE HCL IN DEXTROSE 360-4.14 MG/200ML-% IV SOLN
INTRAVENOUS | Status: AC
  Filled 2020-05-19: qty 200

## 2020-05-19 MED ORDER — ONDANSETRON HCL 4 MG/2ML IJ SOLN: 4.0000 mg | Freq: Four times a day (QID) | INTRAMUSCULAR | Status: DC | PRN

## 2020-05-19 MED ORDER — ASPIRIN 300 MG RE SUPP
300.0000 mg | RECTAL | Status: DC
  Filled 2020-05-19: qty 1

## 2020-05-19 MED ORDER — DOCUSATE SODIUM 50 MG/5ML PO LIQD
100.0000 mg | Freq: Two times a day (BID) | ORAL | Status: DC
  Administered 2020-05-20 – 2020-05-24 (×6): 100 mg
  Filled 2020-05-19 (×6): qty 10

## 2020-05-19 MED ORDER — POLYETHYLENE GLYCOL 3350 17 G PO PACK: 17.0000 g | PACK | Freq: Every day | ORAL | Status: DC

## 2020-05-19 MED ORDER — MIDAZOLAM HCL 2 MG/2ML IJ SOLN: 1.0000 mg | INTRAMUSCULAR | Status: DC | PRN

## 2020-05-19 MED ORDER — POLYETHYLENE GLYCOL 3350 17 G PO PACK
17.0000 g | PACK | Freq: Every day | ORAL | Status: DC
  Administered 2020-05-21 – 2020-05-24 (×4): 17 g
  Filled 2020-05-19 (×5): qty 1

## 2020-05-19 MED ORDER — LIDOCAINE HCL (PF) 1 % IJ SOLN
INTRAMUSCULAR | Status: DC | PRN
  Administered 2020-05-19: 2 mL

## 2020-05-19 MED ORDER — MIDAZOLAM BOLUS VIA INFUSION
0.0000 mg | INTRAVENOUS | Status: DC | PRN
  Administered 2020-05-21: 1 mg via INTRAVENOUS
  Administered 2020-05-21: 2 mg via INTRAVENOUS
  Filled 2020-05-19: qty 5

## 2020-05-19 MED ORDER — IOHEXOL 350 MG/ML SOLN
INTRAVENOUS | Status: DC | PRN
  Administered 2020-05-19: 70 mL

## 2020-05-19 MED ORDER — MIDAZOLAM 50MG/50ML (1MG/ML) PREMIX INFUSION
0.0000 mg/h | INTRAVENOUS | Status: DC
  Administered 2020-05-19: 1 mg/h via INTRAVENOUS

## 2020-05-19 MED ORDER — POTASSIUM CHLORIDE 10 MEQ/50ML IV SOLN
10.0000 meq | INTRAVENOUS | Status: AC
  Administered 2020-05-20 (×3): 10 meq via INTRAVENOUS
  Filled 2020-05-19 (×3): qty 50

## 2020-05-19 MED ORDER — ACETAMINOPHEN 325 MG PO TABS
650.0000 mg | ORAL_TABLET | ORAL | Status: DC | PRN
  Filled 2020-05-19 (×2): qty 2

## 2020-05-19 MED ORDER — EPINEPHRINE HCL 5 MG/250ML IV SOLN IN NS
0.5000 ug/min | INTRAVENOUS | Status: DC
Start: 2020-05-20 — End: 2020-05-20
  Administered 2020-05-20 (×2): 20 ug/min via INTRAVENOUS
  Filled 2020-05-19 (×2): qty 250

## 2020-05-19 MED ORDER — DOBUTAMINE IN D5W 4-5 MG/ML-% IV SOLN
2.5000 ug/kg/min | INTRAVENOUS | Status: DC
  Administered 2020-05-19: 2.5 ug/kg/min via INTRAVENOUS
  Administered 2020-05-20: 20 ug/kg/min via INTRAVENOUS
  Filled 2020-05-19 (×2): qty 250

## 2020-05-19 MED ORDER — FENTANYL CITRATE (PF) 100 MCG/2ML IJ SOLN
INTRAMUSCULAR | Status: AC
  Administered 2020-05-19: 100 ug
  Filled 2020-05-19: qty 2

## 2020-05-19 MED ORDER — HEPARIN (PORCINE) IN NACL 1000-0.9 UT/500ML-% IV SOLN
INTRAVENOUS | Status: DC | PRN
  Administered 2020-05-19 (×2): 500 mL

## 2020-05-19 MED ORDER — LIDOCAINE HCL (PF) 1 % IJ SOLN
INTRAMUSCULAR | Status: AC
  Filled 2020-05-19: qty 30

## 2020-05-19 MED ORDER — CANGRELOR BOLUS VIA INFUSION
INTRAVENOUS | Status: DC | PRN
  Administered 2020-05-19: 3450 ug via INTRAVENOUS

## 2020-05-19 MED ORDER — HEPARIN SODIUM (PORCINE) 1000 UNIT/ML IJ SOLN
INTRAMUSCULAR | Status: DC | PRN
  Administered 2020-05-19: 2500 [IU] via INTRAVENOUS
  Administered 2020-05-19: 6000 [IU] via INTRAVENOUS

## 2020-05-19 MED ORDER — ATORVASTATIN CALCIUM 80 MG PO TABS
80.0000 mg | ORAL_TABLET | Freq: Every day | ORAL | Status: DC
  Administered 2020-05-20 – 2020-05-24 (×5): 80 mg
  Filled 2020-05-19 (×6): qty 1

## 2020-05-19 MED ORDER — ASPIRIN 81 MG PO CHEW: 324.0000 mg | CHEWABLE_TABLET | ORAL | Status: DC

## 2020-05-19 MED ORDER — ATORVASTATIN CALCIUM 80 MG PO TABS
80.0000 mg | ORAL_TABLET | Freq: Every day | ORAL | Status: DC
  Filled 2020-05-19: qty 1

## 2020-05-19 MED ORDER — SODIUM BICARBONATE 8.4 % IV SOLN
INTRAVENOUS | Status: AC
  Filled 2020-05-19: qty 50

## 2020-05-19 MED ORDER — FENTANYL BOLUS VIA INFUSION
25.0000 ug | INTRAVENOUS | Status: DC | PRN
  Administered 2020-05-19: 50 ug via INTRAVENOUS
  Filled 2020-05-19: qty 100

## 2020-05-19 MED ORDER — ONDANSETRON HCL 4 MG/2ML IJ SOLN
INTRAMUSCULAR | Status: AC
  Filled 2020-05-19: qty 2

## 2020-05-19 MED ORDER — FENTANYL 2500MCG IN NS 250ML (10MCG/ML) PREMIX INFUSION
25.0000 ug/h | INTRAVENOUS | Status: DC
  Administered 2020-05-19 (×2): 50 ug/h via INTRAVENOUS
  Filled 2020-05-19: qty 250

## 2020-05-19 MED ORDER — CLOPIDOGREL BISULFATE 300 MG PO TABS: 300.0000 mg | ORAL_TABLET | Freq: Once | ORAL | Status: DC

## 2020-05-19 MED ORDER — STERILE WATER FOR INJECTION IV SOLN
INTRAVENOUS | Status: DC
  Filled 2020-05-19 (×6): qty 1000

## 2020-05-19 MED ORDER — FENTANYL CITRATE (PF) 100 MCG/2ML IJ SOLN
50.0000 ug | Freq: Once | INTRAMUSCULAR | Status: AC
  Administered 2020-05-24: 50 ug via INTRAVENOUS
  Filled 2020-05-19: qty 2

## 2020-05-19 MED ORDER — HEPARIN SODIUM (PORCINE) 1000 UNIT/ML IJ SOLN
INTRAMUSCULAR | Status: AC
  Filled 2020-05-19: qty 1

## 2020-05-19 MED ORDER — DEXTROSE 50 % IV SOLN: 0.0000 mL | INTRAVENOUS | Status: DC | PRN

## 2020-05-19 MED ORDER — SODIUM CHLORIDE 0.9 % IV BOLUS
1000.0000 mL | Freq: Once | INTRAVENOUS | Status: AC
  Administered 2020-05-19: 1000 mL via INTRAVENOUS

## 2020-05-19 MED ORDER — FENTANYL 2500MCG IN NS 250ML (10MCG/ML) PREMIX INFUSION
50.0000 ug/h | INTRAVENOUS | Status: DC
  Administered 2020-05-20: 250 ug/h via INTRAVENOUS
  Administered 2020-05-20: 200 ug/h via INTRAVENOUS
  Administered 2020-05-21: 250 ug/h via INTRAVENOUS
  Administered 2020-05-21: 200 ug/h via INTRAVENOUS
  Administered 2020-05-21: 250 ug/h via INTRAVENOUS
  Administered 2020-05-22 (×2): 200 ug/h via INTRAVENOUS
  Administered 2020-05-23: 250 ug/h via INTRAVENOUS
  Administered 2020-05-23: 175 ug/h via INTRAVENOUS
  Administered 2020-05-24: 200 ug/h via INTRAVENOUS
  Administered 2020-05-24: 100 ug/h via INTRAVENOUS
  Filled 2020-05-19 (×10): qty 250

## 2020-05-19 MED ORDER — MIDAZOLAM 50MG/50ML (1MG/ML) PREMIX INFUSION
INTRAVENOUS | Status: AC
  Filled 2020-05-19: qty 50

## 2020-05-19 MED ORDER — ASPIRIN EC 81 MG PO TBEC
81.0000 mg | DELAYED_RELEASE_TABLET | Freq: Every day | ORAL | Status: DC
  Filled 2020-05-19: qty 1

## 2020-05-19 MED ORDER — FENTANYL BOLUS VIA INFUSION
50.0000 ug | INTRAVENOUS | Status: DC | PRN
  Administered 2020-05-20 – 2020-05-21 (×8): 50 ug via INTRAVENOUS
  Administered 2020-05-21: 100 ug via INTRAVENOUS
  Filled 2020-05-19: qty 100

## 2020-05-19 MED ORDER — DEXMEDETOMIDINE HCL IN NACL 400 MCG/100ML IV SOLN
0.4000 ug/kg/h | INTRAVENOUS | Status: DC
  Filled 2020-05-19: qty 100

## 2020-05-19 MED ORDER — CLOPIDOGREL BISULFATE 75 MG PO TABS: 75.0000 mg | ORAL_TABLET | Freq: Every day | ORAL | Status: DC

## 2020-05-19 MED ORDER — EPINEPHRINE HCL 5 MG/250ML IV SOLN IN NS
INTRAVENOUS | Status: AC
  Filled 2020-05-19: qty 250

## 2020-05-19 MED ORDER — NOREPINEPHRINE 4 MG/250ML-% IV SOLN
0.0000 ug/min | INTRAVENOUS | Status: DC
  Administered 2020-05-20: 50 ug/min via INTRAVENOUS
  Filled 2020-05-19 (×2): qty 250

## 2020-05-19 MED ORDER — SODIUM CHLORIDE 0.9 % IV SOLN
INTRAVENOUS | Status: DC | PRN
  Administered 2020-05-19: 4 ug/kg/min via INTRAVENOUS

## 2020-05-19 MED ORDER — FENTANYL CITRATE (PF) 100 MCG/2ML IJ SOLN
INTRAMUSCULAR | Status: AC
  Administered 2020-05-19: 75 ug
  Filled 2020-05-19: qty 2

## 2020-05-19 MED ORDER — CLOPIDOGREL BISULFATE 75 MG PO TABS
75.0000 mg | ORAL_TABLET | Freq: Every day | ORAL | Status: DC
  Administered 2020-05-21 – 2020-05-24 (×4): 75 mg
  Filled 2020-05-19 (×5): qty 1

## 2020-05-19 MED ORDER — MIDAZOLAM BOLUS VIA INFUSION
0.0000 mg | INTRAVENOUS | Status: DC | PRN
  Administered 2020-05-19 (×2): 1 mg via INTRAVENOUS
  Administered 2020-05-19: 2 mg via INTRAVENOUS
  Administered 2020-05-19: 1 mg via INTRAVENOUS
  Administered 2020-05-19 (×2): 2 mg via INTRAVENOUS
  Administered 2020-05-19: 1 mg via INTRAVENOUS
  Filled 2020-05-19: qty 5

## 2020-05-19 MED ORDER — MIDAZOLAM 50MG/50ML (1MG/ML) PREMIX INFUSION
0.0000 mg/h | INTRAVENOUS | Status: DC
  Administered 2020-05-20: 2 mg/h via INTRAVENOUS
  Administered 2020-05-20: 3 mg/h via INTRAVENOUS
  Administered 2020-05-21: 2 mg/h via INTRAVENOUS
  Filled 2020-05-19 (×3): qty 50

## 2020-05-19 MED ORDER — INSULIN REGULAR(HUMAN) IN NACL 100-0.9 UT/100ML-% IV SOLN
INTRAVENOUS | Status: DC
  Administered 2020-05-19: 5 [IU]/h via INTRAVENOUS
  Administered 2020-05-20 (×3): 30 [IU]/h via INTRAVENOUS
  Administered 2020-05-20: 27 [IU]/h via INTRAVENOUS
  Administered 2020-05-20: 24 [IU]/h via INTRAVENOUS
  Administered 2020-05-21: 14 [IU]/h via INTRAVENOUS
  Administered 2020-05-21: 6 [IU]/h via INTRAVENOUS
  Administered 2020-05-21: 8.5 [IU]/h via INTRAVENOUS
  Administered 2020-05-22: 5 [IU]/h via INTRAVENOUS
  Filled 2020-05-19 (×11): qty 100

## 2020-05-19 MED ORDER — VERAPAMIL HCL 2.5 MG/ML IV SOLN
INTRAVENOUS | Status: DC | PRN
  Administered 2020-05-19: 10 mL via INTRA_ARTERIAL

## 2020-05-19 MED ORDER — VERAPAMIL HCL 2.5 MG/ML IV SOLN
INTRAVENOUS | Status: AC
  Filled 2020-05-19: qty 2

## 2020-05-19 MED ORDER — VASOPRESSIN 20 UNITS/100 ML INFUSION FOR SHOCK
0.0000 [IU]/min | INTRAVENOUS | Status: DC
  Administered 2020-05-19 – 2020-05-21 (×6): 0.04 [IU]/min via INTRAVENOUS
  Administered 2020-05-22: 0.03 [IU]/min via INTRAVENOUS
  Administered 2020-05-22 (×2): 0.04 [IU]/min via INTRAVENOUS
  Administered 2020-05-23 (×3): 0.03 [IU]/min via INTRAVENOUS
  Administered 2020-05-24: 0.04 [IU]/min via INTRAVENOUS
  Administered 2020-05-24: 0.03 [IU]/min via INTRAVENOUS
  Filled 2020-05-19 (×14): qty 100

## 2020-05-19 MED ORDER — SODIUM CHLORIDE 0.9 % IV SOLN
2.0000 g | INTRAVENOUS | Status: AC
  Administered 2020-05-20 – 2020-05-25 (×7): 2 g via INTRAVENOUS
  Filled 2020-05-19 (×3): qty 20
  Filled 2020-05-19 (×4): qty 2

## 2020-05-19 SURGICAL SUPPLY — 18 items
BALLN TREK OTW 2.5X12 (BALLOONS) ×2
BALLOON TREK OTW 2.5X12 (BALLOONS) ×1 IMPLANT
CATH 5FR JL3.5 JR4 ANG PIG MP (CATHETERS) ×2 IMPLANT
CATH INFINITI JR4 5F (CATHETERS) ×2 IMPLANT
CATH VISTA GUIDE 6FR JR4 (CATHETERS) ×4 IMPLANT
DEVICE RAD COMP TR BAND LRG (VASCULAR PRODUCTS) ×2 IMPLANT
GLIDESHEATH SLEND A-KIT 6F 22G (SHEATH) ×2 IMPLANT
GUIDEWIRE INQWIRE 1.5J.035X260 (WIRE) ×1 IMPLANT
INQWIRE 1.5J .035X260CM (WIRE) ×2
KIT HEART LEFT (KITS) ×2 IMPLANT
PACK CARDIAC CATHETERIZATION (CUSTOM PROCEDURE TRAY) ×2 IMPLANT
SHEATH PINNACLE 6F 10CM (SHEATH) ×2 IMPLANT
TRANSDUCER W/STOPCOCK (MISCELLANEOUS) ×2 IMPLANT
TUBING CIL FLEX 10 FLL-RA (TUBING) ×2 IMPLANT
WIRE ASAHI PROWATER 180CM (WIRE) ×2 IMPLANT
WIRE EMERALD 3MM-J .035X150CM (WIRE) ×2 IMPLANT
WIRE FIGHTER CROSSING 190CM (WIRE) ×2 IMPLANT
WIRE HI TORQ WHISPER MS 300CM (WIRE) ×2 IMPLANT

## 2020-05-19 NOTE — Progress Notes (Signed)
Patient transported from Cath Lab to 7M09 with no complications noted.

## 2020-05-19 NOTE — Consult Note (Addendum)
STEMI ACTIVATION   The patient was transported to [facility] as a STEMI.  Encounter with the patient occurred at 6:30 PM.  Sparse data is available concerning the patient's history.  A secondhand history from Dr. Ellyn Hack is at the patient suffered collapse, underwent between 45 and 50 minutes of CPR before ROSC.  Immediately upon ROSC he had ischemic appearing EKG with wide bizarre complex as is seen frequently postarrest.  We have no current data relative to acid-base status, lactate level, or kidney function.  The patient has prior abnormal EKG with left anterior hemiblock scree AV block, and poor R wave progression May 30, 2012.  The 05/19/2020 EKG reveals new right bundle branch block, left axis deviation, with inferoposterior lead J-point elevation.  Unable to exclude STEMI.  Prolonged CPR, greater than 45 minutes, with presumed significant acid-base disturbance and requiring pressor therapy (Levophed 45 mcg), the decision has been made to not perform immediate cardiac catheterization.  EKG demonstrates right bundle and could be concealing acute inferior ST segment changes.  As noted below the patient denies chest discomfort.  Chest x-ray demonstrates pulmonary nodule opacities likely representing pulmonary edema.  STEMI has been canceled.  Plan to gather data including lactic acid, BUN and creatinine blood gases, reassess neurological status, cycle cardiac markers, cycle EKGs, and perform cardiac catheterization when prudent as he recovers over the next 24 to 48 hours.  Should he develop chest pain or clear-cut EKG evidence of acute injury, the decision to perform angiography may be influenced.  2D Doppler echocardiogram will be ordered.  IV heparin should be started.   Addendum: 7:04 PM  Wife is now at the bedside.  At around 2:30 P.M., he became dizzy and collapsed.  She immediately began CPR.  EMS was summoned.  After arriving they took over CPR.  They eventually performed multiple  defibrillatory shocks.  CPR went for longer than 30 minutes.  He has no history of cardiac disease although a prior EKG in the [facility] system demonstrated left axis deviation consistent with left anterior hemiblock.  He receives his care at the [facility]  The patient is awake.  He is complaining of sore throat from intubation.  He denies chest discomfort.  Cardiac auscultation does not demonstrate a gallop.  Life support apparatus interferes with physical exam.  He is awake and able to follow commands.  Chest x-ray airspace opacity with an endotracheal tube in place.  Addendum: 7:26 PM  Portable bedside echo suggests inferobasal severe hypokinesis with septal bounce.  RV is generous but systolic function is normal.  EF is in the 40 to 50% range.  Spoke with critical care.  Plan will be to start dual antiplatelet therapy, IV heparin, diurese the patient to improve pulmonary edema, and potentially plan coronary angiography early tomorrow morning if clinically stable.  Hopefully by then, Levophed dose will be significantly decreased and acid-base status will be improved.Marland Kitchen  CRITICAL CARE TIME 50 minutes

## 2020-05-19 NOTE — CV Procedure (Signed)
   Initial LVEDP 14 to 15 mmHg.  Occlusion of the mid RCA after the origin of a moderate sized acute marginal branch.  RCA is very well collateralized by left to right collaterals.  Left coronary is widely patent including left main, LAD, and circumflex.  Attempted to cross the occlusion in the mid right coronary was unsuccessful.  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  LVEDP at end of case 18 to 20 mmHg after receiving vigorous IV hydration.  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

## 2020-05-19 NOTE — Progress Notes (Addendum)
Patient transported to cath lab. PEEP increased on the way due to patient oxygen saturations dropping. Patient will need ABG. RT did not obtain ABG before transporting patient to cath lab due to RT waiting on a-line to be placed.

## 2020-05-19 NOTE — Consult Note (Incomplete)
NAME:  [name], MRN:  [id number], DOB:  21-May-1955, LOS: 0 ADMISSION DATE:  (Not on file), CONSULTATION DATE: 5/17 REFERRING MD:  Ellyn Hack , CHIEF COMPLAINT:  S/p cardiac arrest, vent management   History of Present Illness:  This is a 65 year old male w/ hx per below. Presented to ER at [facility].   Pertinent  Medical History  HTN, DM type 2 w/out complications, On insulin.  HL RBBB 1rst degree HB Obesity GERD H/o colon polyps Chronic pain Spinal stenosis, osteoarthritis of right knee, prior cervical fusion and laminectomy  Anaphylaxis to Naproxen  Cataracts  Significant Hospital Events: Including procedures, antibiotic start and stop dates in addition to other pertinent events   .   Interim History / Subjective:  ***  Objective   There were no vitals taken for this visit. PAP: ()/()      No intake or output data in the 24 hours ending 05/19/20 1633 There were no vitals filed for this visit.  Examination: General: *** HENT: *** Lungs: *** Cardiovascular: *** Abdomen: *** Extremities: *** Neuro: *** GU: ***  Labs/imaging that I {ACTIONS; HAVE/HAVE NOT:19434}personally reviewed  (right click and "Reselect all SmartList Selections" daily)  ***  Resolved Hospital Problem list   ***  Assessment & Plan:  Cardiac arrest secondary to acute inferior wall STEMI (POA) c/b post-resuscitation cardiogenic shock Plan  Acute Hypoxic and hypercarbic respiratory failure s/p cardiac arrest complicated by diffuse bilateral airspace disease which could represent pulmonary edema, aspiration PNA or perhaps ALI Plan  Acute metabolic/hypoxic encephalopathy  Plan     Best practice (right click and "Reselect all SmartList Selections" daily)  Diet:  {OACZ:66063} Pain/Anxiety/Delirium protocol (if indicated): {Pain/Anxiety/Delirium:26941} VAP protocol (if indicated): {VAP:29640} DVT prophylaxis: {DVT Prophylaxis:26933} GI prophylaxis: {GI:26934} Glucose  control:  {Glucose Control:26935} Central venous access:  {Central Venous Access:26936} Arterial line:  {Central Venous Access:26936} Foley:  {Central Venous Access:26936} Mobility:  {Mobility:26937}  PT consulted: {PT Consult:26938} Last date of multidisciplinary goals of care discussion [***] Code Status:  {Code Status:26939} Disposition: ***  Labs   CBC: No results for input(s): WBC, NEUTROABS, HGB, HCT, MCV, PLT in the last 168 hours.  Basic Metabolic Panel: No results for input(s): NA, K, CL, CO2, GLUCOSE, BUN, CREATININE, CALCIUM, MG, PHOS in the last 168 hours. GFR: CrCl cannot be calculated (Patient's most recent lab result is older than the maximum 21 days allowed.). No results for input(s): PROCALCITON, WBC, LATICACIDVEN in the last 168 hours.  Liver Function Tests: No results for input(s): AST, ALT, ALKPHOS, BILITOT, PROT, ALBUMIN in the last 168 hours. No results for input(s): LIPASE, AMYLASE in the last 168 hours. No results for input(s): AMMONIA in the last 168 hours.  ABG No results found for: PHART, PCO2ART, PO2ART, HCO3, TCO2, ACIDBASEDEF, O2SAT   Coagulation Profile: No results for input(s): INR, PROTIME in the last 168 hours.  Cardiac Enzymes: No results for input(s): CKTOTAL, CKMB, CKMBINDEX, TROPONINI in the last 168 hours.  HbA1C: Hemoglobin A1C  Date/Time Value Ref Range Status  05/25/2016 12:09 PM 8.2  Final   Hgb A1c MFr Bld  Date/Time Value Ref Range Status  10/27/2015 12:14 PM 7.8 (H) 4.6 - 6.5 % Final    Comment:    Glycemic Control Guidelines for People with Diabetes:Non Diabetic:  <6%Goal of Therapy: <7%Additional Action Suggested:  >8%   07/15/2015 11:41 AM 7.5 (H) 4.6 - 6.5 % Final    Comment:    Glycemic Control Guidelines for People with Diabetes:Non Diabetic:  <  6%Goal of Therapy: <7%Additional Action Suggested:  >8%     CBG: No results for input(s): GLUCAP in the last 168 hours.  Review of Systems:   ***  Past Medical History:   He,  has a past medical history of Adenomatous polyp, Cataract, Chronic pain, Diabetes mellitus, GERD (gastroesophageal reflux disease), History of colonic polyps, History of hip replacement, total, anaphylactic shock, Hyperlipidemia, Hypertension, Left anterior hemiblock, Obesity, RBBB (right bundle branch block with left anterior fascicular block), and Trifascicular block.   Surgical History:   Past Surgical History:  Procedure Laterality Date  . APPENDECTOMY    . CERVICAL FUSION    . CERVICAL LAMINECTOMY    . COLONOSCOPY    . HAND SURGERY    . HARDWARE REMOVAL Left 09/10/2012   Procedure: HARDWARE REMOVAL;  Surgeon: Kerrin Champagne, MD;  Location: [facility] OR;  Service: Orthopedics;  Laterality: Left;  . HIP SURGERY  12/29/09   R THR  ([place])  . HIP SURGERY     bilateral  . TOTAL HIP ARTHROPLASTY Left 09/10/2012   Procedure: REMOVAL OF HARDWARE LEFT PROXIMAL THIGH/FEMUR AND LEFT TOTAL HIP ARTHROPLASTY;  Surgeon: Kerrin Champagne, MD;  Location: [facility] OR;  Service: Orthopedics;  Laterality: Left;  . TOTAL KNEE ARTHROPLASTY  01/2008   left knee     Social History:   reports that he quit smoking about 6 years ago. His smoking use included cigarettes. He has a 63.00 pack-year smoking history. He has quit using smokeless tobacco.  His smokeless tobacco use included chew. He reports that he does not drink alcohol and does not use drugs.   Family History:  His family history includes Alcohol abuse in his brother; Cancer in his mother; Colon polyps in his brother; Crohn's disease in his father. There is no history of Colon cancer.   Allergies Allergies  Allergen Reactions  . Codeine     REACTION: itch  . Morphine And Related Nausea And Vomiting  . Naproxen     REACTION: anaphylaxis     Home Medications  Prior to Admission medications   Medication Sig Start Date End Date Taking? Authorizing Provider  amoxicillin-clavulanate (AUGMENTIN) 875-125 MG tablet TAKE ONE TABLET BY MOUTH ONE HOUR PRIOR TO  DENTAL PROCEDURE THEN TAKE EVERY 12 HOURS FOR 2 DOSES 07/14/17   Kerrin Champagne, MD  atorvastatin (LIPITOR) 40 MG tablet Take 1 tablet (40 mg total) by mouth daily. 05/04/16   Gordy Savers, MD  Blood Glucose Monitoring Suppl (ONE TOUCH ULTRA SYSTEM KIT) W/DEVICE KIT 1 kit by Does not apply route once. 09/29/14   Gordy Savers, MD  diclofenac sodium (VOLTAREN) 1 % GEL Apply topically.      [provider]  fexofenadine (ALLEGRA) 180 MG tablet Take 1 tablet (180 mg total) by mouth daily. 10/27/15   Gordy Savers, MD  HYDROcodone-acetaminophen (NORCO) 7.5-325 MG tablet Take 1-2 tablets by mouth every 4 (four) hours as needed. 02/17/16   Kerrin Champagne, MD  Insulin Detemir (LEVEMIR FLEXTOUCH) 100 UNIT/ML Pen 100 units at bedtime 05/25/16   Gordy Savers, MD  Insulin Lispro (HUMALOG KWIKPEN) 200 UNIT/ML SOPN Inject 30 Units into the skin 3 (three) times daily with meals. 05/25/16   Gordy Savers, MD  Insulin Pen Needle (NOVOTWIST) 32G X 5 MM MISC USE TO INJECT INSULIN THREE TIMES A DAY 12/23/14   Gordy Savers, MD  lidocaine (LIDODERM) 5 % Place 1 patch onto the skin as needed.  01/30/12  [provider]  lisinopril-hydrochlorothiazide (PRINZIDE,ZESTORETIC) 20-25 MG tablet Take 1 tablet by mouth daily. 05/04/16   Marletta Lor, MD  metFORMIN (GLUCOPHAGE) 1000 MG tablet Take 1 tablet (1,000 mg total) by mouth 2 (two) times daily with a meal. 05/04/16   Marletta Lor, MD  Multiple Vitamins-Minerals (MULTIVITAMIN WITH MINERALS) tablet Take 1 tablet by mouth daily.    [provider]  Nystatin ([place]) 100000 UNIT/GM POWD Apply 1 Bottle topically as needed.  04/03/14   [provider]  omeprazole (PRILOSEC) 20 MG capsule TAKE 1 CAPSULE BY MOUTH  DAILY 11/25/15   Marletta Lor, MD  ONE [facility] ULTRA TEST test strip Check blood sugar daily and as needed 06/29/15   Marletta Lor, MD     Critical care time: ***

## 2020-05-19 NOTE — ED Notes (Signed)
Providers at bedside placing central line and A-line.

## 2020-05-19 NOTE — Progress Notes (Signed)
eLink Physician-Brief Progress Note Patient Name: [name] DOB: [birth year] MRN: [id number]   Date of Service  05/19/2020  HPI/Events of Note  Patient transferred from [facility] for PCI following inferior STEMI, cardiac arrest with 30 minutes of CPR, ROSC, ongoing ischemia necessitating urgent cardiac catheterization.  eICU Interventions  New Patient Evaluation.        Kerry Kass Lourine Alberico 05/19/2020, 10:11 PM

## 2020-05-19 NOTE — ED Provider Notes (Signed)
[facility] CATH LAB Provider Note   CSN: [id number] Arrival date & time: 05/19/20  1759     History No chief complaint on file.   [name] is a 65 y.o. male.  HPI   65 year old male presents to the emergency department as a transfer from [facility], intubated as a code STEMI.  Report is that patient had called EMS for a headache.  They got an EKG that looked suspicious for STEMI when the patient became unresponsive.  Patient reportedly had a total of 45 to 50 minutes of CPR, was intubated.  Got multiple rounds of epinephrine, calcium, bicarb.  He arrives sedated on propofol drip on Levophed with a bicarb drip and fluids.  At times he is eyes open and following commands, hypotensive.  EKG on arrival shows a right bundle branch block which is baseline for the patient, no STEMI criteria.  Past Medical History:  Diagnosis Date  . Adenomatous polyp   . Cataract    left eye for sure   . Chronic pain   . Diabetes mellitus   . GERD (gastroesophageal reflux disease)   . History of colonic polyps   . History of hip replacement, total   . Hx of anaphylactic shock   . Hyperlipidemia   . Hypertension   . Left anterior hemiblock   . Obesity   . RBBB (right bundle branch block with left anterior fascicular block)   . 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 Cardiac arrest ([place]) 05/19/2020  . Bilateral inguinal hernia 11/08/2012  . Osteoarthritis of left hip 09/13/2012    Class: Chronic  . Periprosthetic fracture around internal prosthetic left hip joint ([place]) 09/13/2012    Class: Acute  . Postoperative anemia due to acute blood loss 09/12/2012    Class: Acute  . Exertional dyspnea 06/12/2012  . Obesity 02/28/2012  . Chronic back pain 02/28/2012  . Osteoarthritis 10/15/2007  . GASTROENTERITIS 08/28/2007  . GERD 07/13/2007  . CELLULITIS, RIGHT LEG 06/26/2007  . SINUSITIS 09/12/2006  .  SEBACEOUS CYST, INFECTED 08/24/2006  . Type 2 diabetes mellitus without complications ([place]) 83/33/8329  . Dyslipidemia 08/02/2006  . Essential hypertension 08/02/2006  . History of colonic polyps 08/02/2006    Past Surgical History:  Procedure Laterality Date  . APPENDECTOMY    . CERVICAL FUSION    . CERVICAL LAMINECTOMY    . COLONOSCOPY    . HAND SURGERY    . HARDWARE REMOVAL Left 09/10/2012   Procedure: HARDWARE REMOVAL;  Surgeon: Jessy Oto, MD;  Location: [facility];  Service: Orthopedics;  Laterality: Left;  . HIP SURGERY  12/29/09   R THR  ([place])  . HIP SURGERY     bilateral  . TOTAL HIP ARTHROPLASTY Left 09/10/2012   Procedure: REMOVAL OF HARDWARE LEFT PROXIMAL THIGH/FEMUR AND LEFT TOTAL HIP ARTHROPLASTY;  Surgeon: Jessy Oto, MD;  Location: [facility];  Service: Orthopedics;  Laterality: Left;  . TOTAL KNEE ARTHROPLASTY  01/2008   left knee       Family History  Problem Relation Age of Onset  . Cancer Mother        lung  . Crohn's disease Father   . Alcohol abuse Brother   . Colon polyps Brother   . Colon cancer Neg Hx     Social History   Tobacco Use  . Smoking status: Former Smoker  Packs/day: 1.50    Years: 42.00    Pack years: 63.00    Types: Cigarettes    Quit date: 09/03/2013    Years since quitting: 6.7  . Smokeless tobacco: Former Systems developer    Types: Chew  . Tobacco comment: We discussed the need to remain smoke free and to call me for help if he has the desire to smoke again.  Substance Use Topics  . Alcohol use: No    Alcohol/week: 0.0 standard drinks    Comment: recovering alcoholic   . Drug use: No    Home Medications Prior to Admission medications   Medication Sig Start Date End Date Taking? Authorizing Provider  amoxicillin-clavulanate (AUGMENTIN) 875-125 MG tablet TAKE ONE TABLET BY MOUTH ONE HOUR PRIOR TO DENTAL PROCEDURE THEN TAKE EVERY 12 HOURS FOR 2 DOSES 07/14/17   Jessy Oto, MD  atorvastatin (LIPITOR) 40 MG tablet Take 1 tablet (40 mg  total) by mouth daily. 05/04/16   Marletta Lor, MD  Blood Glucose Monitoring Suppl (ONE TOUCH ULTRA SYSTEM KIT) W/DEVICE KIT 1 kit by Does not apply route once. 09/29/14   Marletta Lor, MD  diclofenac sodium (VOLTAREN) 1 % GEL Apply topically.      [provider]  fexofenadine (ALLEGRA) 180 MG tablet Take 1 tablet (180 mg total) by mouth daily. 10/27/15   Marletta Lor, MD  HYDROcodone-acetaminophen (NORCO) 7.5-325 MG tablet Take 1-2 tablets by mouth every 4 (four) hours as needed. 02/17/16   Jessy Oto, MD  Insulin Detemir (LEVEMIR FLEXTOUCH) 100 UNIT/ML Pen 100 units at bedtime 05/25/16   Marletta Lor, MD  Insulin Lispro (HUMALOG KWIKPEN) 200 UNIT/ML SOPN Inject 30 Units into the skin 3 (three) times daily with meals. 05/25/16   Marletta Lor, MD  Insulin Pen Needle (NOVOTWIST) 32G X 5 MM MISC USE TO INJECT INSULIN THREE TIMES A DAY 12/23/14   Marletta Lor, MD  lidocaine (LIDODERM) 5 % Place 1 patch onto the skin as needed.  01/30/12   [provider]  lisinopril-hydrochlorothiazide (PRINZIDE,ZESTORETIC) 20-25 MG tablet Take 1 tablet by mouth daily. 05/04/16   Marletta Lor, MD  metFORMIN (GLUCOPHAGE) 1000 MG tablet Take 1 tablet (1,000 mg total) by mouth 2 (two) times daily with a meal. 05/04/16   Marletta Lor, MD  Multiple Vitamins-Minerals (MULTIVITAMIN WITH MINERALS) tablet Take 1 tablet by mouth daily.    [provider]  Nystatin ([place]) 100000 UNIT/GM POWD Apply 1 Bottle topically as needed.  04/03/14   [provider]  omeprazole (PRILOSEC) 20 MG capsule TAKE 1 CAPSULE BY MOUTH  DAILY 11/25/15   Marletta Lor, MD  ONE TOUCH ULTRA TEST test strip Check blood sugar daily and as needed 06/29/15   Marletta Lor, MD    Allergies    Codeine, Morphine and related, and Naproxen  Review of Systems   Review of Systems  Unable to perform ROS: Intubated    Physical Exam Updated Vital  Signs BP (!) 57/38   Pulse (!) 115   Temp (!) 97 F (36.1 C) (Temporal)   Resp (!) 24   Ht $R'5\' 11"'vJ$  (1.803 m)   Wt 115 kg   SpO2 93%   BMI 35.36 kg/m   Physical Exam Vitals and nursing note reviewed.  Constitutional:      Appearance: Normal appearance. He is obese.  HENT:     Head: Normocephalic.  Eyes:     Pupils: Pupils are equal, round, and  reactive to light.  Cardiovascular:     Rate and Rhythm: Tachycardia present.  Pulmonary:     Comments: Intubated, bilateral breath sounds Abdominal:     Palpations: Abdomen is soft.  Skin:    General: Skin is warm.  Neurological:     Mental Status: He is alert.     Comments: Intubated and sedated     ED Results / Procedures / Treatments   Labs (all labs ordered are listed, but only abnormal results are displayed) Labs Reviewed  CBC WITH DIFFERENTIAL/PLATELET - Abnormal; Notable for the following components:      Result Value   WBC 18.1 (*)    RBC 6.41 (*)    Hemoglobin 18.8 (*)    HCT 61.2 (*)    Neutro Abs 12.2 (*)    Lymphs Abs 4.1 (*)    Abs Immature Granulocytes 0.96 (*)    All other components within normal limits  COMPREHENSIVE METABOLIC PANEL - Abnormal; Notable for the following components:   CO2 16 (*)    Glucose, Bld 348 (*)    Creatinine, Ser 1.29 (*)    Albumin 3.4 (*)    AST 492 (*)    ALT 410 (*)    Alkaline Phosphatase 139 (*)    Total Bilirubin 1.7 (*)    Anion gap 21 (*)    All other components within normal limits  MAGNESIUM - Abnormal; Notable for the following components:   Magnesium 2.8 (*)    All other components within normal limits  TROPONIN I (HIGH SENSITIVITY) - Abnormal; Notable for the following components:   Troponin I (High Sensitivity) 2,566 (*)    All other components within normal limits  RESP PANEL BY RT-PCR (FLU A&B, COVID) ARPGX2  LACTIC ACID, PLASMA  PROTIME-INR  BLOOD GAS, ARTERIAL  HIV ANTIBODY (ROUTINE TESTING W REFLEX)  BASIC METABOLIC PANEL  LIPID PANEL  CBC   PROTIME-INR  TROPONIN I (HIGH SENSITIVITY)    EKG EKG Interpretation  Date/Time:  Tuesday May 19 2020 19:35:43 EDT Ventricular Rate:  110 PR Interval:  108 QRS Duration: 155 QT Interval:  355 QTC Calculation: 481 R Axis:   257 Text Interpretation: Sinus tachycardia Ventricular premature complex Right bundle branch block Sinus tachycardia, RBBB old Confirmed by Lavenia Atlas [id number]) on 05/19/2020 7:53:20 PM   Radiology DG Chest Port 1 View  Result Date: 05/19/2020 CLINICAL DATA:  Check endotracheal tube placement, STEMI EXAM: PORTABLE CHEST 1 VIEW COMPARISON:  Film from earlier in the same day. FINDINGS: Cardiac shadow is stable. Diffuse airspace opacity is noted bilaterally but slightly improved when compared with the prior exam likely representing edema given the clinical history. Endotracheal tube is again noted 7.9 cm above the carina. No other focal abnormality is noted. IMPRESSION: Endotracheal tube as described. Slight improved aeration is noted. Electronically Signed   By: Inez Catalina M.D.   On: 05/19/2020 18:58    Procedures .Critical Care Performed by: Lorelle Gibbs, DO Authorized by: Lorelle Gibbs, DO   Critical care provider statement:    Critical care time (minutes):  45   Critical care was necessary to treat or prevent imminent or life-threatening deterioration of the following conditions:  Circulatory failure, respiratory failure, shock and cardiac failure   Critical care was time spent personally by me on the following activities:  Discussions with consultants, evaluation of patient's response to treatment, examination of patient, ordering and performing treatments and interventions, ordering and review of laboratory studies, ordering and review of radiographic  studies, pulse oximetry, re-evaluation of patient's condition, obtaining history from patient or surrogate and review of old charts     Medications Ordered in ED Medications  fentaNYL 2557mcg in  NS 214mL (18mcg/ml) infusion-PREMIX (100 mcg/hr Intravenous Rate/Dose Change 05/19/20 2049)  ondansetron (ZOFRAN) 4 MG/2ML injection (has no administration in time range)  docusate (COLACE) 50 MG/5ML liquid 100 mg ( Per Tube Automatically Held 05/27/20 2200)  lidocaine (PF) (XYLOCAINE) 1 % injection (2 mLs  Given 05/19/20 2042)  Radial Cocktail/Verapamil only (10 mLs Intra-arterial Given 05/19/20 2042)  heparin sodium (porcine) injection (2,500 Units Intravenous Given 05/19/20 2101)  cangrelor [facility]) bolus via infusion (3,450 mcg Intravenous Given 05/19/20 2100)  cangrelor (KENGREAL) 50,000 mcg in sodium chloride 0.9 % 250 mL (200 mcg/mL) infusion (4 mcg/kg/min  115 kg Intravenous New Bag/Given 05/19/20 2100)  aspirin chewable tablet 324 mg (has no administration in time range)    Or  aspirin suppository 300 mg (has no administration in time range)  aspirin EC tablet 81 mg (has no administration in time range)  nitroGLYCERIN (NITROSTAT) SL tablet 0.4 mg (has no administration in time range)  acetaminophen (TYLENOL) tablet 650 mg (has no administration in time range)  ondansetron (ZOFRAN) injection 4 mg (has no administration in time range)  clopidogrel (PLAVIX) tablet 75 mg (has no administration in time range)  clopidogrel (PLAVIX) tablet 300 mg (has no administration in time range)  atorvastatin (LIPITOR) tablet 80 mg (has no administration in time range)  insulin regular, human (MYXREDLIN) 100 units/ 100 mL infusion (has no administration in time range)  dextrose 50 % solution 0-50 mL (has no administration in time range)  polyethylene glycol (MIRALAX / GLYCOLAX) packet 17 g (has no administration in time range)  fentaNYL (SUBLIMAZE) injection 50 mcg (has no administration in time range)  fentaNYL 2571mcg in NS 275mL (12mcg/ml) infusion-PREMIX (has no administration in time range)  fentaNYL (SUBLIMAZE) bolus via infusion 50-100 mcg (has no administration in time range)  midazolam (VERSED) 50  mg/50 mL (1 mg/mL) premix infusion (has no administration in time range)  midazolam (VERSED) bolus via infusion 0-5 mg (has no administration in time range)  norepinephrine (LEVOPHED) 4mg  in 279mL premix infusion (50 mcg/min Intravenous Handoff 05/19/20 2027)  fentaNYL (SUBLIMAZE) 100 MCG/2ML injection (100 mcg  Given 05/19/20 1837)  fentaNYL (SUBLIMAZE) 100 MCG/2ML injection (75 mcg  Given 05/19/20 1822)  EPINEPHrine NaCl 4-0.9 MG/250ML-% premix infusion (5 mcg/min  Handoff 05/19/20 2028)    ED Course  I have reviewed the triage vital signs and the nursing notes.  Pertinent labs & imaging results that were available during my care of the patient were reviewed by me and considered in my medical decision making (see chart for details).    MDM Rules/Calculators/A&P                          65 year old male arrives to the emergency department intubated as a code STEMI.  Concern for STEMI criteria and EKG done in the field prior to cardiac arrest.  Patient arrives sedated on pressors and bicarb drip.  Hypotensive.  Patient transition to fentanyl drip, pressors increased.  EKG on arrival shows right bundle branch block, no STEMI criteria, interventional cardiologist Dr. Tamala Julian confirms EKG findings.  Code STEMI canceled.  Plan for metabolic work-up and imaging with ICU admission.  Final Clinical Impression(s) / ED Diagnoses Final diagnoses:  Cardiac arrest [facility])    Rx / DC Orders ED Discharge Orders  None       Lorelle Gibbs, DO 05/19/20 2123

## 2020-05-19 NOTE — Progress Notes (Signed)
ANTICOAGULATION CONSULT NOTE - Initial Consult  Pharmacy Consult for heparin Indication: ACS/cardiac arrest  Allergies  Allergen Reactions  . Codeine     REACTION: itch  . Morphine And Related Nausea And Vomiting  . Naproxen     REACTION: anaphylaxis    Patient Measurements: Height: 5\' 11"  (180.3 cm) Weight: 115 kg (253 lb 8.5 oz) IBW/kg (Calculated) : 75.3 Heparin Dosing Weight: 100kg  Vital Signs: Temp: 97 F (36.1 C) (05/17 1924) Temp Source: Temporal (05/17 1924) BP: 135/93 (05/17 2200) Pulse Rate: 98 (05/17 2200)  Labs: Recent Labs    05/19/20 1818 05/19/20 2221  HGB 18.8*  --   HCT 61.2*  --   PLT 279  --   LABPROT  --  18.7*  INR  --  1.6*  CREATININE 1.29*  --   TROPONINIHS 2,566* 9,947*    Estimated Creatinine Clearance: 72.7 mL/min (A) (by C-G formula based on SCr of 1.29 mg/dL (H)).   Medical History: Past Medical History:  Diagnosis Date  . Adenomatous polyp   . Cataract    left eye for sure   . Chronic pain   . Diabetes mellitus   . GERD (gastroesophageal reflux disease)   . History of colonic polyps   . History of hip replacement, total   . Hx of anaphylactic shock   . Hyperlipidemia   . Hypertension   . Left anterior hemiblock   . Obesity   . RBBB (right bundle branch block with left anterior fascicular block)   . 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 amoxicillin-clavulanate (AUGMENTIN) 875-125 MG tablet TAKE ONE TABLET BY MOUTH ONE HOUR PRIOR TO DENTAL PROCEDURE THEN TAKE EVERY 12 HOURS FOR 2 DOSES 3 tablet 1   . atorvastatin (LIPITOR) 40 MG tablet Take 1 tablet (40 mg total) by mouth daily. 30 tablet 1   . Blood Glucose Monitoring Suppl (ONE TOUCH ULTRA SYSTEM KIT) W/DEVICE KIT 1 kit by Does not apply route once. 1 each 0   . diclofenac sodium (VOLTAREN) 1 % GEL Apply topically.       . fexofenadine (ALLEGRA) 180 MG tablet  Take 1 tablet (180 mg total) by mouth daily. 90 tablet 3   . HYDROcodone-acetaminophen (NORCO) 7.5-325 MG tablet Take 1-2 tablets by mouth every 4 (four) hours as needed. 60 tablet 0   . Insulin Detemir (LEVEMIR FLEXTOUCH) 100 UNIT/ML Pen 100 units at bedtime 30 pen 1   . Insulin Lispro (HUMALOG KWIKPEN) 200 UNIT/ML SOPN Inject 30 Units into the skin 3 (three) times daily with meals. 3 pen 6   . Insulin Pen Needle (NOVOTWIST) 32G X 5 MM MISC USE TO INJECT INSULIN THREE TIMES A DAY 100 each 4   . lidocaine (LIDODERM) 5 % Place 1 patch onto the skin as needed.      Marland Kitchen lisinopril-hydrochlorothiazide (PRINZIDE,ZESTORETIC) 20-25 MG tablet Take 1 tablet by mouth daily. 30 tablet 1   . metFORMIN (GLUCOPHAGE) 1000 MG tablet Take 1 tablet (1,000 mg total) by mouth 2 (two) times daily with a meal. 60 tablet 1   . Multiple Vitamins-Minerals (MULTIVITAMIN WITH MINERALS) tablet Take 1 tablet by mouth daily.     Marland Kitchen Nystatin (NYAMYC) 100000 UNIT/GM POWD Apply 1 Bottle topically as needed.      Marland Kitchen omeprazole (PRILOSEC) 20 MG capsule TAKE 1 CAPSULE BY MOUTH  DAILY 90 capsule 3   .  ONE TOUCH ULTRA TEST test strip Check blood sugar daily and as needed 200 each 4    Scheduled:  . aspirin  324 mg Oral NOW   Or  . aspirin  300 mg Rectal NOW  . [START ON 05/20/2020] aspirin EC  81 mg Oral Daily  . [START ON 05/20/2020] atorvastatin  80 mg Per Tube Daily  . clopidogrel  300 mg Per Tube Once  . [START ON 05/20/2020] clopidogrel  75 mg Per Tube Q breakfast  . docusate  100 mg Per Tube BID  . fentaNYL (SUBLIMAZE) injection  50 mcg Intravenous Once  . ondansetron      . polyethylene glycol  17 g Per Tube Daily  . sodium bicarbonate      . [START ON 05/20/2020] sodium bicarbonate  100 mEq Intravenous Once   Infusions:  . amiodarone    . [START ON 05/20/2020] cefTRIAXone (ROCEPHIN)  IV    . DOBUTamine 2.5 mcg/kg/min (05/19/20 2146)  . [START ON 05/20/2020] epinephrine    . fentaNYL infusion INTRAVENOUS    . insulin 5  Units/hr (05/19/20 2129)  . midazolam    . norepinephrine (LEVOPHED) Adult infusion    . [START ON 05/20/2020] potassium chloride    . [START ON 05/20/2020]  sodium bicarbonate (isotonic) infusion in sterile water    . vasopressin 0.04 Units/min (05/19/20 2249)    Assessment: 65yo male c/o CP at OSH >> suffered Vfib arrest requiring 31min of CPR, tx'd to [facility] and sent to cath lab for STEMI vs NSTEMI, now post-cath during which no intervention could be completed, to start heparin 4h after sheath removed as d/w cards.  Of note INR elevated (1.6) d/t elevated transaminase.  Goal of Therapy:  Heparin level 0.3-0.7 units/ml Monitor platelets by anticoagulation protocol: Yes   Plan:  At 2a will start heparin gtt at 1200 units/hr and monitor heparin levels and CBC.  Wynona Neat, PharmD, BCPS  05/19/2020,11:30 PM

## 2020-05-19 NOTE — ED Notes (Signed)
Troponin level 2566 called in by lab and will alert provider

## 2020-05-19 NOTE — H&P (Addendum)
NAME:  [name], MRN:  [id number], DOB:  March 05, 1955, LOS: 0 ADMISSION DATE:  05/19/2020, CONSULTATION DATE: 05/19/2020 REFERRING MD: Sammuel Hines [facility], CHIEF COMPLAINT: Cardiogenic shock  History of Present Illness:  65 year old man who presented with chest pain at [place] to have inferior wall STEMI on EKG.  Suffered VF cardiac arrest requiring up to 30 minutes of CPR prior to ROSC.  Intubated sedated transferred down for potential PCI.  On arrival here EKG no longer showed evidence of inferior wall ST elevation.  Patient awake and following commands through sedation.  Plan initially to admit patient as non-STEMI and proceed to angiography in the morning following medical optimization.  Patient subsequently became hypotensive and had an episode of emesis.  Repeat EKG showed marked ST elevation in the inferior leads.  Patient did not lose consciousness.  Decision was made to bring the patient to the Cath Lab.  Pertinent  Medical History   Past Medical History:  Diagnosis Date  . Adenomatous polyp   . Cataract    left eye for sure   . Chronic pain   . Diabetes mellitus   . GERD (gastroesophageal reflux disease)   . History of colonic polyps   . History of hip replacement, total   . Hx of anaphylactic shock   . Hyperlipidemia   . Hypertension   . Left anterior hemiblock   . Obesity   . RBBB (right bundle branch block with left anterior fascicular block)   . 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 5/17-admitted to [facility].  Left femoral line inserted. . 5/17 coronary angiography with PCI RCA lesion.  Interim History / Subjective:  Underwent PCI mid RCA lesion.  Objective   Blood pressure (!) 57/38, pulse (!) 115, temperature (!) 97 F (36.1 C), temperature source Temporal, resp. rate (!) 24, height _0   (1.803 m), weight 115 kg, SpO2 93 %.    Vent Mode: PRVC FiO2 (%):  [40 %-100 %] 40 % Set Rate:  [16 bmp-20 bmp] 16 bmp Vt Set:  [600 mL] 600 mL PEEP:  [5 cmH20] 5 cmH20  No intake or output data in the 24 hours ending 05/19/20 2038 Filed Weights   05/19/20 1900  Weight: 115 kg    Examination: General: Morbidly obese. HENT: Orally intubated. Lungs: Diffuse rhon. Cardiovascular: Pale.  Cool extremities.  JVP not visible.  Heart sounds obscured by rhonchi. Abdomen: Protuberant but soft. Extremities: No peripheral edema Pedal pulses are palpable. Neuro: Sedated but able to follow commands and respond appropriately to questions by facial gestures GU: Foley catheter in place.  Clear urine  Point-of-care echocardiogram shows inferior wall motion abnormality.  RV moderately enlarged with moderate dysfunction.  Difficult study with poor acoustic windows.  Labs/imaging that I havepersonally reviewed  (right click and "Reselect all SmartList Selections" daily)  Elevated hemoglobin.  Leukocytosis at 18. Metabolic acidosis with CO2 16.  Creatinine mildly elevated 1.29. Hyperglycemic 348.  Resolved Hospital Problem list   None  Assessment & Plan:  Critically ill due to acute hypoxic respiratory failure secondary to acute pulmonary edema require mechanical ventilation Critically ill due to cardiogenic shock secondary to acute coronary syndrome likely involving the inferior wall requiring titration of vasoactive infusions. Possible RV involvement. Status post cardiac arrest. Uncontrolled type 2 diabetes Dyslipidemia Hypertension at baseline  Plan:  -  Full mechanical ventilatory support.  Will likely need diuresis prior to extubation. -Titrate epinephrine, norepinephrine infusions to maintain MAP greater than 65. -Amiodarone infusion to prevent further VT recurrence. -Dual antiplatelet therapy post PCI -Initiate secondary cardiac prevention -Echocardiogram in a.m.  Best practice  (right click and "Reselect all SmartList Selections" daily)  Diet:  NPO Pain/Anxiety/Delirium protocol (if indicated): Yes (RASS goal -1)-Versed and fentanyl infusions VAP protocol (if indicated): Yes DVT prophylaxis: Systemic AC GI prophylaxis: PPI Glucose control:  Insulin gtt Central venous access:  Yes, and it is still needed Arterial line:  Yes, and it is still needed Foley:  Yes, and it is still needed Mobility:  bed rest  PT consulted: N/A Last date of multidisciplinary goals of care discussion [wife updated at bedside] Code Status:  full code Disposition: ICU  Labs   CBC: Recent Labs  Lab 05/19/20 1818  WBC 18.1*  NEUTROABS 12.2*  HGB 18.8*  HCT 61.2*  MCV 95.5  PLT 272    Basic Metabolic Panel: Recent Labs  Lab 05/19/20 1818 05/19/20 1832  NA 139  --   K 4.3  --   CL 102  --   CO2 16*  --   GLUCOSE 348*  --   BUN 19  --   CREATININE 1.29*  --   CALCIUM 9.9  --   MG  --  2.8*   GFR: Estimated Creatinine Clearance: 72.7 mL/min (A) (by C-G formula based on SCr of 1.29 mg/dL (H)). Recent Labs  Lab 05/19/20 1818  WBC 18.1*    Liver Function Tests: Recent Labs  Lab 05/19/20 1818  AST 492*  ALT 410*  ALKPHOS 139*  BILITOT 1.7*  PROT RESULTS UNAVAILABLE DUE TO INTERFERING SUBSTANCE  ALBUMIN 3.4*   No results for input(s): LIPASE, AMYLASE in the last 168 hours. No results for input(s): AMMONIA in the last 168 hours.  ABG No results found for: PHART, PCO2ART, PO2ART, HCO3, TCO2, ACIDBASEDEF, O2SAT   Coagulation Profile: No results for input(s): INR, PROTIME in the last 168 hours.  Cardiac Enzymes: No results for input(s): CKTOTAL, CKMB, CKMBINDEX, TROPONINI in the last 168 hours.  HbA1C: Hemoglobin A1C  Date/Time Value Ref Range Status  05/25/2016 12:09 PM 8.2  Final   Hgb A1c MFr Bld  Date/Time Value Ref Range Status  10/27/2015 12:14 PM 7.8 (H) 4.6 - 6.5 % Final    Comment:    Glycemic Control Guidelines for People with Diabetes:Non  Diabetic:  <6%Goal of Therapy: <7%Additional Action Suggested:  >8%   07/15/2015 11:41 AM 7.5 (H) 4.6 - 6.5 % Final    Comment:    Glycemic Control Guidelines for People with Diabetes:Non Diabetic:  <6%Goal of Therapy: <7%Additional Action Suggested:  >8%     CBG: No results for input(s): GLUCAP in the last 168 hours.  Review of Systems:   Review of Systems  Unable to perform ROS: Critical illness   Past Medical History:  He,  has a past medical history of Adenomatous polyp, Cataract, Chronic pain, Diabetes mellitus, GERD (gastroesophageal reflux disease), History of colonic polyps, History of hip replacement, total, anaphylactic shock, Hyperlipidemia, Hypertension, Left anterior hemiblock, Obesity, RBBB (right bundle branch block with left anterior fascicular block), and Trifascicular block.   Surgical History:   Past Surgical History:  Procedure Laterality Date  . APPENDECTOMY    . CERVICAL FUSION    . CERVICAL LAMINECTOMY    . COLONOSCOPY    . HAND SURGERY    . HARDWARE REMOVAL Left 09/10/2012  Procedure: HARDWARE REMOVAL;  Surgeon: Jessy Oto, MD;  Location: [facility];  Service: Orthopedics;  Laterality: Left;  . HIP SURGERY  12/29/09   R THR  ([place])  . HIP SURGERY     bilateral  . TOTAL HIP ARTHROPLASTY Left 09/10/2012   Procedure: REMOVAL OF HARDWARE LEFT PROXIMAL THIGH/FEMUR AND LEFT TOTAL HIP ARTHROPLASTY;  Surgeon: Jessy Oto, MD;  Location: [facility];  Service: Orthopedics;  Laterality: Left;  . TOTAL KNEE ARTHROPLASTY  01/2008   left knee     Social History:   reports that he quit smoking about 6 years ago. His smoking use included cigarettes. He has a 63.00 pack-year smoking history. He has quit using smokeless tobacco.  His smokeless tobacco use included chew. He reports that he does not drink alcohol and does not use drugs.   Family History:  His family history includes Alcohol abuse in his brother; Cancer in his mother; Colon polyps in his brother; Crohn's disease  in his father. There is no history of Colon cancer.   Allergies Allergies  Allergen Reactions  . Codeine     REACTION: itch  . Morphine And Related Nausea And Vomiting  . Naproxen     REACTION: anaphylaxis     Home Medications  Prior to Admission medications   Medication Sig Start Date End Date Taking? Authorizing Provider  amoxicillin-clavulanate (AUGMENTIN) 875-125 MG tablet TAKE ONE TABLET BY MOUTH ONE HOUR PRIOR TO DENTAL PROCEDURE THEN TAKE EVERY 12 HOURS FOR 2 DOSES 07/14/17   Jessy Oto, MD  atorvastatin (LIPITOR) 40 MG tablet Take 1 tablet (40 mg total) by mouth daily. 05/04/16   Marletta Lor, MD  Blood Glucose Monitoring Suppl (ONE TOUCH ULTRA SYSTEM KIT) W/DEVICE KIT 1 kit by Does not apply route once. 09/29/14   Marletta Lor, MD  diclofenac sodium (VOLTAREN) 1 % GEL Apply topically.      [provider]  fexofenadine (ALLEGRA) 180 MG tablet Take 1 tablet (180 mg total) by mouth daily. 10/27/15   Marletta Lor, MD  HYDROcodone-acetaminophen (NORCO) 7.5-325 MG tablet Take 1-2 tablets by mouth every 4 (four) hours as needed. 02/17/16   Jessy Oto, MD  Insulin Detemir (LEVEMIR FLEXTOUCH) 100 UNIT/ML Pen 100 units at bedtime 05/25/16   Marletta Lor, MD  Insulin Lispro (HUMALOG KWIKPEN) 200 UNIT/ML SOPN Inject 30 Units into the skin 3 (three) times daily with meals. 05/25/16   Marletta Lor, MD  Insulin Pen Needle (NOVOTWIST) 32G X 5 MM MISC USE TO INJECT INSULIN THREE TIMES A DAY 12/23/14   Marletta Lor, MD  lidocaine (LIDODERM) 5 % Place 1 patch onto the skin as needed.  01/30/12   [provider]  lisinopril-hydrochlorothiazide (PRINZIDE,ZESTORETIC) 20-25 MG tablet Take 1 tablet by mouth daily. 05/04/16   Marletta Lor, MD  metFORMIN (GLUCOPHAGE) 1000 MG tablet Take 1 tablet (1,000 mg total) by mouth 2 (two) times daily with a meal. 05/04/16   Marletta Lor, MD  Multiple Vitamins-Minerals (MULTIVITAMIN WITH  MINERALS) tablet Take 1 tablet by mouth daily.    [provider]  Nystatin (Diagonal) 100000 UNIT/GM POWD Apply 1 Bottle topically as needed.  04/03/14   [provider]  omeprazole (PRILOSEC) 20 MG capsule TAKE 1 CAPSULE BY MOUTH  DAILY 11/25/15   Marletta Lor, MD  ONE [facility] ULTRA TEST test strip Check blood sugar daily and as needed 06/29/15   Marletta Lor, MD    CRITICAL  CARE Performed by: Kipp Brood   Total critical care time: 120 minutes  Critical care time was exclusive of separately billable procedures and treating other patients.  Critical care was necessary to treat or prevent imminent or life-threatening deterioration.  Critical care was time spent personally by me on the following activities: development of treatment plan with patient and/or surrogate as well as nursing, discussions with consultants, evaluation of patient's response to treatment, examination of patient, obtaining history from patient or surrogate, ordering and performing treatments and interventions, ordering and review of laboratory studies, ordering and review of radiographic studies, pulse oximetry, re-evaluation of patient's condition and participation in multidisciplinary rounds.  Kipp Brood, MD [facility] ICU Physician [place]  Pager: [phone] Mobile: [phone] After hours: [phone].

## 2020-05-19 NOTE — ED Triage Notes (Signed)
Pt bib carelink from [facility] as a stemi tx for the cathlab. Initial call for ems was for headache. On scene pt went unresponsive. EKG showed inferior stemi. Pt then went into a shockable rhythm. Defib X9. Pt received 49 total minutes of CPR. Pt given 9 epi, 3 amps bicarb, 3g calcium, 1 amp D50, amiodarone before gaining ROSC. pt started on epi drip at [facility] as well as propofol. Pt given 4000 units bolus of heparin and 14 units/hour drip. With carelink, pt given 159mcg fentanyl. Pt BP dropped to 55/30, started on Levophed. Bicarb drip going at 161ml/hour.

## 2020-05-20 ENCOUNTER — Encounter (HOSPITAL_COMMUNITY): Payer: Self-pay | Admitting: Interventional Cardiology

## 2020-05-20 ENCOUNTER — Inpatient Hospital Stay (HOSPITAL_COMMUNITY): Payer: No Typology Code available for payment source

## 2020-05-20 DIAGNOSIS — I469 Cardiac arrest, cause unspecified: Secondary | ICD-10-CM

## 2020-05-20 DIAGNOSIS — R579 Shock, unspecified: Secondary | ICD-10-CM | POA: Diagnosis not present

## 2020-05-20 DIAGNOSIS — R009 Unspecified abnormalities of heart beat: Secondary | ICD-10-CM

## 2020-05-20 LAB — GLUCOSE, CAPILLARY
Glucose-Capillary: 325 mg/dL — ABNORMAL HIGH (ref 70–99)
Glucose-Capillary: 97 mg/dL (ref 70–99)
Glucose-Capillary: 264 mg/dL — ABNORMAL HIGH (ref 70–99)
Glucose-Capillary: 290 mg/dL — ABNORMAL HIGH (ref 70–99)
Glucose-Capillary: 283 mg/dL — ABNORMAL HIGH (ref 70–99)
Glucose-Capillary: 291 mg/dL — ABNORMAL HIGH (ref 70–99)
Glucose-Capillary: 274 mg/dL — ABNORMAL HIGH (ref 70–99)
Glucose-Capillary: 279 mg/dL — ABNORMAL HIGH (ref 70–99)
Glucose-Capillary: 259 mg/dL — ABNORMAL HIGH (ref 70–99)
Glucose-Capillary: 242 mg/dL — ABNORMAL HIGH (ref 70–99)
Glucose-Capillary: 209 mg/dL — ABNORMAL HIGH (ref 70–99)
Glucose-Capillary: 194 mg/dL — ABNORMAL HIGH (ref 70–99)
Glucose-Capillary: 196 mg/dL — ABNORMAL HIGH (ref 70–99)
Glucose-Capillary: 197 mg/dL — ABNORMAL HIGH (ref 70–99)
Glucose-Capillary: 158 mg/dL — ABNORMAL HIGH (ref 70–99)
Glucose-Capillary: 152 mg/dL — ABNORMAL HIGH (ref 70–99)
Glucose-Capillary: 154 mg/dL — ABNORMAL HIGH (ref 70–99)

## 2020-05-20 LAB — COOXEMETRY PANEL
Carboxyhemoglobin: 0.4 % — ABNORMAL LOW (ref 0.5–1.5)
Carboxyhemoglobin: 0.6 % (ref 0.5–1.5)
Methemoglobin: 1 % (ref 0.0–1.5)
Methemoglobin: 1.1 % (ref 0.0–1.5)
O2 Saturation: 80.8 %
O2 Saturation: 85.7 %
Total hemoglobin: 14.5 g/dL (ref 12.0–16.0)
Total hemoglobin: 15.5 g/dL (ref 12.0–16.0)

## 2020-05-20 LAB — POCT I-STAT 7, (LYTES, BLD GAS, ICA,H+H)
Acid-base deficit: 7 mmol/L — ABNORMAL HIGH (ref 0.0–2.0)
Acid-base deficit: 12 mmol/L — ABNORMAL HIGH (ref 0.0–2.0)
Acid-base deficit: 2 mmol/L (ref 0.0–2.0)
Bicarbonate: 20.6 mmol/L (ref 20.0–28.0)
Bicarbonate: 18.1 mmol/L — ABNORMAL LOW (ref 20.0–28.0)
Bicarbonate: 27 mmol/L (ref 20.0–28.0)
Calcium, Ion: 1.08 mmol/L — ABNORMAL LOW (ref 1.15–1.40)
Calcium, Ion: 1.03 mmol/L — ABNORMAL LOW (ref 1.15–1.40)
Calcium, Ion: 1.14 mmol/L — ABNORMAL LOW (ref 1.15–1.40)
HCT: 42 % (ref 39.0–52.0)
HCT: 43 % (ref 39.0–52.0)
HCT: 47 % (ref 39.0–52.0)
Hemoglobin: 14.3 g/dL (ref 13.0–17.0)
Hemoglobin: 14.6 g/dL (ref 13.0–17.0)
Hemoglobin: 16 g/dL (ref 13.0–17.0)
O2 Saturation: 95 %
O2 Saturation: 100 %
O2 Saturation: 92 %
Patient temperature: 100
Patient temperature: 97.3
Patient temperature: 98.8
Potassium: 2.5 mmol/L — CL (ref 3.5–5.1)
Potassium: 2.7 mmol/L — CL (ref 3.5–5.1)
Potassium: 3 mmol/L — ABNORMAL LOW (ref 3.5–5.1)
Sodium: 144 mmol/L (ref 135–145)
Sodium: 141 mmol/L (ref 135–145)
Sodium: 146 mmol/L — ABNORMAL HIGH (ref 135–145)
TCO2: 22 mmol/L (ref 22–32)
TCO2: 20 mmol/L — ABNORMAL LOW (ref 22–32)
TCO2: 29 mmol/L (ref 22–32)
pCO2 arterial: 49.9 mmHg — ABNORMAL HIGH (ref 32.0–48.0)
pCO2 arterial: 55.1 mmHg — ABNORMAL HIGH (ref 32.0–48.0)
pCO2 arterial: 61.2 mmHg — ABNORMAL HIGH (ref 32.0–48.0)
pH, Arterial: 7.227 — ABNORMAL LOW (ref 7.350–7.450)
pH, Arterial: 7.125 — CL (ref 7.350–7.450)
pH, Arterial: 7.248 — ABNORMAL LOW (ref 7.350–7.450)
pO2, Arterial: 94 mmHg (ref 83.0–108.0)
pO2, Arterial: 225 mmHg — ABNORMAL HIGH (ref 83.0–108.0)
pO2, Arterial: 87 mmHg (ref 83.0–108.0)

## 2020-05-20 LAB — POCT I-STAT, CHEM 8
BUN: 26 mg/dL — ABNORMAL HIGH (ref 8–23)
Calcium, Ion: 1.05 mmol/L — ABNORMAL LOW (ref 1.15–1.40)
Chloride: 100 mmol/L (ref 98–111)
Creatinine, Ser: 1.7 mg/dL — ABNORMAL HIGH (ref 0.61–1.24)
Glucose, Bld: 366 mg/dL — ABNORMAL HIGH (ref 70–99)
HCT: 42 % (ref 39.0–52.0)
Hemoglobin: 14.3 g/dL (ref 13.0–17.0)
Potassium: 2.7 mmol/L — CL (ref 3.5–5.1)
Sodium: 147 mmol/L — ABNORMAL HIGH (ref 135–145)
TCO2: 27 mmol/L (ref 22–32)

## 2020-05-20 LAB — CBC
HCT: 44.3 % (ref 39.0–52.0)
HCT: 44.5 % (ref 39.0–52.0)
Hemoglobin: 14 g/dL (ref 13.0–17.0)
Hemoglobin: 14.1 g/dL (ref 13.0–17.0)
MCH: 29.2 pg (ref 26.0–34.0)
MCH: 29 pg (ref 26.0–34.0)
MCHC: 31.6 g/dL (ref 30.0–36.0)
MCHC: 31.7 g/dL (ref 30.0–36.0)
MCV: 92.3 fL (ref 80.0–100.0)
MCV: 91.6 fL (ref 80.0–100.0)
Platelets: 219 10*3/uL (ref 150–400)
Platelets: 162 10*3/uL (ref 150–400)
RBC: 4.8 MIL/uL (ref 4.22–5.81)
RBC: 4.86 MIL/uL (ref 4.22–5.81)
RDW: 14.4 % (ref 11.5–15.5)
RDW: 14.4 % (ref 11.5–15.5)
WBC: 23.4 10*3/uL — ABNORMAL HIGH (ref 4.0–10.5)
WBC: 16.6 10*3/uL — ABNORMAL HIGH (ref 4.0–10.5)
nRBC: 0.1 % (ref 0.0–0.2)
nRBC: 0 % (ref 0.0–0.2)

## 2020-05-20 LAB — BASIC METABOLIC PANEL
Anion gap: 16 — ABNORMAL HIGH (ref 5–15)
Anion gap: 16 — ABNORMAL HIGH (ref 5–15)
Anion gap: 13 (ref 5–15)
BUN: 27 mg/dL — ABNORMAL HIGH (ref 8–23)
BUN: 30 mg/dL — ABNORMAL HIGH (ref 8–23)
BUN: 30 mg/dL — ABNORMAL HIGH (ref 8–23)
CO2: 20 mmol/L — ABNORMAL LOW (ref 22–32)
CO2: 21 mmol/L — ABNORMAL LOW (ref 22–32)
CO2: 24 mmol/L (ref 22–32)
Calcium: 7.8 mg/dL — ABNORMAL LOW (ref 8.9–10.3)
Calcium: 7.1 mg/dL — ABNORMAL LOW (ref 8.9–10.3)
Calcium: 6.8 mg/dL — ABNORMAL LOW (ref 8.9–10.3)
Chloride: 104 mmol/L (ref 98–111)
Chloride: 104 mmol/L (ref 98–111)
Chloride: 101 mmol/L (ref 98–111)
Creatinine, Ser: 2.36 mg/dL — ABNORMAL HIGH (ref 0.61–1.24)
Creatinine, Ser: 2.82 mg/dL — ABNORMAL HIGH (ref 0.61–1.24)
Creatinine, Ser: 3.04 mg/dL — ABNORMAL HIGH (ref 0.61–1.24)
GFR, Estimated: 30 mL/min — ABNORMAL LOW (ref >60)
GFR, Estimated: 24 mL/min — ABNORMAL LOW (ref >60)
GFR, Estimated: 22 mL/min — ABNORMAL LOW (ref >60)
Glucose, Bld: 318 mg/dL — ABNORMAL HIGH (ref 70–99)
Glucose, Bld: 271 mg/dL — ABNORMAL HIGH (ref 70–99)
Glucose, Bld: 200 mg/dL — ABNORMAL HIGH (ref 70–99)
Potassium: 2.6 mmol/L — CL (ref 3.5–5.1)
Potassium: 2.9 mmol/L — ABNORMAL LOW (ref 3.5–5.1)
Potassium: 3 mmol/L — ABNORMAL LOW (ref 3.5–5.1)
Sodium: 140 mmol/L (ref 135–145)
Sodium: 141 mmol/L (ref 135–145)
Sodium: 138 mmol/L (ref 135–145)

## 2020-05-20 LAB — PROTIME-INR
INR: 1.3 — ABNORMAL HIGH (ref 0.8–1.2)
Prothrombin Time: 16.2 seconds — ABNORMAL HIGH (ref 11.4–15.2)

## 2020-05-20 LAB — HEPATIC FUNCTION PANEL
ALT: 234 U/L — ABNORMAL HIGH (ref 0–44)
AST: 249 U/L — ABNORMAL HIGH (ref 15–41)
Albumin: 2.5 g/dL — ABNORMAL LOW (ref 3.5–5.0)
Alkaline Phosphatase: 39 U/L (ref 38–126)
Bilirubin, Direct: 0.2 mg/dL (ref 0.0–0.2)
Indirect Bilirubin: 0.6 mg/dL (ref 0.3–0.9)
Total Bilirubin: 0.8 mg/dL (ref 0.3–1.2)
Total Protein: 4.6 g/dL — ABNORMAL LOW (ref 6.5–8.1)

## 2020-05-20 LAB — ECHOCARDIOGRAM COMPLETE
Area-P 1/2: 5.84 cm2
Height: 71 in
S' Lateral: 4.3 cm
Weight: 4955.94 oz

## 2020-05-20 LAB — TROPONIN I (HIGH SENSITIVITY)
Troponin I (High Sensitivity): 10906 ng/L (ref <18)
Troponin I (High Sensitivity): 9166 ng/L (ref <18)
Troponin I (High Sensitivity): 8258 ng/L (ref <18)
Troponin I (High Sensitivity): 7858 ng/L (ref <18)
Troponin I (High Sensitivity): 5624 ng/L (ref <18)

## 2020-05-20 LAB — RESP PANEL BY RT-PCR (FLU A&B, COVID) ARPGX2
Influenza A by PCR: NEGATIVE
Influenza B by PCR: NEGATIVE
SARS Coronavirus 2 by RT PCR: NEGATIVE

## 2020-05-20 LAB — HEPARIN LEVEL (UNFRACTIONATED)
Heparin Unfractionated: 0.12 IU/mL — ABNORMAL LOW (ref 0.30–0.70)
Heparin Unfractionated: 0.29 IU/mL — ABNORMAL LOW (ref 0.30–0.70)

## 2020-05-20 LAB — POCT ACTIVATED CLOTTING TIME
Activated Clotting Time: 285 seconds
Activated Clotting Time: >1000 seconds

## 2020-05-20 LAB — LACTIC ACID, PLASMA
Lactic Acid, Venous: 8.9 mmol/L (ref 0.5–1.9)
Lactic Acid, Venous: 7.2 mmol/L (ref 0.5–1.9)
Lactic Acid, Venous: 6.6 mmol/L (ref 0.5–1.9)
Lactic Acid, Venous: 4.2 mmol/L (ref 0.5–1.9)

## 2020-05-20 LAB — LIPID PANEL
Cholesterol: 110 mg/dL (ref 0–200)
HDL: 18 mg/dL — ABNORMAL LOW (ref >40)
LDL Cholesterol: 60 mg/dL (ref 0–99)
Total CHOL/HDL Ratio: 6.1 RATIO
Triglycerides: 160 mg/dL — ABNORMAL HIGH (ref <150)
VLDL: 32 mg/dL (ref 0–40)

## 2020-05-20 LAB — HIV ANTIBODY (ROUTINE TESTING W REFLEX): HIV Screen 4th Generation wRfx: NONREACTIVE

## 2020-05-20 LAB — MAGNESIUM: Magnesium: 1.2 mg/dL — ABNORMAL LOW (ref 1.7–2.4)

## 2020-05-20 LAB — MRSA PCR SCREENING: MRSA by PCR: NEGATIVE

## 2020-05-20 MED ORDER — SODIUM CHLORIDE 0.9% FLUSH: 3.0000 mL | INTRAVENOUS | Status: DC | PRN

## 2020-05-20 MED ORDER — ASPIRIN 81 MG PO CHEW
324.0000 mg | CHEWABLE_TABLET | Freq: Once | ORAL | Status: AC
  Administered 2020-05-20: 324 mg
  Filled 2020-05-20: qty 4

## 2020-05-20 MED ORDER — PERFLUTREN LIPID MICROSPHERE
INTRAVENOUS | Status: AC
  Filled 2020-05-20: qty 10

## 2020-05-20 MED ORDER — POTASSIUM CHLORIDE 10 MEQ/50ML IV SOLN
10.0000 meq | INTRAVENOUS | Status: DC
  Filled 2020-05-20: qty 50

## 2020-05-20 MED ORDER — ALBUMIN HUMAN 5 % IV SOLN: 25.0000 g | Freq: Once | INTRAVENOUS | Status: AC

## 2020-05-20 MED ORDER — AMIODARONE HCL IN DEXTROSE 360-4.14 MG/200ML-% IV SOLN
30.0000 mg/h | INTRAVENOUS | Status: DC
  Administered 2020-05-20 (×4): 60 mg/h via INTRAVENOUS
  Administered 2020-05-21 – 2020-05-24 (×7): 30 mg/h via INTRAVENOUS
  Filled 2020-05-20 (×13): qty 200

## 2020-05-20 MED ORDER — CLOPIDOGREL BISULFATE 300 MG PO TABS
300.0000 mg | ORAL_TABLET | Freq: Once | ORAL | Status: AC
  Administered 2020-05-20: 300 mg via ORAL
  Filled 2020-05-20: qty 1

## 2020-05-20 MED ORDER — DOBUTAMINE IN D5W 4-5 MG/ML-% IV SOLN
5.0000 ug/kg/min | INTRAVENOUS | Status: DC
  Administered 2020-05-20 (×2): 20 ug/kg/min via INTRAVENOUS
  Administered 2020-05-21 (×2): 10 ug/kg/min via INTRAVENOUS
  Administered 2020-05-21: 20 ug/kg/min via INTRAVENOUS
  Filled 2020-05-20 (×4): qty 250

## 2020-05-20 MED ORDER — SODIUM CHLORIDE 0.9% FLUSH: 10.0000 mL | INTRAVENOUS | Status: DC | PRN

## 2020-05-20 MED ORDER — SODIUM CHLORIDE 0.9% FLUSH
3.0000 mL | Freq: Two times a day (BID) | INTRAVENOUS | Status: DC
  Administered 2020-05-20 – 2020-05-27 (×10): 3 mL via INTRAVENOUS

## 2020-05-20 MED ORDER — ASPIRIN 81 MG PO CHEW: 81.0000 mg | CHEWABLE_TABLET | Freq: Every day | ORAL | Status: DC

## 2020-05-20 MED ORDER — SODIUM CHLORIDE 0.9 % IV SOLN
1.2500 ng/kg/min | INTRAVENOUS | Status: DC
  Administered 2020-05-20: 5 ng/kg/min via INTRAVENOUS
  Filled 2020-05-20: qty 1

## 2020-05-20 MED ORDER — EPINEPHRINE (ANAPHYLAXIS) 30 MG/30ML IJ SOLN
0.5000 ug/min | INTRAVENOUS | Status: DC
  Administered 2020-05-20 (×2): 20 ug/min via INTRAVENOUS
  Administered 2020-05-21: 15 ug/min via INTRAVENOUS
  Administered 2020-05-21: 10 ug/min via INTRAVENOUS
  Administered 2020-05-21: 15 ug/min via INTRAVENOUS
  Filled 2020-05-20 (×8): qty 8

## 2020-05-20 MED ORDER — EPINEPHRINE HCL 5 MG/250ML IV SOLN IN NS
INTRAVENOUS | Status: AC
  Administered 2020-05-20: 20 ug/min via INTRAVENOUS
  Filled 2020-05-20: qty 250

## 2020-05-20 MED ORDER — ASPIRIN 81 MG PO CHEW
81.0000 mg | CHEWABLE_TABLET | Freq: Every day | ORAL | Status: DC
  Administered 2020-05-21 – 2020-05-24 (×4): 81 mg
  Filled 2020-05-20 (×4): qty 1

## 2020-05-20 MED ORDER — PERFLUTREN LIPID MICROSPHERE
1.0000 mL | INTRAVENOUS | Status: AC | PRN
  Administered 2020-05-20: 4 mL via INTRAVENOUS
  Filled 2020-05-20: qty 10

## 2020-05-20 MED ORDER — MAGNESIUM SULFATE 4 GM/100ML IV SOLN
4.0000 g | Freq: Once | INTRAVENOUS | Status: AC
  Administered 2020-05-20: 4 g via INTRAVENOUS

## 2020-05-20 MED ORDER — SODIUM CHLORIDE 0.9 % IV SOLN: INTRAVENOUS | Status: AC

## 2020-05-20 MED ORDER — POTASSIUM CHLORIDE 10 MEQ/50ML IV SOLN
10.0000 meq | INTRAVENOUS | Status: AC
  Administered 2020-05-20 (×6): 10 meq via INTRAVENOUS
  Filled 2020-05-20 (×5): qty 50

## 2020-05-20 MED ORDER — CHLORHEXIDINE GLUCONATE CLOTH 2 % EX PADS
6.0000 | MEDICATED_PAD | Freq: Every day | CUTANEOUS | Status: DC
  Administered 2020-05-20 – 2020-05-24 (×4): 6 via TOPICAL

## 2020-05-20 MED ORDER — CALCIUM GLUCONATE-NACL 1-0.675 GM/50ML-% IV SOLN
INTRAVENOUS | Status: AC
  Filled 2020-05-20: qty 50

## 2020-05-20 MED ORDER — HYDRALAZINE HCL 20 MG/ML IJ SOLN: 10.0000 mg | INTRAMUSCULAR | Status: AC | PRN

## 2020-05-20 MED ORDER — SODIUM BICARBONATE 8.4 % IV SOLN
INTRAVENOUS | Status: AC
  Filled 2020-05-20: qty 50

## 2020-05-20 MED ORDER — EPINEPHRINE HCL 5 MG/250ML IV SOLN IN NS: 0.5000 ug/min | INTRAVENOUS | Status: AC

## 2020-05-20 MED ORDER — LABETALOL HCL 5 MG/ML IV SOLN: 10.0000 mg | INTRAVENOUS | Status: AC | PRN

## 2020-05-20 MED ORDER — SODIUM CHLORIDE 0.9 % IV SOLN
250.0000 mL | INTRAVENOUS | Status: DC | PRN
  Administered 2020-05-21: 250 mL via INTRAVENOUS

## 2020-05-20 MED ORDER — ONDANSETRON HCL 4 MG/2ML IJ SOLN
4.0000 mg | Freq: Four times a day (QID) | INTRAMUSCULAR | Status: DC | PRN
  Administered 2020-05-22 – 2020-05-28 (×9): 4 mg via INTRAVENOUS
  Filled 2020-05-20 (×9): qty 2

## 2020-05-20 MED ORDER — MAGNESIUM SULFATE 4 GM/100ML IV SOLN
INTRAVENOUS | Status: AC
  Administered 2020-05-20: 2 g via INTRAVENOUS
  Filled 2020-05-20: qty 100

## 2020-05-20 MED ORDER — MAGNESIUM SULFATE 2 GM/50ML IV SOLN
2.0000 g | Freq: Once | INTRAVENOUS | Status: AC
  Filled 2020-05-20: qty 50

## 2020-05-20 MED ORDER — METRONIDAZOLE 500 MG/100ML IV SOLN
500.0000 mg | Freq: Three times a day (TID) | INTRAVENOUS | Status: AC
  Administered 2020-05-20 – 2020-05-26 (×21): 500 mg via INTRAVENOUS
  Filled 2020-05-20 (×21): qty 100

## 2020-05-20 MED ORDER — POTASSIUM CHLORIDE 10 MEQ/50ML IV SOLN
10.0000 meq | INTRAVENOUS | Status: AC
  Administered 2020-05-20 (×6): 10 meq via INTRAVENOUS
  Filled 2020-05-20 (×4): qty 50

## 2020-05-20 MED ORDER — ALBUMIN HUMAN 25 % IV SOLN
12.5000 g | Freq: Once | INTRAVENOUS | Status: AC
  Administered 2020-05-20: 12.5 g via INTRAVENOUS

## 2020-05-20 MED ORDER — ALBUMIN HUMAN 5 % IV SOLN
INTRAVENOUS | Status: AC
  Administered 2020-05-20: 25 g via INTRAVENOUS
  Filled 2020-05-20: qty 250

## 2020-05-20 MED ORDER — CHLORHEXIDINE GLUCONATE CLOTH 2 % EX PADS
6.0000 | MEDICATED_PAD | Freq: Every day | CUTANEOUS | Status: DC
  Administered 2020-05-20 – 2020-06-01 (×11): 6 via TOPICAL

## 2020-05-20 MED ORDER — AMIODARONE IV BOLUS ONLY 150 MG/100ML
150.0000 mg | Freq: Once | INTRAVENOUS | Status: AC
  Administered 2020-05-20: 150 mg via INTRAVENOUS

## 2020-05-20 MED ORDER — AMIODARONE HCL IN DEXTROSE 360-4.14 MG/200ML-% IV SOLN: 60.0000 mg/h | INTRAVENOUS | Status: DC

## 2020-05-20 MED ORDER — SODIUM CHLORIDE 0.9% FLUSH
10.0000 mL | Freq: Two times a day (BID) | INTRAVENOUS | Status: DC
  Administered 2020-05-20: 10 mL
  Administered 2020-05-22: 30 mL
  Administered 2020-05-22 – 2020-05-26 (×7): 10 mL

## 2020-05-20 MED ORDER — PANTOPRAZOLE SODIUM 40 MG PO PACK
40.0000 mg | PACK | Freq: Every day | ORAL | Status: DC
  Administered 2020-05-20 – 2020-05-24 (×5): 40 mg
  Filled 2020-05-20 (×5): qty 20

## 2020-05-20 MED ORDER — ALBUMIN HUMAN 5 % IV SOLN
INTRAVENOUS | Status: AC
  Filled 2020-05-20: qty 250

## 2020-05-20 MED ORDER — CHLORHEXIDINE GLUCONATE 0.12% ORAL RINSE (MEDLINE KIT)
15.0000 mL | Freq: Two times a day (BID) | OROMUCOSAL | Status: DC
  Administered 2020-05-20 – 2020-06-01 (×21): 15 mL via OROMUCOSAL

## 2020-05-20 MED ORDER — HEPARIN BOLUS VIA INFUSION
3000.0000 [IU] | Freq: Once | INTRAVENOUS | Status: AC
  Administered 2020-05-20: 3000 [IU] via INTRAVENOUS
  Filled 2020-05-20: qty 3000

## 2020-05-20 MED ORDER — ATORVASTATIN CALCIUM 80 MG PO TABS: 80.0000 mg | ORAL_TABLET | Freq: Every day | ORAL | Status: DC

## 2020-05-20 MED ORDER — CALCIUM GLUCONATE-NACL 2-0.675 GM/100ML-% IV SOLN
2.0000 g | Freq: Once | INTRAVENOUS | Status: AC
  Administered 2020-05-20: 2000 mg via INTRAVENOUS
  Filled 2020-05-20: qty 100

## 2020-05-20 MED ORDER — ACETAMINOPHEN 325 MG PO TABS: 650.0000 mg | ORAL_TABLET | ORAL | Status: DC | PRN

## 2020-05-20 MED ORDER — SODIUM CHLORIDE 0.9 % IV SOLN
1.2500 ng/kg/min | INTRAVENOUS | Status: DC
  Filled 2020-05-20: qty 1

## 2020-05-20 MED ORDER — SODIUM BICARBONATE 8.4 % IV SOLN
100.0000 meq | Freq: Once | INTRAVENOUS | Status: AC
  Administered 2020-05-20: 100 meq via INTRAVENOUS

## 2020-05-20 MED ORDER — POTASSIUM CHLORIDE 20 MEQ PO PACK: 40.0000 meq | PACK | Freq: Once | ORAL | Status: DC

## 2020-05-20 MED ORDER — ORAL CARE MOUTH RINSE
15.0000 mL | OROMUCOSAL | Status: DC
  Administered 2020-05-20 – 2020-05-24 (×38): 15 mL via OROMUCOSAL

## 2020-05-20 MED ORDER — NOREPINEPHRINE 16 MG/250ML-% IV SOLN
0.0000 ug/min | INTRAVENOUS | Status: DC
  Administered 2020-05-20 (×2): 75 ug/min via INTRAVENOUS
  Administered 2020-05-20: 72 ug/min via INTRAVENOUS
  Administered 2020-05-20 (×2): 75 ug/min via INTRAVENOUS
  Administered 2020-05-20: 21.333 ug/min via INTRAVENOUS
  Administered 2020-05-21: 47 ug/min via INTRAVENOUS
  Administered 2020-05-21: 65 ug/min via INTRAVENOUS
  Administered 2020-05-21: 44 ug/min via INTRAVENOUS
  Administered 2020-05-21: 42 ug/min via INTRAVENOUS
  Administered 2020-05-21: 44 ug/min via INTRAVENOUS
  Administered 2020-05-22: 20 ug/min via INTRAVENOUS
  Administered 2020-05-24: 2 ug/min via INTRAVENOUS
  Administered 2020-05-24: 3 ug/min via INTRAVENOUS
  Filled 2020-05-20 (×14): qty 250

## 2020-05-20 MED ORDER — HYDROCORTISONE NA SUCCINATE PF 100 MG IJ SOLR
100.0000 mg | Freq: Two times a day (BID) | INTRAMUSCULAR | Status: DC
  Administered 2020-05-20 – 2020-05-23 (×7): 100 mg via INTRAVENOUS
  Filled 2020-05-20 (×7): qty 2

## 2020-05-20 MED FILL — Heparin Sod (Porcine)-NaCl IV Soln 1000 Unit/500ML-0.9%: INTRAVENOUS | Qty: 500 | Status: AC

## 2020-05-20 NOTE — Procedures (Signed)
Arterial Catheter Insertion Procedure Note  [name]  [id number]  [birth year]  Date:05/20/20  Time:2:51 AM    Provider Performing: Kipp Brood    Procedure: Insertion of Arterial Line [phone]) with US guidance (01655)   Indication(s) Blood pressure monitoring and/or need for frequent ABGs  Consent Unable to obtain consent due to emergent nature of procedure.  Anesthesia None   Time Out Verified patient identification, verified procedure, site/side was marked, verified correct patient position, special equipment/implants available, medications/allergies/relevant history reviewed, required imaging and test results available.   Sterile Technique Maximal sterile technique including full sterile barrier drape, hand hygiene, sterile gown, sterile gloves, mask, hair covering, sterile ultrasound probe cover (if used).   Procedure Description Area of catheter insertion was cleaned with chlorhexidine and draped in sterile fashion. With real-time ultrasound guidance an arterial catheter was placed into the left radial artery.  Appropriate arterial tracings confirmed on monitor.     Complications/Tolerance None; patient tolerated the procedure well.   EBL Minimal   Specimen(s) None  Kipp Brood, MD [facility] ICU Physician [place]  Pager: [phone] Or Epic Secure Chat After hours: [phone].  05/20/2020, 2:51 AM

## 2020-05-20 NOTE — Progress Notes (Signed)
eLink Physician-Brief Progress Note Patient Name: [name] DOB: Apr 06, 1955 MRN: [id number]   Date of Service  05/20/2020  HPI/Events of Note  Serum magnesium 1.2  eICU Interventions  Magnesium sulfate 6 gm iv bolus ordered per electrolyte replacement protocol.        Frederik Pear 05/20/2020, 8:36 PM

## 2020-05-20 NOTE — Procedures (Signed)
Central Venous Catheter Insertion Procedure Note  BOB DAVERSA  [id number]  1955/07/01  Date:05/20/20  Time:2:49 AM   Provider Performing:Buzz Axel   Procedure: Insertion of Non-tunneled Central Venous Catheter(36556) with US guidance (00867)   Indication(s) Medication administration  Consent Unable to obtain consent due to emergent nature of procedure.  Anesthesia Topical only with 1% lidocaine   Timeout Verified patient identification, verified procedure, site/side was marked, verified correct patient position, special equipment/implants available, medications/allergies/relevant history reviewed, required imaging and test results available.  Sterile Technique Maximal sterile technique including full sterile barrier drape, hand hygiene, sterile gown, sterile gloves, mask, hair covering, sterile ultrasound probe cover (if used).  Procedure Description Area of catheter insertion was cleaned with chlorhexidine and draped in sterile fashion.  With real-time ultrasound guidance a central venous catheter was placed into the left femoral vein. Nonpulsatile blood flow and easy flushing noted in all ports.  The catheter was sutured in place and sterile dressing applied.  Complications/Tolerance None; patient tolerated the procedure well. Chest X-ray is ordered to verify placement for internal jugular or subclavian cannulation.   Chest x-ray is not ordered for femoral cannulation.  EBL Minimal  Specimen(s) None  Kipp Brood, MD [facility] ICU Physician [place]  Pager: [phone] Or Epic Secure Chat After hours: [phone].  05/20/2020, 2:49 AM

## 2020-05-20 NOTE — Progress Notes (Addendum)
NAME:  [name], MRN:  [id number], DOB:  Sep 07, 1955, LOS: 1 ADMISSION DATE:  05/19/2020, CONSULTATION DATE: 05/19/2020 REFERRING MD: Dina Rich - ED Zacarias Pontes, CHIEF COMPLAINT: Cardiogenic shock  History of Present Illness:  65 year old man with PMHx significant for HTN, HLD, T2DM, GERD, chronic pain, RBBB and first degree AV block who presented with chest pain at [facility].  OOH VF arrest with immediate bystander CPR and EMS activation with > 45 minutes total CPR. Post arrest, felt to have inferior wall STEMI on EKG. Intubated, sedated and transferred to [facility] for potential PCI.  On arrival to [facility], EKG no longer concerning for inferior wall STEMI.  At that time, patient was awake and following commands on sedation. Initial plan to admit patient as NSTEMI and proceed to angiography in the morning following medical optimization.  Overnight 5/17, patient became hypotensive to SBP 80s with emesis x 1. Repeat EKG demonstrated marked ST elevation in the inferior leads. Patient was taken to cath lab where mid-RCA lesion (total occlusion) was noted but not stented (suspect chronic lesion due to adequate collateral flow). Patient returned to CVICU. Heparin and DAPT initiated for ACS. Remained hypotensive requiring initiation of multiple vasopressors for management of distributive shock.  Pertinent  Medical History   Past Medical History:  Diagnosis Date  . Adenomatous polyp   . Cataract    left eye for sure   . Chronic pain   . Diabetes mellitus   . GERD (gastroesophageal reflux disease)   . History of colonic polyps   . History of hip replacement, total   . Hx of anaphylactic shock   . Hyperlipidemia   . Hypertension   . Left anterior hemiblock   . Obesity   . RBBB (right bundle branch block with left anterior fascicular block)   . 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 5/17 Admitted to [facility] as transfer from [place]. S/p VF arrest with 45 mins CPR before ROSC. Left femoral line inserted. C/f inferior STEMI. Coronary angiography with PCI RCA lesion, not stented, thought to be chronic . 5/18 Remains hypotensive on multiple pressors. LA and troponins downtrending. Weaning Giapreza. SVR and CO remain suboptimal  Interim History / Subjective:  Admitted overnight (see HPI for details) Opens eyes to voice and nods appropriately to questions Endorses generalized pain  Objective   Blood pressure (!) 108/56, pulse (!) 121, temperature 99 F (37.2 C), temperature source Axillary, resp. rate (!) 21, height 5\' 11"  (1.803 m), weight (!) 140.5 kg, SpO2 98 %. CVP:  [2 mmHg-19 mmHg] 9 mmHg  Vent Mode: PRVC FiO2 (%):  [40 %-100 %] 80 % Set Rate:  [16 bmp-20 bmp] 16 bmp Vt Set:  [600 mL] 600 mL PEEP:  [5 cmH20-10 cmH20] 10 cmH20 Plateau Pressure:  [24 cmH20] 24 cmH20   Intake/Output Summary (Last 24 hours) at 05/20/2020 0721 Last data filed at 05/20/2020 0700 Gross per 24 hour  Intake 3566.48 ml  Output 1610 ml  Net 1956.48 ml   Filed Weights   05/19/20 1900 05/20/20 0530  Weight: 115 kg (!) 140.5 kg   Physical Examination: General: Critically ill-appearing middle-aged man in NAD. Appears mildly uncomfortable. HEENT: [place]/AT, anicteric sclera, PERRL, moist mucous membranes. ETT in place. Neuro: Sedated. Responds to verbal stimuli. Following commands consistently. Moves all 4 extremities spontaneously. +Corneal, +Cough and +Gag  CV:  RRR, no murmur. PULM: Breathing even and unlabored on vent (PEEP 10, FiO2 70%). Lung fields with coarse rhonchi bilaterally. GI: Soft, nontender, mildly distended. Hypoactive bowel sounds. Extremities: Bilateral symmetric LE edema noted. +DP pulses bilaterally, Skin: Warm/dry, no rashes.  Labs/imaging that I have personally reviewed: (right click and "Reselect all SmartList Selections" daily)    Resolved  Hospital Problem list   None  Assessment & Plan:  Acute hypoxic respiratory failure secondary to acute pulmonary edema requiring mechanical ventilation Concern for aspiration PNA - Continue full vent support (4-8cc/kg IBW) - Wean FiO2 for O2 sat > 90% - Daily WUA/SBT - VAP bundle - Pulmonary hygiene - PAD protocol for sedation: Fentanyl and Versed for goal RASS 0 to -1  - Continue ceftriaxone for aspiration coverage - Will likely require diuresis prior to any effort to extubate  Cardiogenic shock secondary to acute coronary syndrome likely involving the inferior wall requiring titration of vasoactive infusions VF cardiac arrest Echo LVEF 40-45%. S/p cath lab where mid-RCA lesion (total occlusion) was noted but not stented, suspected to be chronic due to well-developed collaterals. - Cardiology/HF following, appreciate assistance - Titrate Epi, NE for goal MAP > 65 - Continue SDS - Follow Co-ox - Continue vasopressin - Continue dobutamine for RV support - Wean Giapreza as able - Amiodarone gtt for prevention of further VT - Continue heparin gtt - DAPT per Cardiology  Distributive shock with concern for gut ischemia (in the setting of profound hypotension) vs. intrabdominal infection - Continue to monitor abdominal exam - Trend WBC, LA - Continue empiric ceftriaxone, metronidazole - Low threshold for abdominal imaging  Uncontrolled type 2 diabetes - Insulin gtt  Hypertension - Hold home antihypertensives in the setting of shock/hypotension  Best practice (right click and "Reselect all SmartList Selections" daily)  Diet:  NPO Pain/Anxiety/Delirium protocol (if indicated): Yes (RASS goal -1) - Versed and Fentanyl infusions VAP protocol (if indicated): Yes DVT prophylaxis: Systemic AC GI prophylaxis: PPI Glucose control:  Insulin gtt Central venous access:  Yes, and it is still needed Arterial line:  Yes, and it is still needed Foley:  Yes, and it is still  needed Mobility:  bed rest  PT consulted: N/A Last date of multidisciplinary goals of care discussion [wife updated at bedside 5/17, sister-in-law briefly updated 5/18] Code Status:  DNR Disposition: ICU  Critical care time: 55 minutes   Lestine Mount, PA-C [facility] 05/20/20 3:10 PM  Please see [email or web address] for pager details.  From 7A-7P if no response, please call [phone] After hours, please call ELink [phone]

## 2020-05-20 NOTE — Progress Notes (Signed)
[place] Progress Note Patient Name: [name] DOB: 09/28/1955 MRN: [id number]   Date of Service  05/20/2020  HPI/Events of Note  K+ 2.7.  eICU Interventions  K+ replacement is ongoing,        Frederik Pear 05/20/2020, 2:33 AM

## 2020-05-20 NOTE — Progress Notes (Signed)
Critical care attending note:  65 year old man in shock following cardiac arrest.   CTO of RCA on cath, no intervention.  Modest increase in troponin relative to degree of shock.  Suspect patient had arrhythmic event related to prior infarction.  Shock therefore likely related to prolonged hypotension and tissue ischemia.  Patient remains mottled with cold extremities.  Flowtrack indicates cardiac output of 6 L with low SVR.  Fick calculation cardiac output of 7 L with SVR calculated at under 400.  This is consistent with vasodilatory shock following cardiac arrest possibly from ischemic gut.  Blood pressure continues to be marginal despite vasopressin, epinephrine, norepinephrine and dobutamine at high doses.  I spoke to the patient's family and indicated that we have reached a maximum of potentially useful interventions.  I was frank with him and explaining that I believe that his condition would continue to decline.  We have agreed not to perform CPR in the event of cardiac arrest prioritize patient comfort.  CRITICAL CARE Performed by: Kipp Brood   Total critical care time: 60 minutes  Critical care time was exclusive of separately billable procedures and treating other patients.  Critical care was necessary to treat or prevent imminent or life-threatening deterioration.  Critical care was time spent personally by me on the following activities: development of treatment plan with patient and/or surrogate as well as nursing, discussions with consultants, evaluation of patient's response to treatment, examination of patient, obtaining history from patient or surrogate, ordering and performing treatments and interventions, ordering and review of laboratory studies, ordering and review of radiographic studies, pulse oximetry, re-evaluation of patient's condition and participation in multidisciplinary rounds.  Kipp Brood, MD [facility] ICU Physician [place]  Pager:  [phone] Mobile: [phone] After hours: [phone].  05/20/2020, 2:41 AM

## 2020-05-20 NOTE — Progress Notes (Signed)
Brief episode of VFIB with loss of pulse.  Spoke with Arville Go (wife) and let her know about episode and requested she return to the hospital.  Dr. Carlis Abbott aware.

## 2020-05-20 NOTE — Progress Notes (Signed)
  Echocardiogram 2D Echocardiogram has been performed.  [name] 05/20/2020, 10:47 AM

## 2020-05-20 NOTE — Progress Notes (Signed)
eLink Physician-Brief Progress Note Patient Name: [name] DOB: 12/18/1955 MRN: [id number]   Date of Service  05/20/2020  HPI/Events of Note  Patient with profound hypotension concerning for evolving shock.  eICU Interventions  Two amps of sodium bicarbonate ordered iv stat, CBC, ABG, BMP, ionized calcium ordered stat, PH 7.22, hemoglobin 14 gm / dl, ionized calcium 1.03, K+ 2.7 patient is receiving K+ replacement.Cardiology paged to assist with care, ground crew updated and  Dr. Lynetta Mare came and saw the patient. I spoke with patients wife and updated her regarding his grim prognosis.        Kerry Kass Athziry Millican 05/20/2020, 1:45 AM

## 2020-05-20 NOTE — Progress Notes (Signed)
Attempted to call wife x2- both went straight to VM. I left a message letting her know I was attempting to contact her.  I was able to speak with Arville Go and we discussed his DNR code status. She understands that we are continuing all aggressive care up to the point of cardiac arrest, but if he has another arrest, we will allow him to pass naturally as he is receiving all aggressive care measures.  Julian Hy, DO 05/20/20 6:19 PM [facility]

## 2020-05-20 NOTE — Progress Notes (Signed)
ANTICOAGULATION CONSULT NOTE - Initial Consult  Pharmacy Consult for heparin Indication: ACS/cardiac arrest  Patient Measurements: Height: 5\' 11"  (180.3 cm) Weight: (!) 140.5 kg (309 lb 11.9 oz) IBW/kg (Calculated) : 75.3 Heparin Dosing Weight: 108kg  Vital Signs: Temp: 100 F (37.8 C) (05/18 1109) Temp Source: Oral (05/18 1109) BP: 114/61 (05/18 0800) Pulse Rate: 122 (05/18 0800)  Labs: Recent Labs    05/19/20 1818 05/19/20 2221 05/20/20 0042 05/20/20 0047 05/20/20 0500 05/20/20 0521 05/20/20 0527 05/20/20 0842 05/20/20 0939  HGB 18.8*  --  14.0 14.3 14.1 14.3  --   --   --   HCT 61.2*  --  44.3 42.0 44.5 42.0  --   --   --   PLT 279  --  219  --  162  --   --   --   --   LABPROT  --  18.7*  --   --  16.2*  --   --   --   --   INR  --  1.6*  --   --  1.3*  --   --   --   --   HEPARINUNFRC  --   --   --   --   --   --   --   --  0.12*  CREATININE 1.29*  --   --  1.70* 2.36*  --   --   --   --   TROPONINIHS 2,566* 9,179*  --   --   --   --  10,906* 9,166*  --     Estimated Creatinine Clearance: 44.2 mL/min (A) (by C-G formula based on SCr of 2.36 mg/dL (H)).   Assessment: 65yo male c/o CP at OSH >> suffered Vfib arrest requiring 2min of CPR, tx'd to [facility] and sent to cath lab for STEMI vs NSTEMI, now post-cath during which no intervention could be completed, to start heparin 4h after sheath removed as d/w cards.  Of note INR elevated but downtrending today (1.3) d/t elevated transaminase.   HL (subtherapeutic) 0.12 CBC Hgb 14.3, Plt 162  Goal of Therapy:  Heparin level 0.3-0.7 units/ml Monitor platelets by anticoagulation protocol: Yes   Plan:  Give heparin bolus 3000 units IV x1  Increase heparin drip to 1500 units / hr 6hr heparin level at 1800 F/u CBC, HL, s/sx bleeding, and renal function    Wilson Singer, PharmD PGY1 Pharmacy Resident 05/20/2020 11:42 AM

## 2020-05-20 NOTE — Procedures (Signed)
Central Venous Catheter Insertion Procedure Note  [name]  [id number]  Sep 09, 1955  Date:05/20/20  Time:2:50 AM   Provider Performing:Lakesia Dahle   Procedure: Insertion of Non-tunneled Central Venous Catheter(36556) without US guidance  Indication(s) Medication administration and Difficult access  Consent Unable to obtain consent due to emergent nature of procedure.  Anesthesia Topical only with 1% lidocaine   Timeout Verified patient identification, verified procedure, site/side was marked, verified correct patient position, special equipment/implants available, medications/allergies/relevant history reviewed, required imaging and test results available.  Sterile Technique Maximal sterile technique including full sterile barrier drape, hand hygiene, sterile gown, sterile gloves, mask, hair covering, sterile ultrasound probe cover (if used).  Procedure Description Area of catheter insertion was cleaned with chlorhexidine and draped in sterile fashion.  Without real-time ultrasound guidance a central venous catheter was placed into the right subclavian vein. Nonpulsatile blood flow and easy flushing noted in all ports.  The catheter was sutured in place and sterile dressing applied.  Complications/Tolerance None; patient tolerated the procedure well. Chest X-ray is ordered to verify placement for internal jugular or subclavian cannulation.   Chest x-ray is not ordered for femoral cannulation.  EBL Minimal  Specimen(s) None  Kipp Brood, MD [facility] ICU Physician [place]  Pager: [phone] Or Epic Secure Chat After hours: [phone].  05/20/2020, 2:50 AM

## 2020-05-20 NOTE — Progress Notes (Signed)
ANTICOAGULATION CONSULT NOTE - Follow Up Consult  Pharmacy Consult for IV Heparin Indication: ACS/cardiac arrest  Allergies  Allergen Reactions  . Toradol [Ketorolac Tromethamine] Anaphylaxis  . Codeine     REACTION: itch  . Morphine And Related Nausea And Vomiting  . Naproxen     REACTION: anaphylaxis    Patient Measurements: Height: 5' 10.98" (180.3 cm) Weight: (!) 140.5 kg (309 lb 11.9 oz) IBW/kg (Calculated) : 75.26 Heparin Dosing Weight: 108 kg  Vital Signs: Temp: 100.4 F (38 C) (05/18 1559) Temp Source: Oral (05/18 1559) BP: 150/77 (05/18 1800) Pulse Rate: 102 (05/18 1830)  Labs: Recent Labs    05/19/20 1818 05/19/20 2221 05/20/20 0042 05/20/20 0047 05/20/20 0500 05/20/20 0521 05/20/20 0527 05/20/20 0842 05/20/20 0939 05/20/20 1121 05/20/20 1240 05/20/20 1801  HGB 18.8*  --  14.0 14.3 14.1 14.3  --   --   --   --   --   --   HCT 61.2*  --  44.3 42.0 44.5 42.0  --   --   --   --   --   --   PLT 279  --  219  --  162  --   --   --   --   --   --   --   LABPROT  --  18.7*  --   --  16.2*  --   --   --   --   --   --   --   INR  --  1.6*  --   --  1.3*  --   --   --   --   --   --   --   HEPARINUNFRC  --   --   --   --   --   --   --   --  0.12*  --   --  0.29*  CREATININE 1.29*  --   --  1.70* 2.36*  --   --   --   --  2.82*  --   --   TROPONINIHS 2,566* 9,947*  --   --   --   --    < > 9,166*  --  8,258* 7,858*  --    < > = values in this interval not displayed.    Estimated Creatinine Clearance: 37 mL/min (A) (by C-G formula based on SCr of 2.82 mg/dL (H)).   Assessment: 65 year old male on IV Heparin for ACS.   Heparin level 0.29 on current rate of 1500 units/hr (after receiving bolus of 3000 units x1).  CBC is stable and within normal limits.  Troponin elevated and trending down.   Goal of Therapy:  Heparin level 0.3-0.7 units/ml Monitor platelets by anticoagulation protocol: Yes   Plan:  Increase Heparin to 1600 units/hr to keep in goal.   Heparin level in ~6 hours.   Sloan Leiter, PharmD, BCPS, BCCCP Clinical Pharmacist Please refer to [facility] for [place] numbers 05/20/2020,7:06 PM

## 2020-05-20 NOTE — Consult Note (Addendum)
Advanced Heart Failure Team Consult Note   Primary Physician: [facility] PCP-Cardiologist:  Sinclair Grooms, MD  Reason for Consultation: Cardiogenic Shock, Post Cardiac Vfib Arrest   HPI:    [name] is seen today for evaluation of cardiogenic shock, post cardiac Vfib arrest at the request of Dr. Tamala Julian, Cardiology.   65 y/o male w/ HTN, IDT2DM and HLD but no prior cardiac history, suffered witnessed out of hospital cardiac arrest 5/17 w/ immediate bystander CPR, initiated by wife, and prompt activation of EMS. Per report, had prolonged CPR, more than 45 min prior to ROSC. Underlying rhythm was VFib. Post arrest EKG suggested possibility of inferior MI. Portable bedside echo showed inferobasal severe hypokinesis with septal bounce.  RV systolic function normal.  LVEF 40 to 50% range.  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 No other disease. LM, LAD and LCx all widely patent. No indication for PCI.   Initial LVEDP was noted to be 15 mmHg. Following angiography and IV fluid, LVEDP 20 mmHg.  Now on high dose pressor/inotropic support for CGS. DBA 20, EPi 20, NE 75, VP 0.04 + Giapreza 20. MAPs low 60s. Amio gtt at 60/hr. Complete TTE pending.    FloTrac CO 8.3 CI 3.6 SVR 463 SV 69   Co-ox 85%. CVP 9-10   Sinus tach 120s w/ runs of NSVT on tele  Pertinent Labs  K 2.5  Mg 2.8  CO2 20 Lactic Acid 8.9  Hs Trop peaked 10,906  SCr 1.29>>1.70 AST 492 ALT 410  WBC 23>>16K  LDL 60  Glucose 318      Review of Systems: [y] = yes, [ ]  = no   . General: Weight gain [ ] ; Weight loss [ ] ; Anorexia [ ] ; Fatigue [ ] ; Fever [ ] ; Chills [ ] ; Weakness [ ]   . Cardiac: Chest pain/pressure [ ] ; Resting SOB [ ] ; Exertional SOB [ ] ; Orthopnea [ ] ; Pedal Edema [ ] ; Palpitations [ ] ; Syncope [Y ]; Presyncope [ ] ;  Paroxysmal nocturnal dyspnea[ ]   . Pulmonary: Cough [ ] ; Wheezing[ ] ; Hemoptysis[ ] ; Sputum [ ] ; Snoring [ ]   . GI: Vomiting[ ] ; Dysphagia[ ] ; Melena[ ] ; Hematochezia [ ] ; Heartburn[ ] ; Abdominal pain [ ] ; Constipation [ ] ; Diarrhea [ ] ; BRBPR [ ]   . GU: Hematuria[ ] ; Dysuria [ ] ; Nocturia[ ]   . Vascular: Pain in legs with walking [ ] ; Pain in feet with lying flat [ ] ; Non-healing sores [ ] ; Stroke [ ] ; TIA [ ] ; Slurred speech [ ] ;  . Neuro: Headaches[ ] ; Vertigo[ ] ; Seizures[ ] ; Paresthesias[ ] ;Blurred vision [ ] ; Diplopia [ ] ; Vision changes [ ]   . Ortho/Skin: Arthritis [ ] ; Joint pain [ ] ; Muscle pain [ ] ; Joint swelling [ ] ; Back Pain [ ] ; Rash [ ]   . Psych: Depression[ ] ; Anxiety[ ]   . Heme: Bleeding problems [ ] ; Clotting disorders [ ] ; Anemia [ ]   . Endocrine: Diabetes [ ] ; Thyroid dysfunction[ ]   Home Medications Prior to Admission medications   Medication Sig Start Date End Date Taking? Authorizing Provider  amoxicillin-clavulanate (AUGMENTIN) 875-125 MG tablet TAKE ONE TABLET BY MOUTH ONE HOUR PRIOR TO DENTAL PROCEDURE THEN TAKE EVERY 12 HOURS FOR 2 DOSES 07/14/17   Jessy Oto, MD  atorvastatin (LIPITOR) 40 MG tablet Take 1 tablet (40 mg total) by mouth  daily. 05/04/16   Marletta Lor, MD  Blood Glucose Monitoring Suppl (ONE TOUCH ULTRA SYSTEM KIT) W/DEVICE KIT 1 kit by Does not apply route once. 09/29/14   Marletta Lor, MD  diclofenac sodium (VOLTAREN) 1 % GEL Apply topically.      [provider]  fexofenadine (ALLEGRA) 180 MG tablet Take 1 tablet (180 mg total) by mouth daily. 10/27/15   Marletta Lor, MD  HYDROcodone-acetaminophen (NORCO) 7.5-325 MG tablet Take 1-2 tablets by mouth every 4 (four) hours as needed. 02/17/16   Jessy Oto, MD  Insulin Detemir (LEVEMIR FLEXTOUCH) 100 UNIT/ML Pen 100 units at bedtime 05/25/16   Marletta Lor, MD  Insulin Lispro (HUMALOG KWIKPEN) 200 UNIT/ML SOPN Inject 30 Units into the skin 3 (three) times daily  with meals. 05/25/16   Marletta Lor, MD  Insulin Pen Needle (NOVOTWIST) 32G X 5 MM MISC USE TO INJECT INSULIN THREE TIMES A DAY 12/23/14   Marletta Lor, MD  lidocaine (LIDODERM) 5 % Place 1 patch onto the skin as needed.  01/30/12   [provider]  lisinopril-hydrochlorothiazide (PRINZIDE,ZESTORETIC) 20-25 MG tablet Take 1 tablet by mouth daily. 05/04/16   Marletta Lor, MD  metFORMIN (GLUCOPHAGE) 1000 MG tablet Take 1 tablet (1,000 mg total) by mouth 2 (two) times daily with a meal. 05/04/16   Marletta Lor, MD  Multiple Vitamins-Minerals (MULTIVITAMIN WITH MINERALS) tablet Take 1 tablet by mouth daily.    [provider]  Nystatin (Mora) 100000 UNIT/GM POWD Apply 1 Bottle topically as needed.  04/03/14   [provider]  omeprazole (PRILOSEC) 20 MG capsule TAKE 1 CAPSULE BY MOUTH  DAILY 11/25/15   Marletta Lor, MD  ONE [facility] ULTRA TEST test strip Check blood sugar daily and as needed 06/29/15   Marletta Lor, MD    Past Medical History: Past Medical History:  Diagnosis Date  . Adenomatous polyp   . Cataract    left eye for sure   . Chronic pain   . Diabetes mellitus   . GERD (gastroesophageal reflux disease)   . History of colonic polyps   . History of hip replacement, total   . Hx of anaphylactic shock   . Hyperlipidemia   . Hypertension   . Left anterior hemiblock   . Obesity   . RBBB (right bundle branch block with left anterior fascicular block)   . 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 APPENDECTOMY    . CERVICAL FUSION    . CERVICAL LAMINECTOMY    . COLONOSCOPY    . CORONARY/GRAFT ACUTE MI REVASCULARIZATION N/A 05/19/2020   Procedure: Coronary/Graft Acute MI Revascularization;  Surgeon: Belva Crome, MD;  Location: [facility] [facility];  Service: Cardiovascular;  Laterality: N/A;  . HAND SURGERY    . HARDWARE  REMOVAL Left 09/10/2012   Procedure: HARDWARE REMOVAL;  Surgeon: Jessy Oto, MD;  Location: [facility];  Service: Orthopedics;  Laterality: Left;  . HIP SURGERY  12/29/09   R THR  (Nit[place])  . HIP SURGERY     bilateral  . LEFT HEART CATH AND CORONARY ANGIOGRAPHY N/A 05/19/2020   Procedure: LEFT HEART CATH AND CORONARY ANGIOGRAPHY;  Surgeon: Belva Crome, MD;  Location: [place] [facility];  Service: Cardiovascular;  Laterality: N/A;  . TOTAL HIP ARTHROPLASTY Left 09/10/2012   Procedure: REMOVAL OF HARDWARE LEFT  PROXIMAL THIGH/FEMUR AND LEFT TOTAL HIP ARTHROPLASTY;  Surgeon: Jessy Oto, MD;  Location: [facility];  Service: Orthopedics;  Laterality: Left;  . TOTAL KNEE ARTHROPLASTY  01/2008   left knee    Family History: Family History  Problem Relation Age of Onset  . Cancer Mother        lung  . Crohn's disease Father   . Alcohol abuse Brother   . Colon polyps Brother   . Colon cancer Neg Hx     Social History: Social History   Socioeconomic History  . Marital status: Married    Spouse name: Not on file  . Number of children: Not on file  . Years of education: Not on file  . Highest education level: Not on file  Occupational History  . Not on file  Tobacco Use  . Smoking status: Former Smoker    Packs/day: 1.50    Years: 42.00    Pack years: 63.00    Types: Cigarettes    Quit date: 09/03/2013    Years since quitting: 6.7  . Smokeless tobacco: Former Systems developer    Types: Chew  . Tobacco comment: We discussed the need to remain smoke free and to call me for help if he has the desire to smoke again.  Substance and Sexual Activity  . Alcohol use: No    Alcohol/week: 0.0 standard drinks    Comment: recovering alcoholic   . Drug use: No  . Sexual activity: Not on file  Other Topics Concern  . Not on file  Social History Narrative   Recovering alcoholic      Minister    Social Determinants of Health   Financial Resource Strain: Not on file  Food Insecurity: Not on file   Transportation Needs: Not on file  Physical Activity: Not on file  Stress: Not on file  Social Connections: Not on file    Allergies:  Allergies  Allergen Reactions  . Toradol [Ketorolac Tromethamine] Anaphylaxis  . Codeine     REACTION: itch  . Morphine And Related Nausea And Vomiting  . Naproxen     REACTION: anaphylaxis    Objective:    Vital Signs:   Temp:  [97 F (36.1 C)-100.3 F (37.9 C)] 100.3 F (37.9 C) (05/18 0400) Pulse Rate:  [81-133] 121 (05/18 0730) Resp:  [13-41] 24 (05/18 0730) BP: (35-178)/(17-149) 131/62 (05/18 0700) SpO2:  [91 %-100 %] 98 % (05/18 0730) Arterial Line BP: (50-112)/(32-64) 97/50 (05/18 0730) FiO2 (%):  [40 %-100 %] 80 % (05/18 0316) Weight:  [115 kg-140.5 kg] 140.5 kg (05/18 0530) Last BM Date: 05/20/20  Weight change: Filed Weights   05/19/20 1900 05/20/20 0530  Weight: 115 kg (!) 140.5 kg    Intake/Output:   Intake/Output Summary (Last 24 hours) at 05/20/2020 2820 Last data filed at 05/20/2020 0700 Gross per 24 hour  Intake 3566.48 ml  Output 1610 ml  Net 1956.48 ml      Physical Exam    General:  Critically ill WM Intubated and sedated, on multiple drips HEENT: +ETT  Neck: supple. JVP 10 cm . Carotids 2+ bilat; no bruits. No lymphadenopathy or thyromegaly appreciated. Cor: PMI nondisplaced. Regular rhythm, tachy rate. No rubs, gallops or murmurs. Lungs: intubated and clear Abdomen: obese, soft, nontender, nondistended. No hepatosplenomegaly. No bruits or masses. Good bowel sounds. Extremities: no cyanosis, clubbing, rash, 1+ bilateral LEE, legs warm  Neuro: intubated and sedated   Telemetry   Sinus tach 120s, brief runs of NSVT  EKG    Sinus tach 120 bpm, old RBBB  Labs   Basic Metabolic Panel: Recent Labs  Lab 05/19/20 1818 05/19/20 1832 05/20/20 0047 05/20/20 0500 05/20/20 0521  NA 139  --  147* 140 144  K 4.3  --  2.7* 2.6* 2.5*  CL 102  --  100 104  --   CO2 16*  --   --  20*  --   GLUCOSE  348*  --  366* 318*  --   BUN 19  --  26* 27*  --   CREATININE 1.29*  --  1.70* 2.36*  --   CALCIUM 9.9  --   --  7.8*  --   MG  --  2.8*  --   --   --     Liver Function Tests: Recent Labs  Lab 05/19/20 1818  AST 492*  ALT 410*  ALKPHOS 139*  BILITOT 1.7*  PROT RESULTS UNAVAILABLE DUE TO INTERFERING SUBSTANCE  ALBUMIN 3.4*   No results for input(s): LIPASE, AMYLASE in the last 168 hours. No results for input(s): AMMONIA in the last 168 hours.  CBC: Recent Labs  Lab 05/19/20 1818 05/20/20 0042 05/20/20 0047 05/20/20 0500 05/20/20 0521  WBC 18.1* 23.4*  --  16.6*  --   NEUTROABS 12.2*  --   --   --   --   HGB 18.8* 14.0 14.3 14.1 14.3  HCT 61.2* 44.3 42.0 44.5 42.0  MCV 95.5 92.3  --  91.6  --   PLT 279 219  --  162  --     Cardiac Enzymes: No results for input(s): CKTOTAL, CKMB, CKMBINDEX, TROPONINI in the last 168 hours.  BNP: BNP (last 3 results) No results for input(s): BNP in the last 8760 hours.  ProBNP (last 3 results) No results for input(s): PROBNP in the last 8760 hours.   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  Widely patent left main  Widely patent LAD  Widely patent circumflex  LVEDP 14 initially and following angiography and IV fluid 20 mmHg RECOMMENDATIONS:  IV dobutamine to improve potential RV dysfunction.  IV fluids as tolerated  A central line will be placed in the unit to measure co-oximetry and get right heart filling pressures.  Continue Levophed, epinephrine, IV fentanyl, and intermittent  Versed as needed.  Wean Levophed and epinephrine as tolerated.  Advanced heart failure team will help manage.  Critical care team, Dr. Doyne Keel managing primarily.  DG CHEST PORT 1 VIEW  Result Date: 05/19/2020 CLINICAL DATA:  Check central line placement EXAM: PORTABLE CHEST 1 VIEW COMPARISON:  Film from earlier in the same day. FINDINGS: Endotracheal tube is noted in satisfactory position. Gastric catheter is noted coiled within the stomach. Right subclavian central line is noted with catheter tip in the mid superior vena cava. No pneumothorax is noted. Persistent airspace opacities are noted primarily in the upper lobes right worse than left. No other focal abnormality is noted. IMPRESSION: Stable airspace opacities. Tubes and lines as described above without complicating factors. Electronically Signed  By: Inez Catalina M.D.   On: 05/19/2020 23:44   DG Chest Port 1 View  Result Date: 05/19/2020 CLINICAL DATA:  Check endotracheal tube placement, STEMI EXAM: PORTABLE CHEST 1 VIEW COMPARISON:  Film from earlier in the same day. FINDINGS: Cardiac shadow is stable. Diffuse airspace opacity is noted bilaterally but slightly improved when compared with the prior exam likely representing edema given the clinical history. Endotracheal tube is again noted 7.9 cm above the carina. No other focal abnormality is noted. IMPRESSION: Endotracheal tube as described. Slight improved aeration is noted. Electronically Signed   By: Inez Catalina M.D.   On: 05/19/2020 18:58      Medications:     Current Medications: . aspirin  324 mg Oral NOW   Or  . aspirin  300 mg Rectal NOW  . aspirin EC  81 mg Oral Daily  . atorvastatin  80 mg Per Tube Daily  . chlorhexidine gluconate (MEDLINE KIT)  15 mL Mouth Rinse BID  . Chlorhexidine Gluconate Cloth  6 each Topical Daily  . Chlorhexidine Gluconate Cloth  6 each Topical Daily  . clopidogrel  300 mg Per Tube Once  . clopidogrel  75 mg Per Tube Q breakfast  . docusate   100 mg Per Tube BID  . fentaNYL (SUBLIMAZE) injection  50 mcg Intravenous Once  . hydrocortisone sod succinate (SOLU-CORTEF) inj  100 mg Intravenous Q12H  . mouth rinse  15 mL Mouth Rinse 10 times per day  . polyethylene glycol  17 g Per Tube Daily  . sodium bicarbonate  100 mEq Intravenous Once  . sodium chloride flush  10-40 mL Intracatheter Q12H  . sodium chloride flush  3 mL Intravenous Q12H     Infusions: . sodium chloride    . amiodarone    . amiodarone 60 mg/hr (05/20/20 0734)  . angiotensin II (GIAPREZA) infusion 20 ng/kg/min (05/20/20 0700)  . cefTRIAXone (ROCEPHIN)  IV Stopped (05/20/20 0230)  . DOBUTamine 20 mcg/kg/min (05/20/20 0700)  . epinephrine 20 mcg/min (05/20/20 0700)  . fentaNYL infusion INTRAVENOUS 200 mcg/hr (05/20/20 0700)  . heparin 1,200 Units/hr (05/20/20 0700)  . insulin 30 mL/hr at 05/20/20 0700  . metronidazole Stopped (05/20/20 3875)  . midazolam 3 mg/hr (05/20/20 0700)  . norepinephrine (LEVOPHED) Adult infusion 75 mcg/min (05/20/20 0700)  . potassium chloride 50 mL/hr at 05/20/20 0700  .  sodium bicarbonate (isotonic) infusion in sterile water 125 mL/hr at 05/20/20 0700  . vasopressin 0.04 Units/min (05/20/20 0700)      Assessment/Plan   1. OOH VFib Arrest - 2/2 inferior MI - prolonged CPR, >45 min prior to ROSC - post arrest care per PCCM - keep K > 4.0 and Mg > 2.0  - continue amio gtt, 60/hr   2. Inferior MI/CAD  - Emergent cath showed severe single vessel disease w/ 100% CTO of the mid RCA w/ well formed L>R collaterals. No PCI. LM, LAD and LCx widely patent  - Hs trop 2,566>>9,947>>10,906 - ASA + Plavix per tube - Atorva 80 per tube. LDL goal< 70   3. Mixed Shock  - primarily cardiogenic w/ likely component of septic shock 2/2 suspected aspiration PNA  - pre-cath bedside echo LVEF ~40-50%, RV ok. Compete Echo pending  - on max dose pressor/inotropic support. DBA 20, EPi 20, NE 75, Giapreza 20 + VP 0.04 - co-ox 85%. CI 3.6 on  FloTrac. SVR low 463 ? Accuracy  - continue hemodynamic support and empiric abx per PCCM  - keep MAP >  65  - follow lactic acid for clearance. On bicarb gtt   4. Acute Hypoxic Respiratory Failure - vent management per PCCM   5. Hypokalemia - K 2.5. Mg 2.9 on admit - aggressive K supplementation  - Keep K > 4.0 and Mg > 2.0 in setting of recent VFib   6. IDDM - insulin gtt    Length of Stay: 1  Brittainy Simmons, PA-C  05/20/2020, 8:22 AM  Advanced Heart Failure Team Pager [phone] (M-F; 7a - 5p)  Please contact [facility] for night-coverage after hours (4p -7a ) and weekends on [email or web address]  Patient seen with PA, agree with the above note.   Patient had witnessed VF arrest at home, CPR 45-50 minutes to ROSC.  In ER was noted to awaken, went to cath lab.  Found to have chronic occlusion of mRCA with good collaterals.  No intervention.    Bedside echo this morning, I reviewed: EF 45-50% with basal inferolateral and inferior akinesis, D-shaped septum with mild RV dilation and normal RV systolic function.   Progressive hypotension with low SVR and preserved cardiac output.  Currently on dobutamine 20, Giapreza, vasopressin 0.04, epinephrine 30, NE 75, hydrocortison.    Lactate 8.9, on HCO3 gtt 125 cc/hr.   CXR with bilateral upper lobe infiltrates, likely aspiration.  On ceftriaxone/Flagyl.   Co-ox 86% FloTrac SVR 463, CI 3.6 CVP 11-12.   He is making urine.   General: On vent Neck: Thick, JVP difficult, no thyromegaly or thyroid nodule.  Lungs: Decreased at bases.  CV: Nondisplaced PMI.  Heart mildly tachy, regular S1/S2, no S3/S4, no murmur. 1+ ankle edema.  Abdomen: Soft, nontender, no hepatosplenomegaly, no distention.  Skin: Intact without lesions or rashes.  Neurologic: Wakes up and follows commands.  Extremities: No clubbing or cyanosis.  HEENT: Normal.   1. Shock: Primarily vasodilatory shock at this point.  SVR 463 on FloTrac with CI 3.6, co-ox 86%. Lactate  8.9 last check.  Suspect vasodilatory shock due to prolonged hypotension with arrest and tissue ischemia. Echo this morning with LV EF in the 45-50% range with mildly dilated RV but function appears relatively normal.  - Continue HCO3 gtt 125 cc/hr.  - Continue hydrocortisone.  - Currently on maximal dose of pressors => NE 75, epinephrine 20, vasopressin 0.04, Giapreza, dobutamine 20.  - Do not think mechanical support would be helpful with good cardiac index and low SVR.  2. CAD: Patient appears to have CTO of the mid RCA with good collaterals.  I do not think this was acute MI from plaque rupture.  HS-TnI significantly elevated, but think this was generalized ischemia from hypotension with prolonged cardiac arrest.  - Continue ASA/Plavx.  - Continue statin.  3. Cardiac arrest: VF arrest with immediate CPR but prolonged time to ROSC (about 45 minutes).   - amiodarone gtt.  4. AKI: Creatinine up to 2.36 but still making urine.  Suspect ATN in setting of prolonged cardiac arrest.  - If patient survives, I am concerned that he will require RRT.  5. Acute hypoxemic respiratory failure: CXR with bilateral upper lobe infiltrates likely due to aspiration with arrest.  - Vent per CCM.  - ceftriaxone/Flagyl for aspiration coverage.  6. Neuro: Patient will follow commands, appears neurologically intact.  7. Liver: Suspect patient will have shock liver, check LFTs.  8. Diabetes: Insulin.   Multisystem organ failure with guarded prognosis, currently on maximal pressors.   Loralie Champagne 05/20/2020 10:12 AM

## 2020-05-21 ENCOUNTER — Inpatient Hospital Stay (HOSPITAL_COMMUNITY): Payer: No Typology Code available for payment source

## 2020-05-21 ENCOUNTER — Encounter (HOSPITAL_COMMUNITY): Payer: Self-pay | Admitting: Interventional Cardiology

## 2020-05-21 DIAGNOSIS — J9601 Acute respiratory failure with hypoxia: Secondary | ICD-10-CM | POA: Diagnosis not present

## 2020-05-21 DIAGNOSIS — J80 Acute respiratory distress syndrome: Secondary | ICD-10-CM | POA: Diagnosis not present

## 2020-05-21 DIAGNOSIS — Z9911 Dependence on respirator [ventilator] status: Secondary | ICD-10-CM | POA: Diagnosis not present

## 2020-05-21 DIAGNOSIS — J690 Pneumonitis due to inhalation of food and vomit: Secondary | ICD-10-CM

## 2020-05-21 DIAGNOSIS — R579 Shock, unspecified: Secondary | ICD-10-CM | POA: Diagnosis not present

## 2020-05-21 DIAGNOSIS — I469 Cardiac arrest, cause unspecified: Secondary | ICD-10-CM | POA: Diagnosis not present

## 2020-05-21 LAB — BASIC METABOLIC PANEL
Anion gap: 10 (ref 5–15)
Anion gap: 7 (ref 5–15)
BUN: 30 mg/dL — ABNORMAL HIGH (ref 8–23)
BUN: 31 mg/dL — ABNORMAL HIGH (ref 8–23)
CO2: 29 mmol/L (ref 22–32)
CO2: 30 mmol/L (ref 22–32)
Calcium: 6.5 mg/dL — ABNORMAL LOW (ref 8.9–10.3)
Calcium: 6.4 mg/dL — CL (ref 8.9–10.3)
Chloride: 98 mmol/L (ref 98–111)
Chloride: 96 mmol/L — ABNORMAL LOW (ref 98–111)
Creatinine, Ser: 2.87 mg/dL — ABNORMAL HIGH (ref 0.61–1.24)
Creatinine, Ser: 2.83 mg/dL — ABNORMAL HIGH (ref 0.61–1.24)
GFR, Estimated: 23 mL/min — ABNORMAL LOW (ref >60)
GFR, Estimated: 24 mL/min — ABNORMAL LOW (ref >60)
Glucose, Bld: 140 mg/dL — ABNORMAL HIGH (ref 70–99)
Glucose, Bld: 134 mg/dL — ABNORMAL HIGH (ref 70–99)
Potassium: 3.4 mmol/L — ABNORMAL LOW (ref 3.5–5.1)
Potassium: 3.4 mmol/L — ABNORMAL LOW (ref 3.5–5.1)
Sodium: 137 mmol/L (ref 135–145)
Sodium: 133 mmol/L — ABNORMAL LOW (ref 135–145)

## 2020-05-21 LAB — POCT I-STAT 7, (LYTES, BLD GAS, ICA,H+H)
Acid-Base Excess: 3 mmol/L — ABNORMAL HIGH (ref 0.0–2.0)
Bicarbonate: 27.3 mmol/L (ref 20.0–28.0)
Calcium, Ion: 0.92 mmol/L — ABNORMAL LOW (ref 1.15–1.40)
HCT: 33 % — ABNORMAL LOW (ref 39.0–52.0)
Hemoglobin: 11.2 g/dL — ABNORMAL LOW (ref 13.0–17.0)
O2 Saturation: 86 %
Patient temperature: 99.4
Potassium: 3.1 mmol/L — ABNORMAL LOW (ref 3.5–5.1)
Sodium: 139 mmol/L (ref 135–145)
TCO2: 29 mmol/L (ref 22–32)
pCO2 arterial: 41.6 mmHg (ref 32.0–48.0)
pH, Arterial: 7.427 (ref 7.350–7.450)
pO2, Arterial: 52 mmHg — ABNORMAL LOW (ref 83.0–108.0)

## 2020-05-21 LAB — GLUCOSE, CAPILLARY
Glucose-Capillary: 107 mg/dL — ABNORMAL HIGH (ref 70–99)
Glucose-Capillary: 130 mg/dL — ABNORMAL HIGH (ref 70–99)
Glucose-Capillary: 135 mg/dL — ABNORMAL HIGH (ref 70–99)
Glucose-Capillary: 137 mg/dL — ABNORMAL HIGH (ref 70–99)
Glucose-Capillary: 139 mg/dL — ABNORMAL HIGH (ref 70–99)
Glucose-Capillary: 140 mg/dL — ABNORMAL HIGH (ref 70–99)
Glucose-Capillary: 141 mg/dL — ABNORMAL HIGH (ref 70–99)
Glucose-Capillary: 142 mg/dL — ABNORMAL HIGH (ref 70–99)
Glucose-Capillary: 144 mg/dL — ABNORMAL HIGH (ref 70–99)
Glucose-Capillary: 144 mg/dL — ABNORMAL HIGH (ref 70–99)
Glucose-Capillary: 144 mg/dL — ABNORMAL HIGH (ref 70–99)
Glucose-Capillary: 146 mg/dL — ABNORMAL HIGH (ref 70–99)
Glucose-Capillary: 147 mg/dL — ABNORMAL HIGH (ref 70–99)
Glucose-Capillary: 150 mg/dL — ABNORMAL HIGH (ref 70–99)
Glucose-Capillary: 151 mg/dL — ABNORMAL HIGH (ref 70–99)
Glucose-Capillary: 153 mg/dL — ABNORMAL HIGH (ref 70–99)
Glucose-Capillary: 158 mg/dL — ABNORMAL HIGH (ref 70–99)

## 2020-05-21 LAB — HEPARIN LEVEL (UNFRACTIONATED)
Heparin Unfractionated: 0.33 IU/mL (ref 0.30–0.70)
Heparin Unfractionated: 0.27 IU/mL — ABNORMAL LOW (ref 0.30–0.70)

## 2020-05-21 LAB — COMPREHENSIVE METABOLIC PANEL
ALT: 174 U/L — ABNORMAL HIGH (ref 0–44)
AST: 131 U/L — ABNORMAL HIGH (ref 15–41)
Albumin: 2.2 g/dL — ABNORMAL LOW (ref 3.5–5.0)
Alkaline Phosphatase: 40 U/L (ref 38–126)
Anion gap: 11 (ref 5–15)
BUN: 29 mg/dL — ABNORMAL HIGH (ref 8–23)
CO2: 26 mmol/L (ref 22–32)
Calcium: 6.7 mg/dL — ABNORMAL LOW (ref 8.9–10.3)
Chloride: 101 mmol/L (ref 98–111)
Creatinine, Ser: 3.06 mg/dL — ABNORMAL HIGH (ref 0.61–1.24)
GFR, Estimated: 22 mL/min — ABNORMAL LOW (ref >60)
Glucose, Bld: 156 mg/dL — ABNORMAL HIGH (ref 70–99)
Potassium: 2.9 mmol/L — ABNORMAL LOW (ref 3.5–5.1)
Sodium: 138 mmol/L (ref 135–145)
Total Bilirubin: 0.7 mg/dL (ref 0.3–1.2)
Total Protein: 4.3 g/dL — ABNORMAL LOW (ref 6.5–8.1)

## 2020-05-21 LAB — CBC
HCT: 35.5 % — ABNORMAL LOW (ref 39.0–52.0)
Hemoglobin: 12.2 g/dL — ABNORMAL LOW (ref 13.0–17.0)
MCH: 29.5 pg (ref 26.0–34.0)
MCHC: 34.4 g/dL (ref 30.0–36.0)
MCV: 85.7 fL (ref 80.0–100.0)
Platelets: 126 10*3/uL — ABNORMAL LOW (ref 150–400)
RBC: 4.14 MIL/uL — ABNORMAL LOW (ref 4.22–5.81)
RDW: 14.1 % (ref 11.5–15.5)
WBC: 12.5 10*3/uL — ABNORMAL HIGH (ref 4.0–10.5)
nRBC: 0 % (ref 0.0–0.2)

## 2020-05-21 LAB — COOXEMETRY PANEL
Carboxyhemoglobin: 0.8 % (ref 0.5–1.5)
Methemoglobin: 1.1 % (ref 0.0–1.5)
O2 Saturation: 72.3 %
Total hemoglobin: 11.8 g/dL — ABNORMAL LOW (ref 12.0–16.0)

## 2020-05-21 LAB — LACTIC ACID, PLASMA: Lactic Acid, Venous: 2.7 mmol/L (ref 0.5–1.9)

## 2020-05-21 LAB — MAGNESIUM
Magnesium: 2.2 mg/dL (ref 1.7–2.4)
Magnesium: 2 mg/dL (ref 1.7–2.4)
Magnesium: 1.9 mg/dL (ref 1.7–2.4)

## 2020-05-21 LAB — PHOSPHORUS: Phosphorus: 4 mg/dL (ref 2.5–4.6)

## 2020-05-21 MED ORDER — CALCIUM GLUCONATE-NACL 1-0.675 GM/50ML-% IV SOLN
1.0000 g | Freq: Once | INTRAVENOUS | Status: AC
  Administered 2020-05-21: 1000 mg via INTRAVENOUS
  Filled 2020-05-21: qty 50

## 2020-05-21 MED ORDER — MAGNESIUM SULFATE IN D5W 1-5 GM/100ML-% IV SOLN
1.0000 g | Freq: Once | INTRAVENOUS | Status: AC
  Administered 2020-05-21: 1 g via INTRAVENOUS
  Filled 2020-05-21: qty 100

## 2020-05-21 MED ORDER — POTASSIUM CHLORIDE 10 MEQ/50ML IV SOLN
10.0000 meq | INTRAVENOUS | Status: AC
  Administered 2020-05-22 (×2): 10 meq via INTRAVENOUS
  Filled 2020-05-21 (×2): qty 50

## 2020-05-21 MED ORDER — FUROSEMIDE 10 MG/ML IJ SOLN
40.0000 mg | Freq: Once | INTRAMUSCULAR | Status: AC
  Administered 2020-05-21: 40 mg via INTRAVENOUS
  Filled 2020-05-21: qty 4

## 2020-05-21 MED ORDER — POTASSIUM CHLORIDE 10 MEQ/50ML IV SOLN
10.0000 meq | INTRAVENOUS | Status: DC
  Filled 2020-05-21 (×2): qty 50

## 2020-05-21 MED ORDER — POTASSIUM CHLORIDE 10 MEQ/50ML IV SOLN
10.0000 meq | INTRAVENOUS | Status: AC
  Administered 2020-05-21 (×4): 10 meq via INTRAVENOUS
  Filled 2020-05-21: qty 50

## 2020-05-21 MED ORDER — ACETAMINOPHEN 160 MG/5ML PO SOLN
500.0000 mg | Freq: Three times a day (TID) | ORAL | Status: AC
  Administered 2020-05-21 – 2020-05-22 (×6): 500 mg
  Filled 2020-05-21 (×6): qty 20.3

## 2020-05-21 MED ORDER — POTASSIUM CHLORIDE 20 MEQ PO PACK: 40.0000 meq | PACK | Freq: Once | ORAL | Status: DC

## 2020-05-21 MED ORDER — FUROSEMIDE 10 MG/ML IJ SOLN
6.0000 mg/h | INTRAVENOUS | Status: DC
  Administered 2020-05-21 – 2020-05-22 (×2): 8 mg/h via INTRAVENOUS
  Administered 2020-05-23: 6 mg/h via INTRAVENOUS
  Filled 2020-05-21 (×3): qty 20

## 2020-05-21 MED ORDER — POTASSIUM CHLORIDE 10 MEQ/50ML IV SOLN
10.0000 meq | INTRAVENOUS | Status: AC
  Administered 2020-05-21 (×6): 10 meq via INTRAVENOUS
  Filled 2020-05-21 (×4): qty 50

## 2020-05-21 NOTE — Progress Notes (Signed)
Patient ID: [name], male   DOB: 01-09-1955, 65 y.o.   MRN: [id number]     Advanced Heart Failure Rounding Note  PCP-Cardiologist: Belva Crome III, MD   Subjective:    Weight up 22 lbs, CVP 17.  Lactate down to 2.7, co-ox 72%.  He remains on Giapreza 4, dobutamine 20, epinephrine 15, NE 46, vasopressin 0.04.  MAP stable, slowly trending down pressors. HCO3 gtt decreased this morning to 50 cc/hr. Creatinine up to 3.06, made 1386 cc UOP.   He remains on amiodarone 60, had NSVT yesterday.   CXR with diffuse bilateral infiltrates.  He is on ceftriaxone/Flagyl for aspiration coverage.   He is awake and will follow commands.    Objective:   Weight Range: (!) 150.5 kg Body mass index is 46.3 kg/m.   Vital Signs:   Temp:  [99.2 F (37.3 C)-100.4 F (38 C)] 99.4 F (37.4 C) (05/19 0400) Pulse Rate:  [87-121] 104 (05/19 0752) Resp:  [12-34] 24 (05/19 0752) BP: (110-156)/(45-103) 110/49 (05/19 0752) SpO2:  [87 %-99 %] 92 % (05/19 0752) Arterial Line BP: (87-136)/(45-58) 114/51 (05/19 0730) FiO2 (%):  [70 %-80 %] 80 % (05/19 0752) Weight:  [140.5 kg-150.5 kg] 150.5 kg (05/19 0600) Last BM Date: 05/20/20  Weight change: Filed Weights   05/20/20 0530 05/20/20 1109 05/21/20 0600  Weight: (!) 140.5 kg (!) 140.5 kg (!) 150.5 kg    Intake/Output:   Intake/Output Summary (Last 24 hours) at 05/21/2020 0804 Last data filed at 05/21/2020 0700 Gross per 24 hour  Intake 10321.09 ml  Output 2506 ml  Net 7815.09 ml      Physical Exam    General:  Intubated.  HEENT: Normal Neck: Thick, JVP 16 cm. Carotids 2+ bilat; no bruits. No lymphadenopathy or thyromegaly appreciated. Cor: PMI nondisplaced. Regular rate & rhythm. No rubs, gallops or murmurs. Lungs: Crackles bilaterally.  Abdomen: Soft, nontender, nondistended. No hepatosplenomegaly. No bruits or masses. Good bowel sounds. Extremities: No cyanosis, clubbing, rash. 1+ edema to thighs.  Neuro: Awake on vent, follows  commands.    Telemetry   NSR 90s, personally reviewed  EKG    NSR, RBBB (personally reviewed)  Labs    CBC Recent Labs    05/19/20 1818 05/19/20 2306 05/20/20 0500 05/20/20 0521 05/21/20 0205 05/21/20 0523  WBC 18.1*   < > 16.6*  --  12.5*  --   NEUTROABS 12.2*  --   --   --   --   --   HGB 18.8*   < > 14.1   < > 12.2* 11.2*  HCT 61.2*   < > 44.5   < > 35.5* 33.0*  MCV 95.5   < > 91.6  --  85.7  --   PLT 279   < > 162  --  126*  --    < > = values in this interval not displayed.   Basic Metabolic Panel Recent Labs    05/20/20 1801 05/21/20 0205 05/21/20 0523  NA 138 138 139  K 3.0* 2.9* 3.1*  CL 101 101  --   CO2 24 26  --   GLUCOSE 200* 156*  --   BUN 30* 29*  --   CREATININE 3.04* 3.06*  --   CALCIUM 6.8* 6.7*  --   MG 1.2* 2.2  --    Liver Function Tests Recent Labs    05/20/20 1121 05/21/20 0205  AST 249* 131*  ALT 234* 174*  ALKPHOS 39 40  BILITOT 0.8 0.7  PROT 4.6* 4.3*  ALBUMIN 2.5* 2.2*   No results for input(s): LIPASE, AMYLASE in the last 72 hours. Cardiac Enzymes No results for input(s): CKTOTAL, CKMB, CKMBINDEX, TROPONINI in the last 72 hours.  BNP: BNP (last 3 results) No results for input(s): BNP in the last 8760 hours.  ProBNP (last 3 results) No results for input(s): PROBNP in the last 8760 hours.   D-Dimer No results for input(s): DDIMER in the last 72 hours. Hemoglobin A1C No results for input(s): HGBA1C in the last 72 hours. Fasting Lipid Panel Recent Labs    05/20/20 0527  CHOL 110  HDL 18*  LDLCALC 60  TRIG 160*  CHOLHDL 6.1   Thyroid Function Tests No results for input(s): TSH, T4TOTAL, T3FREE, THYROIDAB in the last 72 hours.  Invalid input(s): FREET3  Other results:   Imaging    DG CHEST PORT 1 VIEW  Result Date: 05/21/2020 CLINICAL DATA:  Intubation.  Cardiac arrest. EXAM: PORTABLE CHEST 1 VIEW COMPARISON:  05/19/2020. FINDINGS: Endotracheal tube, NG tube, right central line in stable position.  Heart size stable. Low lung volumes. Diffuse bilateral pulmonary infiltrates/edema again noted. Interim improvement from prior exam. Small bilateral pleural effusions may be present. No pneumothorax. IMPRESSION: 1.  Lines and tubes in stable position. 2. Low lung volumes. Diffuse bilateral pulmonary infiltrates/edema again noted. Interim improvement from prior exam. Small bilateral pleural effusions may be present. Electronically Signed   By: Marcello Moores  Register   On: 05/21/2020 06:24   ECHOCARDIOGRAM COMPLETE  Result Date: 05/20/2020    ECHOCARDIOGRAM REPORT   Patient Name:   [name] Date of Exam: 05/20/2020 Medical Rec #:  [id number]       Height:       71.0 in Accession #:    [id number]      Weight:       309.7 lb Date of Birth:  December 26, 1955       BSA:          2.539 m Patient Age:    8 years        BP:           114/61 mmHg Patient Gender: M               HR:           120 bpm. Exam Location:  Inpatient Procedure: 2D Echo, Cardiac Doppler, Color Doppler and Intracardiac            Opacification Agent Indications:    Cardiac arrest; R00.9* Unspecified abnormalities of heart beat  History:        Patient has no prior history of Echocardiogram examinations.                 Arrythmias:Cardiac Arrest and Ventricular Fibrillation,                 Signs/Symptoms:Dyspnea and Shortness of Breath; Risk                 Factors:Hypertension, Diabetes and Dyslipidemia. Hypoxia.  Sonographer:    Roseanna Rainbow RDCS Referring Phys: [id number] RHONDA G BARRETT  Sonographer Comments: Technically difficult study due to poor echo windows, patient is morbidly obese and echo performed with patient supine and on artificial respirator. Image acquisition challenging due to patient body habitus. Dr. Aundra Dubin present for first set of images. IMPRESSIONS  1. Left ventricular ejection fraction, by estimation, is 40 to 45%. The left ventricle has mildly decreased function. The left ventricle  demonstrates regional wall motion abnormalities (basal  and mid inferolateral suggestive of RCA disease). There is mild concentric left ventricular hypertrophy. Left ventricular diastolic parameters are indeterminate.  2. Right ventricular systolic function is hyperdynamic. The right ventricular size is normal.  3. A small pericardial effusion is present.  4. The mitral valve is grossly normal. No evidence of mitral valve regurgitation.  5. The aortic valve was not well visualized. Aortic valve regurgitation is not visualized.  6. Aortic dilatation noted. There is borderline dilatation of the aortic root, measuring 38 mm.  7. The inferior vena cava is dilated in size with <50% respiratory variability, suggesting right atrial pressure of 15 mmHg. Comparison(s): No prior Echocardiogram. Conclusion(s)/Recommendation(s): AHF team aware. FINDINGS  Left Ventricle: Left ventricular ejection fraction, by estimation, is 40 to 45%. The left ventricle has mildly decreased function. The left ventricle demonstrates regional wall motion abnormalities. Definity contrast agent was given IV to delineate the left ventricular endocardial borders. The left ventricular internal cavity size was normal in size. There is mild concentric left ventricular hypertrophy. Left ventricular diastolic parameters are indeterminate.  LV Wall Scoring: The posterior wall is hypokinetic. Right Ventricle: The right ventricular size is normal. No increase in right ventricular wall thickness. Right ventricular systolic function is hyperdynamic. Left Atrium: Left atrial size was normal in size. Right Atrium: Right atrial size was normal in size. Pericardium: A small pericardial effusion is present. Mitral Valve: The mitral valve is grossly normal. No evidence of mitral valve regurgitation. Tricuspid Valve: The tricuspid valve is grossly normal. Tricuspid valve regurgitation is not demonstrated. Aortic Valve: The aortic valve was not well visualized. Aortic valve regurgitation is not visualized. Pulmonic Valve:  The pulmonic valve was not well visualized. Pulmonic valve regurgitation is not visualized. No evidence of pulmonic stenosis. Aorta: Aortic dilatation noted. There is borderline dilatation of the aortic root, measuring 38 mm. Venous: The inferior vena cava is dilated in size with less than 50% respiratory variability, suggesting right atrial pressure of 15 mmHg. IAS/Shunts: The atrial septum is grossly normal.  LEFT VENTRICLE PLAX 2D LVIDd:         5.40 cm  Diastology LVIDs:         4.30 cm  LV e' medial:    6.85 cm/s LV PW:         1.10 cm  LV E/e' medial:  11.6 LV IVS:        1.70 cm  LV e' lateral:   5.87 cm/s LVOT diam:     2.60 cm  LV E/e' lateral: 13.6 LV SV:         91 LV SV Index:   36 LVOT Area:     5.31 cm  RIGHT VENTRICLE             IVC RV S prime:     15.00 cm/s  IVC diam: 2.30 cm TAPSE (M-mode): 2.4 cm LEFT ATRIUM           Index       RIGHT ATRIUM           Index LA diam:      2.10 cm 0.83 cm/m  RA Area:     13.00 cm LA Vol (A2C): 23.4 ml 9.21 ml/m  RA Volume:   28.70 ml  11.30 ml/m LA Vol (A4C): 47.5 ml 18.71 ml/m  AORTIC VALVE LVOT Vmax:   106.00 cm/s LVOT Vmean:  83.100 cm/s LVOT VTI:    0.172 m  AORTA Ao Root diam:  3.50 cm Ao Asc diam:  4.00 cm MITRAL VALVE MV Area (PHT): 5.84 cm    SHUNTS MV Decel Time: 130 msec    Systemic VTI:  0.17 m MV E velocity: 79.60 cm/s  Systemic Diam: 2.60 cm MV A velocity: 92.90 cm/s MV E/A ratio:  0.86 Rudean Haskell MD Electronically signed by Rudean Haskell MD Signature Date/Time: 05/20/2020/3:11:43 PM    Final       Medications:     Scheduled Medications: . acetaminophen  500 mg Per Tube Q8H  . aspirin  81 mg Per Tube Daily  . atorvastatin  80 mg Per Tube Daily  . chlorhexidine gluconate (MEDLINE KIT)  15 mL Mouth Rinse BID  . Chlorhexidine Gluconate Cloth  6 each Topical Daily  . Chlorhexidine Gluconate Cloth  6 each Topical Daily  . clopidogrel  75 mg Per Tube Q breakfast  . docusate  100 mg Per Tube BID  . fentaNYL (SUBLIMAZE)  injection  50 mcg Intravenous Once  . furosemide  40 mg Intravenous Once  . hydrocortisone sod succinate (SOLU-CORTEF) inj  100 mg Intravenous Q12H  . mouth rinse  15 mL Mouth Rinse 10 times per day  . pantoprazole sodium  40 mg Per Tube Daily  . polyethylene glycol  17 g Per Tube Daily  . sodium chloride flush  10-40 mL Intracatheter Q12H  . sodium chloride flush  3 mL Intravenous Q12H     Infusions: . sodium chloride    . amiodarone 60 mg/hr (05/21/20 0700)  . angiotensin II (GIAPREZA) infusion 5 ng/kg/min (05/21/20 0700)  . cefTRIAXone (ROCEPHIN)  IV Stopped (05/21/20 0022)  . DOBUTamine 20 mcg/kg/min (05/21/20 0700)  . epinephrine 15 mcg/min (05/21/20 0700)  . fentaNYL infusion INTRAVENOUS 250 mcg/hr (05/21/20 0700)  . furosemide (LASIX) 200 mg in dextrose 5% 100 mL (58m/mL) infusion    . heparin 1,600 Units/hr (05/21/20 0700)  . insulin 8 mL/hr at 05/21/20 0700  . metronidazole Stopped (05/21/20 0301)  . midazolam 3 mg/hr (05/21/20 0700)  . norepinephrine (LEVOPHED) Adult infusion 46 mcg/min (05/21/20 0700)  . potassium chloride 10 mEq (05/21/20 0724)  .  sodium bicarbonate (isotonic) infusion in sterile water 125 mL/hr at 05/21/20 0700  . vasopressin 0.04 Units/min (05/21/20 0723)     PRN Medications:  sodium chloride, acetaminophen, dextrose, fentaNYL, midazolam, nitroGLYCERIN, ondansetron (ZOFRAN) IV, sodium chloride flush, sodium chloride flush  Assessment/Plan   1. Shock: Primarily vasodilatory shock at this point.  SVR 537 on FloTrac with CI 3.4, co-ox 72%. Lactate down to 2.7 last check.  Suspect vasodilatory shock due to prolonged hypotension with arrest and tissue ischemia. Echo with LV EF in the 45% range with mildly dilated RV but function appeared relatively normal. Now with weight up 22 lbs and volume overloaded with CVP 17. Giapreza 4, dobutamine 20, epinephrine 15, NE 46, vasopressin 0.04.  - HCO3 gtt decreased to 50 cc/hr, will stop later today.  - Continue  hydrocortisone per CCM.  - Starting to wean down pressors, 1st wean off Giapreza.   - Do not think mechanical support would be helpful with good cardiac index and low SVR.  - Decrease dobutamine down to 15 and if tolerates this drop to 10.  - Lasix 40 mg IV bolus and 8 mg/hr gtt, follow response.  2. CAD: Patient appears to have CTO of the mid RCA with good collaterals.  I do not think this was acute MI from plaque rupture.  HS-TnI significantly elevated, but think this was generalized ischemia  from hypotension with prolonged cardiac arrest.  - Continue ASA/Plavx.  - Continue statin.  - He is on heparin gtt.  3. Cardiac arrest: VF arrest with immediate CPR but prolonged time to ROSC (about 45 minutes).   - amiodarone gtt, decrease to 30 mg/hr.  4. AKI: Creatinine up to 3.06 but still making urine.  Suspect ATN in setting of prolonged cardiac arrest.  - Start Lasix gtt today.   5. Acute hypoxemic respiratory failure: CXR with bilateral upper lobe infiltrates likely due to aspiration with arrest.  - Vent per CCM.  - ceftriaxone/Flagyl for aspiration coverage.  6. Neuro: Patient will follow commands, appears neurologically intact.  7. Elevated LFTs: Relatively mild considering down-time.  Shock liver.   8. Diabetes: Insulin.   CRITICAL CARE Performed by: Loralie Champagne  Total critical care time: 40 minutes  Critical care time was exclusive of separately billable procedures and treating other patients.  Critical care was necessary to treat or prevent imminent or life-threatening deterioration.  Critical care was time spent personally by me on the following activities: development of treatment plan with patient and/or surrogate as well as nursing, discussions with consultants, evaluation of patient's response to treatment, examination of patient, obtaining history from patient or surrogate, ordering and performing treatments and interventions, ordering and review of laboratory studies,  ordering and review of radiographic studies, pulse oximetry and re-evaluation of patient's condition.   Length of Stay: 2  Loralie Champagne, MD  05/21/2020, 8:04 AM  Advanced Heart Failure Team Pager [phone] (M-F; 7a - 5p)  Please contact [facility] for night-coverage after hours (5p -7a ) and weekends on [email or web address]

## 2020-05-21 NOTE — Progress Notes (Signed)
[place] notification ID [id number] submitted online through [place] portal.  CSW attempted to visit the patient at bedside to introduce self as the heart failure social worker and to complete a very brief SDOH screening with the patient to address social needs as needed however the patient was unresponsive and unable to engage in conversation at this time.   CSW will continue to follow for discharge needs and will check back with the patient at another time.  TOC will continue to follow through discharge.  Sofia Vanmeter, MSW, [place] Heart Failure Social Worker

## 2020-05-21 NOTE — Progress Notes (Signed)
Initial Nutrition Assessment  DOCUMENTATION CODES:   Not applicable  INTERVENTION:   Once hemodynamics improve:  -Vital AF 1.2 @ 20 ml/hr via OG -Increase per tolerance to goal rate of 65 ml/hr (1560 ml) -ProSource TF 45 ml QID  Provides: 2032 kcals, 161 grams protein, 1265 ml free water.   NUTRITION DIAGNOSIS:   Increased nutrient needs related to acute illness as evidenced by estimated needs.  GOAL:   Patient will meet greater than or equal to 90% of their needs  MONITOR:   Weight trends,Labs,I & O's,Vent status,Skin,TF tolerance  REASON FOR ASSESSMENT:   Ventilator    ASSESSMENT:   Patient with PMH significant for DM, GERD, colonic polyps, s/p total hip replacement, HLD, and HTN. Presents this admission with VF cardiac arrest.   Pt discussed during ICU rounds and with RN.   Requiring high dose pressors. Started on lasix drip. On insulin drip. Follows commands, too unstable to extubate. Abdomen distended and firm. Last BM yesterday. Once hemodynamics improve will discuss feeding plan with CCM.   Weight history limited over last year.   Patient is currently intubated on ventilator support MV: 14.4 L/min Temp (24hrs), Avg:99.6 F (37.6 C), Min:99.2 F (37.3 C), Max:100.4 F (38 C)   UOP: 1386 ml x 24 hrs OG: 1200 ml x 24 hrs   Drips: epinephrine, 200 mg lasix in D5 @ 4 ml/hr, insulin, levophed, vasopressin Medications: colace, solucortef, miralax Labs: K 3.1 (L) Cr 3.06-trending up LFTs elevated   NUTRITION - FOCUSED PHYSICAL EXAM:  Flowsheet Row Most Recent Value  Orbital Region No depletion  Upper Arm Region No depletion  Thoracic and Lumbar Region Unable to assess  Buccal Region Unable to assess  Temple Region No depletion  Clavicle Bone Region No depletion  Clavicle and Acromion Bone Region No depletion  Scapular Bone Region Unable to assess  Dorsal Hand No depletion  Patellar Region No depletion  Anterior Thigh Region No depletion  Posterior  Calf Region No depletion  Edema (RD Assessment) Mild  Hair Reviewed  Eyes Unable to assess  Mouth Unable to assess  Skin Reviewed  Nails Reviewed     Diet Order:   Diet Order            Diet NPO time specified  Diet effective now                 EDUCATION NEEDS:   Not appropriate for education at this time  Skin:  Skin Assessment: Reviewed RN Assessment  Last BM:  5/18  Height:   Ht Readings from Last 1 Encounters:  05/20/20 5' 10.98" (1.803 m)    Weight:   Wt Readings from Last 1 Encounters:  05/21/20 (!) 150.5 kg    BMI:  Body mass index is 46.3 kg/m.  Estimated Nutritional Needs:   Kcal:  6213-0865 kcal  Protein:  156-196 grams  Fluid:  >/= 1.5 L/day  Mariana Single RD, LDN Clinical Nutrition Pager listed in [place]

## 2020-05-21 NOTE — Progress Notes (Signed)
[facility] Progress Note Patient Name: [name] DOB: [birth year] MRN: [id number]   Date of Service  05/21/2020  HPI/Events of Note  Multiple issues: 1. K+ 3.4 and Creatinine = 2.83. 2. Mg++ = 1.9 and 3. Ca++ = 6.4 which corrects to 7.84 (Low) given albumin = 2.2.  eICU Interventions  Plan: 1. Will replace K+ (additional 2 runs), Mg++ and Ca++.     Intervention Category Major Interventions: Electrolyte abnormality - evaluation and management  Analaya Hoey [place] 05/21/2020, 10:15 PM

## 2020-05-21 NOTE — Progress Notes (Signed)
NAME:  [name], MRN:  [id number], DOB:  October 19, 1955, LOS: 2 ADMISSION DATE:  05/19/2020, CONSULTATION DATE: 05/19/2020 REFERRING MD: Dina Rich - ED Zacarias Pontes, CHIEF COMPLAINT: Cardiogenic shock  History of Present Illness:  65 year old man with PMHx significant for HTN, HLD, T2DM, GERD, chronic pain, RBBB and first degree AV block who presented with chest pain at [facility].  OOH VF arrest with immediate bystander CPR and EMS activation with > 45 minutes total CPR. Post arrest, felt to have inferior wall STEMI on EKG. Intubated, sedated and transferred to [facility] for potential PCI.  On arrival to [facility], EKG no longer concerning for inferior wall STEMI.  At that time, patient was awake and following commands on sedation. Initial plan to admit patient as NSTEMI and proceed to angiography in the morning following medical optimization.  Overnight 5/17, patient became hypotensive to SBP 80s with emesis x 1. Repeat EKG demonstrated marked ST elevation in the inferior leads. Patient was taken to cath lab where mid-RCA lesion (total occlusion) was noted but not stented (suspect chronic lesion due to adequate collateral flow). Patient returned to CVICU. Heparin and DAPT initiated for ACS. Remained hypotensive requiring initiation of multiple vasopressors for management of distributive shock.  Pertinent Medical History   Past Medical History:  Diagnosis Date  . Adenomatous polyp   . Cataract    left eye for sure   . Chronic pain   . Diabetes mellitus   . GERD (gastroesophageal reflux disease)   . History of colonic polyps   . History of hip replacement, total   . Hx of anaphylactic shock   . Hyperlipidemia   . Hypertension   . Left anterior hemiblock   . Obesity   . RBBB (right bundle branch block with left anterior fascicular block)   . 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 5/17 Admitted to [facility] as transfer from [facility]. S/p VF arrest with 45 mins CPR before ROSC. Left femoral line inserted. C/f inferior STEMI. Coronary angiography with PCI RCA lesion, not stented, thought to be chronic . 5/18 Remains hypotensive on multiple pressors. LA and troponins downtrending. Weaning Giapreza. SVR and CO remain suboptimal . 5/19 Weaning DBA, Giapreza. Starting lasix gtt. Able to follow commands.  Interim History / Subjective:  Last evening had an episode of VF, self-aborted. Was given additional amiodarone.  Objective   Blood pressure 120/73, pulse 95, temperature 99.4 F (37.4 C), temperature source Axillary, resp. rate (!) 24, height 5' 10.98" (1.803 m), weight (!) 150.5 kg, SpO2 95 %. CVP:  [5 mmHg-15 mmHg] 14 mmHg  Vent Mode: PRVC FiO2 (%):  [70 %-80 %] 80 % Set Rate:  [24 bmp] 24 bmp Vt Set:  [600 mL] 600 mL PEEP:  [10 cmH20] 10 cmH20 Plateau Pressure:  [24 cmH20-29 cmH20] 29 cmH20   Intake/Output Summary (Last 24 hours) at 05/21/2020 0958 Last data filed at 05/21/2020 8338 Gross per 24 hour  Intake 10601.72 ml  Output 2480 ml  Net 8121.72 ml   Filed Weights   05/20/20 0530 05/20/20 1109 05/21/20 0600  Weight: (!) 140.5 kg (!) 140.5 kg (!) 150.5 kg   Physical Examination: General: critically ill appearing man lying in bed in NAD. HEENT:  [place]/AT, eyes anicteric.  Endotracheal tube in place. Neuro: RASS -1, able to nod to answer questions.  Moving all extremities  on command. CV:  S1S2, regular rate and rhythm PULM:  Breathing comfortably on the vent, some rhonchi, blood-tinged secretions from endotracheal tube. GI:  Obese, soft, nontender, nondistended.  Hypoactive bowel sounds. Extremities: Severe edema in all extremities Skin:  Pallor, warm and dry, no rashes  Labs/imaging that I have personally reviewed: (right click and "Reselect all SmartList Selections" daily)  K+ 2.7 Potassium 2.9 BUN 29 Creatinine 3.06 AST 131 LDL  74 Bilirubin 0.7 BC 12 point ARDS H/H12.2/35.5 7.4 3/42/52/87  Resolved Hospital Problem list   None  Assessment & Plan:  Acute hypoxic respiratory failure secondary to acute pulmonary edema requiring mechanical ventilation ARDS due to aspiration pneumonitis- improving Concern for aspiration PNA -Continue low tidal volume ventilation, 4 to 8 cc/kg ideal body weight goal plateau less than 30 driving pressure less than 15 - VAP prevention protocol - Propofol for sedation - SAT and SBT as appropriate.  Currently remains too unstable for extubation. -Wean FiO2 as able to keep SPO2 greater than 90%.- Wean FiO2 for O2 sat > 90% -Continue ceftriaxone for likely right lower lobe pneumonia -Agree with starting Lasix infusion  Cardiogenic shock secondary to acute coronary syndrome likely involving the inferior wall requiring titration of vasoactive infusions VF cardiac arrest Echo LVEF 40-45%. S/p cath lab where mid-RCA lesion (total occlusion) was noted but not stented, suspected to be chronic due to well-developed collaterals. -Appreciate cardiology's assistance.  Decreasing dobutamine.  Continue weaning Giapreza -Lasix infusion - Continue norepinephrine, epinephrine, vasopressin -Monitor electrolytes closely and replete as needed -Continue amiodarone, decreased to 30 mg/h -Continue dual antiplatelet therapy & heparin - Continue atorvastatin  Distributive shock with concern for mesenteric ischemia (in the setting of profound hypotension) and global hypoxia reperfusion injury -Continue vasopressors to maintain adequate perfusion - Lasix gtt - Continue empiric ceftriaxone, metronidazole  Uncontrolled type 2 diabetes - Insulin gtt -Goal BG 140-1 80 while admitted to the ICU  AKI -Continue to monitor renal function - Renally dose meds and avoid nephrotoxic meds - Strict I's/O, needs aggressive diuresis  Hypokalemia - Supplemental potassium, recheck this afternoon  Elevated  transaminases from shock liver, improving -con't to monitor periodically  Hypertension - Hold home antihypertensives in the setting of shock  Best practice (right click and "Reselect all SmartList Selections" daily)  Diet:  NPO Pain/Anxiety/Delirium protocol (if indicated): Yes (RASS goal -1) - Versed and Fentanyl infusions VAP protocol (if indicated): Yes DVT prophylaxis: Systemic AC GI prophylaxis: PPI Glucose control:  Insulin gtt Central venous access:  Yes, and it is still needed Arterial line:  Yes, and it is still needed Foley:  Yes, and it is still needed Mobility:  bed rest  PT consulted: N/A Last date of multidisciplinary goals of care discussion [wife updated at bedside 5/19] Code Status:  DNR Disposition: ICU   This patient is critically ill with multiple organ system failure which requires frequent high complexity decision making, assessment, support, evaluation, and titration of therapies. This was completed through the application of advanced monitoring technologies and extensive interpretation of multiple databases. During this encounter critical care time was devoted to patient care services described in this note for 45 minutes.  Julian Hy, DO 05/21/20 10:12 AM [facility]

## 2020-05-21 NOTE — Progress Notes (Signed)
ANTICOAGULATION CONSULT NOTE - Initial Consult  Pharmacy Consult for heparin Indication: ACS/cardiac arrest  Patient Measurements: Height: 5' 10.98" (180.3 cm) Weight: (!) 150.5 kg (331 lb 12.7 oz) IBW/kg (Calculated) : 75.26 Heparin Dosing Weight: 108kg  Vital Signs: Temp: 99.4 F (37.4 C) (05/19 0400) Temp Source: Axillary (05/19 0400) BP: 136/69 (05/19 0500) Pulse Rate: 101 (05/19 0515)  Labs: Recent Labs    05/19/20 2221 05/19/20 2306 05/20/20 0042 05/20/20 0046 05/20/20 0500 05/20/20 0521 05/20/20 0527 05/20/20 0939 05/20/20 1121 05/20/20 1240 05/20/20 1801 05/21/20 0205 05/21/20 0523  HGB  --    < > 14.0   < > 14.1 14.3  --   --   --   --   --  12.2* 11.2*  HCT  --    < > 44.3   < > 44.5 42.0  --   --   --   --   --  35.5* 33.0*  PLT  --   --  219  --  162  --   --   --   --   --   --  126*  --   LABPROT 18.7*  --   --   --  16.2*  --   --   --   --   --   --   --   --   INR 1.6*  --   --   --  1.3*  --   --   --   --   --   --   --   --   HEPARINUNFRC  --   --   --   --   --   --   --  0.12*  --   --  0.29* 0.33  --   CREATININE  --   --   --    < > 2.36*  --   --   --  2.82*  --  3.04* 3.06*  --   TROPONINIHS 9,947*  --   --   --   --   --    < >  --  8,258* 7,858* 5,624*  --   --    < > = values in this interval not displayed.    Estimated Creatinine Clearance: 35.4 mL/min (A) (by C-G formula based on SCr of 3.06 mg/dL (H)).   Assessment: 65yo male c/o CP at OSH >> suffered Vfib arrest requiring 56min of CPR, tx'd to [facility] and sent to cath lab for STEMI vs NSTEMI, now post-cath during which no intervention could be completed.   HL (therapeutic) 0.33 CBC Hgb 11.2, Plt 126  Goal of Therapy:  Heparin level 0.3-0.7 units/ml Monitor platelets by anticoagulation protocol: Yes   Plan:  Continue heparin drip at current rate 1600 units / hr 6hr confirmatory heparin level at 1400 F/u CBC, HL, s/sx bleeding, and renal function    Wilson Singer,  PharmD PGY1 Pharmacy Resident 05/21/2020 6:20 AM

## 2020-05-21 NOTE — Care Management (Signed)
05-21-20 Case Manager called central intake for [facility] Authorization- # [id number]. Bethena Roys, RN,BSN Case Manager

## 2020-05-21 NOTE — Progress Notes (Signed)
[facility] notified of evening BMP, mag results. MD to f/u with replacement orders.

## 2020-05-21 NOTE — Progress Notes (Signed)
[facility] Progress Note Patient Name: [name] DOB: 21-Nov-1955 MRN: [id number]   Date of Service  05/21/2020  HPI/Events of Note  K+ 2.9, creatinine 3.06.  eICU Interventions  K+ replaced per modified adult electrolyte replacement protocol.        [name] 05/21/2020, 3:14 AM

## 2020-05-21 NOTE — Plan of Care (Signed)
  Problem: Clinical Measurements: Goal: Ability to maintain clinical measurements within normal limits will improve Outcome: Progressing Goal: Will remain free from infection Outcome: Progressing Goal: Diagnostic test results will improve Outcome: Progressing Goal: Respiratory complications will improve Outcome: Progressing Goal: Cardiovascular complication will be avoided Outcome: Progressing   Problem: Pain Managment: Goal: General experience of comfort will improve Outcome: Progressing   Problem: Safety: Goal: Ability to remain free from injury will improve Outcome: Progressing   Problem: Skin Integrity: Goal: Risk for impaired skin integrity will decrease Outcome: Progressing   Problem: Cardiac: Goal: Ability to achieve and maintain adequate cardiopulmonary perfusion will improve Outcome: Progressing Goal: Vascular access site(s) Level 0-1 will be maintained Outcome: Progressing   Problem: Respiratory: Goal: Will regain and/or maintain adequate ventilation Outcome: Progressing

## 2020-05-22 DIAGNOSIS — R570 Cardiogenic shock: Secondary | ICD-10-CM

## 2020-05-22 DIAGNOSIS — I469 Cardiac arrest, cause unspecified: Secondary | ICD-10-CM | POA: Diagnosis not present

## 2020-05-22 LAB — COMPREHENSIVE METABOLIC PANEL
ALT: 134 U/L — ABNORMAL HIGH (ref 0–44)
AST: 68 U/L — ABNORMAL HIGH (ref 15–41)
Albumin: 2.4 g/dL — ABNORMAL LOW (ref 3.5–5.0)
Alkaline Phosphatase: 52 U/L (ref 38–126)
Anion gap: 9 (ref 5–15)
BUN: 31 mg/dL — ABNORMAL HIGH (ref 8–23)
CO2: 28 mmol/L (ref 22–32)
Calcium: 6.5 mg/dL — ABNORMAL LOW (ref 8.9–10.3)
Chloride: 95 mmol/L — ABNORMAL LOW (ref 98–111)
Creatinine, Ser: 2.86 mg/dL — ABNORMAL HIGH (ref 0.61–1.24)
GFR, Estimated: 24 mL/min — ABNORMAL LOW (ref >60)
Glucose, Bld: 152 mg/dL — ABNORMAL HIGH (ref 70–99)
Potassium: 3.7 mmol/L (ref 3.5–5.1)
Sodium: 132 mmol/L — ABNORMAL LOW (ref 135–145)
Total Bilirubin: 1 mg/dL (ref 0.3–1.2)
Total Protein: 4.7 g/dL — ABNORMAL LOW (ref 6.5–8.1)

## 2020-05-22 LAB — CBC
HCT: 34.9 % — ABNORMAL LOW (ref 39.0–52.0)
Hemoglobin: 11.9 g/dL — ABNORMAL LOW (ref 13.0–17.0)
MCH: 29.7 pg (ref 26.0–34.0)
MCHC: 34.1 g/dL (ref 30.0–36.0)
MCV: 87 fL (ref 80.0–100.0)
Platelets: 138 10*3/uL — ABNORMAL LOW (ref 150–400)
RBC: 4.01 MIL/uL — ABNORMAL LOW (ref 4.22–5.81)
RDW: 14.7 % (ref 11.5–15.5)
WBC: 13.2 10*3/uL — ABNORMAL HIGH (ref 4.0–10.5)
nRBC: 0 % (ref 0.0–0.2)

## 2020-05-22 LAB — GLUCOSE, CAPILLARY
Glucose-Capillary: 133 mg/dL — ABNORMAL HIGH (ref 70–99)
Glucose-Capillary: 137 mg/dL — ABNORMAL HIGH (ref 70–99)
Glucose-Capillary: 143 mg/dL — ABNORMAL HIGH (ref 70–99)
Glucose-Capillary: 144 mg/dL — ABNORMAL HIGH (ref 70–99)
Glucose-Capillary: 159 mg/dL — ABNORMAL HIGH (ref 70–99)
Glucose-Capillary: 161 mg/dL — ABNORMAL HIGH (ref 70–99)
Glucose-Capillary: 166 mg/dL — ABNORMAL HIGH (ref 70–99)
Glucose-Capillary: 168 mg/dL — ABNORMAL HIGH (ref 70–99)
Glucose-Capillary: 169 mg/dL — ABNORMAL HIGH (ref 70–99)
Glucose-Capillary: 172 mg/dL — ABNORMAL HIGH (ref 70–99)
Glucose-Capillary: 194 mg/dL — ABNORMAL HIGH (ref 70–99)
Glucose-Capillary: 203 mg/dL — ABNORMAL HIGH (ref 70–99)
Glucose-Capillary: 75 mg/dL (ref 70–99)

## 2020-05-22 LAB — PHOSPHORUS: Phosphorus: 3.4 mg/dL (ref 2.5–4.6)

## 2020-05-22 LAB — COOXEMETRY PANEL
Carboxyhemoglobin: 1 % (ref 0.5–1.5)
Methemoglobin: 1.1 % (ref 0.0–1.5)
O2 Saturation: 72.5 %
Total hemoglobin: 12 g/dL (ref 12.0–16.0)

## 2020-05-22 LAB — MAGNESIUM: Magnesium: 2 mg/dL (ref 1.7–2.4)

## 2020-05-22 MED ORDER — DEXMEDETOMIDINE HCL IN NACL 400 MCG/100ML IV SOLN
0.4000 ug/kg/h | INTRAVENOUS | Status: DC
  Administered 2020-05-22: 1.2 ug/kg/h via INTRAVENOUS
  Administered 2020-05-23: 0.6 ug/kg/h via INTRAVENOUS
  Administered 2020-05-23: 0.9 ug/kg/h via INTRAVENOUS
  Administered 2020-05-23: 0.6 ug/kg/h via INTRAVENOUS
  Administered 2020-05-23: 0.9 ug/kg/h via INTRAVENOUS
  Administered 2020-05-23: 0.7 ug/kg/h via INTRAVENOUS
  Administered 2020-05-23: 0.6 ug/kg/h via INTRAVENOUS
  Administered 2020-05-24 (×2): 1.2 ug/kg/h via INTRAVENOUS
  Administered 2020-05-24: 1 ug/kg/h via INTRAVENOUS
  Administered 2020-05-24: 0.7 ug/kg/h via INTRAVENOUS
  Administered 2020-05-24 (×2): 0.5 ug/kg/h via INTRAVENOUS
  Administered 2020-05-25 (×3): 0.6 ug/kg/h via INTRAVENOUS
  Administered 2020-05-25: 0.7 ug/kg/h via INTRAVENOUS
  Administered 2020-05-26: 0.6 ug/kg/h via INTRAVENOUS
  Administered 2020-05-26: 0.5 ug/kg/h via INTRAVENOUS
  Filled 2020-05-22: qty 200
  Filled 2020-05-22: qty 100
  Filled 2020-05-22 (×2): qty 200
  Filled 2020-05-22: qty 100
  Filled 2020-05-22 (×3): qty 200
  Filled 2020-05-22 (×8): qty 100

## 2020-05-22 MED ORDER — INSULIN ASPART 100 UNIT/ML IJ SOLN
0.0000 [IU] | INTRAMUSCULAR | Status: DC
  Administered 2020-05-22: 4 [IU] via SUBCUTANEOUS
  Administered 2020-05-22: 3 [IU] via SUBCUTANEOUS
  Administered 2020-05-22 – 2020-05-23 (×3): 7 [IU] via SUBCUTANEOUS
  Administered 2020-05-23: 11 [IU] via SUBCUTANEOUS
  Administered 2020-05-23 (×2): 7 [IU] via SUBCUTANEOUS
  Administered 2020-05-23: 10 [IU] via SUBCUTANEOUS
  Administered 2020-05-23: 11 [IU] via SUBCUTANEOUS
  Administered 2020-05-24 (×2): 7 [IU] via SUBCUTANEOUS
  Administered 2020-05-24: 11 [IU] via SUBCUTANEOUS
  Administered 2020-05-24: 4 [IU] via SUBCUTANEOUS
  Administered 2020-05-24 – 2020-05-25 (×3): 7 [IU] via SUBCUTANEOUS
  Administered 2020-05-25 (×3): 4 [IU] via SUBCUTANEOUS
  Administered 2020-05-25: 3 [IU] via SUBCUTANEOUS
  Administered 2020-05-26 (×2): 4 [IU] via SUBCUTANEOUS
  Administered 2020-05-26: 3 [IU] via SUBCUTANEOUS
  Administered 2020-05-26: 4 [IU] via SUBCUTANEOUS
  Administered 2020-05-27 (×2): 7 [IU] via SUBCUTANEOUS
  Administered 2020-05-27 (×2): 4 [IU] via SUBCUTANEOUS
  Administered 2020-05-27: 7 [IU] via SUBCUTANEOUS
  Administered 2020-05-28: 11 [IU] via SUBCUTANEOUS
  Administered 2020-05-28: 3 [IU] via SUBCUTANEOUS
  Administered 2020-05-28: 4 [IU] via SUBCUTANEOUS
  Administered 2020-05-28 – 2020-05-29 (×3): 3 [IU] via SUBCUTANEOUS
  Administered 2020-05-29 (×2): 7 [IU] via SUBCUTANEOUS
  Administered 2020-05-29: 4 [IU] via SUBCUTANEOUS

## 2020-05-22 MED ORDER — INSULIN DETEMIR 100 UNIT/ML ~~LOC~~ SOLN
15.0000 [IU] | Freq: Once | SUBCUTANEOUS | Status: AC
  Administered 2020-05-22: 15 [IU] via SUBCUTANEOUS
  Filled 2020-05-22: qty 0.15

## 2020-05-22 MED ORDER — POLYVINYL ALCOHOL 1.4 % OP SOLN
1.0000 [drp] | OPHTHALMIC | Status: DC | PRN
  Administered 2020-05-23 – 2020-05-26 (×3): 1 [drp] via OPHTHALMIC
  Filled 2020-05-22: qty 15

## 2020-05-22 MED ORDER — POTASSIUM CHLORIDE 20 MEQ PO PACK
20.0000 meq | PACK | Freq: Two times a day (BID) | ORAL | Status: DC
  Administered 2020-05-22 – 2020-05-23 (×3): 20 meq
  Filled 2020-05-22 (×3): qty 1

## 2020-05-22 MED ORDER — KETOTIFEN FUMARATE 0.025 % OP SOLN
1.0000 [drp] | Freq: Three times a day (TID) | OPHTHALMIC | Status: DC | PRN
  Administered 2020-05-22 – 2020-05-25 (×5): 1 [drp] via OPHTHALMIC
  Filled 2020-05-22: qty 5

## 2020-05-22 MED ORDER — PROSOURCE TF PO LIQD
45.0000 mL | Freq: Four times a day (QID) | ORAL | Status: DC
  Administered 2020-05-22 – 2020-05-24 (×8): 45 mL
  Filled 2020-05-22 (×7): qty 45

## 2020-05-22 MED ORDER — POTASSIUM CHLORIDE 10 MEQ/50ML IV SOLN
10.0000 meq | INTRAVENOUS | Status: AC
  Administered 2020-05-22 (×4): 10 meq via INTRAVENOUS
  Filled 2020-05-22 (×5): qty 50

## 2020-05-22 MED ORDER — INSULIN DETEMIR 100 UNIT/ML ~~LOC~~ SOLN
30.0000 [IU] | Freq: Two times a day (BID) | SUBCUTANEOUS | Status: DC
  Administered 2020-05-22 – 2020-05-24 (×4): 30 [IU] via SUBCUTANEOUS
  Filled 2020-05-22 (×5): qty 0.3

## 2020-05-22 MED ORDER — ENOXAPARIN SODIUM 80 MG/0.8ML IJ SOSY
70.0000 mg | PREFILLED_SYRINGE | INTRAMUSCULAR | Status: DC
  Administered 2020-05-22 – 2020-05-25 (×4): 70 mg via SUBCUTANEOUS
  Filled 2020-05-22 (×5): qty 0.7

## 2020-05-22 MED ORDER — DOBUTAMINE IN D5W 4-5 MG/ML-% IV SOLN
1.0000 ug/kg/min | INTRAVENOUS | Status: DC
  Administered 2020-05-22 (×2): 5 ug/kg/min via INTRAVENOUS
  Administered 2020-05-23: 2.5 ug/kg/min via INTRAVENOUS
  Filled 2020-05-22 (×2): qty 250

## 2020-05-22 MED ORDER — VITAL AF 1.2 CAL PO LIQD
1000.0000 mL | ORAL | Status: DC
  Administered 2020-05-22 – 2020-05-24 (×2): 1000 mL

## 2020-05-22 NOTE — Plan of Care (Signed)
  Problem: Education: Goal: Knowledge of General Education information will improve Description: Including pain rating scale, medication(s)/side effects and non-pharmacologic comfort measures Outcome: Progressing   Problem: Health Behavior/Discharge Planning: Goal: Ability to manage health-related needs will improve Outcome: Progressing   Problem: Clinical Measurements: Goal: Ability to maintain clinical measurements within normal limits will improve Outcome: Progressing Goal: Will remain free from infection Outcome: Progressing Goal: Diagnostic test results will improve Outcome: Progressing Goal: Respiratory complications will improve Outcome: Progressing Goal: Cardiovascular complication will be avoided Outcome: Progressing   Problem: Activity: Goal: Risk for activity intolerance will decrease Outcome: Progressing   Problem: Nutrition: Goal: Adequate nutrition will be maintained Outcome: Progressing   Problem: Coping: Goal: Level of anxiety will decrease Outcome: Progressing   Problem: Elimination: Goal: Will not experience complications related to bowel motility Outcome: Progressing Goal: Will not experience complications related to urinary retention Outcome: Progressing   Problem: Pain Managment: Goal: General experience of comfort will improve Outcome: Progressing   Problem: Safety: Goal: Ability to remain free from injury will improve Outcome: Progressing   Problem: Skin Integrity: Goal: Risk for impaired skin integrity will decrease Outcome: Progressing   Problem: Cardiac: Goal: Ability to achieve and maintain adequate cardiopulmonary perfusion will improve Outcome: Progressing Goal: Vascular access site(s) Level 0-1 will be maintained Outcome: Progressing   Problem: Fluid Volume: Goal: Ability to achieve a balanced intake and output will improve Outcome: Progressing   Problem: Physical Regulation: Goal: Complications related to the disease  process, condition or treatment will be avoided or minimized Outcome: Progressing   Problem: Respiratory: Goal: Will regain and/or maintain adequate ventilation Outcome: Progressing

## 2020-05-22 NOTE — Progress Notes (Signed)
[place] Progress Note Patient Name: [name] DOB: 1955/08/23 MRN: [id number]   Date of Service  05/22/2020  HPI/Events of Note  Agitation - Patient reaching for IV lines and picking at sheets.   eICU Interventions  Plan: 1. Precedex IV infusion. Titrate to RASS = 0 to -1.     Intervention Category Major Interventions: Delirium, psychosis, severe agitation - evaluation and management  Alessa Mazur [place] 05/22/2020, 10:40 PM

## 2020-05-22 NOTE — Progress Notes (Addendum)
Patient ID: [name], male   DOB: 1955/02/01, 65 y.o.   MRN: [id number]     Advanced Heart Failure Rounding Note  PCP-Cardiologist: Belva Crome III, MD   Subjective:   Admit weight 253-->331     Lactate down to 2.7, co-ox 72%.  Remains on dobutamine 20, epinephrine 10, NE 21, vasopressin 0.04.   Creatinine 3.1>2.9  . Making >2 liters urine.   FloTrack CO 8 CI 3.4   He remains on amiodarone 60  CXR with diffuse bilateral infiltrates.  He is on ceftriaxone/Flagyl for aspiration coverage.   Following commands.    Objective:   Weight Range: (!) 150.5 kg Body mass index is 46.3 kg/m.   Vital Signs:   Temp:  [98.9 F (37.2 C)-100.4 F (38 C)] 100.2 F (37.9 C) (05/20 0245) Pulse Rate:  [80-104] 83 (05/20 0500) Resp:  [16-32] 24 (05/20 0500) BP: (105-142)/(49-85) 136/77 (05/19 2100) SpO2:  [90 %-100 %] 100 % (05/20 0500) Arterial Line BP: (96-140)/(46-110) 131/54 (05/20 0500) FiO2 (%):  [80 %] 80 % (05/20 0400) Last BM Date: 05/20/20  Weight change: Filed Weights   05/20/20 0530 05/20/20 1109 05/21/20 0600  Weight: (!) 140.5 kg (!) 140.5 kg (!) 150.5 kg    Intake/Output:   Intake/Output Summary (Last 24 hours) at 05/22/2020 0736 Last data filed at 05/22/2020 0700 Gross per 24 hour  Intake 6024.05 ml  Output 9020 ml  Net -2995.95 ml      Physical Exam   CVP 8  General:No resp difficulty HEENT: ETT   Neck: supple. JVP difficult to assessno JVD. Carotids 2+ bilat; no bruits. No lymphadenopathy or thryomegaly appreciated. Cor: PMI nondisplaced. Regular rate & rhythm. No rubs, gallops or murmurs. Lungs: clear Abdomen: soft, nontender, nondistended. No hepatosplenomegaly. No bruits or masses. Good bowel sounds. Extremities: no cyanosis, clubbing, rash, edema.  Neuro: Awake on the vent. MAE   Telemetry   NSR 90s f  EKG    NSR, RBBB (personally reviewed)  Labs    CBC Recent Labs    05/19/20 1818 05/19/20 2306 05/21/20 0205 05/21/20 0523  05/22/20 0250  WBC 18.1*   < > 12.5*  --  13.2*  NEUTROABS 12.2*  --   --   --   --   HGB 18.8*   < > 12.2* 11.2* 11.9*  HCT 61.2*   < > 35.5* 33.0* 34.9*  MCV 95.5   < > 85.7  --  87.0  PLT 279   < > 126*  --  138*   < > = values in this interval not displayed.   Basic Metabolic Panel Recent Labs    05/21/20 1414 05/21/20 2106 05/22/20 0250  NA 137 133* 132*  K 3.4* 3.4* 3.7  CL 98 96* 95*  CO2 _0 GLUCOSE 140* 134* 152*  BUN 30* 31* 31*  CREATININE 2.87* 2.83* 2.86*  CALCIUM 6.5* 6.4* 6.5*  MG 2.0 1.9  --   PHOS 4.0  --   --    Liver Function Tests Recent Labs    05/21/20 0205 05/22/20 0250  AST 131* 68*  ALT 174* 134*  ALKPHOS 40 52  BILITOT 0.7 1.0  PROT 4.3* 4.7*  ALBUMIN 2.2* 2.4*   No results for input(s): LIPASE, AMYLASE in the last 72 hours. Cardiac Enzymes No results for input(s): CKTOTAL, CKMB, CKMBINDEX, TROPONINI in the last 72 hours.  BNP: BNP (last 3 results) No results for input(s): BNP in the last 8760 hours.  ProBNP (last 3 results) No results for input(s): PROBNP in the last 8760 hours.   D-Dimer No results for input(s): DDIMER in the last 72 hours. Hemoglobin A1C No results for input(s): HGBA1C in the last 72 hours. Fasting Lipid Panel Recent Labs    05/20/20 0527  CHOL 110  HDL 18*  LDLCALC 60  TRIG 160*  CHOLHDL 6.1   Thyroid Function Tests No results for input(s): TSH, T4TOTAL, T3FREE, THYROIDAB in the last 72 hours.  Invalid input(s): FREET3  Other results:   Imaging    No results found.   Medications:     Scheduled Medications: . acetaminophen  500 mg Per Tube Q8H  . aspirin  81 mg Per Tube Daily  . atorvastatin  80 mg Per Tube Daily  . chlorhexidine gluconate (MEDLINE KIT)  15 mL Mouth Rinse BID  . Chlorhexidine Gluconate Cloth  6 each Topical Daily  . Chlorhexidine Gluconate Cloth  6 each Topical Daily  . clopidogrel  75 mg Per Tube Q breakfast  . docusate  100 mg Per Tube BID  . fentaNYL  (SUBLIMAZE) injection  50 mcg Intravenous Once  . hydrocortisone sod succinate (SOLU-CORTEF) inj  100 mg Intravenous Q12H  . mouth rinse  15 mL Mouth Rinse 10 times per day  . pantoprazole sodium  40 mg Per Tube Daily  . polyethylene glycol  17 g Per Tube Daily  . sodium chloride flush  10-40 mL Intracatheter Q12H  . sodium chloride flush  3 mL Intravenous Q12H    Infusions: . sodium chloride 10 mL/hr at 05/22/20 0700  . amiodarone 30 mg/hr (05/22/20 0700)  . cefTRIAXone (ROCEPHIN)  IV Stopped (05/22/20 0313)  . DOBUTamine 10 mcg/kg/min (05/22/20 0700)  . epinephrine 10 mcg/min (05/22/20 0700)  . fentaNYL infusion INTRAVENOUS 50 mcg/hr (05/22/20 0700)  . furosemide (LASIX) 200 mg in dextrose 5% 100 mL (60m/mL) infusion 8 mg/hr (05/22/20 0729)  . insulin 7 mL/hr at 05/22/20 0700  . metronidazole Stopped (05/22/20 0447)  . midazolam Stopped (05/22/20 0500)  . norepinephrine (LEVOPHED) Adult infusion 21 mcg/min (05/22/20 0700)  . potassium chloride 10 mEq (05/22/20 0703)  . vasopressin 0.04 Units/min (05/22/20 0728)    PRN Medications: sodium chloride, acetaminophen, dextrose, fentaNYL, midazolam, nitroGLYCERIN, ondansetron (ZOFRAN) IV, sodium chloride flush, sodium chloride flush  Assessment/Plan   1. Shock: Primarily vasodilatory shock at this point.  SVR 537 on FloTrac with CI 3.4, co-ox 72%. Lactate down to 2.7 last check.  Suspect vasodilatory shock due to prolonged hypotension with arrest and tissue ischemia. Echo with LV EF in the 45% range with mildly dilated RV but function appeared relatively normal.  Weight down 6 pounds. CO-OX stable.  Cut back dobutamine to 5 mcg, try to wean drips as able. Remains on epinephrine 105, NE 21,  vasopressin 0.04.  - Continue hydrocortisone per CCM.  - Do not think mechanical support would be helpful with good cardiac index and low SVR.  - Continue lasix drip.  2. CAD: Patient appears to have CTO of the mid RCA with good collaterals.  I do  not think this was acute MI from plaque rupture.  HS-TnI significantly elevated, but think this was generalized ischemia from hypotension with prolonged cardiac arrest.  - Continue ASA/Plavx.  - Continue statin.  - Off  heparin gtt.  3. Cardiac arrest: VF arrest with immediate CPR but prolonged time to ROSC (about 45 minutes).   - amiodarone gtt  30 mg/hr.  4. AKI: Creatinine peaked  3.06-->2.86 .  Suspect ATN in setting of prolonged cardiac arrest.  - Continue  Lasix gtt today.   5. Acute hypoxemic respiratory failure: CXR with bilateral upper lobe infiltrates likely due to aspiration with arrest.  - Vent per CCM.  - ceftriaxone/Flagyl for aspiration coverage.  6. Neuro: Following commands.  7. Elevated LFTs: Relatively mild considering down-time.  Shock liver.  Trending down.  8. Diabetes: Insulin.   Length of Stay: 3  Amy Clegg, NP  05/22/2020, 7:36 AM  Advanced Heart Failure Team Pager [phone] (M-F; 7a - 5p)  Please contact [facility] for night-coverage after hours (5p -7a ) and weekends on [email or web address]  Patient seen with NP, agree with the above note.   He is down 6 lbs, CVP 12-13. Good diuresis on Lasix gtt 8 mg/hr.  Creatinine trending down 2.86 today.  LFTs trending down. He remains on dobutamine 10, epinephrine 10, NE 21, vasopressin 0.04.  He is in NSR on amiodarone. Co-ox 73% with good CI on FloTrak.   Diffuse bilateral infiltrates on CXR, he remains on ceftriaxone/Flagyl.  FiO2 0.7.   General: NAD, awake on vent Neck: Thick, JVP 12+, no thyromegaly or thyroid nodule.  Lungs: Decreased at bases CV: Nondisplaced PMI.  Heart regular S1/S2, no S3/S4, no murmur. 2+ edema to thighs. Abdomen: Soft, nontender, no hepatosplenomegaly, no distention.  Skin: Intact without lesions or rashes.  Neurologic: Awake/alert, follows commands.  Extremities: No clubbing or cyanosis.  HEENT: Normal.   Continue Lasix gtt at 8 mg/hr and replace K.  Remains volume overloaded.   Continue  abx for aspiration PNA.   Wean down pressors, start with epinephrine.  Decrease dobutamine to 5.   Continue amiodarone gtt.  If he recovers, will need ICD.   Stop heparin gtt, start prophylactic Lovenox.   Doing significantly better.   CRITICAL CARE Performed by: Loralie Champagne  Total critical care time: 40 minutes  Critical care time was exclusive of separately billable procedures and treating other patients.  Critical care was necessary to treat or prevent imminent or life-threatening deterioration.  Critical care was time spent personally by me on the following activities: development of treatment plan with patient and/or surrogate as well as nursing, discussions with consultants, evaluation of patient's response to treatment, examination of patient, obtaining history from patient or surrogate, ordering and performing treatments and interventions, ordering and review of laboratory studies, ordering and review of radiographic studies, pulse oximetry and re-evaluation of patient's condition.  Loralie Champagne 05/22/2020 7:57 AM

## 2020-05-22 NOTE — Progress Notes (Signed)
Nutrition Follow Up  DOCUMENTATION CODES:   Not applicable  INTERVENTION:   Tube feeding:  -Vital AF 1.2 @ 20 ml/hr via OG -Increase by 10 ml Q8 hours to goal rate of 60 ml/hr (1440 ml) -ProSource TF 45 ml QID  Provides: 1888 kcals, 152 grams protein, 1168 ml free water.  NUTRITION DIAGNOSIS:   Increased nutrient needs related to acute illness as evidenced by estimated needs.  Ongoing  GOAL:   Patient will meet greater than or equal to 90% of their needs   Addressed via TF   MONITOR:   Weight trends,Labs,I & O's,Vent status,Skin,TF tolerance  REASON FOR ASSESSMENT:   Ventilator    ASSESSMENT:   Patient with PMH significant for DM, GERD, colonic polyps, s/p total hip replacement, HLD, and HTN. Presents this admission with VF cardiac arrest.   Pt discussed during ICU rounds and with RN.   Weaning pressors. Off Giapressa. Continues to follow commands. Had BM this am. Abdomen a little less firm. Okay to start feeding per CCM. Xray confirms OG located in the stomach. Titrate to goal slowly.   Admission weight: 140.5 kg  Current weight: 147.5 kg   Patient remains intubated on ventilator support MV: 14.4 L/min Temp (24hrs), Avg:99.8 F (37.7 C), Min:98.9 F (37.2 C), Max:100.4 F (38 C)  UOP: 9020 ml x 24 hrs   Drips: 200 mg lasix in D5 @ 4 ml/hr, insulin, levophed, vasopressin Medications: colace, solucortef, miralax, 20 mEq KCl BID,  Labs: Na 132 (L) CBG 135-172  Diet Order:   Diet Order            Diet NPO time specified  Diet effective now                 EDUCATION NEEDS:   Not appropriate for education at this time  Skin:  Skin Assessment: Reviewed RN Assessment  Last BM:  5/20  Height:   Ht Readings from Last 1 Encounters:  05/20/20 5' 10.98" (1.803 m)    Weight:   Wt Readings from Last 1 Encounters:  05/22/20 (!) 147.5 kg    BMI:  Body mass index is 45.37 kg/m.  Estimated Nutritional Needs:   Kcal:  6948-5462  kcal  Protein:  150-185 grams  Fluid:  >/= 1.5 L/day  Mariana Single RD, LDN Clinical Nutrition Pager listed in [place]

## 2020-05-22 NOTE — Progress Notes (Signed)
[facility] Progress Note Patient Name: [name] DOB: Dec 12, 1955 MRN: [id number]   Date of Service  05/22/2020  HPI/Events of Note  Hypokalemia - K+ = 3.7 and creatinine = 2.86. Patient is on a Lasix IV infusion.   eICU Interventions  Will replace K+.     Intervention Category Major Interventions: Electrolyte abnormality - evaluation and management  Brittanee Ghazarian [place] 05/22/2020, 3:54 AM

## 2020-05-22 NOTE — Progress Notes (Signed)
NAME:  [name], MRN:  [id number], DOB:  [birth year], LOS: 3 ADMISSION DATE:  05/19/2020, CONSULTATION DATE: 05/19/2020 REFERRING MD: Dina Rich - ED [facility]ac[facility] Pontes, CHIEF COMPLAINT: Cardiogenic shock  History of Present Illness:  65 year old man with PMHx significant for HTN, HLD, T2DM, GERD, chronic pain, RBBB and first degree AV block who presented with chest pain at [facility].  OOH VF arrest with immediate bystander CPR and EMS activation with > 45 minutes total CPR. Post arrest, felt to have inferior wall STEMI on EKG. Intubated, sedated and transferred to [facility] for potential PCI.  On arrival to [facility], EKG no longer concerning for inferior wall STEMI.  At that time, patient was awake and following commands on sedation. Initial plan to admit patient as NSTEMI and proceed to angiography in the morning following medical optimization.  Overnight 5/17, patient became hypotensive to SBP 80s with emesis x 1. Repeat EKG demonstrated marked ST elevation in the inferior leads. Patient was taken to cath lab where mid-RCA lesion (total occlusion) was noted but not stented (suspect chronic lesion due to adequate collateral flow). Patient returned to CVICU. Heparin and DAPT initiated for ACS. Remained hypotensive requiring initiation of multiple vasopressors for management of distributive shock.  Pertinent Medical History   Past Medical History:  Diagnosis Date  . Adenomatous polyp   . Cataract    left eye for sure   . Chronic pain   . Diabetes mellitus   . GERD (gastroesophageal reflux disease)   . History of colonic polyps   . History of hip replacement, total   . Hx of anaphylactic shock   . Hyperlipidemia   . Hypertension   . Left anterior hemiblock   . Obesity   . RBBB (right bundle branch block with left anterior fascicular block)   . 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 5/17 Admitted to [facility] as transfer from [place]. S/p VF arrest with 45 mins CPR before ROSC. Left femoral line inserted. C/f inferior STEMI. Coronary angiography with PCI RCA lesion, not stented, thought to be chronic . 5/18 Remains hypotensive on multiple pressors. LA and troponins downtrending. Weaning Giapreza. SVR and CO remain suboptimal . 5/19 Weaning DBA, Giapreza. Starting lasix gtt. Able to follow commands. Had an episode of VF, self-aborted. Was given additional amiodarone. Off Giapressa  Interim History / Subjective:   Off gipressa Continues on dobutamine, epi, vasopressin and norepinephrine Pressor need is improving  Objective   Blood pressure 136/77, pulse 89, temperature 99.7 F (37.6 C), temperature source Oral, resp. rate 16, height 5' 10.98" (1.803 m), weight (!) 147.5 kg, SpO2 100 %. CVP:  [7 mmHg-23 mmHg] 8 mmHg  Vent Mode: PRVC FiO2 (%):  [70 %-80 %] 70 % Set Rate:  [24 bmp] 24 bmp Vt Set:  [600 mL] 600 mL PEEP:  [10 cmH20] 10 cmH20 Plateau Pressure:  [19 cmH20-30 cmH20] 24 cmH20   Intake/Output Summary (Last 24 hours) at 05/22/2020 0350 Last data filed at 05/22/2020 0800 Gross per 24 hour  Intake 6024.05 ml  Output 9520 ml  Net -3495.95 ml   Filed Weights   05/20/20 1109 05/21/20 0600 05/22/20 0500  Weight: (!) 140.5 kg (!) 150.5 kg (!) 147.5 kg   Physical Examination: Blood pressure 136/77, pulse 89, temperature 99.7 F (37.6 C), temperature source Oral, resp. rate 16, height 5' 10.98" (1.803  m), weight (!) 147.5 kg, SpO2 100 %. Gen:      No acute distress HEENT:  EOMI, sclera anicteric Neck:     No masses; no thyromegaly Lungs:    Clear to auscultation bilaterally; normal respiratory effort CV:         Regular rate and rhythm; no murmurs Abd:      + bowel sounds; soft, non-tender; no palpable masses, no distension Ext:    No edema; adequate peripheral perfusion Skin:      Warm and dry; no rash Neuro: alert and oriented x 3 Psych:  normal mood and affect  Labs/imaging that I have personally reviewed: (right click and "Reselect all SmartList Selections" daily)  Labs significant for BUN/creatinine 31/2.86 AST 68, ALT 134 Creatinine stable at 2.86  No new imaging   Resolved Hospital Problem list   None  Assessment & Plan:  Acute hypoxic respiratory failure secondary to acute pulmonary edema requiring mechanical ventilation ARDS due to aspiration pneumonitis- improving Concern for aspiration PNA Continue vent support, SBT's as tolerated Ceftriaxone, Flagyl for right lower lobe pneumonia.  Day 3 of 7  Cardiogenic shock secondary to acute coronary syndrome likely involving the inferior wall requiring titration of vasoactive infusions VF cardiac arrest Echo LVEF 40-45%. S/p cath lab where mid-RCA lesion (total occlusion) was noted but not stented, suspected to be chronic due to well-developed collaterals. Dobutamine dose is reducing. Off giapressa Wean other pressors as tolerated Dual antiplatelet therapy, Off heparin gtt Amiodarone drip Continue atorvastatin  Distributive shock with concern for mesenteric ischemia (in the setting of profound hypotension) and global hypoxia reperfusion injury Weaning pressors Lasix drip  Uncontrolled type 2 diabetes Continue insulin drip  AKI On Lasix drip.  Monitor renal function Replete potassium  Elevated transaminases from shock liver, improving Monitor LFTs  Hypertension Holding antihypertensives  Best practice (right click and "Reselect all SmartList Selections" daily)  Diet:  NPO Pain/Anxiety/Delirium protocol (if indicated): Yes (RASS goal -1) - Versed and Fentanyl infusions VAP protocol (if indicated): Yes DVT prophylaxis: LMWH and Systemic AC GI prophylaxis: PPI Glucose control:  Insulin gtt Central venous access:  Yes, and it is still needed Arterial line:  Yes, and it is still needed Foley:  Yes, and it is still needed Mobility:  bed rest  PT  consulted: N/A Last date of multidisciplinary goals of care discussion [wife updated at bedside 5/19] Code Status:  DNR Disposition: ICU  The patient is critically ill with multiple organ system failure and requires high complexity decision making for assessment and support, frequent evaluation and titration of therapies, advanced monitoring, review of radiographic studies and interpretation of complex data.   Critical Care Time devoted to patient care services, exclusive of separately billable procedures, described in this note is 35 minutes.   Marshell Garfinkel MD [place] See Amion for pager  If no response to pager , please call [phone] until 7pm After 7:00 pm call Elink  [phone] 05/22/2020, 8:49 AM

## 2020-05-23 ENCOUNTER — Inpatient Hospital Stay (HOSPITAL_COMMUNITY): Payer: No Typology Code available for payment source

## 2020-05-23 DIAGNOSIS — I469 Cardiac arrest, cause unspecified: Secondary | ICD-10-CM | POA: Diagnosis not present

## 2020-05-23 DIAGNOSIS — N179 Acute kidney failure, unspecified: Secondary | ICD-10-CM | POA: Diagnosis not present

## 2020-05-23 LAB — POCT I-STAT 7, (LYTES, BLD GAS, ICA,H+H)
Acid-Base Excess: 9 mmol/L — ABNORMAL HIGH (ref 0.0–2.0)
Acid-Base Excess: 10 mmol/L — ABNORMAL HIGH (ref 0.0–2.0)
Bicarbonate: 30.9 mmol/L — ABNORMAL HIGH (ref 20.0–28.0)
Bicarbonate: 32.5 mmol/L — ABNORMAL HIGH (ref 20.0–28.0)
Calcium, Ion: 0.85 mmol/L — CL (ref 1.15–1.40)
Calcium, Ion: 0.91 mmol/L — ABNORMAL LOW (ref 1.15–1.40)
HCT: 32 % — ABNORMAL LOW (ref 39.0–52.0)
HCT: 33 % — ABNORMAL LOW (ref 39.0–52.0)
Hemoglobin: 10.9 g/dL — ABNORMAL LOW (ref 13.0–17.0)
Hemoglobin: 11.2 g/dL — ABNORMAL LOW (ref 13.0–17.0)
O2 Saturation: 96 %
O2 Saturation: 99 %
Patient temperature: 98.2
Patient temperature: 98.3
Potassium: 3.1 mmol/L — ABNORMAL LOW (ref 3.5–5.1)
Potassium: 4 mmol/L (ref 3.5–5.1)
Sodium: 135 mmol/L (ref 135–145)
Sodium: 134 mmol/L — ABNORMAL LOW (ref 135–145)
TCO2: 32 mmol/L (ref 22–32)
TCO2: 34 mmol/L — ABNORMAL HIGH (ref 22–32)
pCO2 arterial: 33.2 mmHg (ref 32.0–48.0)
pCO2 arterial: 33.1 mmHg (ref 32.0–48.0)
pH, Arterial: 7.577 — ABNORMAL HIGH (ref 7.350–7.450)
pH, Arterial: 7.6 — ABNORMAL HIGH (ref 7.350–7.450)
pO2, Arterial: 66 mmHg — ABNORMAL LOW (ref 83.0–108.0)
pO2, Arterial: 122 mmHg — ABNORMAL HIGH (ref 83.0–108.0)

## 2020-05-23 LAB — CBC
HCT: 33.3 % — ABNORMAL LOW (ref 39.0–52.0)
Hemoglobin: 11 g/dL — ABNORMAL LOW (ref 13.0–17.0)
MCH: 29 pg (ref 26.0–34.0)
MCHC: 33 g/dL (ref 30.0–36.0)
MCV: 87.9 fL (ref 80.0–100.0)
Platelets: 128 10*3/uL — ABNORMAL LOW (ref 150–400)
RBC: 3.79 MIL/uL — ABNORMAL LOW (ref 4.22–5.81)
RDW: 14.7 % (ref 11.5–15.5)
WBC: 9.4 10*3/uL (ref 4.0–10.5)
nRBC: 0 % (ref 0.0–0.2)

## 2020-05-23 LAB — GLUCOSE, CAPILLARY
Glucose-Capillary: 213 mg/dL — ABNORMAL HIGH (ref 70–99)
Glucose-Capillary: 215 mg/dL — ABNORMAL HIGH (ref 70–99)
Glucose-Capillary: 216 mg/dL — ABNORMAL HIGH (ref 70–99)
Glucose-Capillary: 230 mg/dL — ABNORMAL HIGH (ref 70–99)
Glucose-Capillary: 237 mg/dL — ABNORMAL HIGH (ref 70–99)
Glucose-Capillary: 257 mg/dL — ABNORMAL HIGH (ref 70–99)
Glucose-Capillary: 267 mg/dL — ABNORMAL HIGH (ref 70–99)

## 2020-05-23 LAB — COOXEMETRY PANEL
Carboxyhemoglobin: 1.4 % (ref 0.5–1.5)
Methemoglobin: 1.2 % (ref 0.0–1.5)
O2 Saturation: 63.9 %
Total hemoglobin: 11.6 g/dL — ABNORMAL LOW (ref 12.0–16.0)

## 2020-05-23 LAB — BASIC METABOLIC PANEL
Anion gap: 12 (ref 5–15)
BUN: 41 mg/dL — ABNORMAL HIGH (ref 8–23)
CO2: 28 mmol/L (ref 22–32)
Calcium: 7 mg/dL — ABNORMAL LOW (ref 8.9–10.3)
Chloride: 93 mmol/L — ABNORMAL LOW (ref 98–111)
Creatinine, Ser: 2.67 mg/dL — ABNORMAL HIGH (ref 0.61–1.24)
GFR, Estimated: 26 mL/min — ABNORMAL LOW (ref >60)
Glucose, Bld: 224 mg/dL — ABNORMAL HIGH (ref 70–99)
Potassium: 3.1 mmol/L — ABNORMAL LOW (ref 3.5–5.1)
Sodium: 133 mmol/L — ABNORMAL LOW (ref 135–145)

## 2020-05-23 LAB — PHOSPHORUS
Phosphorus: 3.2 mg/dL (ref 2.5–4.6)
Phosphorus: 3.3 mg/dL (ref 2.5–4.6)

## 2020-05-23 MED ORDER — POTASSIUM CHLORIDE 10 MEQ/50ML IV SOLN
10.0000 meq | INTRAVENOUS | Status: AC
  Administered 2020-05-23 (×6): 10 meq via INTRAVENOUS
  Filled 2020-05-23 (×6): qty 50

## 2020-05-23 MED ORDER — HYDROCORTISONE NA SUCCINATE PF 100 MG IJ SOLR
50.0000 mg | Freq: Two times a day (BID) | INTRAMUSCULAR | Status: DC
  Administered 2020-05-23 – 2020-05-24 (×2): 50 mg via INTRAVENOUS
  Filled 2020-05-23 (×2): qty 2

## 2020-05-23 MED ORDER — POTASSIUM CHLORIDE 20 MEQ PO PACK
40.0000 meq | PACK | Freq: Two times a day (BID) | ORAL | Status: DC
  Administered 2020-05-23 – 2020-05-24 (×2): 40 meq
  Filled 2020-05-23 (×2): qty 2

## 2020-05-23 NOTE — Progress Notes (Signed)
[place] Progress Note Patient Name: [name] DOB: [birth year] MRN: [id number]   Date of Service  05/23/2020  HPI/Events of Note  Hypokalemia - K+ = 3.1 and creatinine = 2.67. Patient is on a Lasix IV infusion.   eICU Interventions  Will replace K+.     Intervention Category Major Interventions: Electrolyte abnormality - evaluation and management  Lysle Dingwall 05/23/2020, 6:38 AM

## 2020-05-23 NOTE — Progress Notes (Signed)
NAME:  [name], MRN:  [id number], DOB:  06/02/1955, LOS: 4 ADMISSION DATE:  05/19/2020, CONSULTATION DATE: 05/19/2020 REFERRING MD: Dina Rich - ED Zac[facility] Pontes, CHIEF COMPLAINT: Cardiogenic shock  History of Present Illness:  65 year old man with PMHx significant for HTN, HLD, T2DM, GERD, chronic pain, RBBB and first degree AV block who presented with chest pain at [facility].  OOH VF arrest with immediate bystander CPR and EMS activation with > 45 minutes total CPR. Post arrest, felt to have inferior wall STEMI on EKG. Intubated, sedated and transferred to [facility] for potential PCI.  On arrival to [facility], EKG no longer concerning for inferior wall STEMI.  At that time, patient was awake and following commands on sedation. Initial plan to admit patient as NSTEMI and proceed to angiography in the morning following medical optimization.  Overnight 5/17, patient became hypotensive to SBP 80s with emesis x 1. Repeat EKG demonstrated marked ST elevation in the inferior leads. Patient was taken to cath lab where mid-RCA lesion (total occlusion) was noted but not stented (suspect chronic lesion due to adequate collateral flow). Patient returned to CVICU. Heparin and DAPT initiated for ACS. Remained hypotensive requiring initiation of multiple vasopressors for management of distributive shock.  Pertinent Medical History   Past Medical History:  Diagnosis Date  . Adenomatous polyp   . Cataract    left eye for sure   . Chronic pain   . Diabetes mellitus   . GERD (gastroesophageal reflux disease)   . History of colonic polyps   . History of hip replacement, total   . Hx of anaphylactic shock   . Hyperlipidemia   . Hypertension   . Left anterior hemiblock   . Obesity   . RBBB (right bundle branch block with left anterior fascicular block)   . 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 5/17 Admitted to [facility] as transfer from [place]. S/p VF arrest with 45 mins CPR before ROSC. Left femoral line inserted. C/f inferior STEMI. Coronary angiography with PCI RCA lesion, not stented, thought to be chronic . 5/18 Remains hypotensive on multiple pressors. LA and troponins downtrending. Weaning Giapreza. SVR and CO remain suboptimal . 5/19 Weaning DBA, Giapreza. Starting lasix gtt. Able to follow commands. Had an episode of VF, self-aborted. Was given additional amiodarone. Off Giapressa . 5/21 bleeding  Interim History / Subjective:   Continues on dobutamine, epi and Neo-Synephrine are off  Objective   Blood pressure 136/77, pulse 72, temperature 98.2 F (36.8 C), temperature source Oral, resp. rate (!) 24, height 5' 10.98" (1.803 m), weight (!) 147.5 kg, SpO2 100 %. CVP:  [0 mmHg-12 mmHg] 8 mmHg  Vent Mode: PRVC FiO2 (%):  [40 %-50 %] 50 % Set Rate:  [24 bmp] 24 bmp Vt Set:  [600 mL] 600 mL PEEP:  [10 cmH20] 10 cmH20 Plateau Pressure:  [19 cmH20-29 cmH20] 26 cmH20   Intake/Output Summary (Last 24 hours) at 05/23/2020 0859 Last data filed at 05/23/2020 0800 Gross per 24 hour  Intake 2958.79 ml  Output 12630 ml  Net -9671.21 ml   Filed Weights   05/20/20 1109 05/21/20 0600 05/22/20 0500  Weight: (!) 140.5 kg (!) 150.5 kg (!) 147.5 kg   Physical Examination: Blood pressure 136/77, pulse 72, temperature 98.2 F (36.8 C), temperature source Oral, resp. rate (!) 24, height 5' 10.98" (1.803  m), weight (!) 147.5 kg, SpO2 100 %. Gen:      No acute distress HEENT:  EOMI, sclera anicteric Neck:     No masses; no thyromegaly, ET tube Lungs:    Clear to auscultation bilaterally; normal respiratory effort CV:         Regular rate and rhythm; no murmurs Abd:      + bowel sounds; soft, non-tender; no palpable masses, no distension Ext:    No edema; adequate peripheral perfusion Skin:      Warm and dry; no rash Neuro: Awake, responds Labs/imaging that I have  personally reviewed: (right click and "Reselect all SmartList Selections" daily)   Potassium 3.1, BUN/creatinine 21/4.67 No new imaging  Resolved Hospital Problem list   None  Assessment & Plan:  Acute hypoxic respiratory failure secondary to acute pulmonary edema requiring mechanical ventilation ARDS due to aspiration pneumonitis- improving Concern for aspiration PNA Continue vent support, SBT's as tolerated Ceftriaxone, Flagyl for right lower lobe pneumonia.  Day 4 of 7  Cardiogenic shock secondary to acute coronary syndrome likely involving the inferior wall requiring titration of vasoactive infusions VF cardiac arrest Echo LVEF 40-45%. S/p cath lab where mid-RCA lesion (total occlusion) was noted but not stented, suspected to be chronic due to well-developed collaterals. Wean pressors Dual antiplatelet therapy, Off heparin gtt Amiodarone drip Continue atorvastatin  Distributive shock with concern for mesenteric ischemia (in the setting of profound hypotension) and global hypoxia reperfusion injury Weaning pressors Lasix drip  Uncontrolled type 2 diabetes Off insulin drip, SSI, Levemir  AKI On Lasix drip.  Monitor renal function Replete potassium  Elevated transaminases from shock liver, improving Monitor LFTs  Hypertension Holding antihypertensives  Best practice (right click and "Reselect all SmartList Selections" daily)  Diet:  NPO Pain/Anxiety/Delirium protocol (if indicated): Yes (RASS goal -1) - Versed and Fentanyl infusions VAP protocol (if indicated): Yes DVT prophylaxis: LMWH and Systemic AC GI prophylaxis: PPI Glucose control:  Insulin gtt Central venous access:  Yes, and it is still needed Arterial line:  Yes, and it is still needed Foley:  Yes, and it is still needed Mobility:  bed rest  PT consulted: N/A Last date of multidisciplinary goals of care discussion [wife updated at bedside 5/19] Code Status:  DNR Disposition: ICU  The patient is  critically ill with multiple organ system failure and requires high complexity decision making for assessment and support, frequent evaluation and titration of therapies, advanced monitoring, review of radiographic studies and interpretation of complex data.   Critical Care Time devoted to patient care services, exclusive of separately billable procedures, described in this note is 35 minutes.   Marshell Garfinkel MD [place] [facility] See Amion for pager  If no response to pager , please call [phone] until 7pm After 7:00 pm call Elink  [phone] 05/23/2020, 8:59 AM

## 2020-05-23 NOTE — Progress Notes (Signed)
Patient ID: [name], male   DOB: May 16, 1955, 65 y.o.   MRN: [id number]     Advanced Heart Failure Rounding Note  PCP-Cardiologist: Sinclair Grooms, MD   Subjective:    Co-ox 64% this morning.  Remains on dobutamine 5, vasopressin 0.04. Now off NE and epinephrine.   Creatinine 3.1>2.9>2.67.  Marked UOP with weight down 6 lbs.  On Lasix gtt 8 mg/hr, CVP 10 this morning.   He remains on amiodarone 30, in NSR.   He is on ceftriaxone/Flagyl for aspiration coverage.    Following commands.    Objective:   Weight Range: (!) 147.5 kg Body mass index is 45.37 kg/m.   Vital Signs:   Temp:  [96.8 F (36 C)-99.8 F (37.7 C)] 98.2 F (36.8 C) (05/21 0812) Pulse Rate:  [72-91] 72 (05/21 0330) Resp:  [12-29] 24 (05/21 0700) SpO2:  [90 %-100 %] 100 % (05/21 0822) Arterial Line BP: (88-151)/(43-72) 136/56 (05/21 0700) FiO2 (%):  [40 %-50 %] 50 % (05/21 0822) Last BM Date: 05/22/20  Weight change: Filed Weights   05/20/20 1109 05/21/20 0600 05/22/20 0500  Weight: (!) 140.5 kg (!) 150.5 kg (!) 147.5 kg    Intake/Output:   Intake/Output Summary (Last 24 hours) at 05/23/2020 0926 Last data filed at 05/23/2020 0800 Gross per 24 hour  Intake 2958.79 ml  Output 12230 ml  Net -9271.21 ml      Physical Exam   CVP 10 General: intubated/sedated Neck: Thick, JVP difficult, no thyromegaly or thyroid nodule.  Lungs: Crackles at bases.  CV: Nonpalpable PMI.  Heart regular S1/S2, no S3/S4, no murmur.  1+ ankle edema.  Abdomen: Soft, nontender, no hepatosplenomegaly, no distention.  Skin: Intact without lesions or rashes.  Neurologic: Will wake up and follow commands.  Extremities: No clubbing or cyanosis.  HEENT: Normal.    Telemetry   NSR 90s (personally reviewed)   Labs    CBC Recent Labs    05/22/20 0250 05/23/20 0430 05/23/20 0433  WBC 13.2*  --  9.4  HGB 11.9* 10.9* 11.0*  HCT 34.9* 32.0* 33.3*  MCV 87.0  --  87.9  PLT 138*  --  308*   Basic Metabolic  Panel Recent Labs    05/21/20 2106 05/22/20 0250 05/22/20 1327 05/22/20 1634 05/23/20 0430 05/23/20 0433  NA 133* 132*  --   --  135 133*  K 3.4* 3.7  --   --  3.1* 3.1*  CL 96* 95*  --   --   --  93*  CO2 30 28  --   --   --  28  GLUCOSE 134* 152*  --   --   --  224*  BUN 31* 31*  --   --   --  41*  CREATININE 2.83* 2.86*  --   --   --  2.67*  CALCIUM 6.4* 6.5*  --   --   --  7.0*  MG 1.9  --  2.0  --   --   --   PHOS  --   --   --  3.4  --  3.2   Liver Function Tests Recent Labs    05/21/20 0205 05/22/20 0250  AST 131* 68*  ALT 174* 134*  ALKPHOS 40 52  BILITOT 0.7 1.0  PROT 4.3* 4.7*  ALBUMIN 2.2* 2.4*   No results for input(s): LIPASE, AMYLASE in the last 72 hours. Cardiac Enzymes No results for input(s): CKTOTAL, CKMB, CKMBINDEX, TROPONINI in the  last 72 hours.  BNP: BNP (last 3 results) No results for input(s): BNP in the last 8760 hours.  ProBNP (last 3 results) No results for input(s): PROBNP in the last 8760 hours.   D-Dimer No results for input(s): DDIMER in the last 72 hours. Hemoglobin A1C No results for input(s): HGBA1C in the last 72 hours. Fasting Lipid Panel No results for input(s): CHOL, HDL, LDLCALC, TRIG, CHOLHDL, LDLDIRECT in the last 72 hours. Thyroid Function Tests No results for input(s): TSH, T4TOTAL, T3FREE, THYROIDAB in the last 72 hours.  Invalid input(s): FREET3  Other results:   Imaging    No results found.   Medications:     Scheduled Medications: . aspirin  81 mg Per Tube Daily  . atorvastatin  80 mg Per Tube Daily  . chlorhexidine gluconate (MEDLINE KIT)  15 mL Mouth Rinse BID  . Chlorhexidine Gluconate Cloth  6 each Topical Daily  . Chlorhexidine Gluconate Cloth  6 each Topical Daily  . clopidogrel  75 mg Per Tube Q breakfast  . docusate  100 mg Per Tube BID  . enoxaparin (LOVENOX) injection  70 mg Subcutaneous Q24H  . feeding supplement (PROSource TF)  45 mL Per Tube QID  . fentaNYL (SUBLIMAZE) injection   50 mcg Intravenous Once  . hydrocortisone sod succinate (SOLU-CORTEF) inj  100 mg Intravenous Q12H  . insulin aspart  0-20 Units Subcutaneous Q4H  . insulin detemir  30 Units Subcutaneous BID  . mouth rinse  15 mL Mouth Rinse 10 times per day  . pantoprazole sodium  40 mg Per Tube Daily  . polyethylene glycol  17 g Per Tube Daily  . potassium chloride  20 mEq Per Tube BID  . sodium chloride flush  10-40 mL Intracatheter Q12H  . sodium chloride flush  3 mL Intravenous Q12H    Infusions: . sodium chloride Stopped (05/23/20 0649)  . amiodarone 30 mg/hr (05/23/20 0700)  . cefTRIAXone (ROCEPHIN)  IV Stopped (05/23/20 0016)  . dexmedetomidine (PRECEDEX) IV infusion 0.6 mcg/kg/hr (05/23/20 0830)  . DOBUTamine 5 mcg/kg/min (05/23/20 0700)  . epinephrine Stopped (05/22/20 0946)  . feeding supplement (VITAL AF 1.2 CAL) 30 mL/hr at 05/23/20 0700  . fentaNYL infusion INTRAVENOUS 250 mcg/hr (05/23/20 0700)  . furosemide (LASIX) 200 mg in dextrose 5% 100 mL ($Remov'2mg'HkAELn$ /mL) infusion 8 mg/hr (05/23/20 0700)  . metronidazole Stopped (05/23/20 0253)  . norepinephrine (LEVOPHED) Adult infusion 5 mcg/min (05/23/20 0700)  . potassium chloride 10 mEq (05/23/20 0802)  . vasopressin 0.03 Units/min (05/23/20 0700)    PRN Medications: sodium chloride, acetaminophen, fentaNYL, ketotifen, midazolam, nitroGLYCERIN, ondansetron (ZOFRAN) IV, polyvinyl alcohol, sodium chloride flush, sodium chloride flush  Assessment/Plan   1. Shock: Primarily vasodilatory shock, suspect due to prolonged hypotension with arrest and tissue ischemia. Echo with LV EF in the 45% range with mildly dilated RV but function appeared relatively normal.  Weight down 6 pounds. CO-OX 64%. Excellent UOP, I/Os markedly negative.  CVP 10.  - Cut back dobutamine to 2.5 mcg/kg/min - Vasopressin is only pressor left currently, will try to wean off today.   - Wean hydrocortisone  - Do not think mechanical support would be helpful with good cardiac index  and low SVR.  - Decrease Lasix gtt to 6 mg/hr.    2. CAD: Patient appears to have CTO of the mid RCA with good collaterals.  I do not think this was acute MI from plaque rupture.  HS-TnI significantly elevated, but think this was generalized ischemia from hypotension with prolonged  cardiac arrest.  - Continue ASA/Plavx.  - Continue statin.  - Off heparin gtt.  3. Cardiac arrest: VF arrest with immediate CPR but prolonged time to ROSC (about 45 minutes).   - amiodarone gtt  30 mg/hr.  - Will need ICD if continues to recover.  4. AKI: Creatinine peaked  3.06-->2.86-->2.67.  Suspect ATN in setting of prolonged cardiac arrest.  - Decrease Lasix gtt with marked diuresis and rising BUN.  5. Acute hypoxemic respiratory failure: CXR with bilateral upper lobe infiltrates likely due to aspiration with arrest.  - Vent per CCM.  - Needs CXR today.  - ceftriaxone/Flagyl for aspiration coverage.  6. Neuro: Following commands.  7. Elevated LFTs: Relatively mild considering down-time.  Shock liver.  Trending down.  8. Diabetes: Insulin.   CRITICAL CARE Performed by: Loralie Champagne  Total critical care time: 35 minutes  Critical care time was exclusive of separately billable procedures and treating other patients.  Critical care was necessary to treat or prevent imminent or life-threatening deterioration.  Critical care was time spent personally by me on the following activities: development of treatment plan with patient and/or surrogate as well as nursing, discussions with consultants, evaluation of patient's response to treatment, examination of patient, obtaining history from patient or surrogate, ordering and performing treatments and interventions, ordering and review of laboratory studies, ordering and review of radiographic studies, pulse oximetry and re-evaluation of patient's condition.  Loralie Champagne 05/23/2020 9:26 AM

## 2020-05-24 ENCOUNTER — Encounter (HOSPITAL_COMMUNITY): Payer: Self-pay | Admitting: Pulmonary Disease

## 2020-05-24 ENCOUNTER — Other Ambulatory Visit: Payer: Self-pay

## 2020-05-24 ENCOUNTER — Inpatient Hospital Stay (HOSPITAL_COMMUNITY): Payer: No Typology Code available for payment source

## 2020-05-24 DIAGNOSIS — I469 Cardiac arrest, cause unspecified: Secondary | ICD-10-CM | POA: Diagnosis not present

## 2020-05-24 DIAGNOSIS — R570 Cardiogenic shock: Secondary | ICD-10-CM | POA: Diagnosis not present

## 2020-05-24 LAB — COOXEMETRY PANEL
Carboxyhemoglobin: 1.1 % (ref 0.5–1.5)
Methemoglobin: 1.3 % (ref 0.0–1.5)
O2 Saturation: 62.9 %
Total hemoglobin: 12 g/dL (ref 12.0–16.0)

## 2020-05-24 LAB — POCT I-STAT 7, (LYTES, BLD GAS, ICA,H+H)
Acid-Base Excess: 7 mmol/L — ABNORMAL HIGH (ref 0.0–2.0)
Bicarbonate: 28.7 mmol/L — ABNORMAL HIGH (ref 20.0–28.0)
Calcium, Ion: 0.92 mmol/L — ABNORMAL LOW (ref 1.15–1.40)
HCT: 34 % — ABNORMAL LOW (ref 39.0–52.0)
Hemoglobin: 11.6 g/dL — ABNORMAL LOW (ref 13.0–17.0)
O2 Saturation: 97 %
Patient temperature: 100
Potassium: 3.1 mmol/L — ABNORMAL LOW (ref 3.5–5.1)
Sodium: 135 mmol/L (ref 135–145)
TCO2: 30 mmol/L (ref 22–32)
pCO2 arterial: 32.3 mmHg (ref 32.0–48.0)
pH, Arterial: 7.559 — ABNORMAL HIGH (ref 7.350–7.450)
pO2, Arterial: 81 mmHg — ABNORMAL LOW (ref 83.0–108.0)

## 2020-05-24 LAB — BASIC METABOLIC PANEL
Anion gap: 12 (ref 5–15)
Anion gap: 12 (ref 5–15)
BUN: 56 mg/dL — ABNORMAL HIGH (ref 8–23)
BUN: 67 mg/dL — ABNORMAL HIGH (ref 8–23)
CO2: 27 mmol/L (ref 22–32)
CO2: 25 mmol/L (ref 22–32)
Calcium: 7.2 mg/dL — ABNORMAL LOW (ref 8.9–10.3)
Calcium: 7.2 mg/dL — ABNORMAL LOW (ref 8.9–10.3)
Chloride: 95 mmol/L — ABNORMAL LOW (ref 98–111)
Chloride: 97 mmol/L — ABNORMAL LOW (ref 98–111)
Creatinine, Ser: 2.34 mg/dL — ABNORMAL HIGH (ref 0.61–1.24)
Creatinine, Ser: 2.19 mg/dL — ABNORMAL HIGH (ref 0.61–1.24)
GFR, Estimated: 30 mL/min — ABNORMAL LOW (ref >60)
GFR, Estimated: 32 mL/min — ABNORMAL LOW (ref >60)
Glucose, Bld: 252 mg/dL — ABNORMAL HIGH (ref 70–99)
Glucose, Bld: 249 mg/dL — ABNORMAL HIGH (ref 70–99)
Potassium: 3.1 mmol/L — ABNORMAL LOW (ref 3.5–5.1)
Potassium: 3.7 mmol/L (ref 3.5–5.1)
Sodium: 134 mmol/L — ABNORMAL LOW (ref 135–145)
Sodium: 134 mmol/L — ABNORMAL LOW (ref 135–145)

## 2020-05-24 LAB — CBC
HCT: 34.7 % — ABNORMAL LOW (ref 39.0–52.0)
Hemoglobin: 11.6 g/dL — ABNORMAL LOW (ref 13.0–17.0)
MCH: 29 pg (ref 26.0–34.0)
MCHC: 33.4 g/dL (ref 30.0–36.0)
MCV: 86.8 fL (ref 80.0–100.0)
Platelets: 145 10*3/uL — ABNORMAL LOW (ref 150–400)
RBC: 4 MIL/uL — ABNORMAL LOW (ref 4.22–5.81)
RDW: 14.6 % (ref 11.5–15.5)
WBC: 7.9 10*3/uL (ref 4.0–10.5)
nRBC: 0 % (ref 0.0–0.2)

## 2020-05-24 LAB — GLUCOSE, CAPILLARY
Glucose-Capillary: 175 mg/dL — ABNORMAL HIGH (ref 70–99)
Glucose-Capillary: 233 mg/dL — ABNORMAL HIGH (ref 70–99)
Glucose-Capillary: 233 mg/dL — ABNORMAL HIGH (ref 70–99)
Glucose-Capillary: 244 mg/dL — ABNORMAL HIGH (ref 70–99)
Glucose-Capillary: 252 mg/dL — ABNORMAL HIGH (ref 70–99)

## 2020-05-24 LAB — POTASSIUM: Potassium: 3.5 mmol/L (ref 3.5–5.1)

## 2020-05-24 LAB — PHOSPHORUS: Phosphorus: 3.3 mg/dL (ref 2.5–4.6)

## 2020-05-24 LAB — MAGNESIUM: Magnesium: 1.7 mg/dL (ref 1.7–2.4)

## 2020-05-24 MED ORDER — BLISTEX MEDICATED EX OINT
TOPICAL_OINTMENT | CUTANEOUS | Status: DC | PRN
  Filled 2020-05-24: qty 6.3

## 2020-05-24 MED ORDER — ORAL CARE MOUTH RINSE
15.0000 mL | Freq: Two times a day (BID) | OROMUCOSAL | Status: DC
  Administered 2020-05-24 – 2020-06-02 (×16): 15 mL via OROMUCOSAL

## 2020-05-24 MED ORDER — INSULIN DETEMIR 100 UNIT/ML ~~LOC~~ SOLN
34.0000 [IU] | Freq: Two times a day (BID) | SUBCUTANEOUS | Status: DC
  Administered 2020-05-24 – 2020-05-29 (×10): 34 [IU] via SUBCUTANEOUS
  Filled 2020-05-24 (×12): qty 0.34

## 2020-05-24 MED ORDER — FUROSEMIDE 10 MG/ML IJ SOLN
40.0000 mg | Freq: Once | INTRAMUSCULAR | Status: AC
  Administered 2020-05-24: 40 mg via INTRAVENOUS
  Filled 2020-05-24: qty 4

## 2020-05-24 MED ORDER — HYDROCORTISONE NA SUCCINATE PF 100 MG IJ SOLR
50.0000 mg | Freq: Two times a day (BID) | INTRAMUSCULAR | Status: DC
  Administered 2020-05-24 (×2): 50 mg via INTRAVENOUS
  Filled 2020-05-24 (×2): qty 2

## 2020-05-24 MED ORDER — POTASSIUM CHLORIDE 10 MEQ/50ML IV SOLN
10.0000 meq | INTRAVENOUS | Status: AC
  Administered 2020-05-24: 10 meq via INTRAVENOUS
  Filled 2020-05-24: qty 50

## 2020-05-24 MED ORDER — FENTANYL CITRATE (PF) 100 MCG/2ML IJ SOLN
50.0000 ug | INTRAMUSCULAR | Status: DC | PRN
  Administered 2020-05-24: 100 ug via INTRAVENOUS
  Administered 2020-05-25 (×2): 50 ug via INTRAVENOUS
  Filled 2020-05-24 (×3): qty 2

## 2020-05-24 MED ORDER — POTASSIUM CHLORIDE 10 MEQ/100ML IV SOLN
10.0000 meq | INTRAVENOUS | Status: DC
  Administered 2020-05-24: 10 meq via INTRAVENOUS
  Filled 2020-05-24: qty 100

## 2020-05-24 MED ORDER — ACETAMINOPHEN 160 MG/5ML PO SOLN: 650.0000 mg | ORAL | Status: DC | PRN

## 2020-05-24 MED ORDER — MAGNESIUM SULFATE IN D5W 1-5 GM/100ML-% IV SOLN
1.0000 g | Freq: Once | INTRAVENOUS | Status: AC
  Administered 2020-05-24: 1 g via INTRAVENOUS
  Filled 2020-05-24: qty 100

## 2020-05-24 MED ORDER — POTASSIUM CHLORIDE 20 MEQ PO PACK
40.0000 meq | PACK | Freq: Once | ORAL | Status: AC
  Administered 2020-05-24: 40 meq via ORAL
  Filled 2020-05-24: qty 2

## 2020-05-24 NOTE — Progress Notes (Addendum)
Patient ID: [name], male   DOB: 01-04-1955, 65 y.o.   MRN: [id number]     Advanced Heart Failure Rounding Note  PCP-Cardiologist: Belva Crome III, MD   Subjective:    Co-ox 63% this morning.  Remains on dobutamine 2.5, vasopressin 0.04. Now off NE and epinephrine.   Creatinine 3.1>2.9>2.67>2.34 but BUN higher.  Marked UOP with weight down.  On Lasix gtt 6 mg/hr, CVP 8-9 this morning.   He remains on amiodarone 30, in NSR.   He is on ceftriaxone/Flagyl for aspiration coverage.    Sedated currently on Precedex due to agitation overnight.    Objective:   Weight Range: 133.8 kg Body mass index is 41.16 kg/m.   Vital Signs:   Temp:  [97.7 F (36.5 C)-100 F (37.8 C)] 99.3 F (37.4 C) (05/22 0753) Pulse Rate:  [61-67] 66 (05/22 0800) Resp:  [21-24] 24 (05/22 0800) SpO2:  [99 %-100 %] 100 % (05/22 0800) Arterial Line BP: (105-143)/(46-59) 139/58 (05/22 0800) FiO2 (%):  [40 %-50 %] 40 % (05/22 0757) Weight:  [133.8 kg] 133.8 kg (05/22 0600) Last BM Date: 05/22/20  Weight change: Filed Weights   05/21/20 0600 05/22/20 0500 05/24/20 0600  Weight: (!) 150.5 kg (!) 147.5 kg 133.8 kg    Intake/Output:   Intake/Output Summary (Last 24 hours) at 05/24/2020 0902 Last data filed at 05/24/2020 0800 Gross per 24 hour  Intake 4237.4 ml  Output 6375 ml  Net -2137.6 ml      Physical Exam   CVP 8-9 General: Intubated/sedated.  Neck: Thick, JVP difficult, no thyromegaly or thyroid nodule.  Lungs: Decreased at bases. CV: Nondisplaced PMI.  Heart regular S1/S2, no S3/S4, no murmur.  Trace ankle edema.  Abdomen: Soft, nontender, no hepatosplenomegaly, no distention.  Skin: Intact without lesions or rashes.  Neurologic: Will follow commands per nursing.  Extremities: No clubbing or cyanosis.  HEENT: Normal.    Telemetry   NSR 90s (personally reviewed)   Labs    CBC Recent Labs    05/23/20 0433 05/23/20 1345 05/24/20 0358 05/24/20 0405  WBC 9.4  --   --   7.9  HGB 11.0*   < > 11.6* 11.6*  HCT 33.3*   < > 34.0* 34.7*  MCV 87.9  --   --  86.8  PLT 128*  --   --  145*   < > = values in this interval not displayed.   Basic Metabolic Panel Recent Labs    05/22/20 1327 05/22/20 1634 05/23/20 0433 05/23/20 1345 05/23/20 1653 05/24/20 0358 05/24/20 0405  NA  --    < > 133*   < >  --  135 134*  K  --    < > 3.1*   < >  --  3.1* 3.1*  CL  --   --  93*  --   --   --  95*  CO2  --   --  28  --   --   --  27  GLUCOSE  --   --  224*  --   --   --  252*  BUN  --   --  41*  --   --   --  56*  CREATININE  --   --  2.67*  --   --   --  2.34*  CALCIUM  --   --  7.0*  --   --   --  7.2*  MG 2.0  --   --   --   --   --  1.7  PHOS  --    < > 3.2  --  3.3  --  3.3   < > = values in this interval not displayed.   Liver Function Tests Recent Labs    05/22/20 0250  AST 68*  ALT 134*  ALKPHOS 52  BILITOT 1.0  PROT 4.7*  ALBUMIN 2.4*   No results for input(s): LIPASE, AMYLASE in the last 72 hours. Cardiac Enzymes No results for input(s): CKTOTAL, CKMB, CKMBINDEX, TROPONINI in the last 72 hours.  BNP: BNP (last 3 results) No results for input(s): BNP in the last 8760 hours.  ProBNP (last 3 results) No results for input(s): PROBNP in the last 8760 hours.   D-Dimer No results for input(s): DDIMER in the last 72 hours. Hemoglobin A1C No results for input(s): HGBA1C in the last 72 hours. Fasting Lipid Panel No results for input(s): CHOL, HDL, LDLCALC, TRIG, CHOLHDL, LDLDIRECT in the last 72 hours. Thyroid Function Tests No results for input(s): TSH, T4TOTAL, T3FREE, THYROIDAB in the last 72 hours.  Invalid input(s): FREET3  Other results:   Imaging    DG CHEST PORT 1 VIEW  Result Date: 05/23/2020 CLINICAL DATA:  Respirator dependent, evaluate tube placement. EXAM: PORTABLE CHEST 1 VIEW COMPARISON:  May 21, 2020. FINDINGS: Endotracheal tube approximately 8.3 cm above the carina, tip between clavicular heads. Previously 6.6 cm.  Unclear but difference may be projectional. Gastric tube coursing through in off the field of the radiograph. RIGHT-sided subclavian central venous line terminates at the caval to atrial junction as before. Cardiomediastinal contours are stable. Hilar structures grossly normal. Persistent retrocardiac opacity obscures the LEFT hemidiaphragm. Image rotated to the LEFT limiting assessment of the chest. The presumed LEFT effusion unchanged. Patchy airspace disease at the RIGHT lung base has improved. EKG leads project over the chest. On limited assessment no acute skeletal process. IMPRESSION: 1. Endotracheal tube approximately 8.3 cm above the carina, tip between the clavicular heads. Unclear difference but difference may be projectional. Could consider slight advancement as warranted though tubew position is within 1 cm of its position on May 19, 2020. 2. Gastric tube coursing through in off the field of the radiograph. 3. Persistent retrocardiac opacity and presumed LEFT effusion. 4. Improved aeration at the RIGHT lung base. Electronically Signed   By: Zetta Bills M.D.   On: 05/23/2020 11:56     Medications:     Scheduled Medications: . aspirin  81 mg Per Tube Daily  . atorvastatin  80 mg Per Tube Daily  . chlorhexidine gluconate (MEDLINE KIT)  15 mL Mouth Rinse BID  . Chlorhexidine Gluconate Cloth  6 each Topical Daily  . Chlorhexidine Gluconate Cloth  6 each Topical Daily  . clopidogrel  75 mg Per Tube Q breakfast  . docusate  100 mg Per Tube BID  . enoxaparin (LOVENOX) injection  70 mg Subcutaneous Q24H  . feeding supplement (PROSource TF)  45 mL Per Tube QID  . fentaNYL (SUBLIMAZE) injection  50 mcg Intravenous Once  . furosemide  40 mg Intravenous Once  . hydrocortisone sod succinate (SOLU-CORTEF) inj  50 mg Intravenous Q12H  . insulin aspart  0-20 Units Subcutaneous Q4H  . insulin detemir  30 Units Subcutaneous BID  . mouth rinse  15 mL Mouth Rinse 10 times per day  . pantoprazole  sodium  40 mg Per Tube Daily  . polyethylene glycol  17 g Per Tube Daily  . potassium chloride  40 mEq Per Tube BID  . potassium chloride  40 mEq Oral Once  . sodium chloride flush  10-40 mL Intracatheter Q12H  . sodium chloride flush  3 mL Intravenous Q12H    Infusions: . sodium chloride Stopped (05/24/20 0728)  . amiodarone 30 mg/hr (05/24/20 0800)  . cefTRIAXone (ROCEPHIN)  IV Stopped (05/24/20 0004)  . dexmedetomidine (PRECEDEX) IV infusion 1 mcg/kg/hr (05/24/20 0800)  . DOBUTamine 2.5 mcg/kg/min (05/24/20 0800)  . epinephrine Stopped (05/22/20 0946)  . feeding supplement (VITAL AF 1.2 CAL) 1,000 mL (05/24/20 0735)  . fentaNYL infusion INTRAVENOUS 250 mcg/hr (05/24/20 0800)  . metronidazole Stopped (05/24/20 0331)  . norepinephrine (LEVOPHED) Adult infusion Stopped (05/24/20 0001)  . potassium chloride 50 mL/hr at 05/24/20 0800  . vasopressin 0.03 Units/min (05/24/20 0800)    PRN Medications: sodium chloride, acetaminophen, fentaNYL, ketotifen, midazolam, nitroGLYCERIN, ondansetron (ZOFRAN) IV, polyvinyl alcohol, sodium chloride flush, sodium chloride flush  Assessment/Plan   1. Shock: Primarily vasodilatory shock, suspect due to prolonged hypotension with arrest and tissue ischemia. Echo with LV EF in the 45% range with mildly dilated RV but function appeared relatively normal.  Weight down again with excellent diuresis, CVP 8-9 with co-ox 64%. He is on dobutamine 2.5 + vasopressin 0.03 + Lasix gtt 6 mg/hr.    - Cut back dobutamine to 1 mcg/kg/min - Wean off vasopressin.   - Wean hydrocortisone to 25 mg q 12.  - Stop Lasix gtt, will give Lasix 40 mg IV x 1 this evening and replace K.  2. CAD: Patient appears to have CTO of the mid RCA with good collaterals.  I do not think this was acute MI from plaque rupture.  HS-TnI significantly elevated, but think this was generalized ischemia from hypotension with prolonged cardiac arrest.  - Continue ASA/Plavx.  - Continue statin.  -  Off heparin gtt (enoxaparin [place] prophylaxis).  3. Cardiac arrest: VF arrest with immediate CPR but prolonged time to ROSC (about 45 minutes).   - amiodarone gtt 30 mg/hr.  - Will need ICD if continues to recover.  4. AKI: Creatinine peaked  3.06-->2.86-->2.67-->2.34.  Suspect ATN in setting of prolonged cardiac arrest.  - Stopping Lasix gtt today.  5. Acute hypoxemic respiratory failure: CXR with bilateral upper lobe infiltrates likely due to aspiration with arrest.  - Vent per CCM.  - Repeat pCXR.  - ceftriaxone/Flagyl for aspiration coverage.  6. Neuro: Following commands.  7. Elevated LFTs: Relatively mild considering down-time.  Shock liver.  Trending down.  8. Diabetes: Insulin.  9. Thrombocytopenia: Mild, likely due to critical illness.  Improving.   Can remove femoral CVL.   CRITICAL CARE Performed by: Loralie Champagne  Total critical care time: 35 minutes  Critical care time was exclusive of separately billable procedures and treating other patients.  Critical care was necessary to treat or prevent imminent or life-threatening deterioration.  Critical care was time spent personally by me on the following activities: development of treatment plan with patient and/or surrogate as well as nursing, discussions with consultants, evaluation of patient's response to treatment, examination of patient, obtaining history from patient or surrogate, ordering and performing treatments and interventions, ordering and review of laboratory studies, ordering and review of radiographic studies, pulse oximetry and re-evaluation of patient's condition.  Loralie Champagne 05/24/2020 9:02 AM

## 2020-05-24 NOTE — Progress Notes (Signed)
eLink Physician-Brief Progress Note Patient Name: [name] DOB: [birth year] MRN: [id number]   Date of Service  05/24/2020  HPI/Events of Note  t extubated  1p, but still has 40 mEq KDur powder per tube ordered. Creat 2.19, Diuresing for fluid overload. Has CVC, no swallow study done yet. Last K 3.7 at 6.30 PM  eICU Interventions  Off of lasix gtt as per RN.  Will DC Potassium replacement scheduled. Follow labs in AM, replace if low via Intravenously.      Intervention Category Intermediate Interventions: Other:  Elmer Sow 05/24/2020, 9:28 PM

## 2020-05-24 NOTE — Procedures (Signed)
Extubation Procedure Note  Patient Details:   Name: [name] DOB: February 05, 1955 MRN: [id number]   Airway Documentation:    Vent end date: 05/24/20 Vent end time: 1254   Evaluation  O2 sats: stable throughout Complications: No apparent complications Patient did tolerate procedure well. Bilateral Breath Sounds: Clear,Diminished   Yes, Pt could speak post extubation.  Pt extubated to 4 l/m [place] per physician's order.  Earney Navy 05/24/2020, 12:55 PM

## 2020-05-24 NOTE — Progress Notes (Signed)
[facility] Progress Note Patient Name: [name] DOB: 04-Jan-1955 MRN: [id number]   Date of Service  05/24/2020  HPI/Events of Note  K low 3.1, Cr improving to 2.3 Mag at 1.7  eICU Interventions  Kcl total 40 meq IV, follow K level at noon Mag 1 gm IVPB once ordered.      Intervention Category Intermediate Interventions: Electrolyte abnormality - evaluation and management  Elmer Sow 05/24/2020, 5:17 AM

## 2020-05-24 NOTE — Progress Notes (Signed)
NAME:  [name], MRN:  Stephens, DOB:  Stephens, LOS: 5 ADMISSION DATE:  05/19/2020, CONSULTATION DATE: 05/19/2020 REFERRING MD: Dina Rich - ED [facility] Pontes, CHIEF COMPLAINT: Cardiogenic shock  History of Present Illness:  65 year old man with PMHx significant for HTN, HLD, T2DM, GERD, chronic pain, RBBB and first degree AV block who presented with chest pain at [facility].  OOH VF arrest with immediate bystander CPR and EMS activation with > 45 minutes total CPR. Post arrest, felt to have inferior wall STEMI on EKG. Intubated, sedated and transferred to [facility] for potential PCI.  On arrival to [facility], EKG no longer concerning for inferior wall STEMI.  At that time, patient was awake and following commands on sedation. Initial plan to admit patient as NSTEMI and proceed to angiography in the morning following medical optimization.  Overnight 5/17, patient became hypotensive to SBP 80s with emesis x 1. Repeat EKG demonstrated marked ST elevation in the inferior leads. Patient was taken to cath lab where mid-RCA lesion (total occlusion) was noted but not stented (suspect chronic lesion due to adequate collateral flow). Patient returned to CVICU. Heparin and DAPT initiated for ACS. Remained hypotensive requiring initiation of multiple vasopressors for management of distributive shock.  Pertinent Medical History   Past Medical History:  Diagnosis Date  . Adenomatous polyp   . Cataract    left eye for sure   . Chronic pain   . Diabetes mellitus   . GERD (gastroesophageal reflux disease)   . History of colonic polyps   . History of hip replacement, total   . Hx of anaphylactic shock   . Hyperlipidemia   . Hypertension   . Left anterior hemiblock   . Obesity   . RBBB (right bundle branch block with left anterior fascicular block)   . 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 5/17 Admitted to [facility] as transfer from [place]. S/p VF arrest with 45 mins CPR before ROSC. Left femoral line inserted. C/f inferior STEMI. Coronary angiography with PCI RCA lesion, not stented, thought to be chronic . 5/18 Remains hypotensive on multiple pressors. LA and troponins downtrending. Weaning Giapreza. SVR and CO remain suboptimal . 5/19 Weaning DBA, Giapreza. Starting lasix gtt. Able to follow commands. Had an episode of VF, self-aborted. Was given additional amiodarone. Off Giapressa . 5/22 Weaning off pressors  Interim History / Subjective:   Continues on dobutamine, off Levophed Diuresing well on Lasix  Objective   Blood pressure 136/77, pulse 73, temperature 99.3 F (37.4 C), temperature source Oral, resp. rate (!) 24, height 5' 10.98" (1.803 m), weight 133.8 kg, SpO2 100 %. CVP:  [5 mmHg-24 mmHg] 5 mmHg  Vent Mode: PRVC FiO2 (%):  [40 %-50 %] 40 % Set Rate:  [24 bmp] 24 bmp Vt Set:  [600 mL] 600 mL PEEP:  [10 cmH20] 10 cmH20 Plateau Pressure:  [24 cmH20-26 cmH20] 26 cmH20   Intake/Output Summary (Last 24 hours) at 05/24/2020 0948 Last data filed at 05/24/2020 0900 Gross per 24 hour  Intake 4422 ml  Output 6895 ml  Net -2473 ml   Filed Weights   05/21/20 0600 05/22/20 0500 05/24/20 0600  Weight: (!) 150.5 kg (!) 147.5 kg 133.8 kg   Physical Examination: Gen:      No acute distress HEENT:  EOMI, sclera anicteric, ETT Neck:  No masses; no thyromegaly Lungs:    Clear to auscultation bilaterally; normal respiratory effort CV:         Regular rate and rhythm; no murmurs Abd:      + bowel sounds; soft, non-tender; no palpable masses, no distension Ext:    No edema; adequate peripheral perfusion Skin:      Warm and dry; no rash Neuro: Sedated  Labs/imaging that I have personally reviewed: (right click and "Reselect all SmartList Selections" daily)   Potassium 3.1, creatinine 2.34 glucose elevated at 252, WBC 7.9, hemoglobin 11.6 Chest x-ray  pending  Resolved Hospital Problem list   None  Assessment & Plan:  Acute hypoxic respiratory failure secondary to acute pulmonary edema requiring mechanical ventilation ARDS due to aspiration pneumonitis- improving Concern for aspiration PNA Continue vent support, wean down PEEP Ceftriaxone, Flagyl for right lower lobe pneumonia.  Day 5 of 7  Cardiogenic shock secondary to acute coronary syndrome likely involving the inferior wall requiring titration of vasoactive infusions VF cardiac arrest Echo LVEF 40-45%. S/p cath lab where mid-RCA lesion (total occlusion) was noted but not stented, suspected to be chronic due to well-developed collaterals. Off Levophed.  Continue vasopressin Weaning stress dose steroid Continue amiodarone, statin  Distributive shock with concern for mesenteric ischemia (in the setting of profound hypotension) and global hypoxia reperfusion injury Weaning pressors  Uncontrolled type 2 diabetes Continue SSI, Levemir Increase Levemir dose  AKI Transition from Lasix to intermittent IV dosing Replete potassium  Elevated transaminases from shock liver, improving Monitor LFT  Hypertension Holding antihypertensive  Best practice (right click and "Reselect all SmartList Selections" daily)  Diet:  Tube Feed  Pain/Anxiety/Delirium protocol (if indicated): Yes (RASS goal -1) -Precedex VAP protocol (if indicated): Yes DVT prophylaxis: LMWH GI prophylaxis: PPI Glucose control:  SSI Yes and Basal insulin Yes Central venous access:  Yes, and it is still needed Arterial line:  Yes, and it is still needed Foley:  Yes, and it is still needed Mobility:  bed rest  PT consulted: N/A Last date of multidisciplinary goals of care discussion [wife updated at bedside 5/19] Code Status:  DNR Disposition: ICU  The patient is critically ill with multiple organ system failure and requires high complexity decision making for assessment and support, frequent evaluation and  titration of therapies, advanced monitoring, review of radiographic studies and interpretation of complex data.   Critical Care Time devoted to patient care services, exclusive of separately billable procedures, described in this note is 35 minutes.   Marshell Garfinkel MD [facility] See Amion for pager  If no response to pager , please call [phone] until 7pm After 7:00 pm call Elink  [phone] 05/24/2020, 10:15 AM

## 2020-05-25 DIAGNOSIS — I469 Cardiac arrest, cause unspecified: Secondary | ICD-10-CM | POA: Diagnosis not present

## 2020-05-25 DIAGNOSIS — R570 Cardiogenic shock: Secondary | ICD-10-CM | POA: Diagnosis not present

## 2020-05-25 LAB — COOXEMETRY PANEL
Carboxyhemoglobin: 1.5 % (ref 0.5–1.5)
Methemoglobin: 0.9 % (ref 0.0–1.5)
O2 Saturation: 57.4 %
Total hemoglobin: 12.1 g/dL (ref 12.0–16.0)

## 2020-05-25 LAB — MAGNESIUM: Magnesium: 2.1 mg/dL (ref 1.7–2.4)

## 2020-05-25 LAB — SURGICAL PCR SCREEN
MRSA, PCR: NEGATIVE
Staphylococcus aureus: POSITIVE — AB

## 2020-05-25 LAB — BASIC METABOLIC PANEL
Anion gap: 11 (ref 5–15)
Anion gap: 11 (ref 5–15)
BUN: 67 mg/dL — ABNORMAL HIGH (ref 8–23)
BUN: 66 mg/dL — ABNORMAL HIGH (ref 8–23)
CO2: 27 mmol/L (ref 22–32)
CO2: 27 mmol/L (ref 22–32)
Calcium: 7.3 mg/dL — ABNORMAL LOW (ref 8.9–10.3)
Calcium: 7.6 mg/dL — ABNORMAL LOW (ref 8.9–10.3)
Chloride: 99 mmol/L (ref 98–111)
Chloride: 101 mmol/L (ref 98–111)
Creatinine, Ser: 2.07 mg/dL — ABNORMAL HIGH (ref 0.61–1.24)
Creatinine, Ser: 1.86 mg/dL — ABNORMAL HIGH (ref 0.61–1.24)
GFR, Estimated: 35 mL/min — ABNORMAL LOW (ref >60)
GFR, Estimated: 39 mL/min — ABNORMAL LOW (ref >60)
Glucose, Bld: 199 mg/dL — ABNORMAL HIGH (ref 70–99)
Glucose, Bld: 113 mg/dL — ABNORMAL HIGH (ref 70–99)
Potassium: 3.4 mmol/L — ABNORMAL LOW (ref 3.5–5.1)
Potassium: 3.3 mmol/L — ABNORMAL LOW (ref 3.5–5.1)
Sodium: 137 mmol/L (ref 135–145)
Sodium: 139 mmol/L (ref 135–145)

## 2020-05-25 LAB — CBC
HCT: 34.8 % — ABNORMAL LOW (ref 39.0–52.0)
Hemoglobin: 11.5 g/dL — ABNORMAL LOW (ref 13.0–17.0)
MCH: 29.3 pg (ref 26.0–34.0)
MCHC: 33 g/dL (ref 30.0–36.0)
MCV: 88.5 fL (ref 80.0–100.0)
Platelets: 182 10*3/uL (ref 150–400)
RBC: 3.93 MIL/uL — ABNORMAL LOW (ref 4.22–5.81)
RDW: 14.7 % (ref 11.5–15.5)
WBC: 9.1 10*3/uL (ref 4.0–10.5)
nRBC: 0 % (ref 0.0–0.2)

## 2020-05-25 LAB — GLUCOSE, CAPILLARY
Glucose-Capillary: 112 mg/dL — ABNORMAL HIGH (ref 70–99)
Glucose-Capillary: 131 mg/dL — ABNORMAL HIGH (ref 70–99)
Glucose-Capillary: 169 mg/dL — ABNORMAL HIGH (ref 70–99)
Glucose-Capillary: 171 mg/dL — ABNORMAL HIGH (ref 70–99)
Glucose-Capillary: 173 mg/dL — ABNORMAL HIGH (ref 70–99)
Glucose-Capillary: 207 mg/dL — ABNORMAL HIGH (ref 70–99)

## 2020-05-25 LAB — PHOSPHORUS: Phosphorus: 4.5 mg/dL (ref 2.5–4.6)

## 2020-05-25 MED ORDER — PROSOURCE PLUS PO LIQD
30.0000 mL | Freq: Three times a day (TID) | ORAL | Status: DC
  Administered 2020-05-25 – 2020-05-27 (×4): 30 mL via ORAL
  Filled 2020-05-25 (×6): qty 30

## 2020-05-25 MED ORDER — OXYCODONE-ACETAMINOPHEN 5-325 MG PO TABS: 1.0000 | ORAL_TABLET | ORAL | Status: DC | PRN

## 2020-05-25 MED ORDER — HYDROCORTISONE NA SUCCINATE PF 100 MG IJ SOLR
50.0000 mg | Freq: Every day | INTRAMUSCULAR | Status: DC
  Administered 2020-05-25 – 2020-05-26 (×2): 50 mg via INTRAVENOUS
  Filled 2020-05-25 (×2): qty 2

## 2020-05-25 MED ORDER — PANTOPRAZOLE SODIUM 40 MG PO TBEC
40.0000 mg | DELAYED_RELEASE_TABLET | Freq: Every day | ORAL | Status: DC
  Administered 2020-05-25 – 2020-05-28 (×4): 40 mg via ORAL
  Filled 2020-05-25 (×4): qty 1

## 2020-05-25 MED ORDER — POTASSIUM CHLORIDE 10 MEQ/50ML IV SOLN
10.0000 meq | INTRAVENOUS | Status: AC
  Administered 2020-05-25 (×2): 10 meq via INTRAVENOUS
  Filled 2020-05-25 (×2): qty 50

## 2020-05-25 MED ORDER — ATORVASTATIN CALCIUM 80 MG PO TABS
80.0000 mg | ORAL_TABLET | Freq: Every day | ORAL | Status: DC
  Administered 2020-05-25 – 2020-06-02 (×9): 80 mg via ORAL
  Filled 2020-05-25 (×9): qty 1

## 2020-05-25 MED ORDER — CLOPIDOGREL BISULFATE 75 MG PO TABS
75.0000 mg | ORAL_TABLET | Freq: Every day | ORAL | Status: DC
  Administered 2020-05-25: 75 mg via ORAL
  Filled 2020-05-25: qty 1

## 2020-05-25 MED ORDER — NOREPINEPHRINE 4 MG/250ML-% IV SOLN: 0.0000 ug/min | INTRAVENOUS | Status: DC

## 2020-05-25 MED ORDER — FUROSEMIDE 10 MG/ML IJ SOLN
40.0000 mg | Freq: Two times a day (BID) | INTRAMUSCULAR | Status: AC
  Administered 2020-05-25 (×2): 40 mg via INTRAVENOUS
  Filled 2020-05-25 (×2): qty 4

## 2020-05-25 MED ORDER — ADULT MULTIVITAMIN W/MINERALS CH
1.0000 | ORAL_TABLET | Freq: Every day | ORAL | Status: DC
  Administered 2020-05-26 – 2020-06-02 (×8): 1 via ORAL
  Filled 2020-05-25 (×8): qty 1

## 2020-05-25 MED ORDER — POTASSIUM CHLORIDE CRYS ER 20 MEQ PO TBCR
40.0000 meq | EXTENDED_RELEASE_TABLET | ORAL | Status: AC
  Administered 2020-05-25 (×2): 40 meq via ORAL
  Filled 2020-05-25 (×2): qty 2

## 2020-05-25 MED ORDER — MUPIROCIN 2 % EX OINT
1.0000 "application " | TOPICAL_OINTMENT | Freq: Two times a day (BID) | CUTANEOUS | Status: AC
  Administered 2020-05-25 – 2020-05-30 (×10): 1 via NASAL
  Filled 2020-05-25 (×4): qty 22

## 2020-05-25 MED ORDER — OXYCODONE HCL 5 MG PO TABS
5.0000 mg | ORAL_TABLET | ORAL | Status: DC | PRN
  Administered 2020-05-25 – 2020-05-29 (×15): 10 mg via ORAL
  Administered 2020-05-30 (×2): 5 mg via ORAL
  Administered 2020-05-30 – 2020-06-02 (×9): 10 mg via ORAL
  Filled 2020-05-25 (×10): qty 2
  Filled 2020-05-25: qty 1
  Filled 2020-05-25 (×6): qty 2
  Filled 2020-05-25: qty 1
  Filled 2020-05-25 (×8): qty 2

## 2020-05-25 MED ORDER — ACETAMINOPHEN 325 MG PO TABS
650.0000 mg | ORAL_TABLET | ORAL | Status: DC | PRN
  Administered 2020-05-25 – 2020-05-31 (×3): 650 mg via ORAL
  Filled 2020-05-25 (×3): qty 2

## 2020-05-25 MED ORDER — POTASSIUM CHLORIDE 10 MEQ/100ML IV SOLN
10.0000 meq | INTRAVENOUS | Status: DC
  Administered 2020-05-25: 10 meq via INTRAVENOUS
  Filled 2020-05-25: qty 100

## 2020-05-25 MED ORDER — DOBUTAMINE IN D5W 4-5 MG/ML-% IV SOLN
2.5000 ug/kg/min | INTRAVENOUS | Status: DC
  Administered 2020-05-25: 2.5 ug/kg/min via INTRAVENOUS
  Filled 2020-05-25: qty 250

## 2020-05-25 MED ORDER — ASPIRIN 81 MG PO CHEW
81.0000 mg | CHEWABLE_TABLET | Freq: Every day | ORAL | Status: DC
  Administered 2020-05-25 – 2020-06-02 (×9): 81 mg via ORAL
  Filled 2020-05-25 (×9): qty 1

## 2020-05-25 MED ORDER — DOCUSATE SODIUM 100 MG PO CAPS
100.0000 mg | ORAL_CAPSULE | Freq: Two times a day (BID) | ORAL | Status: DC
  Administered 2020-05-29 – 2020-06-02 (×9): 100 mg via ORAL
  Filled 2020-05-25 (×12): qty 1

## 2020-05-25 MED ORDER — SODIUM CHLORIDE 0.9% FLUSH
3.0000 mL | Freq: Two times a day (BID) | INTRAVENOUS | Status: DC
Start: 2020-05-25 — End: 2020-06-02
  Administered 2020-05-25 – 2020-05-29 (×5): 3 mL via INTRAVENOUS

## 2020-05-25 NOTE — Progress Notes (Signed)
PT Cancellation Note  Patient Details Name: [name] MRN: [id number] DOB: [birth year]   Cancelled Treatment:    Reason Eval/Treat Not Completed: Patient not medically ready (pt with heart block with potential AICD per RN possibly today and not yet medically stable for mobility)   [name] 05/25/2020, 11:52 AM  Bayard Males, PT Acute Rehabilitation Services Pager: [phone] Office: [phone]

## 2020-05-25 NOTE — Progress Notes (Signed)
eLink Physician-Brief Progress Note Patient Name: [name] DOB: 19-Sep-1955 MRN: [id number]   Date of Service  05/25/2020  HPI/Events of Note  K 3.4, Cr > 2  eICU Interventions  Kcl 10 meq every hour via PIV or CVL x 3 doses.      Intervention Category Intermediate Interventions: Electrolyte abnormality - evaluation and management  Elmer Sow 05/25/2020, 5:59 AM

## 2020-05-25 NOTE — Consult Note (Addendum)
Cardiology Consultation:   Patient ID: [name] MRN: [id number]; DOB: January 18, 1955  Admit date: 05/19/2020 Date of Consult: 05/25/2020  PCP:  Clinic, [facility] Cardiologist:  Sinclair Grooms, MD   {     Patient Profile:   [name] is a 65 y.o. male with a hx of IDDM, HLD, HTN, HLD, who was admitted to [facility] 05/19/20 after a witnessed cardiac arrest  With bystander CPR is being seen 05/25/2020 for the evaluation of VF arrest/CHB at the request of Dr. Aundra Dubin.  History of Present Illness:   [name] is without any known prior cardiac hx,  In review of his record: suffered witnessed out of hospital cardiac arrest 5/17 w/ immediate bystander CPR, initiated by wife, and prompt activation of EMS.  Wife reported EMS shocking him apparently multiple times Per report, had prolonged CPR, more than 45 min prior to ROSC. Underlying rhythm was VFib. Post arrest EKG suggested possibility of inferior MI. Portable bedside echo showed inferobasal severe hypokinesis with septal bounce.  RV systolic function normal.  LVEF 40 to 50% range.  HS Trop 2566 > 9947 > 10906 > 9166 > 8258 > 7858 > 5624 K+ 4.3 > 3.0 > 2.7 > 2.7 > 2.6 > 2.5 > 2.9 >>>> 3.5 > 3.7 > 3.4  Mag 2.8 > 1.2 > 2.2 >>>> 2.1 Creat 1.29 >> 3.06 >>>> 2.07   Emergent cath showed 100% occlusion of the mid RCA, likely representing a chronic total occlusion given absence of thrombus staining in the area of occlusion and well-formed left-to-right collaterals. No other disease. LM, LAD and LCx all widely patent. No indication for PCI.  He had cardiogenic shock, HF team was brought on board requiring multiple pressors, electrolytes supported/replaced, was also on hydrocortisone Was intubated but did not require mechanical cardiac support Started and maintained on amiodarone gtt Started on ceftriaxone/Flagyl for aspiration coverage Echo with LV EF in the 45% range with mildly dilated RV but function  appeared relatively normal. Abn LFTs and AKI felt 2/2 prolonged arrest Lasix gtt with excellent UOP  Over the weekend started weaning off his pressors and he was extubated yesterday   This morning remained on on both levophed and vasoppressin This AM he had a 7 second episode of CHB w/V pause and placed back on dobutamine and his amiodarone and vasopressin gtts were stopped He remained ordered for the levophed though currently off with SBP 140's  [place]ox 57.4 this AM  LABS today K+ 3.4 BUN/Creat 67/2.07 Mag 2.1 WBC 9.2 H/H 11/34 Plts 182  05/22/20 AST 68 ALT 134   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    Past Surgical History:  Procedure Laterality Date   APPENDECTOMY     CERVICAL FUSION     CERVICAL LAMINECTOMY     COLONOSCOPY     CORONARY/GRAFT ACUTE MI REVASCULARIZATION N/A 05/19/2020   Procedure: Coronary/Graft Acute  MI Revascularization;  Surgeon: Belva Crome, MD;  Location: [place] [facility];  Service: Cardiovascular;  Laterality: N/A;   HAND SURGERY     HARDWARE REMOVAL Left 09/10/2012   Procedure: HARDWARE REMOVAL;  Surgeon: Jessy Oto, MD;  Location: [place];  Service: Orthopedics;  Laterality: Left;   HIP SURGERY  12/29/09   R THR  ([place])   HIP SURGERY     bilateral   LEFT HEART CATH AND CORONARY ANGIOGRAPHY N/A 05/19/2020   Procedure: LEFT HEART CATH AND CORONARY ANGIOGRAPHY;  Surgeon: Belva Crome, MD;  Location: [place] [facility];  Service: Cardiovascular;  Laterality: N/A;   TOTAL HIP ARTHROPLASTY Left 09/10/2012   Procedure:  REMOVAL OF HARDWARE LEFT PROXIMAL THIGH/FEMUR AND LEFT TOTAL HIP ARTHROPLASTY;  Surgeon: Jessy Oto, MD;  Location: [place];  Service: Orthopedics;  Laterality: Left;   TOTAL KNEE ARTHROPLASTY  01/2008   left knee     Home Medications:  Prior to Admission medications   Medication Sig Start Date End Date Taking? Authorizing Provider  aspirin EC 81 MG tablet Take 81 mg by mouth daily. Swallow whole.   Yes [provider]  atorvastatin (LIPITOR) 40 MG tablet Take 1 tablet (40 mg total) by mouth daily. 05/04/16  Yes Marletta Lor, MD  carboxymethylcellulose (REFRESH PLUS) 0.5 % SOLN Place 1 drop into both eyes 3 (three) times daily as needed (dry eyes).   Yes [provider]  cyclobenzaprine (FLEXERIL) 10 MG tablet Take 10 mg by mouth 3 (three) times daily.   Yes [provider]  diclofenac sodium (VOLTAREN) 1 % GEL Apply 4 g topically 3 (three) times daily as needed (pain).   Yes [provider]  empagliflozin (JARDIANCE) 25 MG TABS tablet Take 12.5 mg by mouth daily.   Yes [provider]  gabapentin (NEURONTIN) 400 MG capsule Take 400 mg by mouth 4 (four) times daily.   Yes [provider]  Insulin Aspart FlexPen 100 UNIT/ML SOPN Inject 40-50 Units into the skin See admin instructions. Inject 40 units with breakfast and lunch, inject 50 units with evening meal   Yes [provider]  Insulin Detemir (LEVEMIR FLEXTOUCH) 100 UNIT/ML Pen 100 units at bedtime Patient taking differently: Inject 80 Units into the skin at bedtime. 05/25/16  Yes Marletta Lor, MD  lidocaine (LIDODERM) 5 % Place 1 patch onto the skin daily as needed (pain). 01/30/12  Yes [provider]  lisinopril-hydrochlorothiazide (PRINZIDE,ZESTORETIC) 20-25 MG tablet Take 1 tablet by mouth daily. 05/04/16  Yes Marletta Lor, MD  loratadine (CLARITIN) 10 MG tablet Take 10 mg by mouth daily.   Yes [provider]  Multiple Vitamins-Minerals  (MULTIVITAMIN WITH MINERALS) tablet Take 1 tablet by mouth daily.   Yes [provider]  Omega-3 Fatty Acids (FISH OIL) 1000 MG CAPS Take 2,000 mg by mouth daily.   Yes [provider]  pantoprazole (PROTONIX) 40 MG tablet Take 40 mg by mouth 2 (two) times daily before a meal.   Yes [provider]  amoxicillin-clavulanate (AUGMENTIN) 875-125 MG tablet TAKE ONE TABLET BY MOUTH ONE HOUR PRIOR TO DENTAL PROCEDURE THEN TAKE EVERY 12 HOURS FOR 2 DOSES Patient not taking: No sig reported 07/14/17   Jessy Oto, MD  Blood Glucose Monitoring Suppl (ONE TOUCH ULTRA SYSTEM KIT) W/DEVICE KIT 1 kit by Does not apply route once. 09/29/14   Marletta Lor, MD  Insulin Pen Needle (NOVOTWIST) 32G X 5 MM MISC USE  TO INJECT INSULIN THREE TIMES A DAY 12/23/14   Marletta Lor, MD  ONE [facility] ULTRA TEST test strip Check blood sugar daily and as needed 06/29/15   Marletta Lor, MD    Inpatient Medications: Scheduled Meds:  aspirin  81 mg Oral Daily   atorvastatin  80 mg Oral Daily   chlorhexidine gluconate (MEDLINE KIT)  15 mL Mouth Rinse BID   Chlorhexidine Gluconate Cloth  6 each Topical Daily   Chlorhexidine Gluconate Cloth  6 each Topical Daily   clopidogrel  75 mg Oral Q breakfast   docusate sodium  100 mg Oral BID   enoxaparin (LOVENOX) injection  70 mg Subcutaneous Q24H   hydrocortisone sod succinate (SOLU-CORTEF) inj  50 mg Intravenous Daily   insulin aspart  0-20 Units Subcutaneous Q4H   insulin detemir  34 Units Subcutaneous BID   mouth rinse  15 mL Mouth Rinse BID   pantoprazole sodium  40 mg Per Tube Daily   polyethylene glycol  17 g Per Tube Daily   sodium chloride flush  10-40 mL Intracatheter Q12H   sodium chloride flush  3 mL Intravenous Q12H   Continuous Infusions:  sodium chloride Stopped (05/25/20 0751)   cefTRIAXone (ROCEPHIN)  IV Stopped (05/25/20 0010)   dexmedetomidine (PRECEDEX) IV infusion 0.6 mcg/kg/hr (05/25/20 0800)   DOBUTamine  2.5 mcg/kg/min (05/25/20 0846)   metronidazole Stopped (05/25/20 0241)   norepinephrine (LEVOPHED) Adult infusion Stopped (05/25/20 0914)   potassium chloride 10 mEq (05/25/20 0913)   PRN Meds: sodium chloride, acetaminophen, fentaNYL (SUBLIMAZE) injection, ketotifen, lip balm, nitroGLYCERIN, ondansetron (ZOFRAN) IV, polyvinyl alcohol, sodium chloride flush, sodium chloride flush  Allergies:    Allergies  Allergen Reactions   Toradol [Ketorolac Tromethamine] Anaphylaxis   Codeine     REACTION: itch   Morphine And Related Nausea And Vomiting   Naproxen     REACTION: anaphylaxis    Social History:   Social History   Socioeconomic History   Marital status: Married    Spouse name: Not on file   Number of children: Not on file   Years of education: Not on file   Highest education level: Not on file  Occupational History   Not on file  Tobacco Use   Smoking status: Former Smoker    Packs/day: 1.50    Years: 42.00    Pack years: 63.00    Types: Cigarettes    Quit date: 09/03/2013    Years since quitting: 6.7   Smokeless tobacco: Former Systems developer    Types: Chew   Tobacco comment: We discussed the need to remain smoke free and to call me for help if he has the desire to smoke again.  Vaping Use   Vaping Use: Never used  Substance and Sexual Activity   Alcohol use: No    Alcohol/week: 0.0 standard drinks    Comment: recovering alcoholic    Drug use: No   Sexual activity: Not on file  Other Topics Concern   Not on file  Social History Narrative   Recovering alcoholic      Minister    Social Determinants of Health   Financial Resource Strain: Not on file  Food Insecurity: Not on file  Transportation Needs: Not on file  Physical Activity: Not on file  Stress: Not on file  Social Connections: Not on file  Intimate Partner Violence: Not on file    Family History:   Family History  Problem Relation Age of Onset   Cancer Mother  lung   Crohn's disease Father     Alcohol abuse Brother    Colon polyps Brother    Colon cancer Neg Hx      ROS:  Please see the history of present illness.  All other ROS reviewed and negative.     Physical Exam/Data:   Vitals:   05/25/20 0630 05/25/20 0700 05/25/20 0730 05/25/20 0800  BP:      Pulse: (!) 56 (!) 57 (!) 57 (!) 57  Resp: _0 Temp: 98.1 F (36.7 C)     TempSrc: Oral     SpO2: 98% 100% 100% 99%  Weight:      Height:        Intake/Output Summary (Last 24 hours) at 05/25/2020 8546 Last data filed at 05/25/2020 0800 Gross per 24 hour  Intake 2665.35 ml  Output 3490 ml  Net -824.65 ml   Last 3 Weights 05/24/2020 05/22/2020 05/21/2020  Weight (lbs) 294 lb 15.6 oz 325 lb 2.9 oz 331 lb 12.7 oz  Weight (kg) 133.8 kg 147.5 kg 150.5 kg     Body mass index is 41.16 kg/m.  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 Psych:  Normal affect, very pleasant and cooperative,   EKG:  The EKG was personally reviewed and demonstrates:    1st: ST 105bpm, RBBB, LAD, PVC ST 109bpm, RBBB, LAD, PVC ST 110, RBBB, LAD, PVC ST 122, RBBB, LAD Last: 05/21/20: SR 95bpm, icRBBB (narrower 117m), LAD  Old: 05/30/2012: SR, nonspecific IVCD, QRS 1128m LAD, PVCs    Telemetry:  Telemetry was personally reviewed and demonstrates:   SR 60's >> this AM had period of CHB with a 7 second V pause > mobitz one block, V rates 50's > dobutamine > SR 60's  Relevant CV Studies:  Result Date: 05/19/2020  100% occlusion of  the mid RCA, likely representing a chronic total occlusion given absence of thrombus staining in the area of occlusion and well-formed left-to-right collaterals.  Widely patent left main  Widely patent LAD  Widely patent circumflex  LVEDP 14 initially and following angiography and IV fluid 20 mmHg  RECOMMENDATIONS:  IV dobutamine to improve potential RV dysfunction.  IV fluids as tolerated  A central line will be placed in the unit to measure co-oximetry and get right heart filling pressures.  Continue Levophed, epinephrine, IV fentanyl, and intermittent Versed as needed.  Wean Levophed and epinephrine as tolerated.  Advanced heart failure team will help manage.  Critical care team, Dr. AgDoyne Keelanaging primarily.   05/20/2020: TTE IMPRESSIONS   1. Left ventricular ejection fraction, by estimation, is 40 to 45%. The  left ventricle has mildly decreased function. The left ventricle  demonstrates regional wall motion abnormalities (basal and mid  inferolateral suggestive of RCA disease). There is  mild concentric left ventricular hypertrophy. Left ventricular diastolic  parameters are indeterminate.   2. Right ventricular systolic function is hyperdynamic. The right  ventricular size is normal.   3. A small pericardial effusion is present.   4.  The mitral valve is grossly normal. No evidence of mitral valve  regurgitation.   5. The aortic valve was not well visualized. Aortic valve regurgitation  is not visualized.   6. Aortic dilatation noted. There is borderline dilatation of the aortic  root, measuring 38 mm.   7. The inferior vena cava is dilated in size with <50% respiratory  variability, suggesting right atrial pressure of 15 mmHg.   Laboratory Data:  High Sensitivity Troponin:   Recent Labs  Lab 05/20/20 0527 05/20/20 0842 05/20/20 1121 05/20/20 1240 05/20/20 1801  TROPONINIHS 10,906* 9,166* 8,258* 7,858* 5,624*     Chemistry Recent Labs  Lab 05/24/20 0405  05/24/20 1146 05/24/20 1859 05/25/20 0321  NA 134*  --  134* 137  K 3.1* 3.5 3.7 3.4*  CL 95*  --  97* 99  CO2 27  --  25 27  GLUCOSE 252*  --  249* 199*  BUN 56*  --  67* 67*  CREATININE 2.34*  --  2.19* 2.07*  CALCIUM 7.2*  --  7.2* 7.3*  GFRNONAA 30*  --  32* 35*  ANIONGAP 12  --  12 11    Recent Labs  Lab 05/20/20 1121 05/21/20 0205 05/22/20 0250  PROT 4.6* 4.3* 4.7*  ALBUMIN 2.5* 2.2* 2.4*  AST 249* 131* 68*  ALT 234* 174* 134*  ALKPHOS 39 40 52  BILITOT 0.8 0.7 1.0   Hematology Recent Labs  Lab 05/23/20 0433 05/23/20 1345 05/24/20 0358 05/24/20 0405 05/25/20 0321  WBC 9.4  --   --  7.9 9.1  RBC 3.79*  --   --  4.00* 3.93*  HGB 11.0*   < > 11.6* 11.6* 11.5*  HCT 33.3*   < > 34.0* 34.7* 34.8*  MCV 87.9  --   --  86.8 88.5  MCH 29.0  --   --  29.0 29.3  MCHC 33.0  --   --  33.4 33.0  RDW 14.7  --   --  14.6 14.7  PLT 128*  --   --  145* 182   < > = values in this interval not displayed.   BNPNo results for input(s): BNP, PROBNP in the last 168 hours.  DDimer No results for input(s): DDIMER in the last 168 hours.   Radiology/Studies:  DG CHEST PORT 1 VIEW Result Date: 05/24/2020 CLINICAL DATA:  Shortness of breath and hypotension. EXAM: PORTABLE CHEST 1 VIEW COMPARISON:  05/23/2020 FINDINGS: Cardiomegaly, pulmonary vascular congestion and mild interstitial again noted. An endotracheal tube with tip 6.2 cm above the carina, NG/OG tube entering the stomach, and RIGHT subclavian central venous catheter with tip overlying the LOWER SVC again noted. There is no evidence of pneumothorax. Mild bibasilar atelectasis is again noted. Little interval changes noted from the prior study. IMPRESSION: Unchanged appearance of the chest with cardiomegaly, pulmonary vascular congestion, probable mild interstitial edema and bibasilar atelectasis. Electronically Signed   By: Margarette Canada M.D.   On: 05/24/2020 12:04      Assessment and Plan:   1. VF arrest (no EKGs/tracings of  this)     Reportedly had prolonged CPR and multiple shocks 2. CAD 3. MI/ACS     100% RCA, felt to be CTO, not intervened      Cath as above     On ASA, statin, off hep gtt  3. CHB     Baseline conduction system disease w/ IVCD and LAD     Regained 1:1 on dobutamine   4. Respiratory  failure, ARDS     Extubated yesterday     On antibiotics, CXR with bilateral upper lobe infiltrates likely due to aspiration with arrest.   5. Pt is pleasant and follows commands appropriately     Seems to tend to ramble on a bit though, he does answer questions asked to him directly correctly.        D/w Dr. 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  Dr. Lovena Le has seen the patient and discussed case with Dr. Aundra Dubin.  Dr. Aundra Dubin does not think the patient is pressor dependent this AM with good BPs, states he was off all pressors and back on dobutamine solely for the CHB We will look to perhaps implanting dual chamber ICD today (possible left bundle pacing lead) pending on today's schedule.  For now, will keep NPO, if we go today will have central line removed      ADDENDUM: Telemetry reviewed without recurrent advanced heart block BP is stable on dobutamine at 2.5 only. Dr. Lovena Le made aware, Will plan for device implant tomorrow Plan to remove line tomorrow as well OK to resume diet today   Risk Assessment/Risk Scores:  { For questions or updates, please contact [facility] Please consult [email or web address] for contact info under    Signed, Baldwin Jamaica, PA-C  05/25/2020 9:23 AM  EP Attending  Patient seen and examined. Agree with the findings as noted above. The patient has had a VF arrest and CHB. Earlier  today on IV amio he had more heart block which has resolved with Dobutamine. He has mild LV dysfunction and an occluded RCA with left to right collaterals. He is morbidly obese and has some mild residual encephalopathy but appears to be progressing well. He denies anginal symptoms. His respiratory failure is improving. ICD insertion for secondary prevention is indicated. No reversible factors. He has stable stage 3 renal dysfunction. We will plan to place ICD insertion prior to DC.   Carleene Overlie Alyus Mofield,MD

## 2020-05-25 NOTE — Progress Notes (Signed)
NAME:  RAMIZ TURPIN, MRN:  [id number], DOB:  14-Mar-1955, LOS: 6 ADMISSION DATE:  05/19/2020, CONSULTATION DATE: 05/19/2020 REFERRING MD: Dina Rich - ED Zacarias Pontes, CHIEF COMPLAINT: Cardiogenic shock  History of Present Illness:  65 year old man with PMHx significant for HTN, HLD, T2DM, GERD, chronic pain, RBBB and first degree AV block who presented with chest pain at [facility].  OOH VF arrest with immediate bystander CPR and EMS activation with > 45 minutes total CPR. Post arrest, felt to have inferior wall STEMI on EKG. Intubated, sedated and transferred to [facility] for potential PCI.  On arrival to [facility], EKG no longer concerning for inferior wall STEMI.  At that time, patient was awake and following commands on sedation. Initial plan to admit patient as NSTEMI and proceed to angiography in the morning following medical optimization.  Overnight 5/17, patient became hypotensive to SBP 80s with emesis x 1. Repeat EKG demonstrated marked ST elevation in the inferior leads. Patient was taken to cath lab where mid-RCA lesion (total occlusion) was noted but not stented (suspect chronic lesion due to adequate collateral flow). Patient returned to CVICU. Heparin and DAPT initiated for ACS. Remained hypotensive requiring initiation of multiple vasopressors for management of distributive shock.  Pertinent Medical History   Past Medical History:  Diagnosis Date  . Adenomatous polyp   . Cataract    left eye for sure   . Chronic pain   . Diabetes mellitus   . GERD (gastroesophageal reflux disease)   . History of colonic polyps   . History of hip replacement, total   . Hx of anaphylactic shock   . Hyperlipidemia   . Hypertension   . Left anterior hemiblock   . Obesity   . RBBB (right bundle branch block with left anterior fascicular block)   . 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 5/17 Admitted to [facility] as transfer from [place]. S/p VF arrest with 45 mins CPR before ROSC. Left femoral line inserted. C/f inferior STEMI. Coronary angiography with PCI RCA lesion, not stented, thought to be chronic . 5/18 Remains hypotensive on multiple pressors. LA and troponins downtrending. Weaning Giapreza. SVR and CO remain suboptimal . 5/19 Weaning DBA, Giapreza. Starting lasix gtt. Able to follow commands. Had an episode of VF, self-aborted. Was given additional amiodarone. Off Giapressa . 5/22 Weaning off pressors, extubated  Interim History / Subjective:   Patient was extubated yesterday.  Remains on vasopressors.  Good urine output  Objective   Blood pressure 136/77, pulse (!) 57, temperature 98.1 F (36.7 C), temperature source Oral, resp. rate 19, height 5' 10.98" (1.803 m), weight 133.8 kg, SpO2 100 %. CVP:  [5 mmHg] 5 mmHg  Vent Mode: PSV;CPAP FiO2 (%):  [36 %-40 %] 36 % Set Rate:  [24 bmp] 24 bmp Vt Set:  [600 mL] 600 mL PEEP:  [10 cmH20] 10 cmH20 Pressure Support:  [5 cmH20-10 cmH20] 5 cmH20 Plateau Pressure:  [26 cmH20] 26 cmH20   Intake/Output Summary (Last 24 hours) at 05/25/2020 0738 Last data filed at 05/25/2020 0700 Gross per 24 hour  Intake 4092.17 ml  Output 4250 ml  Net -157.83 ml   Filed Weights   05/21/20 0600 05/22/20 0500 05/24/20 0600  Weight: (!) 150.5 kg (!) 147.5 kg 133.8 kg   Physical Examination: Gen:      Obese male chronically ill-appearing  resting in bed HEENT: Tracking appropriately, NCAT Lungs:    Clear to auscultation bilaterally CV:         Regular rate rhythm, S1-S2 Abd:   Bowel sounds present, soft nontender mildly distended, obese Ext: No significant edema Skin:   Warm dry Neuro: Alert oriented following commands  Labs/imaging that I have personally reviewed: (right click and "Reselect all SmartList Selections" daily)   Sodium 137, potassium 3.4 Serum creatinine 2.07 White blood cell count 9.1 Hemoglobin  11.5  Resolved Hospital Problem list   None  Assessment & Plan:   Acute hypoxic respiratory failure secondary to acute pulmonary edema requiring mechanical ventilation, now extubated 05/24/2020 ARDS due to aspiration pneumonitis- improving Plan: Complete 7-day course ceftriaxone plus Flagyl.  Stop date 05/27/2018  Cardiogenic shock secondary to acute coronary syndrome likely involving the inferior wall requiring titration of vasoactive infusions VF cardiac arrest Echo LVEF 40-45%. S/p cath lab where mid-RCA lesion (total occlusion) was noted but not stented, suspected to be chronic due to well-developed collaterals. Plan: Continue on Levophed plus vasopressin Maintain mean arterial pressure greater than 65 mmHg. Taper off of stress dose steroids, hydrocortisone 50 mg daily x3 days then stop  Distributive shock with concern for mesenteric ischemia (in the setting of profound hypotension) and global hypoxia reperfusion injury Supportive care Wean from pressors  Uncontrolled type 2 diabetes Plan: Continue SSI plus Levemir Discussed with pharmacy  AKI Positive cumulative fluid balance Plan: Transitioned from Lasix infusion to twice daily dosing. Follow electrolytes Follow urine output  Elevated transaminases from shock liver, improving Monitor LFTs Observe  Hypertension Holding antihypertensives while in shock  Best practice (right click and "Reselect all SmartList Selections" daily)  Diet:  Tube Feed  Pain/Anxiety/Delirium protocol (if indicated): Yes (RASS goal -1) -Precedex VAP protocol (if indicated): Yes DVT prophylaxis: LMWH GI prophylaxis: PPI Glucose control:  SSI Yes and Basal insulin Yes Central venous access:  Yes, and it is still needed Arterial line:  Yes, and it is still needed Foley:  Yes, and it is still needed Mobility:  bed rest  PT consulted: N/A Last date of multidisciplinary goals of care discussion [wife updated at bedside 5/19] Code Status:   DNR Disposition: ICU   This patient is critically ill with multiple organ system failure; which, requires frequent high complexity decision making, assessment, support, evaluation, and titration of therapies. This was completed through the application of advanced monitoring technologies and extensive interpretation of multiple databases. During this encounter critical care time was devoted to patient care services described in this note for 32 minutes.  Garner Nash, DO [facility] 05/25/2020 7:38 AM

## 2020-05-25 NOTE — Progress Notes (Addendum)
Patient ID: [name], male   DOB: [birth year], 65 y.o.   MRN: [id number]     Advanced Heart Failure Rounding Note  PCP-Cardiologist: Sinclair Grooms, MD   Subjective:   Extubated 5/22   Off all drips except vasopressin 0.03 units.   Creatinine peaked 3.1> today 2.07   He remains on amiodarone 30--bradycardia with brief 3rd degree .    He is on ceftriaxone for aspiration coverage.    Complaining of chest soreness.    Objective:   Weight Range: 133.8 kg Body mass index is 41.16 kg/m.   Vital Signs:   Temp:  [96.9 F (36.1 C)-99.5 F (37.5 C)] 98.1 F (36.7 C) (05/23 0630) Pulse Rate:  [56-74] 57 (05/23 0800) Resp:  [14-27] 19 (05/23 0800) SpO2:  [92 %-100 %] 99 % (05/23 0800) Arterial Line BP: (71-139)/(36-63) 118/53 (05/23 0800) FiO2 (%):  [36 %-40 %] 36 % (05/22 1254) Last BM Date: 05/24/20  Weight change: Filed Weights   05/21/20 0600 05/22/20 0500 05/24/20 0600  Weight: (!) 150.5 kg (!) 147.5 kg 133.8 kg    Intake/Output:   Intake/Output Summary (Last 24 hours) at 05/25/2020 2119 Last data filed at 05/25/2020 0800 Gross per 24 hour  Intake 2849.95 ml  Output 4010 ml  Net -1160.05 ml      Physical Exam  CVP 6-7  General:  . No resp difficulty HEENT: normal Neck: supple. no JVD. Carotids 2+ bilat; no bruits. No lymphadenopathy or thryomegaly appreciated. Cor: PMI nondisplaced. Regular rate & rhythm. No rubs, gallops or murmurs. Lungs: clear on 4 liters.  Abdomen: obese,  nontender, nondistended. No hepatosplenomegaly. No bruits or masses. Good bowel sounds. Extremities: no cyanosis, clubbing, rash, edema Neuro: alert & orientedx3, cranial nerves grossly intact. moves all 4 extremities w/o difficulty. Affect pleasant    Telemetry  NSR [place] Dragon -- brief 3rd degree    Labs    CBC Recent Labs    05/24/20 0405 05/25/20 0321  WBC 7.9 9.1  HGB 11.6* 11.5*  HCT 34.7* 34.8*  MCV 86.8 88.5  PLT 145* 417   Basic Metabolic  Panel Recent Labs    05/24/20 0405 05/24/20 1146 05/24/20 1859 05/25/20 0321  NA 134*  --  134* 137  K 3.1*   < > 3.7 3.4*  CL 95*  --  97* 99  CO2 27  --  25 27  GLUCOSE 252*  --  249* 199*  BUN 56*  --  67* 67*  CREATININE 2.34*  --  2.19* 2.07*  CALCIUM 7.2*  --  7.2* 7.3*  MG 1.7  --   --  2.1  PHOS 3.3  --   --  4.5   < > = values in this interval not displayed.   Liver Function Tests No results for input(s): AST, ALT, ALKPHOS, BILITOT, PROT, ALBUMIN in the last 72 hours. No results for input(s): LIPASE, AMYLASE in the last 72 hours. Cardiac Enzymes No results for input(s): CKTOTAL, CKMB, CKMBINDEX, TROPONINI in the last 72 hours.  BNP: BNP (last 3 results) No results for input(s): BNP in the last 8760 hours.  ProBNP (last 3 results) No results for input(s): PROBNP in the last 8760 hours.   D-Dimer No results for input(s): DDIMER in the last 72 hours. Hemoglobin A1C No results for input(s): HGBA1C in the last 72 hours. Fasting Lipid Panel No results for input(s): CHOL, HDL, LDLCALC, TRIG, CHOLHDL, LDLDIRECT in the last 72 hours. Thyroid Function Tests No results for  input(s): TSH, T4TOTAL, T3FREE, THYROIDAB in the last 72 hours.  Invalid input(s): FREET3  Other results:   Imaging    DG CHEST PORT 1 VIEW  Result Date: 05/24/2020 CLINICAL DATA:  Shortness of breath and hypotension. EXAM: PORTABLE CHEST 1 VIEW COMPARISON:  05/23/2020 FINDINGS: Cardiomegaly, pulmonary vascular congestion and mild interstitial again noted. An endotracheal tube with tip 6.2 cm above the carina, NG/OG tube entering the stomach, and RIGHT subclavian central venous catheter with tip overlying the LOWER SVC again noted. There is no evidence of pneumothorax. Mild bibasilar atelectasis is again noted. Little interval changes noted from the prior study. IMPRESSION: Unchanged appearance of the chest with cardiomegaly, pulmonary vascular congestion, probable mild interstitial edema and  bibasilar atelectasis. Electronically Signed   By: Margarette Canada M.D.   On: 05/24/2020 12:04     Medications:     Scheduled Medications: . aspirin  81 mg Oral Daily  . atorvastatin  80 mg Oral Daily  . chlorhexidine gluconate (MEDLINE KIT)  15 mL Mouth Rinse BID  . Chlorhexidine Gluconate Cloth  6 each Topical Daily  . Chlorhexidine Gluconate Cloth  6 each Topical Daily  . clopidogrel  75 mg Oral Q breakfast  . docusate sodium  100 mg Oral BID  . enoxaparin (LOVENOX) injection  70 mg Subcutaneous Q24H  . hydrocortisone sod succinate (SOLU-CORTEF) inj  50 mg Intravenous Daily  . insulin aspart  0-20 Units Subcutaneous Q4H  . insulin detemir  34 Units Subcutaneous BID  . mouth rinse  15 mL Mouth Rinse BID  . pantoprazole sodium  40 mg Per Tube Daily  . polyethylene glycol  17 g Per Tube Daily  . sodium chloride flush  10-40 mL Intracatheter Q12H  . sodium chloride flush  3 mL Intravenous Q12H    Infusions: . sodium chloride Stopped (05/25/20 0751)  . amiodarone 30 mg/hr (05/25/20 0800)  . cefTRIAXone (ROCEPHIN)  IV Stopped (05/25/20 0010)  . dexmedetomidine (PRECEDEX) IV infusion 0.6 mcg/kg/hr (05/25/20 0800)  . DOBUTamine Stopped (05/24/20 1623)  . epinephrine Stopped (05/22/20 0946)  . metronidazole Stopped (05/25/20 0241)  . norepinephrine (LEVOPHED) Adult infusion 1 mcg/min (05/25/20 0400)  . potassium chloride 50 mL/hr at 05/25/20 0800  . vasopressin 0.03 Units/min (05/25/20 0800)    PRN Medications: sodium chloride, acetaminophen, fentaNYL (SUBLIMAZE) injection, ketotifen, lip balm, nitroGLYCERIN, ondansetron (ZOFRAN) IV, polyvinyl alcohol, sodium chloride flush, sodium chloride flush  Assessment/Plan   1. Shock: Primarily vasodilatory shock, suspect due to prolonged hypotension with arrest and tissue ischemia. Echo with LV EF in the 45% range with mildly dilated RV but function appeared relatively normal.   - Bradycardic this morning with brief 3rd degree. Stop  vasopressin and start dobutamine 2.5 mcg.  - Check CO-OX this morning.  - CVP 7. Hold diuretics today.  2. CAD: Patient appears to have CTO of the mid RCA with good collaterals.  I do not think this was acute MI from plaque rupture.  HS-TnI significantly elevated, but think this was generalized ischemia from hypotension with prolonged cardiac arrest.  -Chest soreness reported  - Continue ASA/Plavix.  - Continue statin.  - Off heparin gtt (enoxaparin [place] prophylaxis).  3. Cardiac arrest: VF arrest with immediate CPR but prolonged time to ROSC (about 45 minutes).   - Stop amio drip with 3rd degree.  - Consult EP today.   4. AKI: Creatinine peaked  3.06-->2.86-->2.67-->2.34-->2.07.  Suspect ATN in setting of prolonged cardiac arrest.  5. Acute hypoxemic respiratory failure: CXR with bilateral upper  lobe infiltrates likely due to aspiration with arrest.  - Extubated 5/22. Sats stable on nasal cannula.  - ceftriaxone for aspiration coverage.  6. Neuro: Following commands.  7. Elevated LFTs: Relatively mild considering down-time.  Shock liver.  Trending down.  8. Diabetes: Insulin.  9. Thrombocytopenia: Up to 182 today.  10. Bradycardia--> brief 3rd degree. Stop amio drip. Start dobutamine 2.5 mcg.  - Consult EP  Discussed with Dr Aundra Dubin. EP consulted.    Amy Clegg NP-C  05/25/2020 8:22 AM  Patient seen with NP, agree with the above note.   Extubated yesterday.   This morning, he was only on vasopressin 0.03 and amiodarone.  He had a brief episode of complete heart block.  Amiodarone and vasopressin stopped, restarted on dobutamine at 2.5 for now.  Currently, NSR in 80s.    I/Os even yesterday, off Lasix gtt.  Co-ox 57%, CVP 10 on my read.  Creatinine lower at 2.07.   General: NAD Neck: Thick, JVP 8-9 cm, no thyromegaly or thyroid nodule.  Lungs: Decreased at bases.  CV: Nondisplaced PMI.  Heart regular S1/S2, no S3/S4, no murmur.  1+ ankle edema.  Abdomen: Soft, nontender, no  hepatosplenomegaly, no distention.  Skin: Intact without lesions or rashes.  Neurologic: Alert and oriented x 3.  Psych: Normal affect. Extremities: No clubbing or cyanosis.  HEENT: Normal.   CHB transiently, he was on amiodarone gtt at the time. 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   - Will need ICD for secondary prevention with VF arrest.  EF is about 40%.  Will need to discuss with EP if he needs dual chamber ICD versus device with His bundle or left bundle lead depending on how much we think he will pace.  - Off amiodarone now with CHB, watch rhythm with history of VF.   - Continue dobutamine for now at 2.5, will stop once device is in place.   Shock has resolved, off vasopressin now.    On ceftriaxone for aspiration PNA, now just on [place].   CVP 10 and creatinine trending down, will give Lasix 40 mg IV bid today and replace K.   CRITICAL CARE Performed by: Loralie Champagne  Total critical care time: 35 minutes  Critical care time was exclusive of separately billable procedures and treating other patients.  Critical care was necessary to treat or prevent imminent or life-threatening deterioration.  Critical care was time spent personally by me on the following activities: development of treatment plan with patient and/or surrogate as well as nursing, discussions with consultants, evaluation of patient's response to treatment, examination of patient, obtaining history from patient or surrogate, ordering and performing treatments and interventions, ordering and review of laboratory studies, ordering and review of radiographic studies, pulse oximetry and re-evaluation of patient's condition.  Loralie Champagne 05/25/2020 10:36 AM

## 2020-05-25 NOTE — Progress Notes (Signed)
Nutrition Follow-up  DOCUMENTATION CODES:  Not applicable  INTERVENTION:  Advance diet as medically able and as tolerated.  Add 30 ml ProSource Plus po TID, each supplement provides 100 kcal and 15 grams of protein.   Add MVI with minerals daily.  NUTRITION DIAGNOSIS:  Increased nutrient needs related to acute illness as evidenced by estimated needs. - ongoing  GOAL:  Patient will meet greater than or equal to 90% of their needs - not meeting since TF was stopped  MONITOR:  Diet advancement,PO intake,Supplement acceptance,Labs,Weight trends,Skin,I & O's  REASON FOR ASSESSMENT:  Ventilator    ASSESSMENT:  Patient with PMH significant for DM, GERD, colonic polyps, s/p total hip replacement, HLD, and HTN. Presents this admission with VF cardiac arrest. 5/22 - weaning off pressors; extubated  Spoke with pt and wife at bedside. Pt reports not feeling like eating right yet. He and wife endorse a "[place]" at home for years before coming to the hospital. He has some hesitancy to change, but knows it is best for him.  He denies any weight changes recently. Wife endorses some fluctuations. Admit wt: 115 kg Current wt: 133.8 kg  Pt currently NPO. Emphasized the importance of protein once diet does advance. Recommend adding ProSource Plus TID to aid in protein intake.   If PO intake remains poor, may need to consider replacing Cortrak.  Medications: reviewed; colace BID, lasix, SSI, Levemir, Protonix, miralax, Klor-Con 40 mEq, Rocephin, Precedex, Flagyl, fentanyl, Zofran PRN (given once today)  Labs: reviewed; K 3.4, CBG 131-244  Diet Order:   Diet Order            Diet NPO time specified Except for: Sips with Meds  Diet effective now                EDUCATION NEEDS:  Not appropriate for education at this time  Skin:  Skin Assessment: Reviewed RN Assessment  Last BM:  05/24/20 - Type 7, large amount  Height:  Ht Readings from Last 1 Encounters:  05/20/20  5' 10.98" (1.803 m)   Weight:  Wt Readings from Last 1 Encounters:  05/24/20 133.8 kg   BMI:  Body mass index is 41.16 kg/m.  Estimated Nutritional Needs:  Kcal:  2050-2250 Protein:  110-125 grams Fluid:  >2 L  Derrel Nip, RD, LDN Registered Dietitian After Hours/Weekend Pager # in [place]

## 2020-05-25 NOTE — Progress Notes (Signed)
Assisted tele visit to patient with wife.  Temple Ewart Harold, RN  

## 2020-05-26 ENCOUNTER — Encounter (HOSPITAL_COMMUNITY)
Admission: EM | Disposition: A | Discharge status: Rehabilitation Facility | Payer: Self-pay | Source: Other Acute Inpatient Hospital | Attending: Family Medicine

## 2020-05-26 DIAGNOSIS — I509 Heart failure, unspecified: Secondary | ICD-10-CM | POA: Diagnosis not present

## 2020-05-26 DIAGNOSIS — I441 Atrioventricular block, second degree: Secondary | ICD-10-CM

## 2020-05-26 DIAGNOSIS — I469 Cardiac arrest, cause unspecified: Secondary | ICD-10-CM | POA: Diagnosis not present

## 2020-05-26 DIAGNOSIS — I442 Atrioventricular block, complete: Secondary | ICD-10-CM | POA: Diagnosis not present

## 2020-05-26 DIAGNOSIS — R570 Cardiogenic shock: Secondary | ICD-10-CM | POA: Diagnosis not present

## 2020-05-26 LAB — COOXEMETRY PANEL
Carboxyhemoglobin: 1.6 % — ABNORMAL HIGH (ref 0.5–1.5)
Methemoglobin: 1.2 % (ref 0.0–1.5)
O2 Saturation: 74.8 %
Total hemoglobin: 12.6 g/dL (ref 12.0–16.0)

## 2020-05-26 LAB — CBC
HCT: 37.9 % — ABNORMAL LOW (ref 39.0–52.0)
Hemoglobin: 12 g/dL — ABNORMAL LOW (ref 13.0–17.0)
MCH: 28.9 pg (ref 26.0–34.0)
MCHC: 31.7 g/dL (ref 30.0–36.0)
MCV: 91.3 fL (ref 80.0–100.0)
Platelets: 232 10*3/uL (ref 150–400)
RBC: 4.15 MIL/uL — ABNORMAL LOW (ref 4.22–5.81)
RDW: 14.8 % (ref 11.5–15.5)
WBC: 7.6 10*3/uL (ref 4.0–10.5)
nRBC: 0 % (ref 0.0–0.2)

## 2020-05-26 LAB — GLUCOSE, CAPILLARY
Glucose-Capillary: 116 mg/dL — ABNORMAL HIGH (ref 70–99)
Glucose-Capillary: 120 mg/dL — ABNORMAL HIGH (ref 70–99)
Glucose-Capillary: 133 mg/dL — ABNORMAL HIGH (ref 70–99)
Glucose-Capillary: 155 mg/dL — ABNORMAL HIGH (ref 70–99)
Glucose-Capillary: 183 mg/dL — ABNORMAL HIGH (ref 70–99)
Glucose-Capillary: 192 mg/dL — ABNORMAL HIGH (ref 70–99)

## 2020-05-26 LAB — BASIC METABOLIC PANEL
Anion gap: 9 (ref 5–15)
BUN: 59 mg/dL — ABNORMAL HIGH (ref 8–23)
CO2: 28 mmol/L (ref 22–32)
Calcium: 8 mg/dL — ABNORMAL LOW (ref 8.9–10.3)
Chloride: 104 mmol/L (ref 98–111)
Creatinine, Ser: 1.85 mg/dL — ABNORMAL HIGH (ref 0.61–1.24)
GFR, Estimated: 40 mL/min — ABNORMAL LOW (ref >60)
Glucose, Bld: 138 mg/dL — ABNORMAL HIGH (ref 70–99)
Potassium: 3.7 mmol/L (ref 3.5–5.1)
Sodium: 141 mmol/L (ref 135–145)

## 2020-05-26 SURGERY — ICD IMPLANT

## 2020-05-26 MED ORDER — FUROSEMIDE 10 MG/ML IJ SOLN
40.0000 mg | Freq: Two times a day (BID) | INTRAMUSCULAR | Status: AC
  Administered 2020-05-26 (×2): 40 mg via INTRAVENOUS
  Filled 2020-05-26 (×2): qty 4

## 2020-05-26 MED ORDER — CHLORHEXIDINE GLUCONATE 4 % EX LIQD
60.0000 mL | Freq: Once | CUTANEOUS | Status: DC
  Filled 2020-05-26: qty 60

## 2020-05-26 MED ORDER — POTASSIUM CHLORIDE CRYS ER 20 MEQ PO TBCR
40.0000 meq | EXTENDED_RELEASE_TABLET | Freq: Once | ORAL | Status: AC
  Administered 2020-05-26: 40 meq via ORAL
  Filled 2020-05-26: qty 2

## 2020-05-26 MED ORDER — POTASSIUM CHLORIDE 20 MEQ PO PACK
40.0000 meq | PACK | Freq: Once | ORAL | Status: AC
  Administered 2020-05-26: 40 meq via ORAL
  Filled 2020-05-26: qty 2

## 2020-05-26 MED ORDER — CLOPIDOGREL BISULFATE 75 MG PO TABS
75.0000 mg | ORAL_TABLET | Freq: Every day | ORAL | Status: DC
  Administered 2020-05-26 – 2020-05-27 (×2): 75 mg via ORAL
  Filled 2020-05-26 (×2): qty 1

## 2020-05-26 MED ORDER — CEFAZOLIN IN SODIUM CHLORIDE 3-0.9 GM/100ML-% IV SOLN: 3.0000 g | INTRAVENOUS | Status: DC

## 2020-05-26 MED ORDER — SODIUM CHLORIDE 0.9 % IV SOLN: 80.0000 mg | INTRAVENOUS | Status: DC

## 2020-05-26 MED ORDER — SODIUM CHLORIDE 0.9% FLUSH: 3.0000 mL | INTRAVENOUS | Status: DC | PRN

## 2020-05-26 MED ORDER — SODIUM CHLORIDE 0.9 % IV SOLN
INTRAVENOUS | Status: DC
Start: 2020-05-26 — End: 2020-06-02

## 2020-05-26 MED ORDER — CHLORHEXIDINE GLUCONATE 0.12 % MT SOLN
OROMUCOSAL | Status: AC
  Filled 2020-05-26: qty 15

## 2020-05-26 MED ORDER — CHLORHEXIDINE GLUCONATE 4 % EX LIQD
60.0000 mL | Freq: Once | CUTANEOUS | Status: AC
  Administered 2020-05-26: 4 via TOPICAL
  Filled 2020-05-26: qty 60

## 2020-05-26 NOTE — Evaluation (Signed)
Physical Therapy Evaluation Patient Details Name: [name] MRN: [id number] DOB: 1955/05/11 Today's Date: 05/26/2020   History of Present Illness  66 yo admitted 5/17 with OOH VF arrest with >45 min CPR. After arrival at [facility] pt with hypotension evening of 5/17 and emesis with STEMI. Intubated 5/17-5/22. PMhx: HTN, HLD, DM, GERD, chronic pain, RBBB, Left THA  Clinical Impression  Pt pleasant reporting sacral, neck and back pain with pt hesitant to move. Pt able to transition to sitting with foot egress and stand with +2 assist. Pt fatigued after sitting eOB and standing with return to supine. Pt able to wean to RA with sats 90-95% throughout session. Pt with decreased strength, transfers, balance and gait who will benefit from acute therapy to maximize mobility, safety and function.   HR 71 CVP 9 BP 163/65 (89)    Follow Up Recommendations CIR;Supervision/Assistance - 24 hour (pending progression)    Equipment Recommendations  None recommended by PT    Recommendations for Other Services       Precautions / Restrictions Precautions Precautions: Fall Precaution Comments: flow track, art line      Mobility  Bed Mobility Overal bed mobility: Needs Assistance Bed Mobility: Supine to Sit;Sit to Supine     Supine to sit: Mod assist Sit to supine: Total assist   General bed mobility comments: foot egress function of bed to achieve sitting  with pt able to use bil rails and mod assist to achieve anterior translation into sitting. Return to supine with bed flat function.    Transfers Overall transfer level: Needs assistance   Transfers: Sit to/from Stand Sit to Stand: Mod assist;+2 physical assistance         General transfer comment: Pt able to stand with bil knees blocked and assist for anterior translation and rise to standing with pt able to maintain grossly 30 sec  Ambulation/Gait             General Gait Details: not yet ready  Stairs             Wheelchair Mobility    Modified Rankin (Stroke Patients Only)       Balance Overall balance assessment: Needs assistance   Sitting balance-Leahy Scale: Fair Sitting balance - Comments: pt able to sit EOB grossly 12 min without UE support with cues for upright posture and trunk extension   Standing balance support: Bilateral upper extremity supported Standing balance-Leahy Scale: Poor Standing balance comment: bil UE on rails and therapist to stand with mod assist                             Pertinent Vitals/Pain Pain Assessment: 0-10 Pain Score: 4  Pain Location: back and buttock Pain Descriptors / Indicators: Aching;Sore Pain Intervention(s): Limited activity within patient's tolerance;Repositioned;Monitored during session    [place] expects to be discharged to:: Private residence Living Arrangements: Spouse/significant other Available Help at Discharge: Family;Available 24 hours/day Type of Home: House Home Access: Stairs to enter   [employer] of Steps: 1 Home Layout: One level Home Equipment: Walker - 2 wheels;Bedside commode      Prior Function Level of Independence: Independent               Hand Dominance        Extremity/Trunk Assessment   Upper Extremity Assessment Upper Extremity Assessment: Generalized weakness    Lower Extremity Assessment Lower Extremity Assessment: Generalized weakness  Cervical / Trunk Assessment Cervical / Trunk Assessment: Kyphotic  Communication   Communication: No difficulties  Cognition Arousal/Alertness: Awake/alert Behavior During Therapy: WFL for tasks assessed/performed Overall Cognitive Status: Within Functional Limits for tasks assessed                                        General Comments      Exercises     Assessment/Plan    PT Assessment Patient needs continued PT services  PT Problem List Decreased mobility;Decreased activity  tolerance;Decreased balance;Decreased knowledge of use of DME       PT Treatment Interventions Gait training;Balance training;Functional mobility training;Therapeutic activities;Patient/family education;Stair training;DME instruction;Therapeutic exercise    PT Goals (Current goals can be found in the Care Plan section)  Acute Rehab PT Goals Patient Stated Goal: return home PT Goal Formulation: With patient Time For Goal Achievement: 06/09/20 Potential to Achieve Goals: Fair    Frequency Min 3X/week   Barriers to discharge        Co-evaluation               AM-PAC PT "6 Clicks" Mobility  Outcome Measure Help needed turning from your back to your side while in a flat bed without using bedrails?: A Lot Help needed moving from lying on your back to sitting on the side of a flat bed without using bedrails?: Total Help needed moving to and from a bed to a chair (including a wheelchair)?: Total Help needed standing up from a chair using your arms (e.g., wheelchair or bedside chair)?: Total Help needed to walk in hospital room?: Total Help needed climbing 3-5 steps with a railing? : Total 6 Click Score: 7    End of Session   Activity Tolerance: Patient tolerated treatment well Patient left: in chair;with call bell/phone within reach;with bed alarm set Nurse Communication: Mobility status PT Visit Diagnosis: Other abnormalities of gait and mobility (R26.89);Difficulty in walking, not elsewhere classified (R26.2);Muscle weakness (generalized) (M62.81)    Time: 6808-8110 PT Time Calculation (min) (ACUTE ONLY): 27 min   Charges:   PT Evaluation $PT Eval Moderate Complexity: 1 Mod PT Treatments $Therapeutic Activity: 8-22 mins        Renaye Janicki P, PT Acute Rehabilitation Services Pager: [phone] Office: [phone]   Jataya Wann B Conn T[place]ta 05/26/2020, 9:59 AM

## 2020-05-26 NOTE — Progress Notes (Addendum)
Progress Note  Patient Name: [name] Date of Encounter: 05/26/2020  [facility] Cardiologist: Sinclair Grooms, MD   Subjective   "I am doing OK", denies SOB, chest wall tenderness is improving, no CP otherwise.  Inpatient Medications    Scheduled Meds: . (feeding supplement) PROSource Plus  30 mL Oral TID BM  . aspirin  81 mg Oral Daily  . atorvastatin  80 mg Oral Daily  . chlorhexidine  60 mL Topical Once  . chlorhexidine gluconate (MEDLINE KIT)  15 mL Mouth Rinse BID  . Chlorhexidine Gluconate Cloth  6 each Topical Daily  . docusate sodium  100 mg Oral BID  . furosemide  40 mg Intravenous BID  . gentamicin irrigation  80 mg Irrigation On Call  . hydrocortisone sod succinate (SOLU-CORTEF) inj  50 mg Intravenous Daily  . insulin aspart  0-20 Units Subcutaneous Q4H  . insulin detemir  34 Units Subcutaneous BID  . mouth rinse  15 mL Mouth Rinse BID  . multivitamin with minerals  1 tablet Oral Daily  . mupirocin ointment  1 application Nasal BID  . pantoprazole  40 mg Oral Daily  . polyethylene glycol  17 g Per Tube Daily  . potassium chloride  40 mEq Oral Once  . sodium chloride flush  10-40 mL Intracatheter Q12H  . sodium chloride flush  3 mL Intravenous Q12H  . sodium chloride flush  3 mL Intravenous Q12H   Continuous Infusions: . sodium chloride Stopped (05/25/20 1831)  . sodium chloride    .  ceFAZolin (ANCEF) IV    . dexmedetomidine (PRECEDEX) IV infusion 0.5 mcg/kg/hr (05/26/20 0733)  . DOBUTamine 2.5 mcg/kg/min (05/26/20 0600)  . metronidazole Stopped (05/26/20 0244)  . norepinephrine (LEVOPHED) Adult infusion Stopped (05/25/20 0914)   PRN Meds: sodium chloride, acetaminophen, ketotifen, lip balm, nitroGLYCERIN, ondansetron (ZOFRAN) IV, oxyCODONE, polyvinyl alcohol, sodium chloride flush, sodium chloride flush, sodium chloride flush   Vital Signs    Vitals:   05/26/20 0530 05/26/20 0600 05/26/20 0630 05/26/20 0700  BP:      Pulse: 70 73 64    Resp: $Remo'18 17 19   'RPPgY$ Temp:    98.3 F (36.8 C)  TempSrc:      SpO2: 100% 99% 99%   Weight:      Height:        Intake/Output Summary (Last 24 hours) at 05/26/2020 0831 Last data filed at 05/26/2020 0600 Gross per 24 hour  Intake 1167.83 ml  Output 4625 ml  Net -3457.17 ml   Last 3 Weights 05/26/2020 05/24/2020 05/22/2020  Weight (lbs) 288 lb 5.8 oz 294 lb 15.6 oz 325 lb 2.9 oz  Weight (kg) 130.8 kg 133.8 kg 147.5 kg      Telemetry    SR, no bradycardia or heart block - Personally Reviewed  ECG    No new EKGs - Personally Reviewed  Physical Exam   GEN: No acute distress.   Neck: No JVD, obese neck,  Cardiac: RRR, no murmurs, rubs, or gallops.  Respiratory: CTA b/l (ant/lat auscultation)  GI: Soft, nontender, non-distended, obese MS: No edema; No deformity. Neuro:  Nonfocal  Psych: Normal affect   Labs    High Sensitivity Troponin:   Recent Labs  Lab 05/20/20 0527 05/20/20 0842 05/20/20 1121 05/20/20 1240 05/20/20 1801  TROPONINIHS 10,906* 9,166* 8,258* 7,858* 5,624*      Chemistry Recent Labs  Lab 05/20/20 1121 05/20/20 1801 05/21/20 0205 05/21/20 2563 05/22/20 0250 05/23/20 0430 05/25/20 0321 05/25/20  1354 05/26/20 0346  NA 141   < > 138   < > 132*   < > 137 139 141  K 2.9*   < > 2.9*   < > 3.7   < > 3.4* 3.3* 3.7  CL 104   < > 101   < > 95*   < > 99 101 104  CO2 21*   < > 26   < > 28   < > $R'27 27 28  'SM$ GLUCOSE 271*   < > 156*   < > 152*   < > 199* 113* 138*  BUN 30*   < > 29*   < > 31*   < > 67* 66* 59*  CREATININE 2.82*   < > 3.06*   < > 2.86*   < > 2.07* 1.86* 1.85*  CALCIUM 7.1*   < > 6.7*   < > 6.5*   < > 7.3* 7.6* 8.0*  PROT 4.6*  --  4.3*  --  4.7*  --   --   --   --   ALBUMIN 2.5*  --  2.2*  --  2.4*  --   --   --   --   AST 249*  --  131*  --  68*  --   --   --   --   ALT 234*  --  174*  --  134*  --   --   --   --   ALKPHOS 39  --  40  --  52  --   --   --   --   BILITOT 0.8  --  0.7  --  1.0  --   --   --   --   GFRNONAA 24*   < > 22*    < > 24*   < > 35* 39* 40*  ANIONGAP 16*   < > 11   < > 9   < > $R'11 11 9   'LU$ < > = values in this interval not displayed.     Hematology Recent Labs  Lab 05/24/20 0405 05/25/20 0321 05/26/20 0346  WBC 7.9 9.1 7.6  RBC 4.00* 3.93* 4.15*  HGB 11.6* 11.5* 12.0*  HCT 34.7* 34.8* 37.9*  MCV 86.8 88.5 91.3  MCH 29.0 29.3 28.9  MCHC 33.4 33.0 31.7  RDW 14.6 14.7 14.8  PLT 145* 182 232    BNPNo results for input(s): BNP, PROBNP in the last 168 hours.   DDimer No results for input(s): DDIMER in the last 168 hours.   Radiology    DG CHEST PORT 1 VIEW Result Date: 05/24/2020 CLINICAL DATA:  Shortness of breath and hypotension. EXAM: PORTABLE CHEST 1 VIEW COMPARISON:  05/23/2020 FINDINGS: Cardiomegaly, pulmonary vascular congestion and mild interstitial again noted. An endotracheal tube with tip 6.2 cm above the carina, NG/OG tube entering the stomach, and RIGHT subclavian central venous catheter with tip overlying the LOWER SVC again noted. There is no evidence of pneumothorax. Mild bibasilar atelectasis is again noted. Little interval changes noted from the prior study. IMPRESSION: Unchanged appearance of the chest with cardiomegaly, pulmonary vascular congestion, probable mild interstitial edema and bibasilar atelectasis. Electronically Signed   By: Margarette Canada M.D.   On: 05/24/2020 12:04    Cardiac Studies   Result Date: 05/19/2020  100% occlusion of the mid RCA, likely representing a chronic total occlusion given absence of thrombus staining in the area of occlusion and well-formed left-to-right collaterals.  Widely  patent left main  Widely patent LAD  Widely patent circumflex  LVEDP 14 initially and following angiography and IV fluid 20 mmHg  RECOMMENDATIONS:  IV dobutamine to improve potential RV dysfunction.  IV fluids as tolerated  A central line will be placed in the unit to measure co-oximetry and get right heart filling pressures.  Continue Levophed, epinephrine, IV fentanyl,  and intermittent Versed as needed. Wean Levophed and epinephrine as tolerated.  Advanced heart failure team will help manage.  Critical care team, Dr. Doyne Keel managing primarily.   05/20/2020: TTE IMPRESSIONS  1. Left ventricular ejection fraction, by estimation, is 40 to 45%. The  left ventricle has mildly decreased function. The left ventricle  demonstrates regional wall motion abnormalities (basal and mid  inferolateral suggestive of RCA disease). There is  mild concentric left ventricular hypertrophy. Left ventricular diastolic  parameters are indeterminate.  2. Right ventricular systolic function is hyperdynamic. The right  ventricular size is normal.  3. A small pericardial effusion is present.  4. The mitral valve is grossly normal. No evidence of mitral valve  regurgitation.  5. The aortic valve was not well visualized. Aortic valve regurgitation  is not visualized.  6. Aortic dilatation noted. There is borderline dilatation of the aortic  root, measuring 38 mm.  7. The inferior vena cava is dilated in size with <50% respiratory  variability, suggesting right atrial pressure of 15 mmHg.   Patient Profile     65 y.o. male with a hx of IDDM, HLD, HTN, HLD, who was admitted to [facility] 05/19/20 after a witnessed cardiac arrest    suffered witnessed out of hospital cardiac arrest 5/17 w/ immediate bystander CPR, initiated by wife, and prompt activation of EMS.  Wife reported EMS shocking him apparently multiple times Per report, had prolonged CPR, more than 45 min prior to ROSC. Underlying rhythm was VFib. Post arrest EKG suggested possibility of inferior MI.Portable bedside echoshowedinferobasal severe hypokinesis with septal bounce. RV systolic function normal.LVEF 40 to 50% range.  HS Trop 2566 > 9947 > 10906 > 9166 > 8258 > 7858 > 5624 K+ 4.3 > 3.0 > 2.7 > 2.7 > 2.6 > 2.5 > 2.9 >>>> 3.5 > 3.7 > 3.4  Mag 2.8 > 1.2 > 2.2 >>>> 2.1 Creat 1.29 >> 3.06 >>>>  2.07  Emergent cath showed100% occlusion of the mid RCA, likely representing a chronic total occlusion given absence of thrombus staining in the area of occlusion and well-formed left-to-right collaterals. No other disease. LM, LAD and LCx all widely patent. No indication for PCI. He had cardiogenic shock, HF team was brought on board requiring multiple pressors, electrolytes supported/replaced, was also on hydrocortisone Was intubated but did not require mechanical cardiac support Started and maintained on amiodarone gtt Started on ceftriaxone/Flagyl for aspiration coverage EchowithLV EF in the 45% range with mildly dilated RV but function appearedrelatively normal. Abn LFTs and AKI felt 2/2 prolonged arrest Lasix gtt with excellent UOP Over the weekend started weaning off his pressors and he was extubated 5/22\ Weaned off pressors 5/22-23, developed CHB with prolonged V pause of 7 seconds and dobutamine resumed amiodarone gtt stopped EP consulted for ICD/pacing needs   Assessment & Plan    1. VF arrest (no EKGs/tracings of this)     Reportedly had prolonged CPR and multiple shocks by EMS 2. CAD 3. MI/ACS     100% RCA, felt to be CTO, not intervened      Cath as above     On ASA,  statin, off hep gtt 4. Shock     Back on dobutamine for CHB, in d/w Dr. Aundra Dubin, no pressor needs otherwise     Ok to discontinue central line for device implant with plans to stop dobutamoine after device is in place  5. ICM     LVEF 40-45%     Diuresing well     Cumulatively neg 9311ml     HF team managing  6. CHB     Baseline conduction system disease w/ IVCD <130 msec, and LAD     Regained 1:1 on dobutamine He had transient complete heart block and subsequent mobitz I AV block.  Revisited with the patient need for ICD/pacing support (discussed at length yesterday with patient and his wife) We discussed the procedure, potential risks and benefits, he remains agreeable Dr. Rayann Heman will see  later this morning  Will hold plavix today for procedure, he had no coronary intervention  I have asked RN to trial laying near flat this AM to make sure he can tolerate the procedure   7. Respiratory failure, ARDS     Likely aspiration, pneumonitis - improving     Extubated 5/22     On antibiotics, today is last day of antibiotics   8. Pt is pleasant and follows commands appropriately, some degree of post arrest enephalopathy     Clearer today, AAO to self, place, situation, president, could not recall year     We re-visited plans for ICD implant and rational, he recalled yesterday's discussion       ADDEND: Dr. Rayann Heman has seen the patient and reviewed telemetry. Suspects perhaps was a vagal event and would like to wean off dobutamine and follow not felt to be ready clinically for an implant today particularly with an elevated infection risk currently and would like him to be closer to discharge and will d/w Dr. Aundra Dubin We discussed with RN at bedside: d/c central line and wean off dobutamine following rhythm closely to resume dobutamine if recurrent heart block OK to resume diet  No plans today for device implant    For questions or updates, please contact [facility] Please consult [email or web address] for contact info under        Signed, Baldwin Jamaica, PA-C  05/26/2020, 8:31 AM     I have seen, examined the patient, and reviewed the above assessment and plan.  Changes to above are made where necessary.  On exam, NAD, alert, frequently suctioning his own secretions, R subclavian CVL in place, RRR.    The patient is making slow improvement.  I worry currently about infectious risks if we proceed with ICD too soon.  I would advise that his CVL be removed today.  Removing any unnecessary instrumentation weaning dobutamine to off if able may be beneficial.  We will plan ICD implantation prior to discharge for secondary prevention of sudden death.  We will continue to  observe his AV conduction while here.  Co Sign: Thompson Grayer, MD 05/26/2020 2:33 PM

## 2020-05-26 NOTE — Progress Notes (Signed)
Patient ID: [name], male   DOB: 1955/10/23, 65 y.o.   MRN: [id number]     Advanced Heart Failure Rounding Note  PCP-Cardiologist: Sinclair Grooms, MD   Subjective:   - Extubated 5/22 - CHB episode 5/23, amiodarone stopped.   I/Os -3725 net on Lasix 40 mg IV bid yesterday, CVP 10.  Co-ox 75% on dobutamine 2.5.  No further heart block.   Objective:   Weight Range: 130.8 kg Body mass index is 40.24 kg/m.   Vital Signs:   Temp:  [97.7 F (36.5 C)-98.4 F (36.9 C)] 97.8 F (36.6 C) (05/24 0327) Pulse Rate:  [31-76] 64 (05/24 0630) Resp:  [16-24] 19 (05/24 0630) SpO2:  [95 %-100 %] 99 % (05/24 0630) Arterial Line BP: (112-246)/(44-59) 129/48 (05/24 0630) Weight:  [130.8 kg] 130.8 kg (05/24 0500) Last BM Date: 05/24/20  Weight change: Filed Weights   05/22/20 0500 05/24/20 0600 05/26/20 0500  Weight: (!) 147.5 kg 133.8 kg 130.8 kg    Intake/Output:   Intake/Output Summary (Last 24 hours) at 05/26/2020 0753 Last data filed at 05/26/2020 0600 Gross per 24 hour  Intake 1260.23 ml  Output 4985 ml  Net -3724.77 ml      Physical Exam  CVP 10  General: NAD Neck: Thick JVP difficult, no thyromegaly or thyroid nodule.  Lungs: Clear to auscultation bilaterally with normal respiratory effort. CV: Nondisplaced PMI.  Heart regular S1/S2, no S3/S4, no murmur.  No peripheral edema.   Abdomen: Soft, nontender, no hepatosplenomegaly, no distention.  Skin: Intact without lesions or rashes.  Neurologic: Alert and oriented x 3.  Psych: Normal affect. Extremities: No clubbing or cyanosis.  HEENT: Normal.    Telemetry  NSR (personally reviewed)  Labs    CBC Recent Labs    05/25/20 0321 05/26/20 0346  WBC 9.1 7.6  HGB 11.5* 12.0*  HCT 34.8* 37.9*  MCV 88.5 91.3  PLT 182 993   Basic Metabolic Panel Recent Labs    05/24/20 0405 05/24/20 1146 05/25/20 0321 05/25/20 1354 05/26/20 0346  NA 134*   < > 137 139 141  K 3.1*   < > 3.4* 3.3* 3.7  CL 95*   < > 99  101 104  CO2 27   < > _0 GLUCOSE 252*   < > 199* 113* 138*  BUN 56*   < > 67* 66* 59*  CREATININE 2.34*   < > 2.07* 1.86* 1.85*  CALCIUM 7.2*   < > 7.3* 7.6* 8.0*  MG 1.7  --  2.1  --   --   PHOS 3.3  --  4.5  --   --    < > = values in this interval not displayed.   Liver Function Tests No results for input(s): AST, ALT, ALKPHOS, BILITOT, PROT, ALBUMIN in the last 72 hours. No results for input(s): LIPASE, AMYLASE in the last 72 hours. Cardiac Enzymes No results for input(s): CKTOTAL, CKMB, CKMBINDEX, TROPONINI in the last 72 hours.  BNP: BNP (last 3 results) No results for input(s): BNP in the last 8760 hours.  ProBNP (last 3 results) No results for input(s): PROBNP in the last 8760 hours.   D-Dimer No results for input(s): DDIMER in the last 72 hours. Hemoglobin A1C No results for input(s): HGBA1C in the last 72 hours. Fasting Lipid Panel No results for input(s): CHOL, HDL, LDLCALC, TRIG, CHOLHDL, LDLDIRECT in the last 72 hours. Thyroid Function Tests No results for input(s): TSH, T4TOTAL, T3FREE, THYROIDAB  in the last 72 hours.  Invalid input(s): FREET3  Other results:   Imaging    No results found.   Medications:     Scheduled Medications: . (feeding supplement) PROSource Plus  30 mL Oral TID BM  . aspirin  81 mg Oral Daily  . atorvastatin  80 mg Oral Daily  . chlorhexidine  60 mL Topical Once  . chlorhexidine gluconate (MEDLINE KIT)  15 mL Mouth Rinse BID  . Chlorhexidine Gluconate Cloth  6 each Topical Daily  . docusate sodium  100 mg Oral BID  . furosemide  40 mg Intravenous BID  . gentamicin irrigation  80 mg Irrigation On Call  . hydrocortisone sod succinate (SOLU-CORTEF) inj  50 mg Intravenous Daily  . insulin aspart  0-20 Units Subcutaneous Q4H  . insulin detemir  34 Units Subcutaneous BID  . mouth rinse  15 mL Mouth Rinse BID  . multivitamin with minerals  1 tablet Oral Daily  . mupirocin ointment  1 application Nasal BID  .  pantoprazole  40 mg Oral Daily  . polyethylene glycol  17 g Per Tube Daily  . potassium chloride  40 mEq Oral Once  . sodium chloride flush  10-40 mL Intracatheter Q12H  . sodium chloride flush  3 mL Intravenous Q12H  . sodium chloride flush  3 mL Intravenous Q12H    Infusions: . sodium chloride Stopped (05/25/20 1831)  . sodium chloride    .  ceFAZolin (ANCEF) IV    . dexmedetomidine (PRECEDEX) IV infusion 0.5 mcg/kg/hr (05/26/20 0733)  . DOBUTamine 2.5 mcg/kg/min (05/26/20 0600)  . metronidazole Stopped (05/26/20 0244)  . norepinephrine (LEVOPHED) Adult infusion Stopped (05/25/20 0914)    PRN Medications: sodium chloride, acetaminophen, ketotifen, lip balm, nitroGLYCERIN, ondansetron (ZOFRAN) IV, oxyCODONE, polyvinyl alcohol, sodium chloride flush, sodium chloride flush, sodium chloride flush  Assessment/Plan   1. Shock: Primarily vasodilatory shock, suspect due to prolonged hypotension with arrest and tissue ischemia. Echo with LV EF in the 45% range with mildly dilated RV but function appeared relatively normal.  Now on dobutamine 2.5 with co-ox 75%, on this primarily due to CHB on 5/24, can stop when he gets device.  - Stop dobutamine after device placed.  2. Acute on chronic HF with mid-rage EF: Ischemic cardiomyopathy, EF in the 45% range.  Patient was markedly volume overloaded, now diuresing well. CVP 10 today.  - Lasix 40 mg IV bid, replace K.  3. CAD: Patient appears to have CTO of the mid RCA with good collaterals.  I do not think this was acute MI from plaque rupture.  HS-TnI significantly elevated, but think this was generalized ischemia from hypotension with prolonged cardiac arrest.  - Continue ASA/Plavix.  - Continue statin.  - Off heparin gtt (enoxaparin [place] prophylaxis).  4. Cardiac arrest: VF arrest with immediate CPR but prolonged time to ROSC (about 45 minutes).  Stoppd amio drip with 3rd degree.  - To get ICD today, with low EF would consider left bundle lead for  possible future pacing.   4. AKI: Creatinine peaked 3.06-->2.86-->2.67-->2.34-->2.07->1.85.  Suspect ATN in setting of prolonged cardiac arrest.  5. Acute hypoxemic respiratory failure: CXR with bilateral upper lobe infiltrates likely due to aspiration with arrest.  Extubated 5/22. Sats stable on nasal cannula.  - ceftriaxone/Flagyl for aspiration coverage.  7. Elevated LFTs: Relatively mild considering down-time.  Shock liver.  Trending down.  8. Diabetes: Insulin.  9. Thrombocytopenia: Resolved.  10. 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 -  ICD placement today, consider LB lead with low EF and possible need for pacing.   CRITICAL CARE Performed by: Loralie Champagne  Total critical care time: 35 minutes  Critical care time was exclusive of separately billable procedures and treating other patients.  Critical care was necessary to treat or prevent imminent or life-threatening deterioration.  Critical care was time spent personally by me on the following activities: development of treatment plan with patient and/or surrogate as well as nursing, discussions with consultants, evaluation of patient's response to treatment, examination of patient, obtaining history from patient or surrogate, ordering and performing treatments and interventions, ordering and review of laboratory studies, ordering and review of radiographic studies, pulse oximetry and re-evaluation of patient's condition.  Loralie Champagne 05/26/2020 7:53 AM

## 2020-05-26 NOTE — Progress Notes (Signed)
NAME:  [name], MRN:  [id number], DOB:  [birth year], LOS: 7 ADMISSION DATE:  05/19/2020, CONSULTATION DATE: 05/19/2020 REFERRING MD: Dina Rich - ED Zacarias Pontes, CHIEF COMPLAINT: Cardiogenic shock  History of Present Illness:  65 year old man with PMHx significant for HTN, HLD, T2DM, GERD, chronic pain, RBBB and first degree AV block who presented with chest pain at [facility].  OOH VF arrest with immediate bystander CPR and EMS activation with > 45 minutes total CPR. Post arrest, felt to have inferior wall STEMI on EKG. Intubated, sedated and transferred to [facility] for potential PCI.  On arrival to [facility], EKG no longer concerning for inferior wall STEMI.  At that time, patient was awake and following commands on sedation. Initial plan to admit patient as NSTEMI and proceed to angiography in the morning following medical optimization.  Overnight 5/17, patient became hypotensive to SBP 80s with emesis x 1. Repeat EKG demonstrated marked ST elevation in the inferior leads. Patient was taken to cath lab where mid-RCA lesion (total occlusion) was noted but not stented (suspect chronic lesion due to adequate collateral flow). Patient returned to CVICU. Heparin and DAPT initiated for ACS. Remained hypotensive requiring initiation of multiple vasopressors for management of distributive shock.  Pertinent Medical History   Past Medical History:  Diagnosis Date  . Adenomatous polyp   . Cataract    left eye for sure   . Chronic pain   . Diabetes mellitus   . GERD (gastroesophageal reflux disease)   . History of colonic polyps   . History of hip replacement, total   . Hx of anaphylactic shock   . Hyperlipidemia   . Hypertension   . Left anterior hemiblock   . Obesity   . RBBB (right bundle branch block with left anterior fascicular block)   . 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 5/17 Admitted to [facility] as transfer from [place]. S/p VF arrest with 45 mins CPR before ROSC. Left femoral line inserted. C/f inferior STEMI. Coronary angiography with PCI RCA lesion, not stented, thought to be chronic . 5/18 Remains hypotensive on multiple pressors. LA and troponins downtrending. Weaning Giapreza. SVR and CO remain suboptimal . 5/19 Weaning DBA, Giapreza. Starting lasix gtt. Able to follow commands. Had an episode of VF, self-aborted. Was given additional amiodarone. Off Giapressa . 5/22 Weaning off pressors, extubated  Interim History / Subjective:   Up with PT this morning. Complaining of back pain   Objective   Blood pressure 128/61, pulse 78, temperature 100.3 F (37.9 C), temperature source Axillary, resp. rate (!) 22, height 5' 10.98" (1.803 m), weight 130.8 kg, SpO2 95 %. CVP:  [4 mmHg-60 mmHg] 60 mmHg      Intake/Output Summary (Last 24 hours) at 05/26/2020 1729 Last data filed at 05/26/2020 1538 Gross per 24 hour  Intake 1374.5 ml  Output 5375 ml  Net -4000.5 ml   Filed Weights   05/22/20 0500 05/24/20 0600 05/26/20 0500  Weight: (!) 147.5 kg 133.8 kg 130.8 kg   Physical Examination: Gen:    elderly male, comfortable in bed, remains on precedex  HEENT: tracking appropriately  Lungs:   CTAB CV:       RRR, s1 s2  Abd:   BS present, soft, nt nd  Ext: BL LE edema  Skin:   Warm,  Neuro: alert following commands, up  with PT   Labs/imaging that I have personally reviewed: (right click and "Reselect all SmartList Selections" daily)   Sodium 141, potassium 3.7 Serum creatinine 1.85 White blood cell count 7.6 Hemoglobin 12.0  Resolved Hospital Problem list   None  Assessment & Plan:   Acute hypoxic respiratory failure secondary to acute pulmonary edema requiring mechanical ventilation, now extubated 05/24/2020 ARDS due to aspiration pneumonitis- improving Plan: Completed 7 day course ceftriaxone plus flagyl, stop date  05/26/2020  Cardiogenic shock secondary to acute coronary syndrome likely involving the inferior wall requiring titration of vasoactive infusions VF cardiac arrest Echo LVEF 40-45%. S/p cath lab where mid-RCA lesion (total occlusion) was noted but not stented, suspected to be chronic due to well-developed collaterals. Plan: Off pressors this morning  Remove arterial line  Remove flow trac  Possible movement out of the ICU as early as tomorrow.   Distributive shock with concern for mesenteric ischemia - resolved   Uncontrolled type 2 diabetes Plan: SSI plus levemir   AKI Positive cumulative fluid balance Plan: Diuresis to maintain euvolemia  Elevated transaminases from shock liver, improving Follow lfts  Supportive care   Hypertension Holding antihypertensives   Back pain - prn oxycodone   Best practice (right click and "Reselect all SmartList Selections" daily)  Diet:  Tube Feed  Pain/Anxiety/Delirium protocol (if indicated): Yes (RASS goal -1) VAP protocol (if indicated): Yes DVT prophylaxis: LMWH GI prophylaxis: PPI Glucose control:  SSI Yes and Basal insulin Yes Central venous access:  Yes, and it is still needed Arterial line:  Yes, and it is still needed Foley:  Yes, and it is still needed Mobility:  bed rest  PT consulted: N/A Last date of multidisciplinary goals of care discussion [wife updated at bedside 5/19] Code Status:  full code  Disposition: ICU   Garner Nash, DO [facility] 05/26/2020 5:30 PM

## 2020-05-27 DIAGNOSIS — R570 Cardiogenic shock: Secondary | ICD-10-CM | POA: Diagnosis not present

## 2020-05-27 DIAGNOSIS — I469 Cardiac arrest, cause unspecified: Secondary | ICD-10-CM | POA: Diagnosis not present

## 2020-05-27 DIAGNOSIS — I509 Heart failure, unspecified: Secondary | ICD-10-CM | POA: Diagnosis not present

## 2020-05-27 LAB — BASIC METABOLIC PANEL
Anion gap: 12 (ref 5–15)
BUN: 54 mg/dL — ABNORMAL HIGH (ref 8–23)
CO2: 25 mmol/L (ref 22–32)
Calcium: 8.5 mg/dL — ABNORMAL LOW (ref 8.9–10.3)
Chloride: 104 mmol/L (ref 98–111)
Creatinine, Ser: 1.8 mg/dL — ABNORMAL HIGH (ref 0.61–1.24)
GFR, Estimated: 41 mL/min — ABNORMAL LOW (ref >60)
Glucose, Bld: 148 mg/dL — ABNORMAL HIGH (ref 70–99)
Potassium: 3.7 mmol/L (ref 3.5–5.1)
Sodium: 141 mmol/L (ref 135–145)

## 2020-05-27 LAB — GLUCOSE, CAPILLARY
Glucose-Capillary: 171 mg/dL — ABNORMAL HIGH (ref 70–99)
Glucose-Capillary: 237 mg/dL — ABNORMAL HIGH (ref 70–99)
Glucose-Capillary: 226 mg/dL — ABNORMAL HIGH (ref 70–99)
Glucose-Capillary: 200 mg/dL — ABNORMAL HIGH (ref 70–99)
Glucose-Capillary: 243 mg/dL — ABNORMAL HIGH (ref 70–99)
Glucose-Capillary: 186 mg/dL — ABNORMAL HIGH (ref 70–99)

## 2020-05-27 MED ORDER — POTASSIUM CHLORIDE 20 MEQ PO PACK
40.0000 meq | PACK | Freq: Once | ORAL | Status: AC
  Administered 2020-05-27: 40 meq via ORAL
  Filled 2020-05-27: qty 2

## 2020-05-27 MED ORDER — HYDROCORTISONE NA SUCCINATE PF 100 MG IJ SOLR
25.0000 mg | Freq: Every day | INTRAMUSCULAR | Status: AC
  Administered 2020-05-27: 25 mg via INTRAVENOUS
  Filled 2020-05-27: qty 2

## 2020-05-27 MED ORDER — CARVEDILOL 3.125 MG PO TABS: 3.1250 mg | ORAL_TABLET | Freq: Two times a day (BID) | ORAL | Status: DC

## 2020-05-27 MED ORDER — FUROSEMIDE 10 MG/ML IJ SOLN
40.0000 mg | Freq: Once | INTRAMUSCULAR | Status: AC
  Administered 2020-05-27: 40 mg via INTRAVENOUS
  Filled 2020-05-27: qty 4

## 2020-05-27 NOTE — Progress Notes (Signed)
Physical Therapy Treatment Patient Details Name: [name] MRN: [id number] DOB: [birth year] Today's Date: 05/27/2020       PT Comments    Pt demonstrates ability to perform bed mobility and transfers, requiring physical assistance for completion. Pt reports bout of dizziness with sitting and BP found to be 120/65.  Pt demonstrates ability to perform transfers, requiring less physical assistance and tolerates increased transfer attempts this session. Pt continues to demonstrate generalized weakness, decreased activity tolerance, deficits in endurance and power, and will benefit from acute PT to improve safety and independence in mobility.     05/27/20 0527  PT Visit Information  Last PT Received On 05/27/20  Assistance Needed +2  History of Present Illness 65 yo admitted 5/17 with OOH VF arrest with >45 min CPR. After arrival at [facility] pt with hypotension evening of 5/17 and emesis with STEMI. Intubated 5/17-5/22. PMhx: HTN, HLD, DM, GERD, chronic pain, RBBB, Left THA  Subjective Data  Patient Stated Goal Get better and go home  Precautions  Precautions Fall  Restrictions  Weight Bearing Restrictions No  Pain Assessment  Pain Assessment Faces  Faces Pain Scale 6  Pain Location chest  Pain Descriptors / Indicators Aching;Sore;Grimacing;Guarding  Pain Intervention(s) Limited activity within patient's tolerance;Monitored during session  Cognition  Arousal/Alertness Awake/alert  Behavior During Therapy [facility] for tasks assessed/performed  Overall Cognitive Status Within Functional Limits for tasks assessed  Bed Mobility  Overal bed mobility Needs Assistance  Bed Mobility Supine to Sit;Sit to Supine  Supine to sit Mod assist;+2 for physical assistance  Sit to supine Mod assist;+2 for physical assistance  General bed mobility comments mod A for LEs and trunk with supine>sit transfer. mod A for LEs and trunk to lower to bed with sit>supine transfer.  Transfers  Overall transfer level  Needs assistance  Equipment used Rolling walker (2 wheeled);None  Transfers Sit to/from [facility]  Sit to [employer] physical assistance;From elevated surface  Stand pivot transfers Min assist  General transfer comment Pt performs sit>stand with min A + 2 to power to stand. Pt requires min A for hip extension for stand pivot transfer  Balance  Overall balance assessment Needs assistance  Sitting-balance support Feet unsupported;No upper extremity supported  Sitting balance-Leahy Scale Fair  Sitting balance - Comments Pt tolerates sitting for over 10 minutes without requiring UE support to maintain sitting balance.  Standing balance support No upper extremity supported;Bilateral upper extremity supported;During functional activity  Standing balance-Leahy Scale Poor  Standing balance comment pt relies upon UE support to maintain static standing balance.  General Comments  General comments (skin integrity, edema, etc.) Pt on 1.5 L humidified [place] with oxygen sat levels ranging from 93-96%. Pt reports dizziness upon supine>sit, BP 120/65  PT - End of Session  Activity Tolerance Patient tolerated treatment well  Patient left in bed;with call bell/phone within reach;with family/visitor present  Nurse Communication Mobility status   PT - Assessment/Plan  PT Plan Current plan remains appropriate  PT Visit Diagnosis Other abnormalities of gait and mobility (R26.89);Difficulty in walking, not elsewhere classified (R26.2);Muscle weakness (generalized) (M62.81);Pain  Pain - Right/Left  (chest)  Pain - part of body  (chest)  PT Frequency (ACUTE ONLY) Min 3X/week  Follow Up Recommendations CIR;Supervision/Assistance - 24 hour  PT equipment None recommended by PT  AM-PAC PT "6 Clicks" Mobility Outcome Measure (Version 2)  Help needed turning from your back to your side while in a flat bed without using bedrails? 1  Help  needed moving from lying on your back to sitting on the  side of a flat bed without using bedrails? 1  Help needed moving to and from a bed to a chair (including a wheelchair)? 3  Help needed standing up from a chair using your arms (e.g., wheelchair or bedside chair)? 1  Help needed to walk in hospital room? 2  Help needed climbing 3-5 steps with a railing?  1  6 Click Score 9  Consider Recommendation of Discharge To: CIR/SNF/LTACH  PT Goal Progression  Progress towards PT goals Progressing toward goals  PT Time Calculation  PT Start Time (ACUTE ONLY) 1622  PT Stop Time (ACUTE ONLY) 1727  PT Time Calculation (min) (ACUTE ONLY) 65 min  PT General Charges  $$ ACUTE PT VISIT 1 Visit  PT Treatments  $Therapeutic Activity 53-67 mins   Acute Rehab  Pager: [phone]    Garwin Brothers, SPT  05/27/2020, 5:57 PM

## 2020-05-27 NOTE — Progress Notes (Addendum)
Patient ID: [name], male   DOB: 11/30/1955, 65 y.o.   MRN: [id number]     Advanced Heart Failure Rounding Note  PCP-Cardiologist: Sinclair Grooms, MD   Subjective:   - Extubated 5/22 - CHB episode 5/23, amiodarone stopped.  - 5/24 dobutamine stopped.   I/Os -2810 net on Lasix 40 mg IV bid yesterday.  Central line out.  No further heart block.   Objective:   Weight Range: 131.1 kg Body mass index is 40.33 kg/m.   Vital Signs:   Temp:  [98.1 F (36.7 C)-100.5 F (38.1 C)] 98.1 F (36.7 C) (05/25 0700) Pulse Rate:  [68-83] 83 (05/25 0800) Resp:  [13-22] 13 (05/25 0800) BP: (114-164)/(49-69) 126/60 (05/25 0800) SpO2:  [88 %-100 %] 100 % (05/25 0800) Arterial Line BP: (130)/(47) 130/47 (05/24 0900) Weight:  [131.1 kg] 131.1 kg (05/25 0500) Last BM Date: 05/24/20  Weight change: Filed Weights   05/24/20 0600 05/26/20 0500 05/27/20 0500  Weight: 133.8 kg 130.8 kg 131.1 kg    Intake/Output:   Intake/Output Summary (Last 24 hours) at 05/27/2020 9794 Last data filed at 05/27/2020 0800 Gross per 24 hour  Intake 1269.67 ml  Output 3940 ml  Net -2670.33 ml      Physical Exam   General: NAD Neck: Thick neck, no JVD, no thyromegaly or thyroid nodule.  Lungs: Clear to auscultation bilaterally with normal respiratory effort. CV: Nondisplaced PMI.  Heart regular S1/S2, no S3/S4, no murmur.  No peripheral edema..  Abdomen: Soft, nontender, no hepatosplenomegaly, no distention.  Skin: Intact without lesions or rashes.  Neurologic: Alert and oriented x 3.  Psych: Normal affect. Extremities: No clubbing or cyanosis.  HEENT: Normal.   Telemetry  NSR (personally reviewed)  Labs    CBC Recent Labs    05/25/20 0321 05/26/20 0346  WBC 9.1 7.6  HGB 11.5* 12.0*  HCT 34.8* 37.9*  MCV 88.5 91.3  PLT 182 801   Basic Metabolic Panel Recent Labs    05/25/20 0321 05/25/20 1354 05/26/20 0346 05/27/20 0102  NA 137   < > 141 141  K 3.4*   < > 3.7 3.7  CL 99    < > 104 104  CO2 27   < > 28 25  GLUCOSE 199*   < > 138* 148*  BUN 67*   < > 59* 54*  CREATININE 2.07*   < > 1.85* 1.80*  CALCIUM 7.3*   < > 8.0* 8.5*  MG 2.1  --   --   --   PHOS 4.5  --   --   --    < > = values in this interval not displayed.   Liver Function Tests No results for input(s): AST, ALT, ALKPHOS, BILITOT, PROT, ALBUMIN in the last 72 hours. No results for input(s): LIPASE, AMYLASE in the last 72 hours. Cardiac Enzymes No results for input(s): CKTOTAL, CKMB, CKMBINDEX, TROPONINI in the last 72 hours.  BNP: BNP (last 3 results) No results for input(s): BNP in the last 8760 hours.  ProBNP (last 3 results) No results for input(s): PROBNP in the last 8760 hours.   D-Dimer No results for input(s): DDIMER in the last 72 hours. Hemoglobin A1C No results for input(s): HGBA1C in the last 72 hours. Fasting Lipid Panel No results for input(s): CHOL, HDL, LDLCALC, TRIG, CHOLHDL, LDLDIRECT in the last 72 hours. Thyroid Function Tests No results for input(s): TSH, T4TOTAL, T3FREE, THYROIDAB in the last 72 hours.  Invalid input(s): FREET3  Other results:   Imaging    No results found.   Medications:     Scheduled Medications: . (feeding supplement) PROSource Plus  30 mL Oral TID BM  . aspirin  81 mg Oral Daily  . atorvastatin  80 mg Oral Daily  . carvedilol  3.125 mg Oral BID WC  . chlorhexidine gluconate (MEDLINE KIT)  15 mL Mouth Rinse BID  . Chlorhexidine Gluconate Cloth  6 each Topical Daily  . clopidogrel  75 mg Oral Daily  . docusate sodium  100 mg Oral BID  . furosemide  40 mg Intravenous Once  . hydrocortisone sod succinate (SOLU-CORTEF) inj  25 mg Intravenous Daily  . insulin aspart  0-20 Units Subcutaneous Q4H  . insulin detemir  34 Units Subcutaneous BID  . mouth rinse  15 mL Mouth Rinse BID  . multivitamin with minerals  1 tablet Oral Daily  . mupirocin ointment  1 application Nasal BID  . pantoprazole  40 mg Oral Daily  . polyethylene glycol   17 g Per Tube Daily  . potassium chloride  40 mEq Oral Once  . sodium chloride flush  3 mL Intravenous Q12H  . sodium chloride flush  3 mL Intravenous Q12H    Infusions: . sodium chloride Stopped (05/25/20 1831)  . sodium chloride    . dexmedetomidine (PRECEDEX) IV infusion Stopped (05/26/20 1033)  . DOBUTamine Stopped (05/26/20 1032)  . norepinephrine (LEVOPHED) Adult infusion Stopped (05/25/20 0914)    PRN Medications: sodium chloride, acetaminophen, ketotifen, lip balm, nitroGLYCERIN, ondansetron (ZOFRAN) IV, oxyCODONE, polyvinyl alcohol, sodium chloride flush, sodium chloride flush  Assessment/Plan   1. Shock: Primarily vasodilatory shock, suspect due to prolonged hypotension with arrest and tissue ischemia. Echo with LV EF in the 45% range with mildly dilated RV but function appeared relatively normal.  Resolved, now off dobutamine and pressors.  2. Acute on chronic HF with mid-rage EF: Ischemic cardiomyopathy, EF in the 45% range.  Patient was markedly volume overloaded, now diuresing well. CVL out, still diuresing well. Volume status looks ok at this point.   - Lasix 40 mg IV x 1 today with KCl replacement.  - No beta blocker with heart block until after device.  3. CAD: Patient appears to have CTO of the mid RCA with good collaterals.  I do not think this was acute MI from plaque rupture.  HS-TnI significantly elevated, but think this was generalized ischemia from hypotension with prolonged cardiac arrest.  - Continue ASA/Plavix.  - Continue statin.  - Off heparin gtt (enoxaparin Tennyson prophylaxis).  4. Cardiac arrest: VF arrest with immediate CPR but prolonged time to ROSC (about 45 minutes). Suspect scar-mediated from old RCA occlusion.  Stopped amio drip with 3rd degree.  - To get ICD today, with low EF would consider left bundle lead for possible future pacing.   4. AKI: Creatinine peaked 3.06-->2.86-->2.67-->2.34-->2.07->1.85-->1.8.  Suspect ATN in setting of prolonged cardiac  arrest.  5. Acute hypoxemic respiratory failure: CXR with bilateral upper lobe infiltrates likely due to aspiration with arrest.  Extubated 5/22. Sats stable on nasal cannula.  - He has completed antibiotics.  7. Elevated LFTs: Relatively mild considering down-time.  Shock liver.  Trending down.  8. Diabetes: Insulin.  9. Thrombocytopenia: Resolved.  10. 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 - ICD placement timing per EP, consider LB lead with low EF and possible need for pacing.  - No beta blocker until he has ICD.   Loralie Champagne 05/27/2020 8:12  AM   

## 2020-05-27 NOTE — Progress Notes (Signed)
Inpatient Rehab Admissions Coordinator:   I met with pt and his spouse, Arville Go, at bedside to discuss recommendations for CIR as well as review goals/expectations.  We discussed average length of stay to be about 2 weeks, but dependent upon progress while in rehab.  Goals for CIR for Mr. Fontenette would be supervision to mod I.  We will follow for possible admission pending [facility] approval and medical readiness.  Note plans for AICD placement this week.    Shann Medal, PT, DPT Admissions Coordinator [phone] 05/27/20  11:54 AM

## 2020-05-27 NOTE — Progress Notes (Signed)
NAME:  [name], MRN:  [id number], DOB:  09-23-1955, LOS: 8 ADMISSION DATE:  05/19/2020, CONSULTATION DATE: 05/19/2020 REFERRING MD: Dina Rich - ED Zacarias Pontes, CHIEF COMPLAINT: Cardiogenic shock  History of Present Illness:  65 year old man with PMHx significant for HTN, HLD, T2DM, GERD, chronic pain, RBBB and first degree AV block who presented with chest pain at [facility].  OOH VF arrest with immediate bystander CPR and EMS activation with > 45 minutes total CPR. Post arrest, felt to have inferior wall STEMI on EKG. Intubated, sedated and transferred to [facility] for potential PCI.  On arrival to [facility], EKG no longer concerning for inferior wall STEMI.  At that time, patient was awake and following commands on sedation. Initial plan to admit patient as NSTEMI and proceed to angiography in the morning following medical optimization.  Overnight 5/17, patient became hypotensive to SBP 80s with emesis x 1. Repeat EKG demonstrated marked ST elevation in the inferior leads. Patient was taken to cath lab where mid-RCA lesion (total occlusion) was noted but not stented (suspect chronic lesion due to adequate collateral flow). Patient returned to CVICU. Heparin and DAPT initiated for ACS. Remained hypotensive requiring initiation of multiple vasopressors for management of distributive shock.  Pertinent Medical History   Past Medical History:  Diagnosis Date  . Adenomatous polyp   . Cataract    left eye for sure   . Chronic pain   . Diabetes mellitus   . GERD (gastroesophageal reflux disease)   . History of colonic polyps   . History of hip replacement, total   . Hx of anaphylactic shock   . Hyperlipidemia   . Hypertension   . Left anterior hemiblock   . Obesity   . RBBB (right bundle branch block with left anterior fascicular block)   . 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 5/17 Admitted to [facility] as transfer from [place]. S/p VF arrest with 45 mins CPR before ROSC. Left femoral line inserted. C/f inferior STEMI. Coronary angiography with PCI RCA lesion, not stented, thought to be chronic . 5/18 Remains hypotensive on multiple pressors. LA and troponins downtrending. Weaning Giapreza. SVR and CO remain suboptimal . 5/19 Weaning DBA, Giapreza. Starting lasix gtt. Able to follow commands. Had an episode of VF, self-aborted. Was given additional amiodarone. Off Giapressa . 5/22 Weaning off pressors, extubated  Interim History / Subjective:   Breathing better. Up with PT again yesterday. Wife at bedside.   Objective   Blood pressure 126/60, pulse 83, temperature 98.1 F (36.7 C), temperature source Oral, resp. rate 13, height 5' 10.98" (1.803 m), weight 131.1 kg, SpO2 100 %.        Intake/Output Summary (Last 24 hours) at 05/27/2020 0847 Last data filed at 05/27/2020 0800 Gross per 24 hour  Intake 1269.67 ml  Output 3940 ml  Net -2670.33 ml   Filed Weights   05/24/20 0600 05/26/20 0500 05/27/20 0500  Weight: 133.8 kg 130.8 kg 131.1 kg   Physical Examination: Gen:    comfortable, resting in bed,  HEENT: tracking appropriately  Lungs:  CTAB, no wheeze, no crackles  CV:      RRR, s1 s2  Abd:   bs present, obese, soft, nt nd  Ext:BL le edema Skin:   Warm, no rash  Neuro: alert following commands, stood with PT yesterday   Labs/imaging  that I have personally reviewed: (right click and "Reselect all SmartList Selections" daily)   Sodium 141, potassium 3.7 Serum creatinine 1.85 Glucose 148  Resolved Hospital Problem list   None  Assessment & Plan:   Acute hypoxic respiratory failure secondary to acute pulmonary edema requiring mechanical ventilation, now extubated 05/24/2020 ARDS due to aspiration pneumonitis- improving Plan: Completed 7 days of abx  Observe   Cardiogenic shock secondary to acute coronary syndrome likely involving  the inferior wall requiring titration of vasoactive infusions VF cardiac arrest Echo LVEF 40-45%. S/p cath lab where mid-RCA lesion (total occlusion) was noted but not stented, suspected to be chronic due to well-developed collaterals. Plan: HF regimen per cardiology  Possible AICD planned per EP   Distributive shock with concern for mesenteric ischemia - resolved   Uncontrolled type 2 diabetes Plan: SSI + levemir   AKI Positive cumulative fluid balance Plan: Diuresis continued to maintain euvolemai   Elevated transaminases from shock liver, improving Supportive care   Hypertension Holding betablocker until after device placed   Back pain - prn oxycodone   Lines/tubes:  Removed foley  PIVS continued  No CVL   Best practice (right click and "Reselect all SmartList Selections" daily)  Diet:  Oral Pain/Anxiety/Delirium protocol (if indicated): No VAP protocol (if indicated): Not indicated DVT prophylaxis: LMWH GI prophylaxis: PPI Glucose control:  SSI Yes and Basal insulin Yes Central venous access:  N/A Arterial line:  N/A Foley:  N/A Mobility:  bed rest  PT consulted: Yes Last date of multidisciplinary goals of care discussion [wife updated at bedside 05/27/2020] Code Status:  full code  Disposition: ICU   Garner Nash, [place] 05/27/2020 8:47 AM

## 2020-05-27 NOTE — Progress Notes (Signed)
Progress Note  Patient Name: [name] Date of Encounter: 05/27/2020  [facility] Cardiologist: Sinclair Grooms, MD   Subjective   "I am doing OK", pretty weak in general, denies SOB, chest wall tenderness is improving, no CP otherwise.  Inpatient Medications    Scheduled Meds: . (feeding supplement) PROSource Plus  30 mL Oral TID BM  . aspirin  81 mg Oral Daily  . atorvastatin  80 mg Oral Daily  . chlorhexidine gluconate (MEDLINE KIT)  15 mL Mouth Rinse BID  . Chlorhexidine Gluconate Cloth  6 each Topical Daily  . clopidogrel  75 mg Oral Daily  . docusate sodium  100 mg Oral BID  . furosemide  40 mg Intravenous Once  . hydrocortisone sod succinate (SOLU-CORTEF) inj  25 mg Intravenous Daily  . insulin aspart  0-20 Units Subcutaneous Q4H  . insulin detemir  34 Units Subcutaneous BID  . mouth rinse  15 mL Mouth Rinse BID  . multivitamin with minerals  1 tablet Oral Daily  . mupirocin ointment  1 application Nasal BID  . pantoprazole  40 mg Oral Daily  . polyethylene glycol  17 g Per Tube Daily  . potassium chloride  40 mEq Oral Once  . sodium chloride flush  3 mL Intravenous Q12H  . sodium chloride flush  3 mL Intravenous Q12H   Continuous Infusions: . sodium chloride Stopped (05/25/20 1831)  . sodium chloride    . DOBUTamine Stopped (05/26/20 1032)   PRN Meds: sodium chloride, acetaminophen, ketotifen, lip balm, nitroGLYCERIN, ondansetron (ZOFRAN) IV, oxyCODONE, polyvinyl alcohol, sodium chloride flush, sodium chloride flush   Vital Signs    Vitals:   05/27/20 0500 05/27/20 0600 05/27/20 0700 05/27/20 0800  BP: (!) 131/57 (!) 140/58  126/60  Pulse: 77 78  83  Resp: $Remo'20 20  13  'iceoi$ Temp:   98.1 F (36.7 C)   TempSrc:   Oral   SpO2: 97% 98%  100%  Weight: 131.1 kg     Height:        Intake/Output Summary (Last 24 hours) at 05/27/2020 0855 Last data filed at 05/27/2020 0800 Gross per 24 hour  Intake 1269.67 ml  Output 3940 ml  Net -2670.33 ml   Last  3 Weights 05/27/2020 05/26/2020 05/24/2020  Weight (lbs) 289 lb 0.4 oz 288 lb 5.8 oz 294 lb 15.6 oz  Weight (kg) 131.1 kg 130.8 kg 133.8 kg      Telemetry    SR, no bradycardia or heart block - Personally Reviewed  ECG    No new EKGs - Personally Reviewed  Physical Exam   Exam is largely unchanged GEN: No acute distress.   Neck: No JVD, obese neck,  Cardiac: RRR, no murmurs, rubs, or gallops.  Respiratory: CTA b/l (ant/lat auscultation)  GI: Soft, nontender, non-distended, obese MS: No edema; No deformity. Neuro:  Nonfocal  Psych: Normal affect   Labs    High Sensitivity Troponin:   Recent Labs  Lab 05/20/20 0527 05/20/20 0842 05/20/20 1121 05/20/20 1240 05/20/20 1801  TROPONINIHS 10,906* 9,166* 8,258* 7,858* 5,624*      Chemistry Recent Labs  Lab 05/20/20 1121 05/20/20 1801 05/21/20 0205 05/21/20 0523 05/22/20 0250 05/23/20 0430 05/25/20 1354 05/26/20 0346 05/27/20 0102  NA 141   < > 138   < > 132*   < > 139 141 141  K 2.9*   < > 2.9*   < > 3.7   < > 3.3* 3.7 3.7  CL 104   < >  101   < > 95*   < > 101 104 104  CO2 21*   < > 26   < > 28   < > $R'27 28 25  'fd$ GLUCOSE 271*   < > 156*   < > 152*   < > 113* 138* 148*  BUN 30*   < > 29*   < > 31*   < > 66* 59* 54*  CREATININE 2.82*   < > 3.06*   < > 2.86*   < > 1.86* 1.85* 1.80*  CALCIUM 7.1*   < > 6.7*   < > 6.5*   < > 7.6* 8.0* 8.5*  PROT 4.6*  --  4.3*  --  4.7*  --   --   --   --   ALBUMIN 2.5*  --  2.2*  --  2.4*  --   --   --   --   AST 249*  --  131*  --  68*  --   --   --   --   ALT 234*  --  174*  --  134*  --   --   --   --   ALKPHOS 39  --  40  --  52  --   --   --   --   BILITOT 0.8  --  0.7  --  1.0  --   --   --   --   GFRNONAA 24*   < > 22*   < > 24*   < > 39* 40* 41*  ANIONGAP 16*   < > 11   < > 9   < > $R'11 9 12   'DI$ < > = values in this interval not displayed.     Hematology Recent Labs  Lab 05/24/20 0405 05/25/20 0321 05/26/20 0346  WBC 7.9 9.1 7.6  RBC 4.00* 3.93* 4.15*  HGB 11.6* 11.5*  12.0*  HCT 34.7* 34.8* 37.9*  MCV 86.8 88.5 91.3  MCH 29.0 29.3 28.9  MCHC 33.4 33.0 31.7  RDW 14.6 14.7 14.8  PLT 145* 182 232    BNPNo results for input(s): BNP, PROBNP in the last 168 hours.   DDimer No results for input(s): DDIMER in the last 168 hours.   Radiology    DG CHEST PORT 1 VIEW Result Date: 05/24/2020 CLINICAL DATA:  Shortness of breath and hypotension. EXAM: PORTABLE CHEST 1 VIEW COMPARISON:  05/23/2020 FINDINGS: Cardiomegaly, pulmonary vascular congestion and mild interstitial again noted. An endotracheal tube with tip 6.2 cm above the carina, NG/OG tube entering the stomach, and RIGHT subclavian central venous catheter with tip overlying the LOWER SVC again noted. There is no evidence of pneumothorax. Mild bibasilar atelectasis is again noted. Little interval changes noted from the prior study. IMPRESSION: Unchanged appearance of the chest with cardiomegaly, pulmonary vascular congestion, probable mild interstitial edema and bibasilar atelectasis. Electronically Signed   By: Margarette Canada M.D.   On: 05/24/2020 12:04    Cardiac Studies   Result Date: 05/19/2020  100% occlusion of the mid RCA, likely representing a chronic total occlusion given absence of thrombus staining in the area of occlusion and well-formed left-to-right collaterals.  Widely patent left main  Widely patent LAD  Widely patent circumflex  LVEDP 14 initially and following angiography and IV fluid 20 mmHg  RECOMMENDATIONS:  IV dobutamine to improve potential RV dysfunction.  IV fluids as tolerated  A central line will be placed in the unit to measure  co-oximetry and get right heart filling pressures.  Continue Levophed, epinephrine, IV fentanyl, and intermittent Versed as needed. Wean Levophed and epinephrine as tolerated.  Advanced heart failure team will help manage.  Critical care team, Dr. Doyne Keel managing primarily.   05/20/2020: TTE IMPRESSIONS  1. Left ventricular ejection fraction, by  estimation, is 40 to 45%. The  left ventricle has mildly decreased function. The left ventricle  demonstrates regional wall motion abnormalities (basal and mid  inferolateral suggestive of RCA disease). There is  mild concentric left ventricular hypertrophy. Left ventricular diastolic  parameters are indeterminate.  2. Right ventricular systolic function is hyperdynamic. The right  ventricular size is normal.  3. A small pericardial effusion is present.  4. The mitral valve is grossly normal. No evidence of mitral valve  regurgitation.  5. The aortic valve was not well visualized. Aortic valve regurgitation  is not visualized.  6. Aortic dilatation noted. There is borderline dilatation of the aortic  root, measuring 38 mm.  7. The inferior vena cava is dilated in size with <50% respiratory  variability, suggesting right atrial pressure of 15 mmHg.   Patient Profile     65 y.o. male with a hx of IDDM, HLD, HTN, HLD, who was admitted to [facility] 05/19/20 after a witnessed cardiac arrest    suffered witnessed out of hospital cardiac arrest 5/17 w/ immediate bystander CPR, initiated by wife, and prompt activation of EMS.  Wife reported EMS shocking him apparently multiple times Per report, had prolonged CPR, more than 45 min prior to ROSC. Underlying rhythm was VFib. Post arrest EKG suggested possibility of inferior MI.Portable bedside echoshowedinferobasal severe hypokinesis with septal bounce. RV systolic function normal.LVEF 40 to 50% range.  HS Trop 2566 > 9947 > 10906 > 9166 > 8258 > 7858 > 5624 K+ 4.3 > 3.0 > 2.7 > 2.7 > 2.6 > 2.5 > 2.9 >>>> 3.5 > 3.7 > 3.4  Mag 2.8 > 1.2 > 2.2 >>>> 2.1 Creat 1.29 >> 3.06 >>>> 2.07  Emergent cath showed100% occlusion of the mid RCA, likely representing a chronic total occlusion given absence of thrombus staining in the area of occlusion and well-formed left-to-right collaterals. No other disease. LM, LAD and LCx all widely patent. No  indication for PCI. He had cardiogenic shock, HF team was brought on board requiring multiple pressors, electrolytes supported/replaced, was also on hydrocortisone Was intubated but did not require mechanical cardiac support Started and maintained on amiodarone gtt Started on ceftriaxone/Flagyl for aspiration coverage EchowithLV EF in the 45% range with mildly dilated RV but function appearedrelatively normal. Abn LFTs and AKI felt 2/2 prolonged arrest Lasix gtt with excellent UOP Over the weekend started weaning off his pressors and he was extubated 5/22\ Weaned off pressors 5/22-23, developed CHB with prolonged V pause of 7 seconds and dobutamine resumed amiodarone gtt stopped EP consulted for ICD/pacing needs   Assessment & Plan    1. VF arrest (no EKGs/tracings of this)     Reportedly had prolonged CPR and multiple shocks by EMS 2. CAD 3. MI/ACS     100% RCA, felt to be CTO, not intervened      Cath as above     On ASA, statin 4. Shock     Off pressors  5. ICM     LVEF 40-45%     Diuresing well     HF team managing  Pt with RBBB for years, baseline QRS is 168ms and would not expect him to pace a lot.  Would plan for dual chamber ICD, he does not qualify for CRT   6. CHB     Baseline conduction system disease w/ IVCD <130 msec, and LAD     Regained 1:1 on dobutamine     He had transient complete heart block and subsequent mobitz I AV block on 5/23 morning and placed back on dobutamine     Dobutamine off yesterday about 1100 and his HR/rhythm have remained stable with 1:1 conduction, rates good   7. Respiratory failure, ARDS     Likely aspiration, pneumonitis - improving     Extubated 5/22     antibiotics completed yesterday    Was able to lay nearly flat yesterday  8. Pt is pleasant and follows commands appropriately, some degree of post arrest enephalopathy     Clearer again today     Wife is at bedside   Dr. Rayann Heman has seen the patient this morning and  d/w HF team. Pt wants full code and his status has been changed accordingly Will plan for ICD perhaps tomorrow pending his mobilization and clinical status/disposition timing, preferring to implant once close to discharge I will make him NPO and revisit tomorrow   For questions or updates, please contact [facility] Please consult [email or web address] for contact info under        Signed, Baldwin Jamaica, PA-C  05/27/2020, 8:55 AM

## 2020-05-27 NOTE — Plan of Care (Signed)
  Problem: Clinical Measurements: Goal: Respiratory complications will improve Outcome: Progressing   Problem: Coping: Goal: Level of anxiety will decrease Outcome: Progressing   Problem: Safety: Goal: Ability to remain free from injury will improve Outcome: Progressing   

## 2020-05-28 ENCOUNTER — Encounter (HOSPITAL_COMMUNITY)
Admission: EM | Disposition: A | Discharge status: Rehabilitation Facility | Payer: Self-pay | Source: Other Acute Inpatient Hospital | Attending: Family Medicine

## 2020-05-28 ENCOUNTER — Inpatient Hospital Stay (HOSPITAL_COMMUNITY): Payer: No Typology Code available for payment source

## 2020-05-28 DIAGNOSIS — I469 Cardiac arrest, cause unspecified: Secondary | ICD-10-CM | POA: Diagnosis not present

## 2020-05-28 DIAGNOSIS — I255 Ischemic cardiomyopathy: Secondary | ICD-10-CM

## 2020-05-28 DIAGNOSIS — R570 Cardiogenic shock: Secondary | ICD-10-CM

## 2020-05-28 DIAGNOSIS — I428 Other cardiomyopathies: Secondary | ICD-10-CM

## 2020-05-28 DIAGNOSIS — I509 Heart failure, unspecified: Secondary | ICD-10-CM

## 2020-05-28 HISTORY — PX: ICD IMPLANT: EP1208

## 2020-05-28 LAB — CBC
HCT: 41.5 % (ref 39.0–52.0)
Hemoglobin: 13.1 g/dL (ref 13.0–17.0)
MCH: 28.9 pg (ref 26.0–34.0)
MCHC: 31.6 g/dL (ref 30.0–36.0)
MCV: 91.6 fL (ref 80.0–100.0)
Platelets: 407 10*3/uL — ABNORMAL HIGH (ref 150–400)
RBC: 4.53 MIL/uL (ref 4.22–5.81)
RDW: 14.6 % (ref 11.5–15.5)
WBC: 10.7 10*3/uL — ABNORMAL HIGH (ref 4.0–10.5)
nRBC: 0 % (ref 0.0–0.2)

## 2020-05-28 LAB — GLUCOSE, CAPILLARY
Glucose-Capillary: 132 mg/dL — ABNORMAL HIGH (ref 70–99)
Glucose-Capillary: 146 mg/dL — ABNORMAL HIGH (ref 70–99)
Glucose-Capillary: 146 mg/dL — ABNORMAL HIGH (ref 70–99)
Glucose-Capillary: 152 mg/dL — ABNORMAL HIGH (ref 70–99)
Glucose-Capillary: 189 mg/dL — ABNORMAL HIGH (ref 70–99)
Glucose-Capillary: 215 mg/dL — ABNORMAL HIGH (ref 70–99)
Glucose-Capillary: 231 mg/dL — ABNORMAL HIGH (ref 70–99)
Glucose-Capillary: 290 mg/dL — ABNORMAL HIGH (ref 70–99)

## 2020-05-28 LAB — BASIC METABOLIC PANEL
Anion gap: 12 (ref 5–15)
BUN: 43 mg/dL — ABNORMAL HIGH (ref 8–23)
CO2: 27 mmol/L (ref 22–32)
Calcium: 8.7 mg/dL — ABNORMAL LOW (ref 8.9–10.3)
Chloride: 100 mmol/L (ref 98–111)
Creatinine, Ser: 1.6 mg/dL — ABNORMAL HIGH (ref 0.61–1.24)
GFR, Estimated: 47 mL/min — ABNORMAL LOW (ref >60)
Glucose, Bld: 160 mg/dL — ABNORMAL HIGH (ref 70–99)
Potassium: 3.8 mmol/L (ref 3.5–5.1)
Sodium: 139 mmol/L (ref 135–145)

## 2020-05-28 LAB — ECHOCARDIOGRAM LIMITED
Height: 70.984 in
Weight: 4670.22 oz

## 2020-05-28 SURGERY — ICD IMPLANT

## 2020-05-28 MED ORDER — PERFLUTREN LIPID MICROSPHERE
1.0000 mL | INTRAVENOUS | Status: AC | PRN
  Administered 2020-05-28: 10 mL via INTRAVENOUS
  Filled 2020-05-28: qty 10

## 2020-05-28 MED ORDER — PANTOPRAZOLE SODIUM 40 MG PO TBEC
40.0000 mg | DELAYED_RELEASE_TABLET | Freq: Two times a day (BID) | ORAL | Status: DC
  Administered 2020-05-28 – 2020-06-02 (×10): 40 mg via ORAL
  Filled 2020-05-28 (×10): qty 1

## 2020-05-28 MED ORDER — POTASSIUM CHLORIDE 20 MEQ PO PACK
40.0000 meq | PACK | Freq: Once | ORAL | Status: AC
  Administered 2020-05-28: 40 meq via ORAL
  Filled 2020-05-28: qty 2

## 2020-05-28 MED ORDER — SODIUM CHLORIDE 0.9 % IV SOLN
INTRAVENOUS | Status: AC
  Filled 2020-05-28: qty 2

## 2020-05-28 MED ORDER — MIDAZOLAM HCL 5 MG/5ML IJ SOLN
INTRAMUSCULAR | Status: AC
  Filled 2020-05-28: qty 5

## 2020-05-28 MED ORDER — HEPARIN (PORCINE) IN NACL 1000-0.9 UT/500ML-% IV SOLN
INTRAVENOUS | Status: AC
  Filled 2020-05-28: qty 500

## 2020-05-28 MED ORDER — FENTANYL CITRATE (PF) 100 MCG/2ML IJ SOLN
INTRAMUSCULAR | Status: AC
  Filled 2020-05-28: qty 2

## 2020-05-28 MED ORDER — DICLOFENAC SODIUM 1 % EX GEL
4.0000 g | Freq: Three times a day (TID) | CUTANEOUS | Status: DC | PRN
  Administered 2020-05-29: 4 g via TOPICAL
  Filled 2020-05-28 (×2): qty 100

## 2020-05-28 MED ORDER — FENTANYL CITRATE (PF) 100 MCG/2ML IJ SOLN
INTRAMUSCULAR | Status: DC | PRN
  Administered 2020-05-28 (×6): 12.5 ug via INTRAVENOUS

## 2020-05-28 MED ORDER — CEFAZOLIN IN SODIUM CHLORIDE 3-0.9 GM/100ML-% IV SOLN
3.0000 g | INTRAVENOUS | Status: AC
  Administered 2020-05-28: 3 g via INTRAVENOUS
  Filled 2020-05-28: qty 100

## 2020-05-28 MED ORDER — POTASSIUM CHLORIDE CRYS ER 20 MEQ PO TBCR
20.0000 meq | EXTENDED_RELEASE_TABLET | Freq: Every day | ORAL | Status: DC
  Administered 2020-05-28 – 2020-06-02 (×6): 20 meq via ORAL
  Filled 2020-05-28 (×6): qty 1

## 2020-05-28 MED ORDER — SODIUM CHLORIDE 0.9 % IV SOLN: INTRAVENOUS | Status: DC

## 2020-05-28 MED ORDER — HEPARIN (PORCINE) IN NACL 1000-0.9 UT/500ML-% IV SOLN
INTRAVENOUS | Status: DC | PRN
  Administered 2020-05-28: 500 mL

## 2020-05-28 MED ORDER — SODIUM CHLORIDE 0.9% FLUSH
3.0000 mL | Freq: Two times a day (BID) | INTRAVENOUS | Status: DC
  Administered 2020-05-28 – 2020-06-02 (×10): 3 mL via INTRAVENOUS

## 2020-05-28 MED ORDER — GABAPENTIN 100 MG PO CAPS
200.0000 mg | ORAL_CAPSULE | Freq: Two times a day (BID) | ORAL | Status: DC
  Administered 2020-05-28 – 2020-06-02 (×10): 200 mg via ORAL
  Filled 2020-05-28 (×11): qty 2

## 2020-05-28 MED ORDER — FUROSEMIDE 40 MG PO TABS
40.0000 mg | ORAL_TABLET | Freq: Every day | ORAL | Status: DC
  Administered 2020-05-28 – 2020-06-02 (×6): 40 mg via ORAL
  Filled 2020-05-28 (×6): qty 1

## 2020-05-28 MED ORDER — MIDAZOLAM HCL 5 MG/5ML IJ SOLN
INTRAMUSCULAR | Status: DC | PRN
  Administered 2020-05-28 (×6): 0.5 mg via INTRAVENOUS

## 2020-05-28 MED ORDER — SODIUM CHLORIDE 0.9% FLUSH: 3.0000 mL | INTRAVENOUS | Status: DC | PRN

## 2020-05-28 MED ORDER — SODIUM CHLORIDE 0.9 % IV SOLN
80.0000 mg | INTRAVENOUS | Status: AC
  Administered 2020-05-28: 80 mg
  Filled 2020-05-28: qty 2

## 2020-05-28 MED ORDER — CHLORHEXIDINE GLUCONATE 4 % EX LIQD: 60.0000 mL | Freq: Once | CUTANEOUS | Status: DC

## 2020-05-28 MED ORDER — FUROSEMIDE 10 MG/ML IJ SOLN
40.0000 mg | Freq: Once | INTRAMUSCULAR | Status: AC
  Administered 2020-05-28: 40 mg via INTRAVENOUS
  Filled 2020-05-28: qty 4

## 2020-05-28 MED ORDER — LIDOCAINE HCL (PF) 1 % IJ SOLN
INTRAMUSCULAR | Status: AC
  Filled 2020-05-28: qty 30

## 2020-05-28 MED ORDER — LIDOCAINE HCL (PF) 1 % IJ SOLN
INTRAMUSCULAR | Status: DC | PRN
  Administered 2020-05-28: 80 mL

## 2020-05-28 MED ORDER — CYCLOBENZAPRINE HCL 5 MG PO TABS
5.0000 mg | ORAL_TABLET | Freq: Three times a day (TID) | ORAL | Status: DC
  Administered 2020-05-28 – 2020-06-02 (×15): 5 mg via ORAL
  Filled 2020-05-28 (×15): qty 1

## 2020-05-28 MED ORDER — LIDOCAINE HCL (PF) 1 % IJ SOLN
INTRAMUSCULAR | Status: AC
  Filled 2020-05-28: qty 60

## 2020-05-28 SURGICAL SUPPLY — 10 items
CABLE SURGICAL S-101-97-12 (CABLE) ×2 IMPLANT
ICD EVERA DR XT MRI DDMB1D4 (ICD Generator) ×1 IMPLANT
LEAD CAPSURE NOVUS 5076-52CM (Lead) ×1 IMPLANT
LEAD SPRINT QUAT SEC 6935M-62 (Lead) ×1 IMPLANT
MAT PREVALON FULL STRYKER (MISCELLANEOUS) ×1 IMPLANT
PAD PRO RADIOLUCENT 2001M-C (PAD) ×2 IMPLANT
POUCH AIGIS-R ANTIBACT ICD (Mesh General) ×2 IMPLANT
POUCH AIGIS-R ANTIBACT ICD LRG (Mesh General) IMPLANT
SHEATH 7FR PRELUDE SNAP 13 (SHEATH) ×2 IMPLANT
TRAY PACEMAKER INSERTION (PACKS) ×2 IMPLANT

## 2020-05-28 NOTE — Hospital Course (Signed)
65 year old white male community dwelling Inferior wall STEMI VF arrest 05/19/2020 with 45-50 minutes CPR subsequent hypotension ST elevation inferior leads Cardiac cath = mid RCA total occlusion Rx Heparin DAPT Rx vasopressors for distributive shock 5/17 Admitted to [facility] as transfer from [place]. S/p VF arrest with 45 mins CPR before ROSC. Left femoral line inserted. C/f inferior STEMI. Coronary angiography with PCI RCA lesion, not stented, thought to be chronic 5/18 Remains hypotensive on multiple pressors. LA and troponins downtrending. Weaning Giapreza. SVR and CO remain suboptimal 5/19 Weaning DBA, Giapreza. Starting lasix gtt. Able to follow commands. Had an episode of VF, self-aborted. Was given additional amiodarone. Off Giapressa 5/22 Weaning off pressors, extubated 5/23 Episodic 3rd deg heart block-amiodarone stopped and has resolved LVEF 45%--> repeat echo 5/26 = 60-65% no regional wall motion abnormalities

## 2020-05-28 NOTE — Progress Notes (Signed)
Patient ID: [name], male   DOB: 06/04/1955, 65 y.o.   MRN: [id number]     Advanced Heart Failure Rounding Note  PCP-Cardiologist: Lesleigh Noe, MD   Subjective:   - Extubated 5/22 - CHB episode 5/23, amiodarone stopped.  - 5/24 dobutamine stopped.   I/Os negative yesterday with 1 dose IV Lasix.  No dyspnea, overall feeling weak.  No chest pain.   No further heart block noted on telemetry, has been off dobutamine.   Repeat limited echo with EF back up to 60-65%, normal RV.   Objective:   Weight Range: 132.4 kg Body mass index is 40.73 kg/m.   Vital Signs:   Temp:  [97.8 F (36.6 C)-98.7 F (37.1 C)] 98 F (36.7 C) (05/26 0829) Pulse Rate:  [79-81] 80 (05/26 0000) Resp:  [16-23] 16 (05/26 0000) BP: (114-153)/(48-89) 114/89 (05/26 0902) SpO2:  [94 %-99 %] 95 % (05/26 0902) Weight:  [132.4 kg] 132.4 kg (05/26 0500) Last BM Date: 05/28/20  Weight change: Filed Weights   05/27/20 0500 05/28/20 0043 05/28/20 0500  Weight: 131.1 kg 132.4 kg 132.4 kg    Intake/Output:   Intake/Output Summary (Last 24 hours) at 05/28/2020 1146 Last data filed at 05/28/2020 1012 Gross per 24 hour  Intake 1080 ml  Output 2955 ml  Net -1875 ml      Physical Exam   General: NAD, obese Neck: No JVD, no thyromegaly or thyroid nodule.  Lungs: Clear to auscultation bilaterally with normal respiratory effort. CV: Nondisplaced PMI.  Heart regular S1/S2, no S3/S4, no murmur.  No peripheral edema.   Abdomen: Soft, nontender, no hepatosplenomegaly, no distention.  Skin: Intact without lesions or rashes.  Neurologic: Alert and oriented x 3.  Psych: Normal affect. Extremities: No clubbing or cyanosis.  HEENT: Normal.    Telemetry  NSR (personally reviewed)  Labs    CBC Recent Labs    05/26/20 0346 05/28/20 0320  WBC 7.6 10.7*  HGB 12.0* 13.1  HCT 37.9* 41.5  MCV 91.3 91.6  PLT 232 407*   Basic Metabolic Panel Recent Labs    80/11/90 0102 05/28/20 0320  NA 141  139  K 3.7 3.8  CL 104 100  CO2 25 27  GLUCOSE 148* 160*  BUN 54* 43*  CREATININE 1.80* 1.60*  CALCIUM 8.5* 8.7*   Liver Function Tests No results for input(s): AST, ALT, ALKPHOS, BILITOT, PROT, ALBUMIN in the last 72 hours. No results for input(s): LIPASE, AMYLASE in the last 72 hours. Cardiac Enzymes No results for input(s): CKTOTAL, CKMB, CKMBINDEX, TROPONINI in the last 72 hours.  BNP: BNP (last 3 results) No results for input(s): BNP in the last 8760 hours.  ProBNP (last 3 results) No results for input(s): PROBNP in the last 8760 hours.   D-Dimer No results for input(s): DDIMER in the last 72 hours. Hemoglobin A1C No results for input(s): HGBA1C in the last 72 hours. Fasting Lipid Panel No results for input(s): CHOL, HDL, LDLCALC, TRIG, CHOLHDL, LDLDIRECT in the last 72 hours. Thyroid Function Tests No results for input(s): TSH, T4TOTAL, T3FREE, THYROIDAB in the last 72 hours.  Invalid input(s): FREET3  Other results:   Imaging    ECHOCARDIOGRAM LIMITED  Result Date: 05/28/2020    ECHOCARDIOGRAM LIMITED REPORT   Patient Name:   [name] Date of Exam: 05/28/2020 Medical Rec #:  [id number]       Height:       71.0 in Accession #:    [id number]  Weight:       291.9 lb Date of Birth:  26-Feb-1955       BSA:          2.476 m Patient Age:    51 years        BP:           147/61 mmHg Patient Gender: M               HR:           80 bpm. Exam Location:  Inpatient Procedure: 2D Echo, Cardiac Doppler and Color Doppler Indications:    Cardiomyopathy  History:        Patient has prior history of Echocardiogram examinations, most                 recent 05/20/2020. Risk Factors:Hypertension and Diabetes.  Sonographer:    Cammy Brochure Referring Phys: [id number] Vickie Epley  Sonographer Comments: Suboptimal apical window. Image acquisition challenging due to patient body habitus. IMPRESSIONS  1. Limited echo shows normal LVEF with no WMA. Normal RV function/size.  2.  Left ventricular ejection fraction, by estimation, is 60 to 65%. The left ventricle has normal function. The left ventricle has no regional wall motion abnormalities.  3. Right ventricular systolic function is normal. The right ventricular size is normal. FINDINGS  Left Ventricle: Left ventricular ejection fraction, by estimation, is 60 to 65%. The left ventricle has normal function. The left ventricle has no regional wall motion abnormalities. Definity contrast agent was given IV to delineate the left ventricular  endocardial borders. Right Ventricle: The right ventricular size is normal. No increase in right ventricular wall thickness. Right ventricular systolic function is normal. Left Atrium: Left atrial size was normal in size. Right Atrium: Right atrial size was normal in size. Pericardium: Trivial pericardial effusion is present. Presence of pericardial fat pad. Venous: The inferior vena cava was not well visualized. IAS/Shunts: The atrial septum is grossly normal. Eleonore Chiquito MD Electronically signed by Eleonore Chiquito MD Signature Date/Time: 05/28/2020/8:37:21 AM    Final      Medications:     Scheduled Medications: . aspirin  81 mg Oral Daily  . atorvastatin  80 mg Oral Daily  . chlorhexidine gluconate (MEDLINE KIT)  15 mL Mouth Rinse BID  . Chlorhexidine Gluconate Cloth  6 each Topical Daily  . docusate sodium  100 mg Oral BID  . insulin aspart  0-20 Units Subcutaneous Q4H  . insulin detemir  34 Units Subcutaneous BID  . mouth rinse  15 mL Mouth Rinse BID  . multivitamin with minerals  1 tablet Oral Daily  . mupirocin ointment  1 application Nasal BID  . pantoprazole  40 mg Oral Daily  . sodium chloride flush  3 mL Intravenous Q12H  . sodium chloride flush  3 mL Intravenous Q12H  . sodium chloride flush  3 mL Intravenous Q12H    Infusions: . sodium chloride Stopped (05/25/20 1831)  . sodium chloride 50 mL/hr at 05/28/20 1121    PRN Medications: sodium chloride, acetaminophen,  ketotifen, lip balm, nitroGLYCERIN, ondansetron (ZOFRAN) IV, oxyCODONE, polyvinyl alcohol, sodium chloride flush, sodium chloride flush  Assessment/Plan   1. Shock: Primarily vasodilatory shock, suspect due to prolonged hypotension with arrest and tissue ischemia. Echo with LV EF in the 45% range with mildly dilated RV but function appeared relatively normal.  Resolved, now off dobutamine and pressors.  2. Acute on chronic HF with mid-rage EF: Ischemic cardiomyopathy, EF in the 45%  range on initial echo but now back up to 60-65% on repeat limited echo 5/26.  Suspect stunning with arrest and now improved.  Patient was markedly volume overloaded, now diuresed well. Volume status looks ok at this point.   - Will start Lasix 40 mg po daily.  - No beta blocker with heart block until after device.  3. CAD: Patient appears to have CTO of the mid RCA with good collaterals.  I do not think this was acute MI from plaque rupture.  HS-TnI significantly elevated, but think this was generalized ischemia from hypotension with prolonged cardiac arrest.  - Continue ASA, Plavix on hold for ICD (can restart afterwards).   - Continue statin.  4. Cardiac arrest: VF arrest with immediate CPR but prolonged time to ROSC (about 45 minutes). Suspect scar-mediated from old RCA occlusion.  Stopped amio drip with 3rd degree. EF now normal.  - To get ICD today for secondary prevention.    4. AKI: Creatinine peaked 3.06-->2.86-->2.67-->2.34-->2.07->1.85-->1.8-->1.6.  Suspect ATN in setting of prolonged cardiac arrest.  5. Acute hypoxemic respiratory failure: CXR with bilateral upper lobe infiltrates likely due to aspiration with arrest.  Extubated 5/22. Sats stable on nasal cannula.  - He has completed antibiotics.  7. Elevated LFTs: Relatively mild considering down-time.  Shock liver.  Trending down.  8. Diabetes: Insulin.  9. Thrombocytopenia: Resolved.  10. 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  No further  HB.  - ICD placement today.  - No beta blocker until he has ICD.   Loralie Champagne 05/28/2020 11:46 AM

## 2020-05-28 NOTE — Progress Notes (Signed)
Inpatient Rehab Admissions Coordinator:   Note plans for possible device implantation today.  Also awaiting OT documentation to send to [facility] for insurance authorization.  Will continue to follow.   Shann Medal, PT, DPT Admissions Coordinator [phone] 05/28/20  9:52 AM

## 2020-05-28 NOTE — Evaluation (Signed)
Occupational Therapy Treatment Patient Details Name: [name] MRN: [id number] DOB: 04/08/1955 Today's Date: 05/28/2020    History of present illness 65 yo admitted 5/17 with OOH VF arrest with >45 min CPR. After arrival at [facility] pt with hypotension evening of 5/17 and emesis with STEMI. Intubated 5/17-5/22. PMhx: HTN, HLD, DM, GERD, chronic pain, RBBB, Left THA   OT comments  Pt presents with decline in function and safety with ADLs and ADL mobility with impaired strength, endurance and balance. Pt in be upon arrival with 2 NTs assisting him with bed level ADLs. Pt adamantly declined OOB activity due to fatigue and stating, "not today" he has a cardiac procedure later today, but agreeable to bed level eval and activity. Pt able to sit mod A +2 for less than 1 minute. PTA pt lived at home with his wife and was Ind with ADLs/selfcare, mobility and was driving. Near end of session pt became more talkative and was making jokes. Pt would benefit from acute OT services to address impairments to maximize level of function and safety  Follow Up Recommendations  CIR    Equipment Recommendations  Other (comment) (TBD at next venue of care)    Recommendations for Other Services      Precautions / Restrictions Precautions Precautions: Fall Precaution Comments: flow track, art line Restrictions Weight Bearing Restrictions: No       Mobility Bed Mobility Overal bed mobility: Needs Assistance Bed Mobility: Rolling;Supine to Sit;Sit to Supine Rolling: Mod assist   Supine to sit: Mod assist;+2 for physical assistance Sit to supine: Mod assist;+2 for physical assistance   General bed mobility comments: mod A for LE mgt and trunk elevation.    Transfers Overall transfer level: Needs assistance               General transfer comment: pt declined, per PT note 05/27/20 pt requird min A + 2 - min A    Balance Overall balance assessment: Needs assistance Sitting-balance support: Feet  unsupported;No upper extremity supported Sitting balance-Leahy Scale: Fair                                     ADL either performed or assessed with clinical judgement   ADL Overall ADL's : Needs assistance/impaired   Eating/Feeding Details (indicate cue type and reason): NPO but with strength and AROM WNL for self feeding Grooming: Wash/dry hands;Wash/dry face;Set up;Bed level   Upper Body Bathing: Minimal assistance;Bed level   Lower Body Bathing: Maximal assistance;Bed level   Upper Body Dressing : Minimal assistance;Bed level   Lower Body Dressing: Total assistance;Bed level     Toilet Transfer Details (indicate cue type and reason): declined Toileting- Clothing Manipulation and Hygiene: Total assistance;Bed level         General ADL Comments: pt adamantly declined OOB activity due to fatigue and that he'd rather not since he is having a cardiac procedure later today     Vision Patient Visual Report: No change from baseline     Perception     Praxis      Cognition Arousal/Alertness: Awake/alert Behavior During Therapy: WFL for tasks assessed/performed Overall Cognitive Status: Within Functional Limits for tasks assessed  Exercises     Shoulder Instructions       General Comments      Pertinent Vitals/ Pain       Pain Assessment: Faces Faces Pain Scale: Hurts a little bit Pain Location: chest Pain Descriptors / Indicators: Aching;Sore;Grimacing;Guarding Pain Intervention(s): Limited activity within patient's tolerance;Monitored during session  Home Living Family/patient expects to be discharged to:: Private residence Living Arrangements: Spouse/significant other Available Help at Discharge: Family;Available 24 hours/day Type of Home: House Home Access: Stairs to enter [employer] of Steps: 1   Home Layout: One level     Bathroom Shower/Tub: Animal nutritionist: Standard     Home Equipment: Environmental consultant - 2 wheels;Bedside commode          Prior Functioning/Environment Level of Independence: Independent        Comments: Ind with ADLs, mobility and was driving   Frequency  Min 2X/week        Progress Toward Goals  OT Goals(current goals can now be found in the care plan section)     Acute Rehab OT Goals Patient Stated Goal: Get better and go home OT Goal Formulation: With patient/family Time For Goal Achievement: 06/11/20 Potential to Achieve Goals: Good ADL Goals Pt Will Perform Grooming: with min guard assist;with supervision;sitting Pt Will Perform Upper Body Bathing: with min guard assist;with supervision Pt Will Perform Lower Body Bathing: with max assist;sitting/lateral leans Pt Will Perform Upper Body Dressing: with min guard assist;with supervision;sitting Pt Will Transfer to Toilet: with min assist;with min guard assist;stand pivot transfer;ambulating;bedside commode;regular height toilet;grab bars Pt Will Perform Toileting - Clothing Manipulation and hygiene: with mod assist;with min assist;sitting/lateral leans;sit to/from stand  Plan      Co-evaluation                 AM-PAC OT "6 Clicks" Daily Activity     Outcome Measure   Help from another person eating meals?: None Help from another person taking care of personal grooming?: A Little Help from another person toileting, which includes using toliet, bedpan, or urinal?: Total Help from another person bathing (including washing, rinsing, drying)?: A Lot Help from another person to put on and taking off regular upper body clothing?: A Lot Help from another person to put on and taking off regular lower body clothing?: Total 6 Click Score: 13    End of Session    OT Visit Diagnosis: Other abnormalities of gait and mobility (R26.89);Muscle weakness (generalized) (M62.81)   Activity Tolerance Patient limited by fatigue   Patient Left in  bed;with call bell/phone within reach;with nursing/sitter in room;with family/visitor present   Nurse Communication          Time: 2423-5361 OT Time Calculation (min): 18 min  Charges: OT General Charges $OT Visit: 1 Visit OT Evaluation $OT Eval Moderate Complexity: 1 Mod     Britt Bottom 05/28/2020, 2:24 PM

## 2020-05-28 NOTE — Progress Notes (Signed)
PROGRESS NOTE   [name]  IRJ:[id number] DOB: Mar 15, 1955 DOA: 05/19/2020 PCP: [facility]  Brief Narrative:  65 year old white male community dwelling Inferior wall STEMI VF arrest 05/19/2020 with 45-50 minutes CPR subsequent hypotension ST elevation inferior leads Cardiac cath = mid RCA total occlusion Rx Heparin DAPT Rx vasopressors for distributive shock  5/17 Admitted to [facility] as transfer from [place]. S/p VF arrest with 45 mins CPR before ROSC. Left femoral line inserted. C/f inferior STEMI. 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 LA and troponins downtrending. Weaning Giapreza. SVR and CO remain suboptimal  5/19 Weaning DBA, Giapreza. Starting lasix gtt. Able to follow commands. Had an episode of VF, self-aborted. Was given additional amiodarone. Off Giapressa  5/22 Weaning off pressors, extubated  5/23 Episodic 3rd deg heart block-amiodarone stopped and has resolved  LVEF 45%--> repeat echo 5/26 = 60-65% no regional wall motion abnormalities  Hospital-Problem based course Hypovolemic distributive shock pressors off, physiology resolved Current meds Lasix 40 daily Defer the rest to cardiology VF arrest status post CPR 45 minutes episodic third-degree heart block while on amiodarone Rest as per cardiology Going for PPM today as per cardiology OSA not compliant on CPAP Possible substrate for arrhythmias? Has machine at home not compliant on use-wife to let me know which agency he uses we will enlist TOC to help with ensuring that machine is adequately working DM TY 2 Continue current Levemir 34 twice daily (home meds 80 at bedtime) and resistant sliding scale Hold at this time Jardiance 12.5 daily Resume slowly gabapentin at lower dose 200 4 times daily HTN Holding at this time Prinzide 1 daily BMI 40 Enlist help of nutritionist-patient interested in weight loss exercise and  diet counseling-admits to indiscretions with diet   DVT prophylaxis: SCDs currently Code Status: Full Family Communication: Discussed with wife Mechele Claude at bedside-her number [phone] Disposition:  Status is: Inpatient  Remains inpatient appropriate because:Hemodynamically unstable, IV treatments appropriate due to intensity of illness or inability to take PO and Inpatient level of care appropriate due to severity of illness   Dispo: The patient is from: Home              Anticipated d/c is to: CIR              Patient currently is not medically stable to d/c.   Difficult to place patient No       Consultants:   Cardiology  EP  Procedures: Multiple  Antimicrobials: Perioperative only   Subjective: Feels a little bit tired Tells me several episodes of abdominal pain Loose stool No chest pain no fever no chills  Objective: Vitals:   05/28/20 0500 05/28/20 0518 05/28/20 0829 05/28/20 0902  BP:    114/89  Pulse:      Resp:      Temp:  97.8 F (36.6 C) 98 F (36.7 C)   TempSrc:  Oral Oral   SpO2:    95%  Weight: 132.4 kg     Height:        Intake/Output Summary (Last 24 hours) at 05/28/2020 1312 Last data filed at 05/28/2020 1200 Gross per 24 hour  Intake 1080 ml  Output 2705 ml  Net -1625 ml   Filed Weights   05/27/20 0500 05/28/20 0043 05/28/20 0500  Weight: 131.1 kg 132.4 kg 132.4 kg    Examination:  EOMI NCAT thick neck Mallampati 4 poor dentition edentulous No JVD  at bedside S1-S2 sinus rhythm QTC seems prolonged Abdomen obese slight right upper quadrant tenderness Trace lower extremity edema No anasarca no fluid wave Chest relatively clear no added sound Neurologically intact  Data Reviewed: personally reviewed   CBC    Component Value Date/Time   WBC 10.7 (H) 05/28/2020 0320   RBC 4.53 05/28/2020 0320   HGB 13.1 05/28/2020 0320   HCT 41.5 05/28/2020 0320   PLT 407 (H) 05/28/2020 0320   MCV 91.6 05/28/2020 0320   MCH 28.9  05/28/2020 0320   MCHC 31.6 05/28/2020 0320   RDW 14.6 05/28/2020 0320   LYMPHSABS 4.1 (H) 05/19/2020 1818   MONOABS 0.7 05/19/2020 1818   EOSABS 0.1 05/19/2020 1818   BASOSABS 0.1 05/19/2020 1818   CMP Latest Ref Rng & Units 05/28/2020 05/27/2020 05/26/2020  Glucose 70 - 99 mg/dL 160(H) 148(H) 138(H)  BUN 8 - 23 mg/dL 43(H) 54(H) 59(H)  Creatinine 0.61 - 1.24 mg/dL 1.60(H) 1.80(H) 1.85(H)  Sodium 135 - 145 mmol/L 139 141 141  Potassium 3.5 - 5.1 mmol/L 3.8 3.7 3.7  Chloride 98 - 111 mmol/L 100 104 104  CO2 22 - 32 mmol/L $RemoveB'27 25 28  'UvfYrCbj$ Calcium 8.9 - 10.3 mg/dL 8.7(L) 8.5(L) 8.0(L)  Total Protein 6.5 - 8.1 g/dL - - -  Total Bilirubin 0.3 - 1.2 mg/dL - - -  Alkaline Phos 38 - 126 U/L - - -  AST 15 - 41 U/L - - -  ALT 0 - 44 U/L - - -     Radiology Studies: ECHOCARDIOGRAM LIMITED  Result Date: 05/28/2020    ECHOCARDIOGRAM LIMITED REPORT   Patient Name:   [name] Date of Exam: 05/28/2020 Medical Rec #:  [id number]       Height:       71.0 in Accession #:    [id number]      Weight:       291.9 lb Date of Birth:  01/05/1955       BSA:          2.476 m Patient Age:    59 years        BP:           147/61 mmHg Patient Gender: M               HR:           80 bpm. Exam Location:  Inpatient Procedure: 2D Echo, Cardiac Doppler and Color Doppler Indications:    Cardiomyopathy  History:        Patient has prior history of Echocardiogram examinations, most                 recent 05/20/2020. Risk Factors:Hypertension and Diabetes.  Sonographer:    Cammy Brochure Referring Phys: [id number] Vickie Epley  Sonographer Comments: Suboptimal apical window. Image acquisition challenging due to patient body habitus. IMPRESSIONS  1. Limited echo shows normal LVEF with no WMA. Normal RV function/size.  2. Left ventricular ejection fraction, by estimation, is 60 to 65%. The left ventricle has normal function. The left ventricle has no regional wall motion abnormalities.  3. Right ventricular systolic function is  normal. The right ventricular size is normal. FINDINGS  Left Ventricle: Left ventricular ejection fraction, by estimation, is 60 to 65%. The left ventricle has normal function. The left ventricle has no regional wall motion abnormalities. Definity contrast agent was given IV to delineate the left ventricular  endocardial borders. Right Ventricle: The right ventricular size is  normal. No increase in right ventricular wall thickness. Right ventricular systolic function is normal. Left Atrium: Left atrial size was normal in size. Right Atrium: Right atrial size was normal in size. Pericardium: Trivial pericardial effusion is present. Presence of pericardial fat pad. Venous: The inferior vena cava was not well visualized. IAS/Shunts: The atrial septum is grossly normal. Eleonore Chiquito MD Electronically signed by Eleonore Chiquito MD Signature Date/Time: 05/28/2020/8:37:21 AM    Final      Scheduled Meds: . aspirin  81 mg Oral Daily  . atorvastatin  80 mg Oral Daily  . chlorhexidine  60 mL Topical Once  . chlorhexidine  60 mL Topical Once  . chlorhexidine gluconate (MEDLINE KIT)  15 mL Mouth Rinse BID  . Chlorhexidine Gluconate Cloth  6 each Topical Daily  . cyclobenzaprine  5 mg Oral TID  . docusate sodium  100 mg Oral BID  . furosemide  40 mg Oral Daily  . gabapentin  200 mg Oral BID  . gentamicin irrigation  80 mg Irrigation On Call  . insulin aspart  0-20 Units Subcutaneous Q4H  . insulin detemir  34 Units Subcutaneous BID  . mouth rinse  15 mL Mouth Rinse BID  . multivitamin with minerals  1 tablet Oral Daily  . mupirocin ointment  1 application Nasal BID  . pantoprazole  40 mg Oral Daily  . pantoprazole  40 mg Oral BID AC  . potassium chloride  20 mEq Oral Daily  . sodium chloride flush  3 mL Intravenous Q12H  . sodium chloride flush  3 mL Intravenous Q12H  . sodium chloride flush  3 mL Intravenous Q12H   Continuous Infusions: . sodium chloride Stopped (05/25/20 1831)  . sodium chloride  Stopped (05/28/20 1200)  . sodium chloride    .  ceFAZolin (ANCEF) IV       LOS: 9 days   Time spent: [place], MD [facility] To contact the attending provider between 7A-7P or the covering provider during after hours 7P-7A, please log into the web site [email or web address] and access using universal [place] password for that web site. If you do not have the password, please call the hospital operator.  05/28/2020, 1:12 PM

## 2020-05-28 NOTE — Progress Notes (Signed)
Progress Note  Patient Name: [name] Date of Encounter: 05/28/2020  [facility] Cardiologist: Sinclair Grooms, MD   Subjective   "I am weak", no CP outside of chest wall tenderness that is slowly improving, cough is better, gets winded with exertion.  Stood some with PT yesterday  Inpatient Medications    Scheduled Meds: . (feeding supplement) PROSource Plus  30 mL Oral TID BM  . aspirin  81 mg Oral Daily  . atorvastatin  80 mg Oral Daily  . chlorhexidine gluconate (MEDLINE KIT)  15 mL Mouth Rinse BID  . Chlorhexidine Gluconate Cloth  6 each Topical Daily  . docusate sodium  100 mg Oral BID  . furosemide  40 mg Intravenous Once  . insulin aspart  0-20 Units Subcutaneous Q4H  . insulin detemir  34 Units Subcutaneous BID  . mouth rinse  15 mL Mouth Rinse BID  . multivitamin with minerals  1 tablet Oral Daily  . mupirocin ointment  1 application Nasal BID  . pantoprazole  40 mg Oral Daily  . potassium chloride  40 mEq Oral Once  . sodium chloride flush  3 mL Intravenous Q12H  . sodium chloride flush  3 mL Intravenous Q12H   Continuous Infusions: . sodium chloride Stopped (05/25/20 1831)  . sodium chloride Stopped (05/27/20 2300)   PRN Meds: sodium chloride, acetaminophen, ketotifen, lip balm, nitroGLYCERIN, ondansetron (ZOFRAN) IV, oxyCODONE, perflutren lipid microspheres (DEFINITY) IV suspension, polyvinyl alcohol, sodium chloride flush, sodium chloride flush   Vital Signs    Vitals:   05/28/20 0043 05/28/20 0500 05/28/20 0518 05/28/20 0829  BP:      Pulse:      Resp:      Temp:   97.8 F (36.6 C) 98 F (36.7 C)  TempSrc:   Oral Oral  SpO2:      Weight: 132.4 kg 132.4 kg    Height:        Intake/Output Summary (Last 24 hours) at 05/28/2020 0835 Last data filed at 05/28/2020 0109 Gross per 24 hour  Intake 1320 ml  Output 3275 ml  Net -1955 ml   Last 3 Weights 05/28/2020 05/28/2020 05/27/2020  Weight (lbs) 291 lb 14.2 oz 291 lb 14.2 oz 289 lb 0.4  oz  Weight (kg) 132.4 kg 132.4 kg 131.1 kg      Telemetry    SR, no bradycardia or heart block, infrequent PVCs, rare couplet, one 3 beat salvo noted - Personally Reviewed  ECG    No new EKGs - Personally Reviewed  Physical Exam   Examined by Dr. Quentin Ore GEN: No acute distress.   Neck: No JVD, obese neck,  Cardiac: RRR, no murmurs, rubs, or gallops.  Respiratory: CTA b/l (ant/lat auscultation)  GI: Soft, nontender, non-distended, obese MS: No edema; No deformity. Neuro:  Nonfocal  Psych: Normal affect   Labs    High Sensitivity Troponin:   Recent Labs  Lab 05/20/20 0527 05/20/20 0842 05/20/20 1121 05/20/20 1240 05/20/20 1801  TROPONINIHS 10,906* 9,166* 8,258* 7,858* 5,624*      Chemistry Recent Labs  Lab 05/22/20 0250 05/23/20 0430 05/26/20 0346 05/27/20 0102 05/28/20 0320  NA 132*   < > 141 141 139  K 3.7   < > 3.7 3.7 3.8  CL 95*   < > 104 104 100  CO2 28   < > $R'28 25 27  'kJ$ GLUCOSE 152*   < > 138* 148* 160*  BUN 31*   < > 59* 54* 43*  CREATININE 2.86*   < > 1.85* 1.80* 1.60*  CALCIUM 6.5*   < > 8.0* 8.5* 8.7*  PROT 4.7*  --   --   --   --   ALBUMIN 2.4*  --   --   --   --   AST 68*  --   --   --   --   ALT 134*  --   --   --   --   ALKPHOS 52  --   --   --   --   BILITOT 1.0  --   --   --   --   GFRNONAA 24*   < > 40* 41* 47*  ANIONGAP 9   < > $R'9 12 12   'Bm$ < > = values in this interval not displayed.     Hematology Recent Labs  Lab 05/25/20 0321 05/26/20 0346 05/28/20 0320  WBC 9.1 7.6 10.7*  RBC 3.93* 4.15* 4.53  HGB 11.5* 12.0* 13.1  HCT 34.8* 37.9* 41.5  MCV 88.5 91.3 91.6  MCH 29.3 28.9 28.9  MCHC 33.0 31.7 31.6  RDW 14.7 14.8 14.6  PLT 182 232 407*    BNPNo results for input(s): BNP, PROBNP in the last 168 hours.   DDimer No results for input(s): DDIMER in the last 168 hours.   Radiology    DG CHEST PORT 1 VIEW Result Date: 05/24/2020 CLINICAL DATA:  Shortness of breath and hypotension. EXAM: PORTABLE CHEST 1 VIEW COMPARISON:   05/23/2020 FINDINGS: Cardiomegaly, pulmonary vascular congestion and mild interstitial again noted. An endotracheal tube with tip 6.2 cm above the carina, NG/OG tube entering the stomach, and RIGHT subclavian central venous catheter with tip overlying the LOWER SVC again noted. There is no evidence of pneumothorax. Mild bibasilar atelectasis is again noted. Little interval changes noted from the prior study. IMPRESSION: Unchanged appearance of the chest with cardiomegaly, pulmonary vascular congestion, probable mild interstitial edema and bibasilar atelectasis. Electronically Signed   By: Margarette Canada M.D.   On: 05/24/2020 12:04    Cardiac Studies   Result Date: 05/19/2020  100% occlusion of the mid RCA, likely representing a chronic total occlusion given absence of thrombus staining in the area of occlusion and well-formed left-to-right collaterals.  Widely patent left main  Widely patent LAD  Widely patent circumflex  LVEDP 14 initially and following angiography and IV fluid 20 mmHg  RECOMMENDATIONS:  IV dobutamine to improve potential RV dysfunction.  IV fluids as tolerated  A central line will be placed in the unit to measure co-oximetry and get right heart filling pressures.  Continue Levophed, epinephrine, IV fentanyl, and intermittent Versed as needed. Wean Levophed and epinephrine as tolerated.  Advanced heart failure team will help manage.  Critical care team, Dr. Doyne Keel managing primarily.   05/20/2020: TTE IMPRESSIONS  1. Left ventricular ejection fraction, by estimation, is 40 to 45%. The  left ventricle has mildly decreased function. The left ventricle  demonstrates regional wall motion abnormalities (basal and mid  inferolateral suggestive of RCA disease). There is  mild concentric left ventricular hypertrophy. Left ventricular diastolic  parameters are indeterminate.  2. Right ventricular systolic function is hyperdynamic. The right  ventricular size is normal.  3. A  small pericardial effusion is present.  4. The mitral valve is grossly normal. No evidence of mitral valve  regurgitation.  5. The aortic valve was not well visualized. Aortic valve regurgitation  is not visualized.  6. Aortic dilatation noted. There is borderline dilatation  of the aortic  root, measuring 38 mm.  7. The inferior vena cava is dilated in size with <50% respiratory  variability, suggesting right atrial pressure of 15 mmHg.   Patient Profile     65 y.o. male with a hx of IDDM, HLD, HTN, HLD, who was admitted to [facility] 05/19/20 after a witnessed cardiac arrest    suffered witnessed out of hospital cardiac arrest 5/17 w/ immediate bystander CPR, initiated by wife, and prompt activation of EMS.  Wife reported EMS shocking him apparently multiple times Per report, had prolonged CPR, more than 45 min prior to ROSC. Underlying rhythm was VFib. Post arrest EKG suggested possibility of inferior MI.Portable bedside echoshowedinferobasal severe hypokinesis with septal bounce. RV systolic function normal.LVEF 40 to 50% range.  HS Trop 2566 > 9947 > 10906 > 9166 > 8258 > 7858 > 5624 K+ 4.3 > 3.0 > 2.7 > 2.7 > 2.6 > 2.5 > 2.9 >>>> 3.5 > 3.7 > 3.4  Mag 2.8 > 1.2 > 2.2 >>>> 2.1 Creat 1.29 >> 3.06 >>>> 2.07  Emergent cath showed100% occlusion of the mid RCA, likely representing a chronic total occlusion given absence of thrombus staining in the area of occlusion and well-formed left-to-right collaterals. No other disease. LM, LAD and LCx all widely patent. No indication for PCI. He had cardiogenic shock, HF team was brought on board requiring multiple pressors, electrolytes supported/replaced, was also on hydrocortisone Was intubated but did not require mechanical cardiac support Started and maintained on amiodarone gtt Started on ceftriaxone/Flagyl for aspiration coverage EchowithLV EF in the 45% range with mildly dilated RV but function appearedrelatively normal. Abn LFTs and  AKI felt 2/2 prolonged arrest Lasix gtt with excellent UOP Over the weekend started weaning off his pressors and he was extubated 5/22\ Weaned off pressors 5/22-23, developed CHB with prolonged V pause of 7 seconds and dobutamine resumed amiodarone gtt stopped EP consulted for ICD/pacing needs   Assessment & Plan    1. VF arrest (no EKGs/tracings of this)     Reportedly had prolonged CPR and multiple shocks by EMS 2. CAD 3. MI/ACS     100% RCA, felt to be CTO, not intervened      Cath as above     On ASA, statin, off hep gtt 4. Shock     Off pressors now a couple days  Hold plavix for implant   5. ICM     LVEF 40-45%     Diuresing well     Cumulatively neg 9337ml     HF team managing  Repeat echo this AM   6. CHB     Baseline conduction system disease w/ IVCD <130 msec, and LAD     Regained 1:1 on dobutamine     He had transient complete heart block and subsequent mobitz I AV block.     Off dobutamine a couple days, no recurrent heart block     Baseline conduction system disease for years   7. Respiratory failure, ARDS     Likely aspiration, pneumonitis - improving     Extubated 5/22     completed antibiotics     Cough is better   8. Pt is pleasant and follows commands appropriately, some degree of post arrest enephalopathy     Resolved 9. functional decline post arrest and prolonged bed rest     PT is working with him, rec CIR, they saw him yesterday       Dr. Quentin Ore has seen and examined  the patient today Plan limited echo to help device type decision D/w patient implant today, the patient while agreeable to the implant is a bit hesitant about going today, feels he may be too weak to tolerate the procedure. We will keep him NPO and revisit later this AM   For questions or updates, please contact [place] Please consult [email or web address] for contact info under        Signed, Baldwin [place], PA-C  05/28/2020, 8:35 AM

## 2020-05-28 NOTE — Progress Notes (Signed)
Nutrition Follow-up  DOCUMENTATION CODES:   Not applicable  INTERVENTION:   -D/c Prosource Plus -Double protein portions with meals -MVI with minerals daily  NUTRITION DIAGNOSIS:   Increased nutrient needs related to acute illness as evidenced by estimated needs.  Ongoing  GOAL:   Patient will meet greater than or equal to 90% of their needs  Progressing   MONITOR:   Diet advancement,PO intake,Supplement acceptance,Labs,Weight trends,Skin,I & O's  REASON FOR ASSESSMENT:   Ventilator    ASSESSMENT:   Patient with PMH significant for DM, GERD, colonic polyps, s/p total hip replacement, HLD, and HTN. Presents this admission with VF cardiac arrest.  5/22 - weaning off pressors; extubated  Reviewed I/O's: -1.8 L x 24 hours and -14.1 L since admission  UOP: 2.4 L x 24 hours  Pt unavailable at time of visit. Plan for ICD today.   Pt with good appetite. Noted meal completion 100%. He is refusing Prosource, as he does not like the taste of it.   Medications reviewed and include colace and lasix.  Labs reviewed: CBGS: 250-037 (inpatient orders for glycemic control are 0-20 units insulin aspart every 4 hours and 34 units inuslin detemir BID).   Diet Order:   Diet Order            Diet NPO time specified Except for: Sips with Meds  Diet effective midnight                 EDUCATION NEEDS:   Not appropriate for education at this time  Skin:  Skin Assessment: Reviewed RN Assessment  Last BM:  05/28/20  Height:   Ht Readings from Last 1 Encounters:  05/20/20 5' 10.98" (1.803 m)    Weight:   Wt Readings from Last 1 Encounters:  05/28/20 132.4 kg   BMI:  Body mass index is 40.73 kg/m.  Estimated Nutritional Needs:   Kcal:  2050-2250  Protein:  110-125 grams  Fluid:  >2 L    Loistine Chance, RD, LDN, [place] Registered Dietitian II Certified Diabetes Care and Education Specialist Please refer to [facility] for RD and/or RD on-call/weekend/after hours  pager

## 2020-05-28 NOTE — Care Management Important Message (Signed)
Important Message  Patient Details  Name: [name] MRN: [id number] Date of Birth: [birth year]   Medicare Important Message Given:  Yes     Shelda Altes 05/28/2020, 10:40 AM

## 2020-05-29 ENCOUNTER — Encounter (HOSPITAL_COMMUNITY): Payer: Self-pay | Admitting: Cardiology

## 2020-05-29 ENCOUNTER — Inpatient Hospital Stay (HOSPITAL_COMMUNITY): Payer: No Typology Code available for payment source

## 2020-05-29 DIAGNOSIS — I509 Heart failure, unspecified: Secondary | ICD-10-CM | POA: Diagnosis not present

## 2020-05-29 DIAGNOSIS — Z95810 Presence of automatic (implantable) cardiac defibrillator: Secondary | ICD-10-CM

## 2020-05-29 DIAGNOSIS — I469 Cardiac arrest, cause unspecified: Secondary | ICD-10-CM | POA: Diagnosis not present

## 2020-05-29 DIAGNOSIS — I255 Ischemic cardiomyopathy: Secondary | ICD-10-CM | POA: Diagnosis not present

## 2020-05-29 LAB — CBC
HCT: 44.5 % (ref 39.0–52.0)
Hemoglobin: 14.5 g/dL (ref 13.0–17.0)
MCH: 29 pg (ref 26.0–34.0)
MCHC: 32.6 g/dL (ref 30.0–36.0)
MCV: 89 fL (ref 80.0–100.0)
Platelets: 465 10*3/uL — ABNORMAL HIGH (ref 150–400)
RBC: 5 MIL/uL (ref 4.22–5.81)
RDW: 14.6 % (ref 11.5–15.5)
WBC: 11.9 10*3/uL — ABNORMAL HIGH (ref 4.0–10.5)
nRBC: 0 % (ref 0.0–0.2)

## 2020-05-29 LAB — GLUCOSE, CAPILLARY
Glucose-Capillary: 142 mg/dL — ABNORMAL HIGH (ref 70–99)
Glucose-Capillary: 152 mg/dL — ABNORMAL HIGH (ref 70–99)
Glucose-Capillary: 139 mg/dL — ABNORMAL HIGH (ref 70–99)
Glucose-Capillary: 245 mg/dL — ABNORMAL HIGH (ref 70–99)
Glucose-Capillary: 261 mg/dL — ABNORMAL HIGH (ref 70–99)
Glucose-Capillary: 274 mg/dL — ABNORMAL HIGH (ref 70–99)

## 2020-05-29 LAB — BASIC METABOLIC PANEL
Anion gap: 8 (ref 5–15)
BUN: 39 mg/dL — ABNORMAL HIGH (ref 8–23)
CO2: 28 mmol/L (ref 22–32)
Calcium: 8.8 mg/dL — ABNORMAL LOW (ref 8.9–10.3)
Chloride: 102 mmol/L (ref 98–111)
Creatinine, Ser: 1.52 mg/dL — ABNORMAL HIGH (ref 0.61–1.24)
GFR, Estimated: 50 mL/min — ABNORMAL LOW (ref >60)
Glucose, Bld: 142 mg/dL — ABNORMAL HIGH (ref 70–99)
Potassium: 3.6 mmol/L (ref 3.5–5.1)
Sodium: 138 mmol/L (ref 135–145)

## 2020-05-29 MED ORDER — INSULIN ASPART 100 UNIT/ML IJ SOLN
4.0000 [IU] | Freq: Three times a day (TID) | INTRAMUSCULAR | Status: DC
  Administered 2020-05-29 – 2020-05-30 (×3): 4 [IU] via SUBCUTANEOUS

## 2020-05-29 MED ORDER — INSULIN ASPART 100 UNIT/ML IJ SOLN
0.0000 [IU] | Freq: Three times a day (TID) | INTRAMUSCULAR | Status: DC
  Administered 2020-05-29: 8 [IU] via SUBCUTANEOUS
  Administered 2020-05-30: 3 [IU] via SUBCUTANEOUS
  Administered 2020-05-30: 5 [IU] via SUBCUTANEOUS
  Administered 2020-05-30: 15 [IU] via SUBCUTANEOUS
  Administered 2020-05-31: 5 [IU] via SUBCUTANEOUS
  Administered 2020-05-31: 8 [IU] via SUBCUTANEOUS
  Administered 2020-05-31: 2 [IU] via SUBCUTANEOUS
  Administered 2020-06-01 (×2): 5 [IU] via SUBCUTANEOUS
  Administered 2020-06-01 – 2020-06-02 (×2): 3 [IU] via SUBCUTANEOUS
  Administered 2020-06-02: 5 [IU] via SUBCUTANEOUS

## 2020-05-29 MED ORDER — INSULIN DETEMIR 100 UNIT/ML ~~LOC~~ SOLN
38.0000 [IU] | Freq: Two times a day (BID) | SUBCUTANEOUS | Status: DC
  Administered 2020-05-29 – 2020-06-02 (×8): 38 [IU] via SUBCUTANEOUS
  Filled 2020-05-29 (×9): qty 0.38

## 2020-05-29 MED ORDER — CARVEDILOL 3.125 MG PO TABS
3.1250 mg | ORAL_TABLET | Freq: Two times a day (BID) | ORAL | Status: DC
  Administered 2020-05-29 – 2020-06-02 (×9): 3.125 mg via ORAL
  Filled 2020-05-29 (×9): qty 1

## 2020-05-29 MED FILL — Ketamine HCl Soln Pref Syr 50 MG/5ML (10 MG/ML): INTRAMUSCULAR | Qty: 5 | Status: AC

## 2020-05-29 NOTE — TOC Initial Note (Signed)
Transition of Care (TOC) - Initial/Assessment Note  Heart Failure   Patient Details  Name: [name] MRN: [id number] Date of Birth: 1955/06/08  Transition of Care [facility]) CM/SW Contact:    [place], [place] Phone Number: 05/29/2020, 2:41 PM  Clinical Narrative:         CSW spoke with the patient at bedside completed very brief SDOH screening with the patient who denied having any needs at this time. Patient reported they do have a PCP and they can get to the pharmacy to pick up their medications. CSW provided the patient with social workers name and position and an appointment card for the [facility] outpatient clinic and encouraged them to follow up and to attend the appointment and bring their medications and if anything changes to please reach out so that CSW/HV clinic team can provide support.    TOC will continue to follow for discharge needs.   Expected Discharge Plan: IP Rehab Facility Barriers to Discharge: Continued Medical Work up   Patient Goals and CMS Choice        Expected Discharge Plan and Services Expected Discharge Plan: [place] In-house Referral: Clinical Social Work     Living arrangements for the past 2 months: Single Family Home                                      Prior Living Arrangements/Services Living arrangements for the past 2 months: Single Family Home   Patient language and need for interpreter reviewed:: Yes        Need for Family Participation in Patient Care: No (Comment) Care giver support system in place?: No (comment)   Criminal Activity/Legal Involvement Pertinent to Current Situation/Hospitalization: No - Comment as needed  Activities of Daily Living Home Assistive Devices/Equipment: None ADL Screening (condition at time of admission) Patient's cognitive ability adequate to safely complete daily activities?: No Is the patient deaf or have difficulty hearing?: No Does the patient have difficulty seeing, even when  wearing glasses/contacts?: No Does the patient have difficulty concentrating, remembering, or making decisions?: No Patient able to express need for assistance with ADLs?: Yes Does the patient have difficulty dressing or bathing?: No Independently performs ADLs?: Yes (appropriate for developmental age) Does the patient have difficulty walking or climbing stairs?: No Weakness of Legs: None Weakness of Arms/Hands: None  Permission Sought/Granted                  Emotional Assessment Appearance:: Appears stated age Attitude/Demeanor/Rapport: Lethargic Affect (typically observed): Quiet Orientation: : Oriented to Self,Oriented to Place,Oriented to  Time,Oriented to Situation   Psych Involvement: No (comment)  Admission diagnosis:  Cardiac arrest [facility]) [I46.9] Patient Active Problem List   Diagnosis Date Noted  . Congestive heart failure ([place])   . Cardiac arrest ([place]) 05/19/2020  . Bilateral inguinal hernia 11/08/2012  . Osteoarthritis of left hip 09/13/2012    Class: Chronic  . Periprosthetic fracture around internal prosthetic left hip joint ([place]) 09/13/2012    Class: Acute  . Postoperative anemia due to acute blood loss 09/12/2012    Class: Acute  . Exertional dyspnea 06/12/2012  . Obesity 02/28/2012  . Chronic back pain 02/28/2012  . Osteoarthritis 10/15/2007  . GASTROENTERITIS 08/28/2007  . GERD 07/13/2007  . CELLULITIS, RIGHT LEG 06/26/2007  . SINUSITIS 09/12/2006  . SEBACEOUS CYST, INFECTED 08/24/2006  . Type 2 diabetes mellitus without complications ([place]) 54/62/7035  .  Dyslipidemia 08/02/2006  . Essential hypertension 08/02/2006  . History of colonic polyps 08/02/2006   PCP:  Clinic, [place]:   [place], [address], [address] [place], [place] [address] Phone: [phone] Fax: [phone]  [id number] [place], [place] [address] [place] [address] Phone: [phone] Fax: [phone]  [place], [place] [place] [address] Phone: [phone] Fax: [phone]     Social Determinants of Health (SDOH) Interventions Food Insecurity Interventions: Intervention Not Indicated Financial Strain Interventions: Intervention Not Indicated Housing Interventions: Intervention Not Indicated Transportation Interventions: Intervention Not Indicated  Readmission Risk Interventions No flowsheet data found.  Rahima Fleishman, MSW, [place] Heart Failure Social Worker

## 2020-05-29 NOTE — Progress Notes (Signed)
Nutrition Follow-up  DOCUMENTATION CODES:   Not applicable  INTERVENTION:   -Continue double protein portions with meals -Continue MVI with minerals daily -Provided "Heart Healthy, Consistent Carbohydrate Nutrition Therapy" handout from [facility] Nutrition Care Manual' attached to AVS/ discharge summary -RD referred pt to [place]'s Nutrition and Diabetes Education Services for further nutritional support as an outpatient  NUTRITION DIAGNOSIS:   Increased nutrient needs related to acute illness as evidenced by estimated needs.  Ongoing  GOAL:   Patient will meet greater than or equal to 90% of their needs  Progressing   MONITOR:   Diet advancement,PO intake,Supplement acceptance,Labs,Weight trends,Skin,I & O's  REASON FOR ASSESSMENT:   Ventilator    ASSESSMENT:   Patient with PMH significant for DM, GERD, colonic polyps, s/p total hip replacement, HLD, and HTN. Presents this admission with VF cardiac arrest.  5/26- s/p ICD placement  Reviewed I/O's: -2.1 L x 24 hours and -16.2 L since admission  Pt unavailable at time of visit.   Pt with good appetite. Noted meal completion 30-100%.  Per MD notes, request wt loss education. Obesity is a complex, chronic medical condition that is optimally managed by a multidisciplinary care team. Weight loss is not an ideal goal for an acute inpatient hospitalization. However, if further work-up for obesity is warranted, consider outpatient referral to outpatient bariatric service and/or [place]'s Nutrition and Diabetes Education Services.   Medications reviewed and include colace and lasix.  Per TOC notes, plan for CIR vs SNF at discharge.   Labs reviewed: CBGS: 139-245 (inpatient orders for glycemic control are 0-15 units insulin aspart TID with meals, 4 units insulin aspart TID with meals, and 38 units insulin detemir BID).   Diet Order:   Diet Order            Diet Heart Room service appropriate? Yes; Fluid consistency:  Thin  Diet effective now                 EDUCATION NEEDS:   Not appropriate for education at this time  Skin:  Skin Assessment: Reviewed RN Assessment  Last BM:  05/28/20  Height:   Ht Readings from Last 1 Encounters:  05/20/20 5' 10.98" (1.803 m)    Weight:   Wt Readings from Last 1 Encounters:  05/29/20 126.4 kg   BMI:  Body mass index is 38.88 kg/m.  Estimated Nutritional Needs:   Kcal:  2050-2250  Protein:  110-125 grams  Fluid:  >2 L    Loistine Chance, RD, LDN, [place] Registered Dietitian II Certified Diabetes Care and Education Specialist Please refer to [facility] for RD and/or RD on-call/weekend/after hours pager

## 2020-05-29 NOTE — Progress Notes (Signed)
Inpatient Diabetes Program Recommendations  AACE/ADA: New Consensus Statement on Inpatient Glycemic Control (2015)  Target Ranges:  Prepandial:   less than 140 mg/dL      Peak postprandial:   less than 180 mg/dL (1-2 hours)      Critically ill patients:  140 - 180 mg/dL   Lab Results  Component Value Date   GLUCAP 245 (H) 05/29/2020   HGBA1C 8.2 05/25/2016    Review of Glycemic Control Results for OLEG, OLESON (MRN [id number]) as of 05/29/2020 13:37  Ref. Range 05/28/2020 23:49 05/29/2020 04:23 05/29/2020 05:17 05/29/2020 07:30 05/29/2020 11:42  Glucose-Capillary Latest Ref Range: 70 - 99 mg/dL 215 (H) 142 (H) 152 (H) 139 (H) 245 (H)   Diabetes history: DM 2 Outpatient Diabetes medications:  Novolog 40 unit with breakfast and lunch and Novolog 50 units with supper Levemir 80 units q HS Current orders for Inpatient glycemic control:  Novolog resistant q 4 hours Levemir 34 units bid Inpatient Diabetes Program Recommendations:   Please change Novolog correction to tid with meals and HS scale.  Also please add Novolog meal coverage 6 units tid with meals.   Thanks,  Adah Perl, RN, BC-ADM Inpatient Diabetes Coordinator Pager [phone] (8a-5p)

## 2020-05-29 NOTE — Progress Notes (Addendum)
Progress Note  Patient Name: [name] Date of Encounter: 05/29/2020  [facility] HeartCare Cardiologist: Sinclair Grooms, MD   Subjective   "I am weak and tired", no CP outside of chest wall tenderness that is slowly improving, cough is better, did not sleep well last night  Inpatient Medications    Scheduled Meds: . aspirin  81 mg Oral Daily  . atorvastatin  80 mg Oral Daily  . chlorhexidine gluconate (MEDLINE KIT)  15 mL Mouth Rinse BID  . Chlorhexidine Gluconate Cloth  6 each Topical Daily  . cyclobenzaprine  5 mg Oral TID  . docusate sodium  100 mg Oral BID  . furosemide  40 mg Oral Daily  . gabapentin  200 mg Oral BID  . insulin aspart  0-20 Units Subcutaneous Q4H  . insulin detemir  34 Units Subcutaneous BID  . mouth rinse  15 mL Mouth Rinse BID  . multivitamin with minerals  1 tablet Oral Daily  . mupirocin ointment  1 application Nasal BID  . pantoprazole  40 mg Oral BID AC  . potassium chloride  20 mEq Oral Daily  . sodium chloride flush  3 mL Intravenous Q12H  . sodium chloride flush  3 mL Intravenous Q12H  . sodium chloride flush  3 mL Intravenous Q12H   Continuous Infusions: . sodium chloride Stopped (05/25/20 1831)  . sodium chloride Stopped (05/28/20 1200)   PRN Meds: sodium chloride, acetaminophen, diclofenac Sodium, ketotifen, lip balm, nitroGLYCERIN, ondansetron (ZOFRAN) IV, oxyCODONE, polyvinyl alcohol, sodium chloride flush, sodium chloride flush   Vital Signs    Vitals:   05/28/20 1925 05/28/20 2331 05/29/20 0348 05/29/20 0733  BP: 119/61 119/67 140/69 117/72  Pulse: 93 86 90 88  Resp: _0 Temp: 98.1 F (36.7 C) 98.5 F (36.9 C) 98.3 F (36.8 C) 98.5 F (36.9 C)  TempSrc: Oral Oral Oral Oral  SpO2: 92% 94% 94% 92%  Weight:   126.4 kg   Height:        Intake/Output Summary (Last 24 hours) at 05/29/2020 2423 Last data filed at 05/29/2020 0300 Gross per 24 hour  Intake 270.66 ml  Output 2410 ml  Net -2139.34 ml   Last 3  Weights 05/29/2020 05/28/2020 05/28/2020  Weight (lbs) 278 lb 10.6 oz 291 lb 14.2 oz 291 lb 14.2 oz  Weight (kg) 126.4 kg 132.4 kg 132.4 kg      Telemetry    SR, no bradycardia or heart block, infrequent PVCs - Personally Reviewed  ECG    SR, RBBB - Personally Reviewed  Physical Exam   Examined by Dr. Quentin Ore GEN: No acute distress.   Neck: No JVD, obese neck,  Cardiac: RRR, no murmurs, rubs, or gallops.  Respiratory: CTA b/l   GI: Soft, nontender, non-distended, obese MS: No edema; No deformity. Neuro:  Nonfocal  Psych: Normal affect   L chest: ICD site: is stable, dressing is clean and dry, no hematoma  Labs    High Sensitivity Troponin:   Recent Labs  Lab 05/20/20 0527 05/20/20 0842 05/20/20 1121 05/20/20 1240 05/20/20 1801  TROPONINIHS 10,906* 9,166* 8,258* 7,858* 5,624*      Chemistry Recent Labs  Lab 05/27/20 0102 05/28/20 0320 05/29/20 0417  NA 141 139 138  K 3.7 3.8 3.6  CL 104 100 102  CO2 _1 GLUCOSE 148* 160* 142*  BUN 54* 43* 39*  CREATININE 1.80* 1.60* 1.52*  CALCIUM 8.5* 8.7* 8.8*  GFRNONAA 41* 47* 50*  ANIONGAP _0 Hematology Recent Labs  Lab 05/26/20 0346 05/28/20 0320 05/29/20 0417  WBC 7.6 10.7* 11.9*  RBC 4.15* 4.53 5.00  HGB 12.0* 13.1 14.5  HCT 37.9* 41.5 44.5  MCV 91.3 91.6 89.0  MCH 28.9 28.9 29.0  MCHC 31.7 31.6 32.6  RDW 14.8 14.6 14.6  PLT 232 407* 465*    BNPNo results for input(s): BNP, PROBNP in the last 168 hours.   DDimer No results for input(s): DDIMER in the last 168 hours.   Radiology    DG CHEST PORT 1 VIEW Result Date: 05/24/2020 CLINICAL DATA:  Shortness of breath and hypotension. EXAM: PORTABLE CHEST 1 VIEW COMPARISON:  05/23/2020 FINDINGS: Cardiomegaly, pulmonary vascular congestion and mild interstitial again noted. An endotracheal tube with tip 6.2 cm above the carina, NG/OG tube entering the stomach, and RIGHT subclavian central venous catheter with tip overlying the LOWER SVC again  noted. There is no evidence of pneumothorax. Mild bibasilar atelectasis is again noted. Little interval changes noted from the prior study. IMPRESSION: Unchanged appearance of the chest with cardiomegaly, pulmonary vascular congestion, probable mild interstitial edema and bibasilar atelectasis. Electronically Signed   By: Margarette Canada M.D.   On: 05/24/2020 12:04    Cardiac Studies    05/28/20: TTE IMPRESSIONS  1. Limited echo shows normal LVEF with no WMA. Normal RV function/size.  2. Left ventricular ejection fraction, by estimation, is 60 to 65%. The  left ventricle has normal function. The left ventricle has no regional  wall motion abnormalities.  3. Right ventricular systolic function is normal. The right ventricular  size is normal.   Result Date: 05/19/2020  100% occlusion of the mid RCA, likely representing a chronic total occlusion given absence of thrombus staining in the area of occlusion and well-formed left-to-right collaterals.  Widely patent left main  Widely patent LAD  Widely patent circumflex  LVEDP 14 initially and following angiography and IV fluid 20 mmHg  RECOMMENDATIONS:  IV dobutamine to improve potential RV dysfunction.  IV fluids as tolerated  A central line will be placed in the unit to measure co-oximetry and get right heart filling pressures.  Continue Levophed, epinephrine, IV fentanyl, and intermittent Versed as needed. Wean Levophed and epinephrine as tolerated.  Advanced heart failure team will help manage.  Critical care team, Dr. Doyne Keel managing primarily.   05/20/2020: TTE IMPRESSIONS  1. Left ventricular ejection fraction, by estimation, is 40 to 45%. The  left ventricle has mildly decreased function. The left ventricle  demonstrates regional wall motion abnormalities (basal and mid  inferolateral suggestive of RCA disease). There is  mild concentric left ventricular hypertrophy. Left ventricular diastolic  parameters are indeterminate.   2. Right ventricular systolic function is hyperdynamic. The right  ventricular size is normal.  3. A small pericardial effusion is present.  4. The mitral valve is grossly normal. No evidence of mitral valve  regurgitation.  5. The aortic valve was not well visualized. Aortic valve regurgitation  is not visualized.  6. Aortic dilatation noted. There is borderline dilatation of the aortic  root, measuring 38 mm.  7. The inferior vena cava is dilated in size with <50% respiratory  variability, suggesting right atrial pressure of 15 mmHg.   Patient Profile     65 y.o. male with a hx of IDDM, HLD, HTN, HLD, who was admitted to [facility] 05/19/20 after a witnessed cardiac arrest    suffered witnessed out of hospital cardiac arrest 5/17 w/ immediate  bystander CPR, initiated by wife, and prompt activation of EMS.  Wife reported EMS shocking him apparently multiple times Per report, had prolonged CPR, more than 45 min prior to ROSC. Underlying rhythm was VFib. Post arrest EKG suggested possibility of inferior MI.Portable bedside echoshowedinferobasal severe hypokinesis with septal bounce. RV systolic function normal.LVEF 40 to 50% range.  HS Trop 2566 > 9947 > 10906 > 9166 > 8258 > 7858 > 5624 K+ 4.3 > 3.0 > 2.7 > 2.7 > 2.6 > 2.5 > 2.9 >>>> 3.5 > 3.7 > 3.4  Mag 2.8 > 1.2 > 2.2 >>>> 2.1 Creat 1.29 >> 3.06 >>>> 2.07  Emergent cath showed100% occlusion of the mid RCA, likely representing a chronic total occlusion given absence of thrombus staining in the area of occlusion and well-formed left-to-right collaterals. No other disease. LM, LAD and LCx all widely patent. No indication for PCI. He had cardiogenic shock, HF team was brought on board requiring multiple pressors, electrolytes supported/replaced, was also on hydrocortisone Was intubated but did not require mechanical cardiac support Started and maintained on amiodarone gtt Started on ceftriaxone/Flagyl for aspiration  coverage EchowithLV EF in the 45% range with mildly dilated RV but function appearedrelatively normal. Abn LFTs and AKI felt 2/2 prolonged arrest Lasix gtt with excellent UOP Over the weekend started weaning off his pressors and he was extubated 5/22\ Weaned off pressors 5/22-23, developed CHB with prolonged V pause of 7 seconds and dobutamine resumed amiodarone gtt stopped EP consulted for ICD/pacing needs   Assessment & Plan    1. VF arrest (no EKGs/tracings of this)     Reportedly had prolonged CPR and multiple shocks by EMS 2. CAD 3. MI/ACS     100% RCA, felt to be CTO, not intervened      Cath as above     On ASA, statin, off hep gtt 4. Shock     Off pressors now a couple days  Now s/p dual chamber ICD implant yesterday device check this AM with intact function CXR this AM with stable lead position, no ptx Site is stable, no bleeding or hematoma Wound care and activity restrictions are discussed with the patient Routine post implant EP follow up is in place  Pt was made aware, no driving 56mo PLEASE 1. Remove tegaderm dressing prior to discharge, or notify EP team to remove once discharge known 2. Given No intervention on CAD, please do not resume Plavix for 3 days 3. OK to mobilize please follow LUE restrictions Activity No heavy lifting or vigorous activity with the left arm for 6 to 8 weeks.  Do not raise your left/right arm above your head for one week.  Gradually raise your affected arm as drawn below             06/04/20                       06/05/20                     06/06/20                     06/07/20    5. ICM     LVEF 40-45%     Diuresing well     Limited echo yesterday with recovered LVEF     HF team managing       6. CHB     Baseline conduction system disease w/ IVCD <130 msec, and  LAD     Regained 1:1 on dobutamine     He had transient complete heart block and subsequent mobitz I AV block.     Off dobutamine a couple days, no recurrent heart  block     Baseline conduction system disease for years     No recurrent heart block     Now s/p ICD (dual chamber) can start betablocker, deferred to attending/HF team   7. Respiratory failure, ARDS     Likely aspiration, pneumonitis - improving     Extubated 5/22     completed antibiotics     Cough is better   8. Pt is pleasant and follows commands appropriately, some degree of post arrest enephalopathy     Some residual remains, he is AAO x4, though intermittently tends to get tangential 9. functional decline post arrest and prolonged bed rest     PT is working with him, rec CIR          For questions or updates, please contact [place] Please consult [email or web address] for contact info under        Signed, Baldwin [place], PA-C  05/29/2020, 8:12 AM

## 2020-05-29 NOTE — Social Work (Signed)
Pt is possible CIR candidate, if denied backup is SNF. CSW following.

## 2020-05-29 NOTE — Progress Notes (Signed)
PROGRESS NOTE   [name]  KPT:[id number] DOB: Apr 17, 1955 DOA: 05/19/2020 PCP: [facility]  Brief Narrative:  65 year old white male community dwelling Inferior wall STEMI VF arrest 05/19/2020 with 45-50 minutes CPR subsequent hypotension ST elevation inferior leads Cardiac cath = mid RCA total occlusion Rx Heparin DAPT Rx vasopressors for distributive shock  5/17 Admitted to [facility] as transfer from [place]. S/p VF arrest with 45 mins CPR before ROSC. Left femoral line inserted. C/f inferior STEMI. 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 LA and troponins downtrending. Weaning Giapreza. SVR and CO remain suboptimal  5/19 Weaning DBA, Giapreza. Starting lasix gtt. Able to follow commands. Had an episode of VF, self-aborted. Was given additional amiodarone. 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 TOC to look into ensuring he has machine DM TY 2 Continue current Levemir 34 twice daily (home meds 80 at bedtime) and resistant scale Insulin dosing adjusted Hold Jardiance at d/c Resume slowly gabapentin at lower dose 200 4 times daily HTN Holding at this time Prinzide 1 daily ATN on admission Secondary to shock physiology which is now resolved Creatinine improving BMI 40 Enlist help of nutritionist-patient  interested in weight loss exercise and diet counseling-admits to indiscretions with diet   DVT prophylaxis: SCDs currently Code Status: Full Family Communication: Discussed 5/27 with wife Mechele Claude at bedside-her number [phone] Disposition:  Status is: Inpatient  Remains inpatient appropriate because:Hemodynamically unstable, IV treatments appropriate due to intensity of illness or inability to take PO and Inpatient level of care appropriate due to severity of illness   Dispo: The patient is from: Home              Anticipated d/c is to: CIR              Patient currently is not medically stable to d/c.   Difficult to place patient No       Consultants:   Cardiology  EP  Procedures: Multiple  Antimicrobials: Perioperative only   Subjective: Sitting up in chair Still tired--seems grateful/emotional at times. Listened attentively.   Objective: Vitals:   05/28/20 2331 05/29/20 0348 05/29/20 0733 05/29/20 1145  BP: 119/67 140/69 117/72 115/73  Pulse: 86 90 88 87  Resp: $Remo'18 18 19 'VobMS$ (!) 22  Temp: 98.5 F (36.9 C) 98.3 F (36.8 C) 98.5 F (36.9 C) 98.8 F (37.1 C)  TempSrc: Oral Oral Oral Oral  SpO2: 94% 94% 92% 92%  Weight:  126.4 kg    Height:        Intake/Output Summary (Last 24 hours) at 05/29/2020 1407 Last data filed at 05/29/2020 1346 Gross per 24 hour  Intake 750.66 ml  Output 1780 ml  Net -1029.34 ml   Filed Weights   05/28/20 0043 05/28/20 0500 05/29/20 0348  Weight: 132.4 kg 132.4 kg 126.4 kg    Examination:  Thick neck Cannot appreciate JVD Slight crackles posterolaterally Somewhat swollen lower extremities Abdomen obese cannot  obtain clear exam of liver or spleen   Data Reviewed: personally reviewed   CBC    Component Value Date/Time   WBC 11.9 (H) 05/29/2020 0417   RBC 5.00 05/29/2020 0417   HGB 14.5 05/29/2020 0417   HCT 44.5 05/29/2020 0417   PLT 465 (H) 05/29/2020 0417   MCV 89.0 05/29/2020 0417   MCH 29.0 05/29/2020 0417    MCHC 32.6 05/29/2020 0417   RDW 14.6 05/29/2020 0417   LYMPHSABS 4.1 (H) 05/19/2020 1818   MONOABS 0.7 05/19/2020 1818   EOSABS 0.1 05/19/2020 1818   BASOSABS 0.1 05/19/2020 1818   CMP Latest Ref Rng & Units 05/29/2020 05/28/2020 05/27/2020  Glucose 70 - 99 mg/dL 142(H) 160(H) 148(H)  BUN 8 - 23 mg/dL 39(H) 43(H) 54(H)  Creatinine 0.61 - 1.24 mg/dL 1.52(H) 1.60(H) 1.80(H)  Sodium 135 - 145 mmol/L 138 139 141  Potassium 3.5 - 5.1 mmol/L 3.6 3.8 3.7  Chloride 98 - 111 mmol/L 102 100 104  CO2 22 - 32 mmol/L $RemoveB'28 27 25  'LwzHqssL$ Calcium 8.9 - 10.3 mg/dL 8.8(L) 8.7(L) 8.5(L)  Total Protein 6.5 - 8.1 g/dL - - -  Total Bilirubin 0.3 - 1.2 mg/dL - - -  Alkaline Phos 38 - 126 U/L - - -  AST 15 - 41 U/L - - -  ALT 0 - 44 U/L - - -     Radiology Studies: DG Chest 2 View  Result Date: 05/29/2020 CLINICAL DATA:  ICD EXAM: CHEST - 2 VIEW COMPARISON:  05/24/2020 FINDINGS: Interval removal of endotracheal and nasogastric tubes. New LEFT subclavian ICD with leads projecting at RIGHT atrium and RIGHT ventricle. Normal heart size and mediastinal contours. Improved pulmonary edema. No pleural effusion or pneumothorax. Advanced degenerative changes LEFT glenohumeral joint. IMPRESSION: No pneumothorax following ICD placement. Improved pulmonary edema. Electronically Signed   By: Lavonia Dana M.D.   On: 05/29/2020 08:11   ECHOCARDIOGRAM LIMITED  Result Date: 05/28/2020    ECHOCARDIOGRAM LIMITED REPORT   Patient Name:   [name] Date of Exam: 05/28/2020 Medical Rec #:  [id number]       Height:       71.0 in Accession #:    [id number]      Weight:       291.9 lb Date of Birth:  04-Oct-1955       BSA:          2.476 m Patient Age:    65 years        BP:           147/61 mmHg Patient Gender: M               HR:           80 bpm. Exam Location:  Inpatient Procedure: 2D Echo, Cardiac Doppler and Color Doppler Indications:    Cardiomyopathy  History:        Patient has prior history of Echocardiogram examinations, most                  recent 05/20/2020. Risk Factors:Hypertension and Diabetes.  Sonographer:    Cammy Brochure Referring Phys: [id number] Vickie Epley  Sonographer Comments: Suboptimal apical window. Image acquisition challenging due to patient body habitus. IMPRESSIONS  1. Limited echo shows normal LVEF with no WMA. Normal RV function/size.  2. Left ventricular ejection fraction, by estimation, is 60 to 65%. The left ventricle has normal function. The left ventricle has no regional wall motion abnormalities.  3.  Right ventricular systolic function is normal. The right ventricular size is normal. FINDINGS  Left Ventricle: Left ventricular ejection fraction, by estimation, is 60 to 65%. The left ventricle has normal function. The left ventricle has no regional wall motion abnormalities. Definity contrast agent was given IV to delineate the left ventricular  endocardial borders. Right Ventricle: The right ventricular size is normal. No increase in right ventricular wall thickness. Right ventricular systolic function is normal. Left Atrium: Left atrial size was normal in size. Right Atrium: Right atrial size was normal in size. Pericardium: Trivial pericardial effusion is present. Presence of pericardial fat pad. Venous: The inferior vena cava was not well visualized. IAS/Shunts: The atrial septum is grossly normal. Eleonore Chiquito MD Electronically signed by Eleonore Chiquito MD Signature Date/Time: 05/28/2020/8:37:21 AM    Final      Scheduled Meds: . aspirin  81 mg Oral Daily  . atorvastatin  80 mg Oral Daily  . carvedilol  3.125 mg Oral BID WC  . chlorhexidine gluconate (MEDLINE KIT)  15 mL Mouth Rinse BID  . Chlorhexidine Gluconate Cloth  6 each Topical Daily  . cyclobenzaprine  5 mg Oral TID  . docusate sodium  100 mg Oral BID  . furosemide  40 mg Oral Daily  . gabapentin  200 mg Oral BID  . insulin aspart  0-20 Units Subcutaneous Q4H  . insulin detemir  34 Units Subcutaneous BID  . mouth rinse  15 mL Mouth  Rinse BID  . multivitamin with minerals  1 tablet Oral Daily  . mupirocin ointment  1 application Nasal BID  . pantoprazole  40 mg Oral BID AC  . potassium chloride  20 mEq Oral Daily  . sodium chloride flush  3 mL Intravenous Q12H  . sodium chloride flush  3 mL Intravenous Q12H  . sodium chloride flush  3 mL Intravenous Q12H   Continuous Infusions: . sodium chloride Stopped (05/25/20 1831)  . sodium chloride Stopped (05/28/20 1200)     LOS: 10 days   Time spent: [place], MD [facility] To contact the attending provider between 7A-7P or the covering provider during after hours 7P-7A, please log into the web site [email or web address] and access using universal [place] password for that web site. If you do not have the password, please call the hospital operator.  05/29/2020, 2:07 PM

## 2020-05-29 NOTE — Progress Notes (Signed)
Physical Therapy Treatment Patient Details Name: [name] MRN: [id number] DOB: [birth year] Today's Date: 05/29/2020    History of Present Illness 65 yo admitted 5/17 with OOH VF arrest with >45 min CPR. After arrival at [facility] pt with hypotension evening of 5/17 and emesis with STEMI. Intubated 5/17-5/22. 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 PMhx: HTN, HLD, DM, GERD, chronic pain, RBBB, Left THA    PT Comments    Patient reports feeling very tired today due to being up early this morning and not having slept much. Finally agreeable to getting up to chair. Requires heavy Mod A to elevate trunk to get to EOB due to not being able to pull with LUE secondary to pacemaker placement yesterday. Requires min A to stand from elevated surface with wide BoS and Min A to take a few steps to get to chair. No dizziness reported. Education on pacemaker precautions as pt is terrified of moving LUE per report. Will be ready to progress gait next session as pt willing/able. Will follow acutely.    Follow Up Recommendations  CIR;Supervision/Assistance - 24 hour     Equipment Recommendations  None recommended by PT    Recommendations for Other Services       Precautions / Restrictions Precautions Precautions: Fall;ICD/Pacemaker Required Braces or Orthoses: Sling Restrictions Weight Bearing Restrictions: Yes Other Position/Activity Restrictions: pacemaker precautions    Mobility  Bed Mobility Overal bed mobility: Needs Assistance Bed Mobility: Supine to Sit     Supine to sit: HOB elevated;Mod assist     General bed mobility comments: Heavy assist with trunk to get to EOB, able to move BLEs without assist. Not using LUE due to new pacemaker.    Transfers Overall transfer level: Needs assistance Equipment used: 1 person hand held assist Transfers: Sit to/from Stand Sit to Stand: Min assist;From elevated surface         General transfer comment: assist to power to standing from elevated surface, cues for  upright posture. Transferred to chair.  Ambulation/Gait Ambulation/Gait assistance: Min assist Gait Distance (Feet): 3 Feet Assistive device: 1 person hand held assist;None Gait Pattern/deviations: Wide base of support     General Gait Details: Able to take a few steps to get to chair with Min A for balance, with wide BoS. unsteady.   Stairs             Wheelchair Mobility    Modified Rankin (Stroke Patients Only)       Balance Overall balance assessment: Needs assistance Sitting-balance support: Feet unsupported;No upper extremity supported Sitting balance-Leahy Scale: Fair Sitting balance - Comments: supervision for safety.   Standing balance support: During functional activity Standing balance-Leahy Scale: Poor Standing balance comment: Requires HHA (Min A) for support in static standing with wide BoS.                            Cognition Arousal/Alertness: Awake/alert Behavior During Therapy: WFL for tasks assessed/performed Overall Cognitive Status: Within Functional Limits for tasks assessed                                 General Comments: reports feeling sleepy and tired today. "the doctors told me I could take it easy today."      Exercises      General Comments General comments (skin integrity, edema, etc.): Wife present during session. Pt declined further mobility due to feeling tired  and not having slept much. Pt terrified of moving LUE after pacer placement.      Pertinent Vitals/Pain Pain Assessment: Faces Faces Pain Scale: Hurts little more Pain Location: LUE, chest Pain Descriptors / Indicators: Sore;Aching;Guarding Pain Intervention(s): Monitored during session;Repositioned;Limited activity within patient's tolerance    Home Living                      Prior Function            PT Goals (current goals can now be found in the care plan section) Progress towards PT goals: Not progressing toward goals  - comment (due to sleepiness, LUE pain)    Frequency    Min 3X/week      PT Plan Current plan remains appropriate    Co-evaluation              AM-PAC PT "6 Clicks" Mobility   Outcome Measure  Help needed turning from your back to your side while in a flat bed without using bedrails?: A Lot Help needed moving from lying on your back to sitting on the side of a flat bed without using bedrails?: A Lot Help needed moving to and from a bed to a chair (including a wheelchair)?: A Little Help needed standing up from a chair using your arms (e.g., wheelchair or bedside chair)?: A Little Help needed to walk in hospital room?: A Lot Help needed climbing 3-5 steps with a railing? : Total 6 Click Score: 13    End of Session   Activity Tolerance: Patient limited by lethargy;Patient limited by pain;Patient limited by fatigue Patient left: in chair;with call bell/phone within reach;with family/visitor present Nurse Communication: Mobility status;Other (comment) (left sign on the door to left pt sleep, RN aware) PT Visit Diagnosis: Other abnormalities of gait and mobility (R26.89);Difficulty in walking, not elsewhere classified (R26.2);Muscle weakness (generalized) (M62.81);Pain Pain - Right/Left: Left Pain - part of body: Arm (chest)     Time: 1013-1040 PT Time Calculation (min) (ACUTE ONLY): 27 min  Charges:  $Therapeutic Activity: 23-37 mins                     Marisa Severin, PT, DPT Acute Rehabilitation Services Pager [phone] Office [phone]       [name] 05/29/2020, 12:29 PM

## 2020-05-29 NOTE — Progress Notes (Signed)
Inpatient Rehab Admissions Coordinator:   Awaiting determination from [facility] for prior authorization request for CIR. Will continue to follow.   Shann Medal, PT, DPT Admissions Coordinator [phone] 05/29/20  10:19 AM

## 2020-05-29 NOTE — Progress Notes (Addendum)
Patient ID: [name], male   DOB: [birth year], 65 y.o.   MRN: [id number]     Advanced Heart Failure Rounding Note  PCP-Cardiologist: Sinclair Grooms, MD   Subjective:   - Extubated 5/22 - CHB episode 5/23, amiodarone stopped.  - 5/24 dobutamine stopped.  - 5/26 Repeat limited echo with EF back up to 60-65%, normal RV.  - 5/26 dual chamber ICD implanted   Got ICD yesterday. CXR this morning ok w/ no PTX. Improved pulmonary edema. Device function normal and pocket stable.   Good UOP w/ PO diuretics, -2.4L out yesterday.  SCr continues to trend down, 1.80>>1.60>>1.52. K 3.6   BP stable and well controlled. Feels tired this morning.   Objective:   Weight Range: 126.4 kg Body mass index is 38.88 kg/m.   Vital Signs:   Temp:  [98 F (36.7 C)-98.5 F (36.9 C)] 98.5 F (36.9 C) (05/27 0733) Pulse Rate:  [80-93] 88 (05/27 0733) Resp:  [16-19] 19 (05/27 0733) BP: (107-140)/(61-89) 117/72 (05/27 0733) SpO2:  [92 %-99 %] 92 % (05/27 0733) Weight:  [126.4 kg] 126.4 kg (05/27 0348) Last BM Date: 05/28/20  Weight change: Filed Weights   05/28/20 0043 05/28/20 0500 05/29/20 0348  Weight: 132.4 kg 132.4 kg 126.4 kg    Intake/Output:   Intake/Output Summary (Last 24 hours) at 05/29/2020 0835 Last data filed at 05/29/2020 0300 Gross per 24 hour  Intake 270.66 ml  Output 2410 ml  Net -2139.34 ml      Physical Exam   PHYSICAL EXAM: General:  fatigued appearing, obese WM. No respiratory difficulty HEENT: normal Neck: supple. Thick neck, no JVD. Carotids 2+ bilat; no bruits. No lymphadenopathy or thyromegaly appreciated. Cor: PMI nondisplaced. Regular rate & rhythm. No rubs, gallops or murmurs. Device pocket bandaged no visible drainage  Lungs: clear Abdomen: obese, soft, nontender, nondistended. No hepatosplenomegaly. No bruits or masses. Good bowel sounds. Extremities: no cyanosis, clubbing, rash, edema Neuro: alert & oriented x 3, cranial nerves grossly intact.  moves all 4 extremities w/o difficulty. Affect pleasant.    Telemetry  NSR 90s  (personally reviewed)  Labs    CBC Recent Labs    05/28/20 0320 05/29/20 0417  WBC 10.7* 11.9*  HGB 13.1 14.5  HCT 41.5 44.5  MCV 91.6 89.0  PLT 407* 578*   Basic Metabolic Panel Recent Labs    05/28/20 0320 05/29/20 0417  NA 139 138  K 3.8 3.6  CL 100 102  CO2 27 28  GLUCOSE 160* 142*  BUN 43* 39*  CREATININE 1.60* 1.52*  CALCIUM 8.7* 8.8*   Liver Function Tests No results for input(s): AST, ALT, ALKPHOS, BILITOT, PROT, ALBUMIN in the last 72 hours. No results for input(s): LIPASE, AMYLASE in the last 72 hours. Cardiac Enzymes No results for input(s): CKTOTAL, CKMB, CKMBINDEX, TROPONINI in the last 72 hours.  BNP: BNP (last 3 results) No results for input(s): BNP in the last 8760 hours.  ProBNP (last 3 results) No results for input(s): PROBNP in the last 8760 hours.   D-Dimer No results for input(s): DDIMER in the last 72 hours. Hemoglobin A1C No results for input(s): HGBA1C in the last 72 hours. Fasting Lipid Panel No results for input(s): CHOL, HDL, LDLCALC, TRIG, CHOLHDL, LDLDIRECT in the last 72 hours. Thyroid Function Tests No results for input(s): TSH, T4TOTAL, T3FREE, THYROIDAB in the last 72 hours.  Invalid input(s): FREET3  Other results:   Imaging    DG Chest 2 View  Result Date:  05/29/2020 CLINICAL DATA:  ICD EXAM: CHEST - 2 VIEW COMPARISON:  05/24/2020 FINDINGS: Interval removal of endotracheal and nasogastric tubes. New LEFT subclavian ICD with leads projecting at RIGHT atrium and RIGHT ventricle. Normal heart size and mediastinal contours. Improved pulmonary edema. No pleural effusion or pneumothorax. Advanced degenerative changes LEFT glenohumeral joint. IMPRESSION: No pneumothorax following ICD placement. Improved pulmonary edema. Electronically Signed   By: Lavonia Dana M.D.   On: 05/29/2020 08:11     Medications:     Scheduled Medications: .  aspirin  81 mg Oral Daily  . atorvastatin  80 mg Oral Daily  . chlorhexidine gluconate (MEDLINE KIT)  15 mL Mouth Rinse BID  . Chlorhexidine Gluconate Cloth  6 each Topical Daily  . cyclobenzaprine  5 mg Oral TID  . docusate sodium  100 mg Oral BID  . furosemide  40 mg Oral Daily  . gabapentin  200 mg Oral BID  . insulin aspart  0-20 Units Subcutaneous Q4H  . insulin detemir  34 Units Subcutaneous BID  . mouth rinse  15 mL Mouth Rinse BID  . multivitamin with minerals  1 tablet Oral Daily  . mupirocin ointment  1 application Nasal BID  . pantoprazole  40 mg Oral BID AC  . potassium chloride  20 mEq Oral Daily  . sodium chloride flush  3 mL Intravenous Q12H  . sodium chloride flush  3 mL Intravenous Q12H  . sodium chloride flush  3 mL Intravenous Q12H    Infusions: . sodium chloride Stopped (05/25/20 1831)  . sodium chloride Stopped (05/28/20 1200)    PRN Medications: sodium chloride, acetaminophen, diclofenac Sodium, ketotifen, lip balm, nitroGLYCERIN, ondansetron (ZOFRAN) IV, oxyCODONE, polyvinyl alcohol, sodium chloride flush, sodium chloride flush  Assessment/Plan   1. Shock: Primarily vasodilatory shock, suspect due to prolonged hypotension with arrest and tissue ischemia. Echo with LV EF in the 45% range with mildly dilated RV but function appeared relatively normal.  Resolved, now off dobutamine and pressors.  2. Acute on chronic HF with mid-rage EF: Ischemic cardiomyopathy, EF in the 45% range on initial echo but now back up to 60-65% on repeat limited echo 5/26.  Suspect stunning with arrest and now improved.  Patient was markedly volume overloaded, now diuresed well. Volume status looks ok at this point.   - Continue Lasix 40 mg po daily.  - start  blocker today now that ICD has been placed.  3. CAD: Patient appears to have CTO of the mid RCA with good collaterals.  I do not think this was acute MI from plaque rupture.  HS-TnI significantly elevated, but think this was  generalized ischemia from hypotension with prolonged cardiac arrest.  - Continue ASA. Per EP, hold Plavix x 3 days post ICD  - Continue statin.  4. Cardiac arrest: VF arrest with immediate CPR but prolonged time to ROSC (about 45 minutes). Suspect scar-mediated from old RCA occlusion.  Stopped amio drip with 3rd degree. EF now normal.   - ICD placed 5/26 for secondary prevention.    4. AKI: Creatinine peaked 3.06-->2.86-->2.67-->2.34-->2.07->1.85-->1.8-->1.6->1.5.  Suspect ATN in setting of prolonged cardiac arrest.  5. Acute hypoxemic respiratory failure: CXR with bilateral upper lobe infiltrates likely due to aspiration with arrest.  Extubated 5/22. Sats stable on nasal cannula.  - He has completed antibiotics.  7. Elevated LFTs: Relatively mild considering down-time.  Shock liver.  Trending down.  8. Diabetes: Insulin.  9. Thrombocytopenia: Resolved.  10. 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  No further HB.  - ICD placed 5/26    Lyda Jester, PA-C  05/29/2020 8:35 AM  Patient seen with PA, agree with the above note.    Creatinine still trending down, 1.5 today.  ICD placed yesterday.    Continue Lasix 40 mg daily for now.  Add Coreg 3.125 mg bid.   Needs PT, agree with CIR.  He is ready from a cardiac standpoint.  Will arrange outpatient followup.  Will follow at a distance over weekend (call with questions), will see again next week if still an inpatient.   Loralie Champagne 05/29/2020 8:59 AM

## 2020-05-29 NOTE — Plan of Care (Signed)
  Problem: Coping: Goal: Level of anxiety will decrease Outcome: Completed/Met   Problem: Safety: Goal: Ability to remain free from injury will improve Outcome: Completed/Met   Problem: Skin Integrity: Goal: Risk for impaired skin integrity will decrease Outcome: Completed/Met

## 2020-05-29 NOTE — Discharge Instructions (Signed)
Supplemental Discharge Instructions for  Pacemaker/Defibrillator Patients  Activity No heavy lifting or vigorous activity with your left/right arm for 6 to 8 weeks.  Do not raise your left/right arm above your head for one week.  Gradually raise your affected arm as drawn below.              06/04/20                      06/05/20                      06/06/20                     06/07/20 __  NO DRIVING 6 months.  WOUND CARE - Keep the wound area clean and dry.  Do not get this area wet , no showersuntil cleared to at your wound check visit . - The tape/steri-strips on your wound will fall off; do not pull them off.  No bandage is needed on the site.  DO  NOT apply any creams, oils, or ointments to the wound area. - If you notice any drainage or discharge from the wound, any swelling or bruising at the site, or you develop a fever > 101? F after you are discharged home, call the office at once.  Special Instructions - You are still able to use cellular telephones; use the ear opposite the side where you have your pacemaker/defibrillator.  Avoid carrying your cellular phone near your device. - When traveling through airports, show security personnel your identification card to avoid being screened in the metal detectors.  Ask the security personnel to use the hand wand. - Avoid arc welding equipment, MRI testing (magnetic resonance imaging), TENS units (transcutaneous nerve stimulators).  Call the office for questions about other devices. - Avoid electrical appliances that are in poor condition or are not properly grounded. - Microwave ovens are safe to be near or to operate.  Additional information for defibrillator patients should your device go off: - If your device goes off ONCE and you feel fine afterward, notify the device clinic nurses. - If your device goes off ONCE and you do not feel well afterward, call 911. - If your device goes off TWICE, call 911. - If your device goes off THREE  times in one day, call 911.  DO NOT DRIVE YOURSELF OR A FAMILY MEMBER WITH A DEFIBRILLATOR TO THE HOSPITAL--CALL 911.     Heart Healthy, Consistent Carbohydrate Nutrition Therapy   A heart-healthy and consistent carbohydrate diet is recommended to manage heart disease and diabetes. To follow a heart-healthy and consistent carbohydrate diet, . Eat a balanced diet with whole grains, fruits and vegetables, and lean protein sources.  . Choose heart-healthy unsaturated fats. Limit saturated fats, trans fats, and cholesterol intake. Eat more plant-based or vegetarian meals using beans and soy foods for protein.  . Eat whole, unprocessed foods to limit the amount of sodium (salt) you eat.  . Choose a consistent amount of carbohydrate at each meal and snack. Limit refined carbohydrates especially sugar, sweets and sugar-sweetened beverages.  . If you drink alcohol, do so in moderation: one serving per day (women) and two servings per day (men). o One serving is equivalent to 12 ounces beer, 5 ounces wine, or 1.5 ounces distilled spirits  Tips Tips for Choosing Heart-Healthy Fats Choose lean protein and low-fat dairy foods to reduce saturated fat intake. . Saturated fat  is usually found in animal-based protein and is associated with certain health risks. Saturated fat is the biggest contributor to raise low-density lipoprotein (LDL) cholesterol levels. Research shows that limiting saturated fat lowers unhealthy cholesterol levels. Eat no more than 7% of your total calories each day from saturated fat. Ask your RDN to help you determine how much saturated fat is right for you. . There are many foods that do not contain large amounts of saturated fats. Swapping these foods to replace foods high in saturated fats will help you limit the saturated fat you eat and improve your cholesterol levels. You can also try eating more plant-based or vegetarian meals. Instead of. Try:  Whole milk, cheese, yogurt,  and ice cream 1% or skim milk, low-fat cheese, non-fat yogurt, and low-fat ice cream  Fatty, marbled beef and pork Lean beef, pork, or venison  Poultry with skin Poultry without skin  Butter, stick margarine Reduced-fat, whipped, or liquid spreads  Coconut oil, palm oil Liquid vegetable oils: corn, canola, olive, soybean and safflower oils   Avoid foods that contain trans fats. . Trans fats increase levels of LDL-cholesterol. Hydrogenated fat in processed foods is the main source of trans fats in foods.  . Trans fats can be found in stick margarine, shortening, processed sweets, baked goods, some fried foods, and packaged foods made with hydrogenated oils. Avoid foods with "partially hydrogenated oil" on the ingredient list such as: cookies, pastries, baked goods, biscuits, crackers, microwave popcorn, and frozen dinners. Choose foods with heart healthy fats. . Polyunsaturated and monounsaturated fat are unsaturated fats that may help lower your blood cholesterol level when used in place of saturated fat in your diet. . Ask your RDN about taking a dietary supplement with plant sterols and stanols to help lower your cholesterol level. Marland Kitchen Research shows that substituting saturated fats with unsaturated fats is beneficial to cholesterol levels. Try these easy swaps: Instead of. Try:  Butter, stick margarine, or solid shortening Reduced-fat, whipped, or liquid spreads  Beef, pork, or poultry with skin Fish and seafood  Chips, crackers, snack foods Raw or unsalted nuts and seeds or nut butters Hummus with vegetables Avocado on toast  Coconut oil, palm oil Liquid vegetable oils: corn, canola, olive, soybean and safflower oils  Limit the amount of cholesterol you eat to less than 200 milligrams per day. . Cholesterol is a substance carried through the bloodstream via lipoproteins, which are known as "transporters" of fat. Some body functions need cholesterol to work properly, but too much cholesterol in  the bloodstream can damage arteries and build up blood vessel linings (which can lead to heart attack and stroke). You should eat less than 200 milligrams cholesterol per day. Marland Kitchen People respond differently to eating cholesterol. There is no test available right now that can figure out which people will respond more to dietary cholesterol and which will respond less. For individuals with high intake of dietary cholesterol, different types of increase (none, small, moderate, large) in LDL-cholesterol levels are all possible.  . Food sources of cholesterol include egg yolks and organ meats such as liver, gizzards. Limit egg yolks to two to four per week and avoid organ meats like liver and gizzards to control cholesterol intake. Tips for Choosing Heart-Healthy Carbohydrates Consume a consistent amount of carbohydrate . It is important to eat foods with carbohydrates in moderation because they impact your blood glucose level. Carbohydrates can be found in many foods such as: . Grains (breads, crackers, rice, pasta, and cereals)  .  Starchy Vegetables (potatoes, corn, and peas)  . Beans and legumes  . Milk, soy milk, and yogurt  . Fruit and fruit juice  . Sweets (cakes, cookies, ice cream, jam and jelly) . Your RDN will help you set a goal for how many carbohydrate servings to eat at your meals and snacks. For many adults, eating 3 to 5 servings of carbohydrate foods at each meal and 1 or 2 carbohydrate servings for each snack works well.  . Check your blood glucose level regularly. It can tell you if you need to adjust when you eat carbohydrates. . Choose foods rich in viscous (soluble) fiber . Viscous, or soluble, is found in the walls of plant cells. Viscous fiber is found only in plant-based foods. Eating foods with fiber helps to lower your unhealthy cholesterol and keep your blood glucose in range  . Rich sources of viscous fiber include vegetables (asparagus, Brussels sprouts, sweet potatoes,  turnips) fruit (apricots, mangoes, oranges), legumes, and whole grains (barley, oats, and oat bran).  . As you increase your fiber intake gradually, also increase the amount of water you drink. This will help prevent constipation.  . If you have difficulty achieving this goal, ask your RDN about fiber laxatives. Choose fiber supplements made with viscous fibers such as psyllium seed husks or methylcellulose to help lower unhealthy cholesterol.  . Limit refined carbohydrates  . There are three types of carbohydrates: starches, sugar, and fiber. Some carbohydrates occur naturally in food, like the starches in rice or corn or the sugars in fruits and milk. Refined carbohydrates--foods with high amounts of simple sugars--can raise triglyceride levels. High triglyceride levels are associated with coronary heart disease. . Some examples of refined carbohydrate foods are table sugar, sweets, and beverages sweetened with added sugar. Tips for Reducing Sodium (Salt) Although sodium is important for your body to function, too much sodium can be harmful for people with high blood pressure. As sodium and fluid buildup in your tissues and bloodstream, your blood pressure increases. High blood pressure may cause damage to other organs and increase your risk for a stroke. Even if you take a pill for blood pressure or a water pill (diuretic) to remove fluid, it is still important to have less salt in your diet. Ask your doctor and RDN what amount of sodium is right for you. Marland Kitchen Avoid processed foods. Eat more fresh foods.  . Fresh fruits and vegetables are naturally low in sodium, as well as frozen vegetables and fruits that have no added juices or sauces.  . Fresh meats are lower in sodium than processed meats, such as bacon, sausage, and hotdogs. Read the nutrition label or ask your butcher to help you find a fresh meat that is low in sodium. . Eat less salt--at the table and when cooking.  . A single teaspoon of table  salt has 2,300 mg of sodium.  . Leave the salt out of recipes for pasta, casseroles, and soups.  . Ask your RDN how to cook your favorite recipes without sodium . Be a Paramedic.  . Look for food packages that say "salt-free" or "sodium-free." These items contain less than 5 milligrams of sodium per serving.  Marland Kitchen "Very low-sodium" products contain less than 35 milligrams of sodium per serving.  Marland Kitchen "Low-sodium" products contain less than 140 milligrams of sodium per serving.  . Beware for "Unsalted" or "No Added Salt" products. These items may still be high in sodium. Check the nutrition label. . Add flavors  to your food without adding sodium.  . Try lemon juice, lime juice, fruit juice or vinegar.  . Dry or fresh herbs add flavor. Try basil, bay leaf, dill, rosemary, parsley, sage, dry mustard, nutmeg, thyme, and paprika.  . Pepper, red pepper flakes, and cayenne pepper can add spice t your meals without adding sodium. Hot sauce contains sodium, but if you use just a drop or two, it will not add up to much.  Sharyn Lull a sodium-free seasoning blend or make your own at home. Additional Lifestyle Tips Achieve and maintain a healthy weight. . Talk with your RDN or your doctor about what is a healthy weight for you. . Set goals to reach and maintain that weight.  . To lose weight, reduce your calorie intake along with increasing your physical activity. A weight loss of 10 to 15 pounds could reduce LDL-cholesterol by 5 milligrams per deciliter. Participate in physical activity. . Talk with your health care team to find out what types of physical activity are best for you. Set a plan to get about 30 minutes of exercise on most days.  Foods Recommended Food Group Foods Recommended  Grains Whole grain breads and cereals, including whole wheat, barley, rye, buckwheat, corn, teff, quinoa, millet, amaranth, brown or wild rice, sorghum, and oats Pasta, especially whole wheat or other whole grain types   [employer], quinoa or wild rice Whole grain crackers, bread, rolls, pitas Home-made bread with reduced-sodium baking soda  Protein Foods Lean cuts of beef and pork (loin, leg, round, extra lean hamburger)  Skinless Cytogeneticist and other wild game Dried beans and peas Nuts and nut butters Meat alternatives made with soy or textured vegetable protein  Egg whites or egg substitute Cold cuts made with lean meat or soy protein  Dairy Nonfat (skim), low-fat, or 1%-fat milk  Nonfat or low-fat yogurt or cottage cheese Fat-free and low-fat cheese  Vegetables Fresh, frozen, or canned vegetables without added fat or salt   Fruits Fresh, frozen, canned, or dried fruit   Oils Unsaturated oils (corn, olive, peanut, soy, sunflower, canola)  Soft or liquid margarines and vegetable oil spreads  Salad dressings Seeds and nuts  Avocado   Foods Not Recommended Food Group Foods Not Recommended  Grains Breads or crackers topped with salt Cereals (hot or cold) with more than 300 mg sodium per serving Biscuits, cornbread, and other "quick" breads prepared with baking soda Bread crumbs or stuffing mix from a store High-fat bakery products, such as doughnuts, biscuits, croissants, danish pastries, pies, cookies Instant cooking foods to which you add hot water and stir--potatoes, noodles, rice, etc. Packaged starchy foods--seasoned noodle or rice dishes, stuffing mix, macaroni and cheese dinner Snacks made with partially hydrogenated oils, including chips, cheese puffs, snack mixes, regular crackers, butter-flavored popcorn  Protein Foods Higher-fat cuts of meats (ribs, t-bone steak, regular hamburger) Bacon, sausage, or hot dogs Cold cuts, such as salami or bologna, deli meats, cured meats, corned beef Organ meats (liver, brains, gizzards, sweetbreads) Poultry with skin Fried or smoked meat, poultry, and fish Whole eggs and egg yolks (more than 2-4 per week) Salted legumes, nuts, seeds, or  nut/seed butters Meat alternatives with high levels of sodium (>300 mg per serving) or saturated fat (>5 g per serving)  Dairy Whole milk,?2% fat milk, buttermilk Whole milk yogurt or ice cream Cream Half-&-half Cream cheese Sour cream Cheese  Vegetables Canned or frozen vegetables with salt, fresh vegetables prepared with salt, butter, cheese, or cream sauce  Fried vegetables Pickled vegetables such as olives, pickles, or sauerkraut  Fruits Fried fruits Fruits served with butter or cream  Oils Butter, stick margarine, shortening Partially hydrogenated oils or trans fats Tropical oils (coconut, palm, palm kernel oils)  Other Candy, sugar sweetened soft drinks and desserts Salt, sea salt, garlic salt, and seasoning mixes containing salt Bouillon cubes Ketchup, barbecue sauce, Worcestershire sauce, soy sauce, teriyaki sauce Miso Salsa Pickles, olives, relish   Heart Healthy Consistent Carbohydrate Vegetarian (Lacto-Ovo) Sample 1-Day Menu  Breakfast 1 cup oatmeal, cooked (2 carbohydrate servings)   cup blueberries (1 carbohydrate serving)  11 almonds, without salt  1 cup 1% milk (1 carbohydrate serving)  1 cup coffee  Morning Snack 1 cup fat-free plain yogurt (1 carbohydrate serving)  Lunch 1 whole wheat bun (1 carbohydrate servings)  1 black bean burger (1 carbohydrate servings)  1 slice cheddar cheese, low sodium  2 slices tomatoes  2 leaves lettuce  1 teaspoon mustard  1 small pear (1 carbohydrate servings)  1 cup green tea, unsweetened  Afternoon Snack 1/3 cup trail mix with nuts, seeds, and raisins, without salt (1 carbohydrate servinga)  Evening Meal  cup meatless chicken  2/3 cup brown rice, cooked (2 carbohydrate servings)  1 cup broccoli, cooked (2/3 carbohydrate serving)   cup carrots, cooked (1/3 carbohydrate serving)  2 teaspoons olive oil  1 teaspoon balsamic vinegar  1 whole wheat dinner roll (1 carbohydrate serving)  1 teaspoon margarine, soft, tub  1  cup 1% milk (1 carbohydrate serving)  Evening Snack 1 extra small banana (1 carbohydrate serving)  1 tablespoon peanut butter   Heart Healthy Consistent Carbohydrate Vegan Sample 1-Day Menu  Breakfast 1 cup oatmeal, cooked (2 carbohydrate servings)   cup blueberries (1 carbohydrate serving)  11 almonds, without salt  1 cup soymilk fortified with calcium, vitamin B12, and vitamin D  1 cup coffee  Morning Snack 6 ounces soy yogurt (1 carbohydrate servings)  Lunch 1 whole wheat bun(1 carbohydrate servings)  1 black bean burger (1 carbohydrate serving)  2 slices tomatoes  2 leaves lettuce  1 teaspoon mustard  1 small pear (1 carbohydrate servings)  1 cup green tea, unsweetened  Afternoon Snack 1/3 cup trail mix with nuts, seeds, and raisins, without salt (1 carbohydrate servings)  Evening Meal  cup meatless chicken  2/3 cup brown rice, cooked (2 carbohydrate servings)  1 cup broccoli, cooked (2/3 carbohydrate serving)   cup carrots, cooked (1/3 carbohydrate serving)  2 teaspoons olive oil  1 teaspoon balsamic vinegar  1 whole wheat dinner roll (1 carbohydrate serving)  1 teaspoon margarine, soft, tub  1 cup soymilk fortified with calcium, vitamin B12, and vitamin D  Evening Snack 1 extra small banana (1 carbohydrate serving)  1 tablespoon peanut butter    Heart Healthy Consistent Carbohydrate Sample 1-Day Menu  Breakfast 1 cup cooked oatmeal (2 carbohydrate servings)  3/4 cup blueberries (1 carbohydrate serving)  1 ounce almonds  1 cup skim milk (1 carbohydrate serving)  1 cup coffee  Morning Snack 1 cup sugar-free nonfat yogurt (1 carbohydrate serving)  Lunch 2 slices whole-wheat bread (2 carbohydrate servings)  2 ounces lean [place] breast  1 ounce low-fat Swiss cheese  1 teaspoon mustard  1 slice tomato  1 lettuce leaf  1 small pear (1 carbohydrate serving)  1 cup skim milk (1 carbohydrate serving)  Afternoon Snack 1 ounce trail mix with unsalted nuts, seeds, and  raisins (1 carbohydrate serving)  Evening Meal 3 ounces salmon  2/3 cup cooked brown rice (2 carbohydrate servings)  1 teaspoon soft margarine  1 cup cooked broccoli with 1/2 cup cooked carrots (1 carbohydrate serving  Carrots, cooked, boiled, drained, without salt  1 cup lettuce  1 teaspoon olive oil with vinegar for dressing  1 small whole grain roll (1 carbohydrate serving)  1 teaspoon soft margarine  1 cup unsweetened tea  Evening Snack 1 extra-small banana (1 carbohydrate serving)  Copyright 2020  [employer]. All rights reserved.

## 2020-05-30 DIAGNOSIS — I469 Cardiac arrest, cause unspecified: Secondary | ICD-10-CM | POA: Diagnosis not present

## 2020-05-30 LAB — GLUCOSE, CAPILLARY
Glucose-Capillary: 184 mg/dL — ABNORMAL HIGH (ref 70–99)
Glucose-Capillary: 196 mg/dL — ABNORMAL HIGH (ref 70–99)
Glucose-Capillary: 200 mg/dL — ABNORMAL HIGH (ref 70–99)
Glucose-Capillary: 208 mg/dL — ABNORMAL HIGH (ref 70–99)
Glucose-Capillary: 251 mg/dL — ABNORMAL HIGH (ref 70–99)
Glucose-Capillary: 286 mg/dL — ABNORMAL HIGH (ref 70–99)
Glucose-Capillary: 357 mg/dL — ABNORMAL HIGH (ref 70–99)

## 2020-05-30 LAB — COMPREHENSIVE METABOLIC PANEL
ALT: 28 U/L (ref 0–44)
AST: 24 U/L (ref 15–41)
Albumin: 3.1 g/dL — ABNORMAL LOW (ref 3.5–5.0)
Alkaline Phosphatase: 95 U/L (ref 38–126)
Anion gap: 9 (ref 5–15)
BUN: 37 mg/dL — ABNORMAL HIGH (ref 8–23)
CO2: 28 mmol/L (ref 22–32)
Calcium: 8.6 mg/dL — ABNORMAL LOW (ref 8.9–10.3)
Chloride: 99 mmol/L (ref 98–111)
Creatinine, Ser: 1.41 mg/dL — ABNORMAL HIGH (ref 0.61–1.24)
GFR, Estimated: 55 mL/min — ABNORMAL LOW (ref >60)
Glucose, Bld: 207 mg/dL — ABNORMAL HIGH (ref 70–99)
Potassium: 3.7 mmol/L (ref 3.5–5.1)
Sodium: 136 mmol/L (ref 135–145)
Total Bilirubin: 1.8 mg/dL — ABNORMAL HIGH (ref 0.3–1.2)
Total Protein: 6.3 g/dL — ABNORMAL LOW (ref 6.5–8.1)

## 2020-05-30 LAB — CBC
HCT: 42.5 % (ref 39.0–52.0)
Hemoglobin: 13.7 g/dL (ref 13.0–17.0)
MCH: 29 pg (ref 26.0–34.0)
MCHC: 32.2 g/dL (ref 30.0–36.0)
MCV: 90 fL (ref 80.0–100.0)
Platelets: 377 10*3/uL (ref 150–400)
RBC: 4.72 MIL/uL (ref 4.22–5.81)
RDW: 14.6 % (ref 11.5–15.5)
WBC: 9.1 10*3/uL (ref 4.0–10.5)
nRBC: 0 % (ref 0.0–0.2)

## 2020-05-30 MED ORDER — INSULIN ASPART 100 UNIT/ML IJ SOLN
6.0000 [IU] | Freq: Three times a day (TID) | INTRAMUSCULAR | Status: DC
  Administered 2020-05-30 – 2020-06-02 (×9): 6 [IU] via SUBCUTANEOUS

## 2020-05-30 NOTE — Progress Notes (Signed)
PROGRESS NOTE   [name]  EHU:[id number] DOB: 1955/09/06 DOA: 05/19/2020 PCP: [facility]  Brief Narrative:  65 year old white male community dwelling Inferior wall STEMI VF arrest 05/19/2020 with 45-50 minutes CPR subsequent hypotension ST elevation inferior leads Cardiac cath = mid RCA total occlusion Rx Heparin DAPT Rx vasopressors for distributive shock  5/17 Admitted to [facility] as transfer from [place]. S/p VF arrest with 45 mins CPR before ROSC. Left femoral line inserted. C/f inferior STEMI. 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 LA and troponins downtrending. Weaning Giapreza. SVR and CO remain suboptimal  5/19 Weaning DBA, Giapreza. Starting lasix gtt. Able to follow commands. Had an episode of VF, self-aborted. Was given additional amiodarone. 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 TOC to look into ensuring he has machine that is working properly DM TY 2 Continue current Levemir 38 twice daily (home meds 80 at bedtime) and resistant scale Insulin dosing adjusted--will continue to adjust Hold Jardiance at d/c Resume slowly gabapentin at lower dose 200 4 times daily HTN Holding at this time Prinzide 1 daily ATN on admission Secondary to shock physiology which is now resolved Creatinine  improving BMI 40 Enlist help of nutritionist-patient interested in weight loss exercise and diet counseling-admits to indiscretions with diet   DVT prophylaxis: SCDs currently Code Status: Full Family Communication: Discussed 5/28 with wife Mechele Claude at bedside-her number [phone] Disposition:  Status is: Inpatient  Remains inpatient appropriate because:Hemodynamically unstable, IV treatments appropriate due to intensity of illness or inability to take PO and Inpatient level of care appropriate due to severity of illness   Dispo: The patient is from: Home              Anticipated d/c is to: CIR              Patient currently is not medically stable to d/c.   Difficult to place patient No  Consultants:   Cardiology  EP  Procedures: Multiple  Antimicrobials: Perioperative only  Subjective:  Fair No new issue Some nasal congestion   Objective: Vitals:   05/29/20 2327 05/30/20 0408 05/30/20 0900 05/30/20 1145  BP: (!) 127/52 (!) 152/78 (!) 96/51 (!) 142/72  Pulse: 86 77 89 81  Resp: $Remo'19 19 18 17  'vLZgB$ Temp: 98.5 F (36.9 C) 98.4 F (36.9 C) 97.8 F (36.6 C) 98.3 F (36.8 C)  TempSrc: Oral Oral Oral Oral  SpO2: 93% 96% 97% 98%  Weight:  127.6 kg    Height:        Intake/Output Summary (Last 24 hours) at 05/30/2020 1521 Last data filed at 05/30/2020 0800 Gross per 24 hour  Intake 720 ml  Output 1525 ml  Net -805 ml   Filed Weights   05/28/20 0500 05/29/20 0348 05/30/20 0408  Weight: 132.4 kg 126.4 kg 127.6 kg    Examination:  Thick neck Cannot appreciate JVD Decreased AE Somewhat swollen lower extremities Abdomen obese cannot  obtain clear exam of liver or spleen   Data Reviewed: personally reviewed   CBC    Component Value Date/Time   WBC 9.1 05/30/2020 0555   RBC 4.72 05/30/2020 0555   HGB 13.7 05/30/2020 0555   HCT 42.5 05/30/2020 0555   PLT 377 05/30/2020 0555   MCV 90.0 05/30/2020 0555   MCH 29.0 05/30/2020 0555   MCHC 32.2 05/30/2020 0555    RDW 14.6 05/30/2020 0555   LYMPHSABS 4.1 (H) 05/19/2020 1818   MONOABS 0.7 05/19/2020 1818   EOSABS 0.1 05/19/2020 1818   BASOSABS 0.1 05/19/2020 1818   CMP Latest Ref Rng & Units 05/30/2020 05/29/2020 05/28/2020  Glucose 70 - 99 mg/dL 207(H) 142(H) 160(H)  BUN 8 - 23 mg/dL 37(H) 39(H) 43(H)  Creatinine 0.61 - 1.24 mg/dL 1.41(H) 1.52(H) 1.60(H)  Sodium 135 - 145 mmol/L 136 138 139  Potassium 3.5 - 5.1 mmol/L 3.7 3.6 3.8  Chloride 98 - 111 mmol/L 99 102 100  CO2 22 - 32 mmol/L $RemoveB'28 28 27  'HrBEdnuN$ Calcium 8.9 - 10.3 mg/dL 8.6(L) 8.8(L) 8.7(L)  Total Protein 6.5 - 8.1 g/dL 6.3(L) - -  Total Bilirubin 0.3 - 1.2 mg/dL 1.8(H) - -  Alkaline Phos 38 - 126 U/L 95 - -  AST 15 - 41 U/L 24 - -  ALT 0 - 44 U/L 28 - -     Radiology Studies: DG Chest 2 View  Result Date: 05/29/2020 CLINICAL DATA:  ICD EXAM: CHEST - 2 VIEW COMPARISON:  05/24/2020 FINDINGS: Interval removal of endotracheal and nasogastric tubes. New LEFT subclavian ICD with leads projecting at RIGHT atrium and RIGHT ventricle. Normal heart size and mediastinal contours. Improved pulmonary edema. No pleural effusion or pneumothorax. Advanced degenerative changes LEFT glenohumeral joint. IMPRESSION: No pneumothorax following ICD placement. Improved pulmonary edema. Electronically Signed   By: Lavonia Dana M.D.   On: 05/29/2020 08:11     Scheduled Meds: . aspirin  81 mg Oral Daily  . atorvastatin  80 mg Oral Daily  . carvedilol  3.125 mg Oral BID WC  . chlorhexidine gluconate (MEDLINE KIT)  15 mL Mouth Rinse BID  . Chlorhexidine Gluconate Cloth  6 each Topical Daily  . cyclobenzaprine  5 mg Oral TID  . docusate sodium  100 mg Oral BID  . furosemide  40 mg Oral Daily  . gabapentin  200 mg Oral BID  . insulin aspart  0-15 Units Subcutaneous TID WC  . insulin aspart  4 Units Subcutaneous TID WC  . insulin detemir  38 Units Subcutaneous BID  . mouth rinse  15 mL Mouth Rinse BID  . multivitamin with minerals  1 tablet Oral Daily  .  pantoprazole  40 mg Oral BID AC  . potassium chloride  20 mEq Oral Daily  . sodium chloride flush  3 mL Intravenous Q12H  . sodium chloride flush  3 mL Intravenous Q12H  . sodium chloride flush  3 mL Intravenous Q12H   Continuous Infusions: . sodium chloride Stopped (05/25/20 1831)  . sodium chloride Stopped (05/28/20 1200)     LOS: 11 days   Time spent: [place], MD [facility] To contact the attending provider between 7A-7P or the covering provider during after hours 7P-7A, please log into the web site [email or web address] and access using universal [place] password for that web site. If you do not have the password, please call the hospital operator.  05/30/2020, 3:21 PM

## 2020-05-30 NOTE — Progress Notes (Addendum)
[id number] MI education completed with pt and family. Discussed MI restrictions,heart healthy food choices, low carb food choices, risk factors and CRP 2. Reviewed NTG use, encouraged IS and flutter valve. Will begin walking with pt when he is progressing with PT. Pt would like to consider CRP 2 as goal in the future after he finishes with PT. Graylon Good RN BSN 05/30/2020 3:19 PM

## 2020-05-31 DIAGNOSIS — I469 Cardiac arrest, cause unspecified: Secondary | ICD-10-CM | POA: Diagnosis not present

## 2020-05-31 LAB — COMPREHENSIVE METABOLIC PANEL
ALT: 45 U/L — ABNORMAL HIGH (ref 0–44)
AST: 50 U/L — ABNORMAL HIGH (ref 15–41)
Albumin: 2.9 g/dL — ABNORMAL LOW (ref 3.5–5.0)
Alkaline Phosphatase: 86 U/L (ref 38–126)
Anion gap: 9 (ref 5–15)
BUN: 32 mg/dL — ABNORMAL HIGH (ref 8–23)
CO2: 26 mmol/L (ref 22–32)
Calcium: 8.3 mg/dL — ABNORMAL LOW (ref 8.9–10.3)
Chloride: 101 mmol/L (ref 98–111)
Creatinine, Ser: 1.28 mg/dL — ABNORMAL HIGH (ref 0.61–1.24)
GFR, Estimated: >60 mL/min (ref >60)
Glucose, Bld: 152 mg/dL — ABNORMAL HIGH (ref 70–99)
Potassium: 3.5 mmol/L (ref 3.5–5.1)
Sodium: 136 mmol/L (ref 135–145)
Total Bilirubin: 1.2 mg/dL (ref 0.3–1.2)
Total Protein: 6.6 g/dL (ref 6.5–8.1)

## 2020-05-31 LAB — GLUCOSE, CAPILLARY
Glucose-Capillary: 148 mg/dL — ABNORMAL HIGH (ref 70–99)
Glucose-Capillary: 227 mg/dL — ABNORMAL HIGH (ref 70–99)
Glucose-Capillary: 219 mg/dL — ABNORMAL HIGH (ref 70–99)
Glucose-Capillary: 237 mg/dL — ABNORMAL HIGH (ref 70–99)

## 2020-05-31 LAB — CBC
HCT: 40.5 % (ref 39.0–52.0)
Hemoglobin: 13 g/dL (ref 13.0–17.0)
MCH: 29 pg (ref 26.0–34.0)
MCHC: 32.1 g/dL (ref 30.0–36.0)
MCV: 90.4 fL (ref 80.0–100.0)
Platelets: 355 10*3/uL (ref 150–400)
RBC: 4.48 MIL/uL (ref 4.22–5.81)
RDW: 14.6 % (ref 11.5–15.5)
WBC: 9.9 10*3/uL (ref 4.0–10.5)
nRBC: 0 % (ref 0.0–0.2)

## 2020-05-31 MED ORDER — SODIUM CHLORIDE 0.9% FLUSH: 10.0000 mL | INTRAVENOUS | Status: DC | PRN

## 2020-05-31 MED ORDER — ALUM & MAG HYDROXIDE-SIMETH 200-200-20 MG/5ML PO SUSP
15.0000 mL | Freq: Once | ORAL | Status: AC
  Administered 2020-05-31: 15 mL via ORAL
  Filled 2020-05-31: qty 30

## 2020-05-31 NOTE — Progress Notes (Signed)
Patient had 4 beat run on wide vtach/QRS tonight , will continue to monitor for more irregularity.

## 2020-05-31 NOTE — Progress Notes (Signed)
PROGRESS NOTE   [name]  DGL:[id number] DOB: 1955-07-17 DOA: 05/19/2020 PCP: [facility]  Brief Narrative:  65 year old white male community dwelling Inferior wall STEMI VF arrest 05/19/2020 with 45-50 minutes CPR subsequent hypotension ST elevation inferior leads Cardiac cath = mid RCA total occlusion Rx Heparin DAPT Rx vasopressors for distributive shock  5/17 Admitted to [facility] as transfer from [place]. S/p VF arrest with 45 mins CPR before ROSC. Left femoral line inserted. C/f inferior STEMI. 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 LA and troponins downtrending. Weaning Giapreza. SVR and CO remain suboptimal  5/19 Weaning DBA, Giapreza. Starting lasix gtt. Able to follow commands. Had an episode of VF, self-aborted. Was given additional amiodarone. 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 TOC to look into ensuring he has machine that is working properly DM TY 2 Continue current Levemir 38 twice daily (home meds 80 at bedtime) and resistant scale Insulin dosing adjusted--will continue to adjust Hold Jardiance at d/c Resume slowly gabapentin at lower dose 200 4 times daily HTN Holding  Prinzide 1 daily ATN on admission Secondary to shock physiology which is now  resolved Creatinine improving BMI 40 Enlist help of nutritionist-patient interested in weight loss exercise and diet counseling-admits to indiscretions with diet   DVT prophylaxis: SCDs currently Code Status: Full Family Communication: Discussed with son and daughter in law in detail at the bedside Disposition:  Status is: Inpatient  Remains inpatient appropriate because:Hemodynamically unstable, IV treatments appropriate due to intensity of illness or inability to take PO and Inpatient level of care appropriate due to severity of illness   Dispo: The patient is from: Home              Anticipated d/c is to: CIR              Patient currently is not medically stable to d/c.   Difficult to place patient No  Consultants:   Cardiology  EP  Procedures: Multiple  Antimicrobials: Perioperative only  Subjective:  Sitting in chair Continue to look better daily   Objective: Vitals:   05/31/20 0520 05/31/20 0800 05/31/20 1156 05/31/20 1200  BP: 126/71   (!) 118/56  Pulse:    77  Resp:    15  Temp:  98.3 F (36.8 C) 98.4 F (36.9 C)   TempSrc:  Oral Oral   SpO2:    100%  Weight:      Height:        Intake/Output Summary (Last 24 hours) at 05/31/2020 1538 Last data filed at 05/31/2020 1317 Gross per 24 hour  Intake 1080 ml  Output 1000 ml  Net 80 ml   Filed Weights   05/29/20 0348 05/30/20 0408 05/31/20 0500  Weight: 126.4 kg 127.6 kg 126.6 kg    Examination:  Thick neck Cannot appreciate JVD Decreased AE less swollen lower extremities Abdomen obese cannot obtain clear exam  of liver or spleen L arm in trangle sling PPM site non tender but limits LUE mobility   Data Reviewed: personally reviewed   CBC    Component Value Date/Time   WBC 9.9 05/31/2020 0627   RBC 4.48 05/31/2020 0627   HGB 13.0 05/31/2020 0627   HCT 40.5 05/31/2020 0627   PLT 355 05/31/2020 0627   MCV 90.4 05/31/2020 0627   MCH 29.0 05/31/2020 0627   MCHC 32.1 05/31/2020 0627    RDW 14.6 05/31/2020 0627   LYMPHSABS 4.1 (H) 05/19/2020 1818   MONOABS 0.7 05/19/2020 1818   EOSABS 0.1 05/19/2020 1818   BASOSABS 0.1 05/19/2020 1818   CMP Latest Ref Rng & Units 05/31/2020 05/30/2020 05/29/2020  Glucose 70 - 99 mg/dL 152(H) 207(H) 142(H)  BUN 8 - 23 mg/dL 32(H) 37(H) 39(H)  Creatinine 0.61 - 1.24 mg/dL 1.28(H) 1.41(H) 1.52(H)  Sodium 135 - 145 mmol/L 136 136 138  Potassium 3.5 - 5.1 mmol/L 3.5 3.7 3.6  Chloride 98 - 111 mmol/L 101 99 102  CO2 22 - 32 mmol/L $RemoveB'26 28 28  'MKPrUTOh$ Calcium 8.9 - 10.3 mg/dL 8.3(L) 8.6(L) 8.8(L)  Total Protein 6.5 - 8.1 g/dL 6.6 6.3(L) -  Total Bilirubin 0.3 - 1.2 mg/dL 1.2 1.8(H) -  Alkaline Phos 38 - 126 U/L 86 95 -  AST 15 - 41 U/L 50(H) 24 -  ALT 0 - 44 U/L 45(H) 28 -     Radiology Studies: No results found.   Scheduled Meds: . aspirin  81 mg Oral Daily  . atorvastatin  80 mg Oral Daily  . carvedilol  3.125 mg Oral BID WC  . chlorhexidine gluconate (MEDLINE KIT)  15 mL Mouth Rinse BID  . Chlorhexidine Gluconate Cloth  6 each Topical Daily  . cyclobenzaprine  5 mg Oral TID  . docusate sodium  100 mg Oral BID  . furosemide  40 mg Oral Daily  . gabapentin  200 mg Oral BID  . insulin aspart  0-15 Units Subcutaneous TID WC  . insulin aspart  6 Units Subcutaneous TID WC  . insulin detemir  38 Units Subcutaneous BID  . mouth rinse  15 mL Mouth Rinse BID  . multivitamin with minerals  1 tablet Oral Daily  . pantoprazole  40 mg Oral BID AC  . potassium chloride  20 mEq Oral Daily  . sodium chloride flush  3 mL Intravenous Q12H  . sodium chloride flush  3 mL Intravenous Q12H  . sodium chloride flush  3 mL Intravenous Q12H   Continuous Infusions: . sodium chloride Stopped (05/25/20 1831)  . sodium chloride Stopped (05/28/20 1200)     LOS: 12 days   Time spent: [place], MD [facility] To contact the attending provider between 7A-7P or the covering provider during after hours 7P-7A, please log into the web site  [email or web address] and access using universal [place] password for that web site. If you do not have the password, please call the hospital operator.  05/31/2020, 3:38 PM

## 2020-05-31 NOTE — Plan of Care (Signed)

## 2020-05-31 NOTE — Plan of Care (Signed)
  Problem: Education: Goal: Knowledge of General Education information will improve Description: Including pain rating scale, medication(s)/side effects and non-pharmacologic comfort measures Outcome: Progressing   Problem: Health Behavior/Discharge Planning: Goal: Ability to manage health-related needs will improve Outcome: Progressing   Problem: Clinical Measurements: Goal: Respiratory complications will improve Outcome: Progressing   Problem: Activity: Goal: Risk for activity intolerance will decrease Outcome: Progressing   Problem: Pain Managment: Goal: General experience of comfort will improve Outcome: Progressing   

## 2020-06-01 DIAGNOSIS — I469 Cardiac arrest, cause unspecified: Secondary | ICD-10-CM | POA: Diagnosis not present

## 2020-06-01 DIAGNOSIS — I50810 Right heart failure, unspecified: Secondary | ICD-10-CM

## 2020-06-01 LAB — CBC
HCT: 42.4 % (ref 39.0–52.0)
Hemoglobin: 13.7 g/dL (ref 13.0–17.0)
MCH: 29.3 pg (ref 26.0–34.0)
MCHC: 32.3 g/dL (ref 30.0–36.0)
MCV: 90.6 fL (ref 80.0–100.0)
Platelets: 366 10*3/uL (ref 150–400)
RBC: 4.68 MIL/uL (ref 4.22–5.81)
RDW: 14.8 % (ref 11.5–15.5)
WBC: 11.9 10*3/uL — ABNORMAL HIGH (ref 4.0–10.5)
nRBC: 0 % (ref 0.0–0.2)

## 2020-06-01 LAB — BASIC METABOLIC PANEL
Anion gap: 7 (ref 5–15)
BUN: 33 mg/dL — ABNORMAL HIGH (ref 8–23)
CO2: 24 mmol/L (ref 22–32)
Calcium: 8.8 mg/dL — ABNORMAL LOW (ref 8.9–10.3)
Chloride: 104 mmol/L (ref 98–111)
Creatinine, Ser: 1.21 mg/dL (ref 0.61–1.24)
GFR, Estimated: >60 mL/min (ref >60)
Glucose, Bld: 208 mg/dL — ABNORMAL HIGH (ref 70–99)
Potassium: 4 mmol/L (ref 3.5–5.1)
Sodium: 135 mmol/L (ref 135–145)

## 2020-06-01 LAB — GLUCOSE, CAPILLARY
Glucose-Capillary: 197 mg/dL — ABNORMAL HIGH (ref 70–99)
Glucose-Capillary: 215 mg/dL — ABNORMAL HIGH (ref 70–99)
Glucose-Capillary: 235 mg/dL — ABNORMAL HIGH (ref 70–99)
Glucose-Capillary: 281 mg/dL — ABNORMAL HIGH (ref 70–99)

## 2020-06-01 NOTE — Progress Notes (Signed)
PROGRESS NOTE   [name]  KRD:[id number] DOB: 1955/04/26 DOA: 05/19/2020 PCP: [facility]  Brief Narrative:  65 year old white male community dwelling Inferior wall STEMI VF arrest 05/19/2020 with 45-50 minutes CPR subsequent hypotension ST elevation inferior leads Cardiac cath = mid RCA total occlusion Rx Heparin DAPT Rx vasopressors for distributive shock  5/17 Admitted to [facility] [place] as transfer from [facility]. S/p VF arrest with 45 mins CPR before ROSC. Left femoral line inserted. C/f inferior STEMI. 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 LA and troponins downtrending. Weaning Giapreza. SVR and CO remain suboptimal  5/19 Weaning DBA, Giapreza. Starting lasix gtt. Able to follow commands. Had an episode of VF, self-aborted. Was given additional amiodarone. 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 TOC to look into ensuring he has machine that is working properly--consult placed 5/30 DM TY 2 Continue current Levemir 38 twice daily (home meds 80 at bedtime) and resistant scale Insulin dosing adjusted--will continue to adjust Hold Jardiance at d/c Resume slowly gabapentin at lower dose 200 4 times daily HTN Holding Prinzide 1 daily--other maeds as above ATN on admission Secondary to  shock physiology which is now resolved Creatinine improving BMI 40  per nutritionist-patient interested in weight loss OP GOP?   DVT prophylaxis: SCDs currently Code Status: Full Family Communication: Discussed with wife at bedsdie Disposition:  Status is: Inpatient  Remains inpatient appropriate because:Hemodynamically unstable, IV treatments appropriate due to intensity of illness or inability to take PO and Inpatient level of care appropriate due to severity of illness   Dispo: The patient is from: Home              Anticipated d/c is to: CIR              Patient currently is not medically stable to d/c.   Difficult to place patient No  Consultants:   Cardiology  EP  Procedures: Multiple  Antimicrobials: Perioperative only  Subjective:  He looks much better--more coherent and not on oxygen today Await rehab bed   Objective: Vitals:   06/01/20 0554 06/01/20 0748 06/01/20 1056 06/01/20 1535  BP:  105/86 (!) 151/74 (!) 142/70  Pulse:  80 74 80  Resp:  18 15 17   Temp:  98.4 F (36.9 C) (!) 97.4 F (36.3 C) (!) 97.5 F (36.4 C)  TempSrc:  Oral Oral Oral  SpO2:  97% 95% 95%  Weight: 127.1 kg     Height:        Intake/Output Summary (Last 24 hours) at 06/01/2020 1753 Last data filed at 06/01/2020 0835 Gross per 24 hour  Intake 1080 ml  Output 950 ml  Net 130 ml   Filed Weights   05/30/20 0408 05/31/20 0500 06/01/20 0554  Weight: 127.6 kg 126.6 kg 127.1 kg    Examination:  Thick neck Cannot appreciate JVD cta b less swollen lower extremities Abdomen obese cannot obtain clear  exam of liver or spleen Sling gone PPM site non tender--some ability to move hand  Data Reviewed: personally reviewed   CBC    Component Value Date/Time   WBC 11.9 (H) 06/01/2020 0602   RBC 4.68 06/01/2020 0602   HGB 13.7 06/01/2020 0602   HCT 42.4 06/01/2020 0602   PLT 366 06/01/2020 0602   MCV 90.6 06/01/2020 0602   MCH 29.3 06/01/2020 0602   MCHC 32.3 06/01/2020  0602   RDW 14.8 06/01/2020 0602   LYMPHSABS 4.1 (H) 05/19/2020 1818   MONOABS 0.7 05/19/2020 1818   EOSABS 0.1 05/19/2020 1818   BASOSABS 0.1 05/19/2020 1818   CMP Latest Ref Rng & Units 06/01/2020 05/31/2020 05/30/2020  Glucose 70 - 99 mg/dL 208(H) 152(H) 207(H)  BUN 8 - 23 mg/dL 33(H) 32(H) 37(H)  Creatinine 0.61 - 1.24 mg/dL 1.21 1.28(H) 1.41(H)  Sodium 135 - 145 mmol/L 135 136 136  Potassium 3.5 - 5.1 mmol/L 4.0 3.5 3.7  Chloride 98 - 111 mmol/L 104 101 99  CO2 22 - 32 mmol/L $RemoveB'24 26 28  'sRwygNYU$ Calcium 8.9 - 10.3 mg/dL 8.8(L) 8.3(L) 8.6(L)  Total Protein 6.5 - 8.1 g/dL - 6.6 6.3(L)  Total Bilirubin 0.3 - 1.2 mg/dL - 1.2 1.8(H)  Alkaline Phos 38 - 126 U/L - 86 95  AST 15 - 41 U/L - 50(H) 24  ALT 0 - 44 U/L - 45(H) 28     Radiology Studies: No results found.   Scheduled Meds: . aspirin  81 mg Oral Daily  . atorvastatin  80 mg Oral Daily  . carvedilol  3.125 mg Oral BID WC  . chlorhexidine gluconate (MEDLINE KIT)  15 mL Mouth Rinse BID  . Chlorhexidine Gluconate Cloth  6 each Topical Daily  . cyclobenzaprine  5 mg Oral TID  . docusate sodium  100 mg Oral BID  . furosemide  40 mg Oral Daily  . gabapentin  200 mg Oral BID  . insulin aspart  0-15 Units Subcutaneous TID WC  . insulin aspart  6 Units Subcutaneous TID WC  . insulin detemir  38 Units Subcutaneous BID  . mouth rinse  15 mL Mouth Rinse BID  . multivitamin with minerals  1 tablet Oral Daily  . pantoprazole  40 mg Oral BID AC  . potassium chloride  20 mEq Oral Daily  . sodium chloride flush  3 mL Intravenous Q12H  . sodium chloride flush  3 mL Intravenous Q12H  . sodium chloride flush  3 mL Intravenous Q12H   Continuous Infusions: . sodium chloride Stopped (05/25/20 1831)  . sodium chloride Stopped (05/28/20 1200)     LOS: 13 days   Time spent: [place], MD [facility] To contact the attending provider between 7A-7P or the covering provider during after hours 7P-7A, please log into the web  site [email or web address] and access using universal [place] password for that web site. If you do not have the password, please call the hospital operator.  06/01/2020, 5:53 PM

## 2020-06-01 NOTE — Progress Notes (Signed)
Occupational Therapy Treatment Patient Details Name: [name] MRN: [id number] DOB: May 28, 1955 Today's Date: 06/01/2020    History of present illness 65 yo admitted 5/17 with OOH VF arrest with >45 min CPR. After arrival at [facility] pt with hypotension evening of 5/17 and emesis with STEMI. Intubated 5/17-5/22. 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 PMhx: HTN, HLD, DM, GERD, chronic pain, RBBB, Left THA   OT comments  Pt seen in conjunction with PT to maximize pts activity tolerance. Pt making excellent progress towards OT goals with pt able to complete functional mobility greater than a household distance with Rw and min A - min guard assist with close chair follow, increased assist needed as pt fatigued. Pt declined BADL participation but education provided on  compensatory methods needed to maintain ICD precautions. Pt may progress to [place], but feel pt could benefit from intensity and structure of CIR, will follow acutely per POC.   Follow Up Recommendations  CIR    Equipment Recommendations  Other (comment) (TBD at next venue of care)    Recommendations for Other Services      Precautions / Restrictions Precautions Precautions: Fall;ICD/Pacemaker Required Braces or Orthoses: Sling Restrictions Weight Bearing Restrictions: Yes Other Position/Activity Restrictions: pacemaker precautions       Mobility Bed Mobility               General bed mobility comments: pt OOB in recliner upon arrival and returned pt to recliner at end of session    Transfers Overall transfer level: Needs assistance Equipment used: Rolling walker (2 wheeled) Transfers: Sit to/from Stand Sit to Stand: Min guard;+2 safety/equipment         General transfer comment: cues for hand placement for sit<>stand, min guard for safety to rise from recliner    Balance Overall balance assessment: Needs assistance Sitting-balance support: Feet unsupported;No upper extremity supported Sitting balance-Leahy Scale: Fair      Standing balance support: During functional activity Standing balance-Leahy Scale: Poor Standing balance comment: requries BUE support                           ADL either performed or assessed with clinical judgement   ADL Overall ADL's : Needs assistance/impaired                   Upper Body Dressing Details (indicate cue type and reason): education provided on compensatory methods for UB dressing to pacer precautions     Toilet Transfer: Min guard;Minimal assistance;RW;Ambulation;+2 for safety/equipment Toilet Transfer Details (indicate cue type and reason): simulated via functional mobility with RW and +2 assist with chair follow         Functional mobility during ADLs: Min guard;Minimal assistance;+2 for safety/equipment;Rolling walker General ADL Comments: education provide ADL participation with ICD precautions     Vision       Perception     Praxis      Cognition Arousal/Alertness: Awake/alert Behavior During Therapy: WFL for tasks assessed/performed Overall Cognitive Status: Within Functional Limits for tasks assessed                                          Exercises Other Exercises Other Exercises: L shoulder elevation<>depression, protraction<>retraction Other Exercises: pt reports chronic numbess in L digits but moving digits WFL Other Exercises: education provided on using LUE functionally ( wrist, hand and elbow) but limiting  shoulder movement to < 90*   Shoulder Instructions       General Comments pt on RA during session with SpO2 89-92%, BP prior to mobility- 105/86, BP after mobility- 125/78, HR WFL, pt could likely progress past CIR placement    Pertinent Vitals/ Pain       Pain Assessment: Faces Faces Pain Scale: Hurts a little bit Pain Location: back Pain Descriptors / Indicators: Discomfort;Grimacing;Guarding Pain Intervention(s): Limited activity within patient's tolerance;Monitored during  session;Repositioned  Home Living                                          Prior Functioning/Environment              Frequency  Min 2X/week        Progress Toward Goals  OT Goals(current goals can now be found in the care plan section)  Progress towards OT goals: Progressing toward goals  Acute Rehab OT Goals Patient Stated Goal: to get better OT Goal Formulation: With patient Time For Goal Achievement: 06/11/20 Potential to Achieve Goals: Good  Plan Discharge plan remains appropriate;Frequency remains appropriate    Co-evaluation    PT/OT/SLP Co-Evaluation/Treatment: Yes Reason for Co-Treatment: For patient/therapist safety;To address functional/ADL transfers   OT goals addressed during session: ADL's and self-care;Other (comment) (ICD precautions in relation ADLs)      AM-PAC OT "6 Clicks" Daily Activity     Outcome Measure   Help from another person eating meals?: None Help from another person taking care of personal grooming?: A Little Help from another person toileting, which includes using toliet, bedpan, or urinal?: A Little Help from another person bathing (including washing, rinsing, drying)?: A Little Help from another person to put on and taking off regular upper body clothing?: A Little Help from another person to put on and taking off regular lower body clothing?: A Little 6 Click Score: 19    End of Session Equipment Utilized During Treatment: Gait belt;Rolling walker  OT Visit Diagnosis: Other abnormalities of gait and mobility (R26.89);Muscle weakness (generalized) (M62.81)   Activity Tolerance Patient tolerated treatment well   Patient Left in chair;with call bell/phone within reach;with chair alarm set   Nurse Communication Mobility status        Time: 3159-4585 OT Time Calculation (min): 37 min  Charges: OT General Charges $OT Visit: 1 Visit OT Treatments $Self Care/Home Management : 8-22 mins  Harley Alto., COTA/L Acute Rehabilitation Services [place] 06/01/2020, 1:20 PM

## 2020-06-01 NOTE — Progress Notes (Signed)
Physical Therapy Treatment Patient Details Name: [name] MRN: [id number] DOB: 08/07/1955 Today's Date: 06/01/2020    History of Present Illness 65 yo admitted 5/17 with OOH VF arrest with >45 min CPR. After arrival at [facility] pt with hypotension evening of 5/17 and emesis with STEMI. Intubated 5/17-5/22. 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 PMhx: HTN, HLD, DM, GERD, chronic pain, RBBB, Left THA    PT Comments    Pt received by PT/OT in chair, alert and motivated to mobilize. Pt requires min-guard A +2 with safety for sit>stand as well as vc's to not use LUE. Pt ambulated 93' with RW minimizing LUE wt bearing, needed 3 standing rest breaks as well as chair behind due to pt being unsteady with knees beginning to buckle last 5'. Pt reports chest discomfort with mobility. PT will continue to follow.    Follow Up Recommendations  CIR;Supervision/Assistance - 24 hour     Equipment Recommendations  None recommended by PT    Recommendations for Other Services       Precautions / Restrictions Precautions Precautions: Fall;ICD/Pacemaker Required Braces or Orthoses: Sling Restrictions Weight Bearing Restrictions: Yes Other Position/Activity Restrictions: pacemaker precautions    Mobility  Bed Mobility               General bed mobility comments: pt OOB in recliner upon arrival and returned pt to recliner at end of session    Transfers Overall transfer level: Needs assistance Equipment used: Rolling walker (2 wheeled) Transfers: Sit to/from Stand Sit to Stand: Min guard;+2 safety/equipment         General transfer comment: cues for hand placement for sit<>stand, min guard for safety to rise from recliner, vc's to not put significant wt through LUE  Ambulation/Gait Ambulation/Gait assistance: Min assist;+2 safety/equipment Gait Distance (Feet): 80 Feet Assistive device: Rolling walker (2 wheeled) Gait Pattern/deviations: Wide base of support Gait velocity: decreased Gait velocity  interpretation: <1.31 ft/sec, indicative of household ambulator General Gait Details: needed 3 standing rest breaks. Chair brought behind as pt was unsteady and knees buckling last 5'. Pt needed cues to self monitor   Stairs             Wheelchair Mobility    Modified Rankin (Stroke Patients Only)       Balance Overall balance assessment: Needs assistance Sitting-balance support: Feet unsupported;No upper extremity supported Sitting balance-Leahy Scale: Fair Sitting balance - Comments: supervision for safety.   Standing balance support: During functional activity Standing balance-Leahy Scale: Poor Standing balance comment: requries BUE support                            Cognition Arousal/Alertness: Awake/alert Behavior During Therapy: WFL for tasks assessed/performed Overall Cognitive Status: Within Functional Limits for tasks assessed                                        Exercises Other Exercises Other Exercises: L shoulder elevation<>depression, protraction<>retraction Other Exercises: pt reports chronic numbess in L digits but moving digits WFL Other Exercises: education provided on using LUE functionally ( wrist, hand and elbow) but limiting shoulder movement to < 90*    General Comments General comments (skin integrity, edema, etc.): SpO2 89-94% on RA while ambulating. BP 125/78, HR WFL      Pertinent Vitals/Pain Pain Assessment: Faces Faces Pain Scale: Hurts a little bit Pain Location: back  and chest with mobility Pain Descriptors / Indicators: Discomfort;Grimacing;Guarding Pain Intervention(s): Limited activity within patient's tolerance;Monitored during session    Home Living                      Prior Function            PT Goals (current goals can now be found in the care plan section) Acute Rehab PT Goals Patient Stated Goal: to get better PT Goal Formulation: With patient Time For Goal Achievement:  06/09/20 Potential to Achieve Goals: Good Progress towards PT goals: Progressing toward goals    Frequency    Min 3X/week      PT Plan Current plan remains appropriate    Co-evaluation PT/OT/SLP Co-Evaluation/Treatment: Yes Reason for Co-Treatment: For patient/therapist safety PT goals addressed during session: Mobility/safety with mobility;Balance;Proper use of DME OT goals addressed during session: ADL's and self-care;Other (comment) (ICD precautions in relation ADLs)      AM-PAC PT "6 Clicks" Mobility   Outcome Measure  Help needed turning from your back to your side while in a flat bed without using bedrails?: A Lot Help needed moving from lying on your back to sitting on the side of a flat bed without using bedrails?: A Lot Help needed moving to and from a bed to a chair (including a wheelchair)?: A Little Help needed standing up from a chair using your arms (e.g., wheelchair or bedside chair)?: A Little Help needed to walk in hospital room?: A Lot Help needed climbing 3-5 steps with a railing? : Total 6 Click Score: 13    End of Session Equipment Utilized During Treatment: Gait belt Activity Tolerance: Patient tolerated treatment well Patient left: in chair;with call bell/phone within reach Nurse Communication: Mobility status PT Visit Diagnosis: Other abnormalities of gait and mobility (R26.89);Difficulty in walking, not elsewhere classified (R26.2);Muscle weakness (generalized) (M62.81);Pain Pain - Right/Left: Left Pain - part of body:  (chest, back)     Time: 0254-2706 PT Time Calculation (min) (ACUTE ONLY): 37 min  Charges:  $Gait Training: 8-22 mins                     [place]  Pager [phone] Office [place] 06/01/2020, 1:29 PM

## 2020-06-01 NOTE — Progress Notes (Signed)
Inpatient Rehab Admissions Coordinator:   No update from [place] and they are closed today.  Left a voicemail with case manager to please f/u with me tomorrow regarding auth request.   Shann Medal, PT, DPT Admissions Coordinator [phone] 06/01/20  9:36 AM

## 2020-06-02 ENCOUNTER — Encounter (HOSPITAL_COMMUNITY): Payer: Self-pay | Admitting: Physical Medicine & Rehabilitation

## 2020-06-02 ENCOUNTER — Inpatient Hospital Stay (HOSPITAL_COMMUNITY)
Admission: RE | Admit: 2020-06-02 | Discharge: 2020-06-10 | DRG: 945 | Disposition: A | Discharge status: Home / Self Care | Payer: No Typology Code available for payment source | Source: Intra-hospital | Attending: Physical Medicine & Rehabilitation | Admitting: Physical Medicine & Rehabilitation

## 2020-06-02 ENCOUNTER — Other Ambulatory Visit: Payer: Self-pay

## 2020-06-02 DIAGNOSIS — Z809 Family history of malignant neoplasm, unspecified: Secondary | ICD-10-CM

## 2020-06-02 DIAGNOSIS — D696 Thrombocytopenia, unspecified: Secondary | ICD-10-CM | POA: Diagnosis present

## 2020-06-02 DIAGNOSIS — Z811 Family history of alcohol abuse and dependence: Secondary | ICD-10-CM | POA: Diagnosis not present

## 2020-06-02 DIAGNOSIS — E1169 Type 2 diabetes mellitus with other specified complication: Secondary | ICD-10-CM

## 2020-06-02 DIAGNOSIS — Z87891 Personal history of nicotine dependence: Secondary | ICD-10-CM | POA: Diagnosis not present

## 2020-06-02 DIAGNOSIS — I440 Atrioventricular block, first degree: Secondary | ICD-10-CM | POA: Diagnosis present

## 2020-06-02 DIAGNOSIS — I453 Trifascicular block: Secondary | ICD-10-CM | POA: Diagnosis present

## 2020-06-02 DIAGNOSIS — Z794 Long term (current) use of insulin: Secondary | ICD-10-CM

## 2020-06-02 DIAGNOSIS — G894 Chronic pain syndrome: Secondary | ICD-10-CM | POA: Diagnosis not present

## 2020-06-02 DIAGNOSIS — E785 Hyperlipidemia, unspecified: Secondary | ICD-10-CM | POA: Diagnosis present

## 2020-06-02 DIAGNOSIS — Z96643 Presence of artificial hip joint, bilateral: Secondary | ICD-10-CM | POA: Diagnosis present

## 2020-06-02 DIAGNOSIS — E1165 Type 2 diabetes mellitus with hyperglycemia: Secondary | ICD-10-CM | POA: Diagnosis present

## 2020-06-02 DIAGNOSIS — G4733 Obstructive sleep apnea (adult) (pediatric): Secondary | ICD-10-CM | POA: Diagnosis present

## 2020-06-02 DIAGNOSIS — R5381 Other malaise: Principal | ICD-10-CM | POA: Diagnosis present

## 2020-06-02 DIAGNOSIS — Z95810 Presence of automatic (implantable) cardiac defibrillator: Secondary | ICD-10-CM

## 2020-06-02 DIAGNOSIS — Z96652 Presence of left artificial knee joint: Secondary | ICD-10-CM | POA: Diagnosis present

## 2020-06-02 DIAGNOSIS — E8809 Other disorders of plasma-protein metabolism, not elsewhere classified: Secondary | ICD-10-CM

## 2020-06-02 DIAGNOSIS — Z8371 Family history of colonic polyps: Secondary | ICD-10-CM | POA: Diagnosis not present

## 2020-06-02 DIAGNOSIS — N179 Acute kidney failure, unspecified: Secondary | ICD-10-CM

## 2020-06-02 DIAGNOSIS — D62 Acute posthemorrhagic anemia: Secondary | ICD-10-CM

## 2020-06-02 DIAGNOSIS — Z6838 Body mass index (BMI) 38.0-38.9, adult: Secondary | ICD-10-CM

## 2020-06-02 DIAGNOSIS — I10 Essential (primary) hypertension: Secondary | ICD-10-CM | POA: Diagnosis present

## 2020-06-02 DIAGNOSIS — E669 Obesity, unspecified: Secondary | ICD-10-CM | POA: Diagnosis present

## 2020-06-02 DIAGNOSIS — K219 Gastro-esophageal reflux disease without esophagitis: Secondary | ICD-10-CM | POA: Diagnosis present

## 2020-06-02 DIAGNOSIS — Z886 Allergy status to analgesic agent status: Secondary | ICD-10-CM

## 2020-06-02 DIAGNOSIS — K5900 Constipation, unspecified: Secondary | ICD-10-CM | POA: Diagnosis not present

## 2020-06-02 DIAGNOSIS — Z7982 Long term (current) use of aspirin: Secondary | ICD-10-CM

## 2020-06-02 DIAGNOSIS — I4901 Ventricular fibrillation: Secondary | ICD-10-CM

## 2020-06-02 DIAGNOSIS — I469 Cardiac arrest, cause unspecified: Secondary | ICD-10-CM

## 2020-06-02 DIAGNOSIS — Z79899 Other long term (current) drug therapy: Secondary | ICD-10-CM

## 2020-06-02 DIAGNOSIS — Z7984 Long term (current) use of oral hypoglycemic drugs: Secondary | ICD-10-CM

## 2020-06-02 DIAGNOSIS — Z885 Allergy status to narcotic agent status: Secondary | ICD-10-CM

## 2020-06-02 DIAGNOSIS — Z981 Arthrodesis status: Secondary | ICD-10-CM | POA: Diagnosis not present

## 2020-06-02 DIAGNOSIS — M545 Low back pain, unspecified: Secondary | ICD-10-CM | POA: Diagnosis present

## 2020-06-02 DIAGNOSIS — E46 Unspecified protein-calorie malnutrition: Secondary | ICD-10-CM | POA: Diagnosis present

## 2020-06-02 LAB — GLUCOSE, CAPILLARY
Glucose-Capillary: 188 mg/dL — ABNORMAL HIGH (ref 70–99)
Glucose-Capillary: 212 mg/dL — ABNORMAL HIGH (ref 70–99)
Glucose-Capillary: 224 mg/dL — ABNORMAL HIGH (ref 70–99)
Glucose-Capillary: 228 mg/dL — ABNORMAL HIGH (ref 70–99)

## 2020-06-02 MED ORDER — POLYVINYL ALCOHOL 1.4 % OP SOLN: 1.0000 [drp] | OPHTHALMIC | Status: DC | PRN

## 2020-06-02 MED ORDER — BISACODYL 10 MG RE SUPP: 10.0000 mg | Freq: Every day | RECTAL | Status: DC | PRN

## 2020-06-02 MED ORDER — ATORVASTATIN CALCIUM 80 MG PO TABS
80.0000 mg | ORAL_TABLET | Freq: Every day | ORAL | Status: DC
  Administered 2020-06-03 – 2020-06-10 (×8): 80 mg via ORAL
  Filled 2020-06-02 (×8): qty 1

## 2020-06-02 MED ORDER — CARVEDILOL 3.125 MG PO TABS
3.1250 mg | ORAL_TABLET | Freq: Two times a day (BID) | ORAL | Status: DC
  Administered 2020-06-02 – 2020-06-10 (×16): 3.125 mg via ORAL
  Filled 2020-06-02 (×16): qty 1

## 2020-06-02 MED ORDER — INSULIN DETEMIR 100 UNIT/ML ~~LOC~~ SOLN
38.0000 [IU] | Freq: Two times a day (BID) | SUBCUTANEOUS | Status: DC
  Administered 2020-06-02 – 2020-06-08 (×12): 38 [IU] via SUBCUTANEOUS
  Filled 2020-06-02 (×13): qty 0.38

## 2020-06-02 MED ORDER — LIP MEDEX EX OINT: TOPICAL_OINTMENT | CUTANEOUS | Status: DC | PRN

## 2020-06-02 MED ORDER — PROCHLORPERAZINE 25 MG RE SUPP: 12.5000 mg | Freq: Four times a day (QID) | RECTAL | Status: DC | PRN

## 2020-06-02 MED ORDER — PROCHLORPERAZINE MALEATE 5 MG PO TABS: 5.0000 mg | ORAL_TABLET | Freq: Four times a day (QID) | ORAL | Status: DC | PRN

## 2020-06-02 MED ORDER — CYCLOBENZAPRINE HCL 5 MG PO TABS
5.0000 mg | ORAL_TABLET | Freq: Three times a day (TID) | ORAL | Status: DC
  Administered 2020-06-02 – 2020-06-10 (×23): 5 mg via ORAL
  Filled 2020-06-02 (×23): qty 1

## 2020-06-02 MED ORDER — OXYCODONE HCL 5 MG PO TABS: 5.0000 mg | ORAL_TABLET | ORAL | 0 refills | Status: DC | PRN

## 2020-06-02 MED ORDER — PROCHLORPERAZINE EDISYLATE 10 MG/2ML IJ SOLN: 5.0000 mg | Freq: Four times a day (QID) | INTRAMUSCULAR | Status: DC | PRN

## 2020-06-02 MED ORDER — OXYCODONE HCL 5 MG PO TABS
5.0000 mg | ORAL_TABLET | ORAL | Status: DC | PRN
Start: 2020-06-02 — End: 2020-06-10
  Administered 2020-06-03 – 2020-06-06 (×6): 10 mg via ORAL
  Administered 2020-06-06: 5 mg via ORAL
  Administered 2020-06-07 – 2020-06-10 (×8): 10 mg via ORAL
  Administered 2020-06-10: 5 mg via ORAL
  Filled 2020-06-02: qty 1
  Filled 2020-06-02 (×10): qty 2
  Filled 2020-06-02: qty 1
  Filled 2020-06-02 (×6): qty 2
  Filled 2020-06-02: qty 1

## 2020-06-02 MED ORDER — DIPHENHYDRAMINE HCL 12.5 MG/5ML PO ELIX: 12.5000 mg | ORAL_SOLUTION | Freq: Four times a day (QID) | ORAL | Status: DC | PRN

## 2020-06-02 MED ORDER — EXERCISE FOR HEART AND HEALTH BOOK
Freq: Once | Status: AC
  Filled 2020-06-02: qty 1

## 2020-06-02 MED ORDER — CLOPIDOGREL BISULFATE 75 MG PO TABS: 75.0000 mg | ORAL_TABLET | Freq: Every day | ORAL | Status: DC

## 2020-06-02 MED ORDER — CLOPIDOGREL BISULFATE 75 MG PO TABS
75.0000 mg | ORAL_TABLET | Freq: Every day | ORAL | Status: DC
  Administered 2020-06-03 – 2020-06-10 (×8): 75 mg via ORAL
  Filled 2020-06-02 (×8): qty 1

## 2020-06-02 MED ORDER — ORAL CARE MOUTH RINSE
15.0000 mL | Freq: Two times a day (BID) | OROMUCOSAL | Status: DC
  Administered 2020-06-02 – 2020-06-10 (×11): 15 mL via OROMUCOSAL

## 2020-06-02 MED ORDER — CLOPIDOGREL BISULFATE 75 MG PO TABS
75.0000 mg | ORAL_TABLET | Freq: Every day | ORAL | Status: DC
  Administered 2020-06-02: 75 mg via ORAL
  Filled 2020-06-02: qty 1

## 2020-06-02 MED ORDER — INSULIN ASPART 100 UNIT/ML IJ SOLN: 0.0000 [IU] | Freq: Three times a day (TID) | INTRAMUSCULAR | 11 refills | Status: DC

## 2020-06-02 MED ORDER — FLEET ENEMA 7-19 GM/118ML RE ENEM: 1.0000 | ENEMA | Freq: Once | RECTAL | Status: DC | PRN

## 2020-06-02 MED ORDER — GABAPENTIN 100 MG PO CAPS: 200.0000 mg | ORAL_CAPSULE | Freq: Two times a day (BID) | ORAL | Status: DC

## 2020-06-02 MED ORDER — DICLOFENAC SODIUM 1 % EX GEL: 4.0000 g | Freq: Three times a day (TID) | CUTANEOUS | Status: DC | PRN

## 2020-06-02 MED ORDER — GABAPENTIN 100 MG PO CAPS
200.0000 mg | ORAL_CAPSULE | Freq: Two times a day (BID) | ORAL | Status: DC
  Administered 2020-06-02 – 2020-06-10 (×16): 200 mg via ORAL
  Filled 2020-06-02 (×16): qty 2

## 2020-06-02 MED ORDER — KETOTIFEN FUMARATE 0.025 % OP SOLN: 1.0000 [drp] | Freq: Three times a day (TID) | OPHTHALMIC | Status: DC | PRN

## 2020-06-02 MED ORDER — ATORVASTATIN CALCIUM 80 MG PO TABS: 80.0000 mg | ORAL_TABLET | Freq: Every day | ORAL | 0 refills | Status: DC

## 2020-06-02 MED ORDER — ACETAMINOPHEN 325 MG PO TABS: 325.0000 mg | ORAL_TABLET | ORAL | Status: DC | PRN

## 2020-06-02 MED ORDER — ACETAMINOPHEN 325 MG PO TABS
650.0000 mg | ORAL_TABLET | ORAL | Status: DC | PRN
  Administered 2020-06-03 – 2020-06-06 (×3): 650 mg via ORAL
  Filled 2020-06-02 (×3): qty 2

## 2020-06-02 MED ORDER — CYCLOBENZAPRINE HCL 5 MG PO TABS: 5.0000 mg | ORAL_TABLET | Freq: Three times a day (TID) | ORAL | 0 refills | Status: DC

## 2020-06-02 MED ORDER — POLYETHYLENE GLYCOL 3350 17 G PO PACK
17.0000 g | PACK | Freq: Every day | ORAL | Status: DC | PRN
  Administered 2020-06-04: 17 g via ORAL
  Filled 2020-06-02: qty 1

## 2020-06-02 MED ORDER — GUAIFENESIN-DM 100-10 MG/5ML PO SYRP: 5.0000 mL | ORAL_SOLUTION | Freq: Four times a day (QID) | ORAL | Status: DC | PRN

## 2020-06-02 MED ORDER — ALUM & MAG HYDROXIDE-SIMETH 200-200-20 MG/5ML PO SUSP
30.0000 mL | ORAL | Status: DC | PRN
  Administered 2020-06-03: 30 mL via ORAL
  Filled 2020-06-02: qty 30

## 2020-06-02 MED ORDER — POTASSIUM CHLORIDE CRYS ER 20 MEQ PO TBCR
20.0000 meq | EXTENDED_RELEASE_TABLET | Freq: Every day | ORAL | Status: DC
  Administered 2020-06-03 – 2020-06-10 (×8): 20 meq via ORAL
  Filled 2020-06-02 (×8): qty 1

## 2020-06-02 MED ORDER — NITROGLYCERIN 0.4 MG SL SUBL: 0.4000 mg | SUBLINGUAL_TABLET | SUBLINGUAL | Status: DC | PRN

## 2020-06-02 MED ORDER — INSULIN ASPART 100 UNIT/ML IJ SOLN
0.0000 [IU] | Freq: Three times a day (TID) | INTRAMUSCULAR | Status: DC
  Administered 2020-06-02: 2 [IU] via SUBCUTANEOUS
  Administered 2020-06-03: 3 [IU] via SUBCUTANEOUS
  Administered 2020-06-03: 8 [IU] via SUBCUTANEOUS
  Administered 2020-06-03 – 2020-06-04 (×2): 5 [IU] via SUBCUTANEOUS
  Administered 2020-06-04 (×2): 3 [IU] via SUBCUTANEOUS
  Administered 2020-06-05: 5 [IU] via SUBCUTANEOUS
  Administered 2020-06-05: 3 [IU] via SUBCUTANEOUS
  Administered 2020-06-05: 2 [IU] via SUBCUTANEOUS
  Administered 2020-06-06: 5 [IU] via SUBCUTANEOUS
  Administered 2020-06-06: 3 [IU] via SUBCUTANEOUS
  Administered 2020-06-06 – 2020-06-07 (×2): 2 [IU] via SUBCUTANEOUS
  Administered 2020-06-07: 8 [IU] via SUBCUTANEOUS
  Administered 2020-06-07: 5 [IU] via SUBCUTANEOUS
  Administered 2020-06-08 (×2): 2 [IU] via SUBCUTANEOUS
  Administered 2020-06-08: 3 [IU] via SUBCUTANEOUS
  Administered 2020-06-09: 2 [IU] via SUBCUTANEOUS
  Administered 2020-06-09: 5 [IU] via SUBCUTANEOUS
  Administered 2020-06-09: 3 [IU] via SUBCUTANEOUS

## 2020-06-02 MED ORDER — DICLOFENAC SODIUM 1 % EX GEL
2.0000 g | Freq: Four times a day (QID) | CUTANEOUS | Status: DC
  Administered 2020-06-02 – 2020-06-09 (×13): 2 g via TOPICAL
  Filled 2020-06-02: qty 100

## 2020-06-02 MED ORDER — PANTOPRAZOLE SODIUM 40 MG PO TBEC
40.0000 mg | DELAYED_RELEASE_TABLET | Freq: Two times a day (BID) | ORAL | Status: DC
  Administered 2020-06-02 – 2020-06-10 (×16): 40 mg via ORAL
  Filled 2020-06-02 (×16): qty 1

## 2020-06-02 MED ORDER — INSULIN ASPART 100 UNIT/ML IJ SOLN
6.0000 [IU] | Freq: Three times a day (TID) | INTRAMUSCULAR | Status: DC
  Administered 2020-06-02 – 2020-06-09 (×22): 6 [IU] via SUBCUTANEOUS

## 2020-06-02 MED ORDER — ASPIRIN 81 MG PO CHEW
81.0000 mg | CHEWABLE_TABLET | Freq: Every day | ORAL | Status: DC
  Administered 2020-06-03 – 2020-06-10 (×8): 81 mg via ORAL
  Filled 2020-06-02 (×8): qty 1

## 2020-06-02 MED ORDER — TRAZODONE HCL 50 MG PO TABS
25.0000 mg | ORAL_TABLET | Freq: Every evening | ORAL | Status: DC | PRN
  Administered 2020-06-04: 50 mg via ORAL
  Filled 2020-06-02: qty 1

## 2020-06-02 MED ORDER — INSULIN DETEMIR 100 UNIT/ML ~~LOC~~ SOLN: 38.0000 [IU] | Freq: Two times a day (BID) | SUBCUTANEOUS | 11 refills | Status: DC

## 2020-06-02 MED ORDER — DOCUSATE SODIUM 100 MG PO CAPS
100.0000 mg | ORAL_CAPSULE | Freq: Two times a day (BID) | ORAL | Status: DC
  Administered 2020-06-02 – 2020-06-10 (×16): 100 mg via ORAL
  Filled 2020-06-02 (×16): qty 1

## 2020-06-02 MED ORDER — ADULT MULTIVITAMIN W/MINERALS CH
1.0000 | ORAL_TABLET | Freq: Every day | ORAL | Status: DC
  Administered 2020-06-03 – 2020-06-10 (×8): 1 via ORAL
  Filled 2020-06-02 (×8): qty 1

## 2020-06-02 MED ORDER — FUROSEMIDE 40 MG PO TABS: 40.0000 mg | ORAL_TABLET | Freq: Every day | ORAL | Status: DC

## 2020-06-02 MED ORDER — CARVEDILOL 3.125 MG PO TABS: 3.1250 mg | ORAL_TABLET | Freq: Two times a day (BID) | ORAL | Status: DC

## 2020-06-02 NOTE — PMR Pre-admission (Signed)
PMR Admission Coordinator Pre-Admission Assessment  Patient: [name] is an 65 y.o., male MRN: [id number] DOB: 02-Nov-1955 Height: 5' 10.98" (180.3 cm) Weight: 127.4 kg (scale c)  Insurance Information HMO:     PPO:      PCP:      IPA:      80/20:      OTHER:  PRIMARY: Medicare A/B      Policy#: [id number]      Subscriber: pt CM Name:       Phone#:      Fax#:  Pre-Cert#: verified Civil engineer, contracting:  Benefits:  Phone #:      Name:  Eff. Date: A 06/03/96, B 07/04/11     Deduct: $1556      Out of Pocket Max: n/a      Life Max: n/a CIR: 100%      SNF: 20 full days Outpatient: 80%     Co-Pay: 20% Home Health: 100%      Co-Pay:  DME: 80%     Co-Pay: 20% Providers: pt choice   SECONDARY: [facility]      Policy#: [id number]     Phone#: [phone]  Financial Counselor:       Phone#:   The "Data Collection Information Summary" for patients in Inpatient Rehabilitation Facilities with attached "Privacy Act [place] Records" was provided and verbally reviewed with: Patient and Family  Emergency Contact Information Contact Information    Name Relation Home Work [place] Spouse [phone]  [phone]      Current Medical History  Patient Admitting Diagnosis: cardiac arrest  History of Present Illness: Pt is a 65 y/o male with PMH of HTN, DM, chronic pain, R BBB, and L THA admitted to [facility] on 5/17 from outside hospital with witness cardiac arrest with immediate initiation of CPR by patient's wife and EMS shortly after.  Reportedly shocked multiple times by EMS.  CPR 49 minutes before ROSC.  Underlying rhythm VFib.  Post arrest EKG suggested possibility of interior MI.  Portable bedside echo showed inferobasal severe hypokinesis with septal bounce.  Systolic function negative, LVEF 40-50%. 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  LM, LAD and LCx all patent and no indication for PCI.  Pt had cardiogenic shock, requiring multiple pressors, electrolyte support,  and steroids.  Pt was intubated 5/17 through 5/22.  Abnormal LFTs and AKI felt to be 2/2 to prolonged arrest.  Briefly on lasix drip for diuresis.  On 5/23 developed complete heart block with prolonged V pause (7 seconds), amio stopped and dobutamine resumed.  Pt underwent implantation of dual chamber ICD on 5/26.  Therapy ongoing and pt was recommended for CIR.      Patient's medical record from [facility] has been reviewed by the rehabilitation admission coordinator and physician.  Past Medical History  Past Medical History:  Diagnosis Date  . Adenomatous polyp   . Cataract    left eye for sure   . Chronic pain   . Diabetes mellitus   . GERD (gastroesophageal reflux disease)   . History of colonic polyps   . History of hip replacement, total   . Hx of anaphylactic shock   . Hyperlipidemia   . Hypertension   . Left anterior hemiblock   . Obesity   . RBBB (right bundle branch block with left anterior fascicular block)   . 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  Prior Rehab/Hospitalizations Has the patient had prior rehab or hospitalizations prior to admission? No  Has the patient had major surgery during 100 days prior to admission? Yes   Current Medications  Current Facility-Administered Medications:  .  0.9 %  sodium chloride infusion, 250 mL, Intravenous, PRN, Vickie Epley, MD, Stopped at 05/25/20 1831 .  0.9 %  sodium chloride infusion, , Intravenous, Continuous, Vickie Epley, MD, Stopped at 05/28/20 1200 .  acetaminophen (TYLENOL) tablet 650 mg, 650 mg, Oral, Q4H PRN, Vickie Epley, MD, 650 mg at 05/31/20 1704 .  aspirin chewable tablet 81 mg, 81 mg, Oral, Daily, Vickie Epley, MD, 81 mg at 06/02/20 0806 .  atorvastatin (LIPITOR) tablet 80 mg, 80 mg, Oral, Daily, Vickie Epley, MD, 80 mg at 06/02/20 0805 .  carvedilol (COREG) tablet 3.125 mg, 3.125 mg, Oral, BID WC, Larey Dresser, MD, 3.125 mg at 06/02/20 0806 .  chlorhexidine gluconate (MEDLINE KIT) (PERIDEX) 0.12 % solution 15 mL, 15 mL, Mouth Rinse, BID, Vickie Epley, MD, 15 mL at 06/01/20 1003 .  Chlorhexidine Gluconate Cloth 2 % PADS 6 each, 6 each, Topical, Daily, Vickie Epley, MD, 6 each at 06/01/20 1632 .  clopidogrel (PLAVIX) tablet 75 mg, 75 mg, Oral, Daily, Clegg, Amy D, NP, 75 mg at 06/02/20 0806 .  cyclobenzaprine (FLEXERIL) tablet 5 mg, 5 mg, Oral, TID, Vickie Epley, MD, 5 mg at 06/02/20 0805 .  diclofenac Sodium (VOLTAREN) 1 % topical gel 4 g, 4 g, Topical, TID PRN, Vickie Epley, MD, 4 g at 05/29/20 2310 .  docusate sodium (COLACE) capsule 100 mg, 100 mg, Oral, BID, Vickie Epley, MD, 100 mg at 06/02/20 0805 .  gabapentin (NEURONTIN) capsule 200 mg, 200 mg, Oral, BID, Vickie Epley, MD, 200 mg at 06/02/20 0805 .  insulin aspart (novoLOG) injection 0-15 Units, 0-15 Units, Subcutaneous, TID WC, Nita Sells, MD, 3 Units at 06/02/20 0630 .  insulin aspart (novoLOG) injection 6 Units, 6 Units, Subcutaneous, TID WC, Nita Sells, MD, 6 Units at 06/02/20 0806 .  insulin detemir (LEVEMIR) injection 38 Units, 38 Units, Subcutaneous, BID, Nita Sells, MD, 38 Units at 06/02/20 0920 .  ketotifen (ZADITOR) 0.025 % ophthalmic solution 1 drop, 1 drop, Both Eyes, TID PRN, Vickie Epley, MD, 1 drop at 05/25/20 2020 .  lip balm (BLISTEX) ointment, , Topical, PRN, Vickie Epley, MD .  MEDLINE mouth rinse, 15 mL, Mouth Rinse, BID, Vickie Epley, MD, 15 mL at 06/02/20 0807 .  multivitamin with minerals tablet 1 tablet, 1 tablet, Oral, Daily, Vickie Epley, MD, 1 tablet at 06/02/20 0805 .  nitroGLYCERIN (NITROSTAT) SL tablet 0.4 mg, 0.4 mg, Sublingual, Q5 Min x 3 PRN, Vickie Epley, MD .  ondansetron [facility]) injection 4 mg, 4 mg,  Intravenous, Q6H PRN, Vickie Epley, MD, 4 mg at 05/28/20 1123 .  oxyCODONE (Oxy IR/ROXICODONE) immediate release tablet 5-10 mg, 5-10 mg, Oral, Q4H PRN, Vickie Epley, MD, 10 mg at 06/02/20 3818 .  pantoprazole (PROTONIX) EC tablet 40 mg, 40 mg, Oral, BID AC, Vickie Epley, MD, 40 mg at 06/02/20 2993 .  polyvinyl alcohol (LIQUIFILM TEARS) 1.4 % ophthalmic solution 1 drop, 1 drop, Both Eyes, PRN, Vickie Epley, MD, 1 drop at 05/26/20 0146 .  potassium chloride SA (KLOR-CON) CR tablet 20  mEq, 20 mEq, Oral, Daily, Vickie Epley, MD, 20 mEq at 06/02/20 0805 .  sodium chloride flush (NS) 0.9 % injection 10-40 mL, 10-40 mL, Intracatheter, PRN, Samtani, Jai-Gurmukh, MD .  sodium chloride flush (NS) 0.9 % injection 3 mL, 3 mL, Intravenous, Q12H, Vickie Epley, MD, 3 mL at 05/27/20 2305 .  sodium chloride flush (NS) 0.9 % injection 3 mL, 3 mL, Intravenous, PRN, Lars Mage T, MD .  sodium chloride flush (NS) 0.9 % injection 3 mL, 3 mL, Intravenous, Q12H, Vickie Epley, MD, 3 mL at 05/29/20 0005 .  sodium chloride flush (NS) 0.9 % injection 3 mL, 3 mL, Intravenous, PRN, Lars Mage T, MD .  sodium chloride flush (NS) 0.9 % injection 3 mL, 3 mL, Intravenous, Q12H, Vickie Epley, MD, 3 mL at 06/02/20 0808  Patients Current Diet:  Diet Order            Diet - low sodium heart healthy           Diet Heart Room service appropriate? Yes; Fluid consistency: Thin  Diet effective now                 Precautions / Restrictions Precautions Precautions: Fall,ICD/Pacemaker Precaution Comments: flow track, art line Restrictions Weight Bearing Restrictions: Yes Other Position/Activity Restrictions: pacemaker precautions   Has the patient had 2 or more falls or a fall with injury in the past year? 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 Independent  Indoor Mobility: Did the patient need assistance with walking from room to room (with or without device)? Independent  Stairs: Did the patient need assistance with internal or external stairs (with or without device)? Independent  Functional Cognition: Did the patient need help planning regular tasks such as shopping or remembering to take medications? Independent  Home Assistive Devices / Equipment Home Assistive Devices/Equipment: None Home Equipment: Walker - 2 wheels,Bedside commode  Prior Device Use: Indicate devices/aids used by the patient prior to current illness, exacerbation or injury? None of the above  Current Functional Level Cognition  Overall Cognitive Status: Within Functional Limits for tasks assessed Orientation Level: Oriented X4 General Comments: reports feeling sleepy and tired today. "the doctors told me I could take it easy today."    Extremity Assessment (includes Sensation/Coordination)  Upper Extremity Assessment: Generalized weakness  Lower Extremity Assessment: Defer to PT evaluation    ADLs  Overall ADL's : Needs assistance/impaired Eating/Feeding Details (indicate cue type and reason): NPO but with strength and AROM WNL for self feeding Grooming: Wash/dry hands,Wash/dry face,Set up,Bed level Upper Body Bathing: Minimal assistance,Bed level Lower Body Bathing: Maximal assistance,Bed level Upper Body Dressing : Minimal assistance,Bed level Upper Body Dressing Details (indicate cue type and reason): education provided on compensatory methods for UB dressing to pacer precautions Lower Body Dressing: Total assistance,Bed level Toilet Transfer: Min guard,Minimal assistance,RW,Ambulation,+2 for safety/equipment Toilet Transfer Details (indicate cue type and reason): simulated via functional mobility with RW and +2 assist with chair follow Toileting- Clothing Manipulation and Hygiene: Total assistance,Bed  level Functional mobility during ADLs: Min guard,Minimal assistance,+2 for safety/equipment,Rolling walker General ADL Comments: education provide ADL participation with ICD precautions    Mobility  Overal bed mobility: Needs Assistance Bed Mobility: Supine to Sit Rolling: Mod assist Supine to sit: HOB elevated,Mod assist Sit to supine: Mod assist,+2 for physical assistance General bed  mobility comments: pt OOB in recliner upon arrival and returned pt to recliner at end of session    Transfers  Overall transfer level: Needs assistance Equipment used: Rolling walker (2 wheeled) Transfers: Sit to/from Stand Sit to Stand: Min guard,+2 safety/equipment Stand pivot transfers: Min assist General transfer comment: cues for hand placement for sit<>stand, min guard for safety to rise from recliner, vc's to not put significant wt through LUE    Ambulation / Gait / Stairs / Wheelchair Mobility  Ambulation/Gait Ambulation/Gait assistance: Min assist,+2 safety/equipment Gait Distance (Feet): 80 Feet Assistive device: Rolling walker (2 wheeled) Gait Pattern/deviations: Wide base of support General Gait Details: needed 3 standing rest breaks. Chair brought behind as pt was unsteady and knees buckling last 5'. Pt needed cues to self monitor Gait velocity: decreased Gait velocity interpretation: <1.31 ft/sec, indicative of household ambulator    Posture / Balance Dynamic Sitting Balance Sitting balance - Comments: supervision for safety. Balance Overall balance assessment: Needs assistance Sitting-balance support: Feet unsupported,No upper extremity supported Sitting balance-Leahy Scale: Fair Sitting balance - Comments: supervision for safety. Standing balance support: During functional activity Standing balance-Leahy Scale: Poor Standing balance comment: requries BUE support    Special needs/care consideration Diabetic management yes   Previous Home Environment (from acute therapy  documentation) Living Arrangements: Spouse/significant other Available Help at Discharge: Family,Available 24 hours/day Type of Home: House Home Layout: One level Home Access: Stairs to enter [employer] of Steps: 1 Bathroom Shower/Tub: Chiropodist: [place]: No  Discharge Living Setting Plans for Discharge Living Setting: Patient's home,Lives with (comment) (wife) Type of Home at Discharge: House Discharge Home Layout: One level Discharge Home Access: Stairs to enter Entrance Stairs-Rails: None Entrance Stairs-Number of Steps: 1 Discharge Bathroom Shower/Tub: Tub/shower unit Discharge Bathroom Toilet: Standard Discharge Bathroom Accessibility: Yes How Accessible: Accessible via walker Does the patient have any problems obtaining your medications?: No  Social/Family/Support Systems Patient Roles: Spouse Anticipated Caregiver: Arville Go (spouse) Anticipated Caregiver's Contact Information: [phone] Ability/Limitations of Caregiver: min assist Caregiver Availability: 24/7 Discharge Plan Discussed with Primary Caregiver: Yes Is Caregiver In Agreement with Plan?: Yes Does Caregiver/Family have Issues with Lodging/Transportation while Pt is in Rehab?: No  Goals Patient/Family Goal for Rehab: PT/OT supervision to mod I, SLP n/a Expected length of stay: 9-12 days Pt/Family Agrees to Admission and willing to participate: Yes Program Orientation Provided & Reviewed with Pt/Caregiver Including Roles  & Responsibilities: Yes  Decrease burden of Care through IP rehab admission: n/a  Possible need for SNF placement upon discharge: not anticipated  Patient Condition: I have reviewed medical records from [facility], spoken with CM, and patient and spouse. I met with patient at the bedside for inpatient rehabilitation assessment.  Patient will benefit from ongoing PT and OT, can actively participate in 3 hours of therapy a day 5  days of the week, and can make measurable gains during the admission.  Patient will also benefit from the coordinated team approach during an Inpatient Acute Rehabilitation admission.  The patient will receive intensive therapy as well as Rehabilitation physician, nursing, social worker, and care management interventions.  Due to safety, disease management, medication administration, pain management and patient education the patient requires 24 hour a day rehabilitation nursing.  The patient is currently min assist with mobility and basic ADLs.  Discharge setting and therapy post discharge at home with home health is anticipated.  Patient has agreed to participate in the Acute Inpatient Rehabilitation Program and will admit today.  Preadmission Screen Completed By:  Michel Santee, PT, DPT 06/02/2020 10:34 AM ______________________________________________________________________   Discussed status with Dr. Naaman Plummer on 06/02/20  at 10:59 AM  and received approval for admission today.  Admission Coordinator:  Michel Santee, PT, DPT time 10:59 AM Sudie Grumbling 06/02/20    Assessment/Plan: Diagnosis:debility after cardiac arrest 1. Does the need for close, 24 hr/day Medical supervision in concert with the patient's rehab needs make it unreasonable for this patient to be served in a less intensive setting? Yes 2. Co-Morbidities requiring supervision/potential complications: HTN, DM 3. Due to bladder management, bowel management, safety, skin/wound care, disease management, medication administration, pain management and patient education, does the patient require 24 hr/day rehab nursing? Yes 4. Does the patient require coordinated care of a physician, rehab nurse, PT, OT, and SLP to address physical and functional deficits in the context of the above medical diagnosis(es)? Yes Addressing deficits in the following areas: balance, endurance, locomotion, strength, transferring, bowel/bladder control, bathing,  dressing, feeding, grooming, toileting and psychosocial support 5. Can the patient actively participate in an intensive therapy program of at least 3 hrs of therapy 5 days a week? Yes 6. The potential for patient to make measurable gains while on inpatient rehab is excellent 7. Anticipated functional outcomes upon discharge from inpatient rehab: modified independent and supervision PT, modified independent and supervision OT, n/a SLP 8. Estimated rehab length of stay to reach the above functional goals is: 9-12 days 9. Anticipated discharge destination: Home 10. Overall Rehab/Functional Prognosis: excellent   MD Signature: Meredith Staggers, MD, [facility] 06/02/2020

## 2020-06-02 NOTE — Discharge Summary (Signed)
Physician Discharge Summary  [name] ZOX:[id number] DOB: 08-22-1955 DOA: 05/19/2020  PCP: [facility]  Admit date: 05/19/2020 Discharge date: 06/02/2020  Time spent: 47 minutes  Recommendations for Outpatient Follow-up:  1. See list of medications and heart failure notes separately 2. Please ensure prior to discharge from inpatient rehab has a working CPAP machine as he will need this on discharge 3. Recommend Chem-12, CBC 3 days from now 4. Recommend daily weights to check on heart failure 5. Note dosage medications adjustments in addition to new meds started  Discharge Diagnoses:  MAIN problem for hospitalization   STEMI/VF arrest on admission   Please see below for itemized issues addressed in [place]- refer to other progress notes for clarity if needed  Discharge Condition: Improved  Diet recommendation: Diabetic heart healthy fluid restricted to 1200 to 1500 cc  Filed Weights   05/31/20 0500 06/01/20 0554 06/02/20 0553  Weight: 126.6 kg 127.1 kg 127.4 kg    History of present illness:  65 year old white male community dwelling Inferior wall STEMI VF arrest 05/19/2020 with 45-50 minutes CPR subsequent hypotension ST elevation inferior leads 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 S/p VF arrest with 45 mins CPR before ROSC. Left femoral line inserted. C/f inferior STEMI. 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 LA and troponins downtrending. Weaning Giapreza. SVR and CO remain suboptimal  5/19 Weaning DBA, Giapreza. Starting lasix gtt. Able to follow commands. Had an episode of VF, self-aborted. Was given additional amiodarone. Off Giapressa  5/22 Weaning off pressors, extubated  5/23 Episodic 3rd deg heart block-amiodarone stopped and has resolved  LVEF 45%--> repeat  echo 5/26 = 60-65% no regional wall motion abnormalities   Hospital Course:  Hypovolemic distributive shock-pressors off, physiology resolved Current meds : Lasix transition to Jardiance 10 daily, Coreg 3.125 twice daily, atorvastatin 80 Nitro-Dur tablets, aspirin 81, Plavix 75 Down 8.7 liters since admit VF arrest status post CPR 45 minutes episodic third-degree heart block while on amiodarone Dual-chamber ICD implant 5/27 (secondary prevention) Dr. Theda Belfast driving X 6 months Resumed plavix 5/30 Will need outpatient follow-up with cardiology He will get a limited amount of opiates on discharge because of his arm pain OSA not compliant on CPAP Possible substrate for arrhythmias? TOC to look into ensuring he has machine that is working properly--consult placed 5/30-if this is not done this hospitalization will need to occur at [facility] to ensure that he is able to get his CPAP machine-this is a higher risk factor for further arrhythmias DM TY 2 Continue current Levemir 38 twice daily (home meds 80 at bedtime) and resistant scale Insulin dosing adjusted--will continue to adjust Jardiance resumed per cardiology for heart failure issues Resume slowly gabapentin at lower dose 200 4 times daily HTN Holding Prinzide 1 daily--other maeds as above ATN on admission Secondary to shock physiology which is now resolved Creatinine improving BMI 40  per nutritionist-patient interested in weight loss OP GOP? Transaminitis mild Recheck LFTs in several days   Discharge Exam: Vitals:   06/02/20 0553 06/02/20 0756  BP: 125/66 124/60  Pulse: 80 85  Resp: 17 19  Temp: 97.9 F (36.6 C) 98.2 F (36.8 C)  SpO2: 98% 96%    Subj on day of d/c   Awake coherent not on oxygen looks well tolerating diet still has some left arm  pain no fever no chills  General Exam on discharge  EOMI NCAT no focal deficit left arm dressing has been removed from pacemaker He is able to move it somewhat well He has  no fever no chills Lower extremity edema is improved He is moving all 4 limbs equally Neurologically is intact but limited in his left upper  Discharge Instructions   Discharge Instructions    Amb Referral to Cardiac Rehabilitation   Complete by: As directed    Diagnosis: STEMI   After initial evaluation and assessments completed: Virtual Based Care may be provided alone or in conjunction with Phase 2 Cardiac Rehab based on patient barriers.: Yes   Amb Referral to Nutrition and Diabetic Education   Complete by: As directed    Diet - low sodium heart healthy   Complete by: As directed    Increase activity slowly   Complete by: As directed    No wound care   Complete by: As directed      Allergies as of 06/02/2020      Reactions   Toradol [ketorolac Tromethamine] Anaphylaxis   Codeine    REACTION: itch   Morphine And Related Nausea And Vomiting   Naproxen    REACTION: anaphylaxis      Medication List    STOP taking these medications   amoxicillin-clavulanate 875-125 MG tablet Commonly known as: AUGMENTIN   Insulin Aspart FlexPen 100 UNIT/ML Sopn Replaced by: insulin aspart 100 UNIT/ML injection   Insulin Pen Needle 32G X 5 MM Misc Commonly known as: NovoTwist   lisinopril-hydrochlorothiazide 20-25 MG tablet Commonly known as: ZESTORETIC   ONE TOUCH ULTRA SYSTEM KIT w/Device Kit   ONE TOUCH ULTRA TEST test strip Generic drug: glucose blood     TAKE these medications   aspirin EC 81 MG tablet Take 81 mg by mouth daily. Swallow whole.   atorvastatin 40 MG tablet Commonly known as: LIPITOR Take 1 tablet (40 mg total) by mouth daily.   carboxymethylcellulose 0.5 % Soln Commonly known as: REFRESH PLUS Place 1 drop into both eyes 3 (three) times daily as needed (dry eyes).   carvedilol 3.125 MG tablet Commonly known as: COREG Take 1 tablet (3.125 mg total) by mouth 2 (two) times daily with a meal.   clopidogrel 75 MG tablet Commonly known as: PLAVIX Take  1 tablet (75 mg total) by mouth daily. Start taking on: June 03, 2020   cyclobenzaprine 5 MG tablet Commonly known as: FLEXERIL Take 1 tablet (5 mg total) by mouth 3 (three) times daily. What changed:   medication strength  how much to take   diclofenac sodium 1 % Gel Commonly known as: VOLTAREN Apply 4 g topically 3 (three) times daily as needed (pain).   empagliflozin 25 MG Tabs tablet Commonly known as: JARDIANCE Take 12.5 mg by mouth daily.   Fish Oil 1000 MG Caps Take 2,000 mg by mouth daily.   gabapentin 100 MG capsule Commonly known as: NEURONTIN Take 2 capsules (200 mg total) by mouth 2 (two) times daily. What changed:   medication strength  how much to take  when to take this   insulin aspart 100 UNIT/ML injection Commonly known as: novoLOG Inject 0-15 Units into the skin 3 (three) times daily with meals. Replaces: Insulin Aspart FlexPen 100 UNIT/ML Sopn   insulin detemir 100 UNIT/ML injection Commonly known as: LEVEMIR Inject 0.38 mLs (38 Units total) into the skin 2 (two) times daily. What changed:   how much to take  how to take this  when to take this  additional instructions   lidocaine 5 % Commonly known as: LIDODERM Place 1 patch onto the skin daily as needed (pain).   loratadine 10 MG tablet Commonly known as: CLARITIN Take 10 mg by mouth daily.   multivitamin with minerals tablet Take 1 tablet by mouth daily.   oxyCODONE 5 MG immediate release tablet Commonly known as: Oxy IR/ROXICODONE Take 1-2 tablets (5-10 mg total) by mouth every 4 (four) hours as needed for moderate pain.   pantoprazole 40 MG tablet Commonly known as: PROTONIX Take 40 mg by mouth 2 (two) times daily before a meal.      Allergies  Allergen Reactions  . Toradol [Ketorolac Tromethamine] Anaphylaxis  . Codeine     REACTION: itch  . Morphine And Related Nausea And Vomiting  . Naproxen     REACTION: anaphylaxis    Follow-up Information    [facility] Follow up.   Specialty: Cardiology Why: 06/11/20 @ 12;00PM (noon), wound check visit Contact information: [address], [address] [place]       Vickie Epley, MD Follow up.   Specialties: Cardiology, Radiology Why: 09/01/20 @ 3:15PM Contact information: [address] [place] [address] [phone]        [facility] Follow up.   Specialty: Cardiology Why: June 26, 2020 at 10:00 AM [facility] Code 4233 Contact information: [address] [id number] [place] [phone]               The results of significant diagnostics from this hospitalization (including imaging, microbiology, ancillary and laboratory) are listed below for reference.    Significant Diagnostic Studies: DG Chest 2 View  Result Date: 05/29/2020 CLINICAL DATA:  ICD EXAM: CHEST - 2 VIEW COMPARISON:  05/24/2020 FINDINGS: Interval removal of endotracheal and nasogastric tubes. New LEFT subclavian ICD with leads projecting at RIGHT atrium and RIGHT ventricle. Normal heart size and mediastinal contours. Improved pulmonary edema. No pleural effusion or pneumothorax. Advanced degenerative changes LEFT glenohumeral joint. IMPRESSION: No pneumothorax following ICD placement. Improved pulmonary edema. Electronically Signed   By: Lavonia Dana M.D.   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  Widely patent left main  Widely patent LAD  Widely patent circumflex  LVEDP 14 initially and following angiography and IV fluid 20 mmHg RECOMMENDATIONS:  IV dobutamine to improve  potential RV dysfunction.  IV fluids as tolerated  A central line will be placed in the unit to measure co-oximetry and get right heart filling pressures.  Continue Levophed, epinephrine, IV fentanyl, and intermittent Versed as needed.  Wean Levophed and epinephrine as tolerated.  Advanced heart failure team will help manage.  Critical care team, Dr. Doyne Keel managing primarily.  DG CHEST PORT 1 VIEW  Result Date: 05/24/2020 CLINICAL DATA:  Shortness of breath and hypotension. EXAM: PORTABLE CHEST 1 VIEW COMPARISON:  05/23/2020 FINDINGS: Cardiomegaly, pulmonary vascular congestion and mild interstitial again noted. An endotracheal tube with tip 6.2 cm above the carina, NG/OG tube entering the stomach, and RIGHT subclavian central venous catheter with tip overlying the LOWER SVC again noted. There is no  evidence of pneumothorax. Mild bibasilar atelectasis is again noted. Little interval changes noted from the prior study. IMPRESSION: Unchanged appearance of the chest with cardiomegaly, pulmonary vascular congestion, probable mild interstitial edema and bibasilar atelectasis. Electronically Signed   By: Margarette Canada M.D.   On: 05/24/2020 12:04   DG CHEST PORT 1 VIEW  Result Date: 05/23/2020 CLINICAL DATA:  Respirator dependent, evaluate tube placement. EXAM: PORTABLE CHEST 1 VIEW COMPARISON:  May 21, 2020. FINDINGS: Endotracheal tube approximately 8.3 cm above the carina, tip between clavicular heads. Previously 6.6 cm. Unclear but difference may be projectional. Gastric tube coursing through in off the field of the radiograph. RIGHT-sided subclavian central venous line terminates at the caval to atrial junction as before. Cardiomediastinal contours are stable. Hilar structures grossly normal. Persistent retrocardiac opacity obscures the LEFT hemidiaphragm. Image rotated to the LEFT limiting assessment of the chest. The presumed LEFT effusion unchanged. Patchy airspace disease at the RIGHT lung base has  improved. EKG leads project over the chest. On limited assessment no acute skeletal process. IMPRESSION: 1. Endotracheal tube approximately 8.3 cm above the carina, tip between the clavicular heads. Unclear difference but difference may be projectional. Could consider slight advancement as warranted though tubew position is within 1 cm of its position on May 19, 2020. 2. Gastric tube coursing through in off the field of the radiograph. 3. Persistent retrocardiac opacity and presumed LEFT effusion. 4. Improved aeration at the RIGHT lung base. Electronically Signed   By: Zetta Bills M.D.   On: 05/23/2020 11:56   DG CHEST PORT 1 VIEW  Result Date: 05/21/2020 CLINICAL DATA:  Intubation.  Cardiac arrest. EXAM: PORTABLE CHEST 1 VIEW COMPARISON:  05/19/2020. FINDINGS: Endotracheal tube, NG tube, right central line in stable position. Heart size stable. Low lung volumes. Diffuse bilateral pulmonary infiltrates/edema again noted. Interim improvement from prior exam. Small bilateral pleural effusions may be present. No pneumothorax. IMPRESSION: 1.  Lines and tubes in stable position. 2. Low lung volumes. Diffuse bilateral pulmonary infiltrates/edema again noted. Interim improvement from prior exam. Small bilateral pleural effusions may be present. Electronically Signed   By: Marcello Moores  Register   On: 05/21/2020 06:24   DG CHEST PORT 1 VIEW  Result Date: 05/19/2020 CLINICAL DATA:  Check central line placement EXAM: PORTABLE CHEST 1 VIEW COMPARISON:  Film from earlier in the same day. FINDINGS: Endotracheal tube is noted in satisfactory position. Gastric catheter is noted coiled within the stomach. Right subclavian central line is noted with catheter tip in the mid superior vena cava. No pneumothorax is noted. Persistent airspace opacities are noted primarily in the upper lobes right worse than left. No other focal abnormality is noted. IMPRESSION: Stable airspace opacities. Tubes and lines as described above without  complicating factors. Electronically Signed   By: Inez Catalina M.D.   On: 05/19/2020 23:44   DG Chest Port 1 View  Result Date: 05/19/2020 CLINICAL DATA:  Check endotracheal tube placement, STEMI EXAM: PORTABLE CHEST 1 VIEW COMPARISON:  Film from earlier in the same day. FINDINGS: Cardiac shadow is stable. Diffuse airspace opacity is noted bilaterally but slightly improved when compared with the prior exam likely representing edema given the clinical history. Endotracheal tube is again noted 7.9 cm above the carina. No other focal abnormality is noted. IMPRESSION: Endotracheal tube as described. Slight improved aeration is noted. Electronically Signed   By: Inez Catalina M.D.   On: 05/19/2020 18:58   ECHOCARDIOGRAM COMPLETE  Result Date: 05/20/2020    ECHOCARDIOGRAM REPORT  Patient Name:   [name] Date of Exam: 05/20/2020 Medical Rec #:  [id number]       Height:       71.0 in Accession #:    [id number]      Weight:       309.7 lb Date of Birth:  03/25/1955       BSA:          2.539 m Patient Age:    11 years        BP:           114/61 mmHg Patient Gender: M               HR:           120 bpm. Exam Location:  Inpatient Procedure: 2D Echo, Cardiac Doppler, Color Doppler and Intracardiac            Opacification Agent Indications:    Cardiac arrest; R00.9* Unspecified abnormalities of heart beat  History:        Patient has no prior history of Echocardiogram examinations.                 Arrythmias:Cardiac Arrest and Ventricular Fibrillation,                 Signs/Symptoms:Dyspnea and Shortness of Breath; Risk                 Factors:Hypertension, Diabetes and Dyslipidemia. Hypoxia.  Sonographer:    Roseanna Rainbow RDCS Referring Phys: [id number] RHONDA G BARRETT  Sonographer Comments: Technically difficult study due to poor echo windows, patient is morbidly obese and echo performed with patient supine and on artificial respirator. Image acquisition challenging due to patient body habitus. Dr. Aundra Dubin present for  first set of images. IMPRESSIONS  1. Left ventricular ejection fraction, by estimation, is 40 to 45%. The left ventricle has mildly decreased function. The left ventricle demonstrates regional wall motion abnormalities (basal and mid inferolateral suggestive of RCA disease). There is mild concentric left ventricular hypertrophy. Left ventricular diastolic parameters are indeterminate.  2. Right ventricular systolic function is hyperdynamic. The right ventricular size is normal.  3. A small pericardial effusion is present.  4. The mitral valve is grossly normal. No evidence of mitral valve regurgitation.  5. The aortic valve was not well visualized. Aortic valve regurgitation is not visualized.  6. Aortic dilatation noted. There is borderline dilatation of the aortic root, measuring 38 mm.  7. The inferior vena cava is dilated in size with <50% respiratory variability, suggesting right atrial pressure of 15 mmHg. Comparison(s): No prior Echocardiogram. Conclusion(s)/Recommendation(s): AHF team aware. FINDINGS  Left Ventricle: Left ventricular ejection fraction, by estimation, is 40 to 45%. The left ventricle has mildly decreased function. The left ventricle demonstrates regional wall motion abnormalities. Definity contrast agent was given IV to delineate the left ventricular endocardial borders. The left ventricular internal cavity size was normal in size. There is mild concentric left ventricular hypertrophy. Left ventricular diastolic parameters are indeterminate.  LV Wall Scoring: The posterior wall is hypokinetic. Right Ventricle: The right ventricular size is normal. No increase in right ventricular wall thickness. Right ventricular systolic function is hyperdynamic. Left Atrium: Left atrial size was normal in size. Right Atrium: Right atrial size was normal in size. Pericardium: A small pericardial effusion is present. Mitral Valve: The mitral valve is grossly normal. No evidence of mitral valve regurgitation.  Tricuspid Valve: The tricuspid valve is grossly normal. Tricuspid valve regurgitation is not demonstrated.  Aortic Valve: The aortic valve was not well visualized. Aortic valve regurgitation is not visualized. Pulmonic Valve: The pulmonic valve was not well visualized. Pulmonic valve regurgitation is not visualized. No evidence of pulmonic stenosis. Aorta: Aortic dilatation noted. There is borderline dilatation of the aortic root, measuring 38 mm. Venous: The inferior vena cava is dilated in size with less than 50% respiratory variability, suggesting right atrial pressure of 15 mmHg. IAS/Shunts: The atrial septum is grossly normal.  LEFT VENTRICLE PLAX 2D LVIDd:         5.40 cm  Diastology LVIDs:         4.30 cm  LV e' medial:    6.85 cm/s LV PW:         1.10 cm  LV E/e' medial:  11.6 LV IVS:        1.70 cm  LV e' lateral:   5.87 cm/s LVOT diam:     2.60 cm  LV E/e' lateral: 13.6 LV SV:         91 LV SV Index:   36 LVOT Area:     5.31 cm  RIGHT VENTRICLE             IVC RV S prime:     15.00 cm/s  IVC diam: 2.30 cm TAPSE (M-mode): 2.4 cm LEFT ATRIUM           Index       RIGHT ATRIUM           Index LA diam:      2.10 cm 0.83 cm/m  RA Area:     13.00 cm LA Vol (A2C): 23.4 ml 9.21 ml/m  RA Volume:   28.70 ml  11.30 ml/m LA Vol (A4C): 47.5 ml 18.71 ml/m  AORTIC VALVE LVOT Vmax:   106.00 cm/s LVOT Vmean:  83.100 cm/s LVOT VTI:    0.172 m  AORTA Ao Root diam: 3.50 cm Ao Asc diam:  4.00 cm MITRAL VALVE MV Area (PHT): 5.84 cm    SHUNTS MV Decel Time: 130 msec    Systemic VTI:  0.17 m MV E velocity: 79.60 cm/s  Systemic Diam: 2.60 cm MV A velocity: 92.90 cm/s MV E/A ratio:  0.86 Rudean Haskell MD Electronically signed by Rudean Haskell MD Signature Date/Time: 05/20/2020/3:11:43 PM    Final    ECHOCARDIOGRAM LIMITED  Result Date: 05/28/2020    ECHOCARDIOGRAM LIMITED REPORT   Patient Name:   [name] [place] Date of Exam: 05/28/2020 Medical Rec #:  [id number]       Height:       71.0 in Accession #:     [id number]      Weight:       291.9 lb Date of Birth:  06-17-1955       BSA:          2.476 m Patient Age:    73 years        BP:           147/61 mmHg Patient Gender: M               HR:           80 bpm. Exam Location:  Inpatient Procedure: 2D Echo, Cardiac Doppler and Color Doppler Indications:    Cardiomyopathy  History:        Patient has prior history of Echocardiogram examinations, most                 recent 05/20/2020. Risk Factors:Hypertension  and Diabetes.  Sonographer:    Cammy Brochure Referring Phys: [id number] Vickie Epley  Sonographer Comments: Suboptimal apical window. Image acquisition challenging due to patient body habitus. IMPRESSIONS  1. Limited echo shows normal LVEF with no WMA. Normal RV function/size.  2. Left ventricular ejection fraction, by estimation, is 60 to 65%. The left ventricle has normal function. The left ventricle has no regional wall motion abnormalities.  3. Right ventricular systolic function is normal. The right ventricular size is normal. FINDINGS  Left Ventricle: Left ventricular ejection fraction, by estimation, is 60 to 65%. The left ventricle has normal function. The left ventricle has no regional wall motion abnormalities. Definity contrast agent was given IV to delineate the left ventricular  endocardial borders. Right Ventricle: The right ventricular size is normal. No increase in right ventricular wall thickness. Right ventricular systolic function is normal. Left Atrium: Left atrial size was normal in size. Right Atrium: Right atrial size was normal in size. Pericardium: Trivial pericardial effusion is present. Presence of pericardial fat pad. Venous: The inferior vena cava was not well visualized. IAS/Shunts: The atrial septum is grossly normal. Eleonore Chiquito MD Electronically signed by Eleonore Chiquito MD Signature Date/Time: 05/28/2020/8:37:21 AM    Final     Microbiology: Recent Results (from the past 240 hour(s))  Surgical PCR screen     Status: Abnormal    Collection Time: 05/25/20  5:50 PM   Specimen: Nasal Mucosa; Nasal Swab  Result Value Ref Range Status   MRSA, PCR NEGATIVE NEGATIVE Final   Staphylococcus aureus POSITIVE (A) NEGATIVE Final    Comment: (NOTE) The Xpert SA Assay (FDA approved for NASAL specimens in patients 19 years of age and older), is one component of a comprehensive surveillance program. It is not intended to diagnose infection nor to guide or monitor treatment. Performed at [facility], [place] [address] [place], [place] [address]      Labs: Basic Metabolic Panel: Recent Labs  Lab 05/28/20 0320 05/29/20 0417 05/30/20 0555 05/31/20 0627 06/01/20 0602  NA 139 138 136 136 135  K 3.8 3.6 3.7 3.5 4.0  CL 100 102 99 101 104  CO2 _0 GLUCOSE 160* 142* 207* 152* 208*  BUN 43* 39* 37* 32* 33*  CREATININE 1.60* 1.52* 1.41* 1.28* 1.21  CALCIUM 8.7* 8.8* 8.6* 8.3* 8.8*   Liver Function Tests: Recent Labs  Lab 05/30/20 0555 05/31/20 0627  AST 24 50*  ALT 28 45*  ALKPHOS 95 86  BILITOT 1.8* 1.2  PROT 6.3* 6.6  ALBUMIN 3.1* 2.9*   No results for input(s): LIPASE, AMYLASE in the last 168 hours. No results for input(s): AMMONIA in the last 168 hours. CBC: Recent Labs  Lab 05/28/20 0320 05/29/20 0417 05/30/20 0555 05/31/20 0627 06/01/20 0602  WBC 10.7* 11.9* 9.1 9.9 11.9*  HGB 13.1 14.5 13.7 13.0 13.7  HCT 41.5 44.5 42.5 40.5 42.4  MCV 91.6 89.0 90.0 90.4 90.6  PLT 407* 465* 377 355 366   Cardiac Enzymes: No results for input(s): CKTOTAL, CKMB, CKMBINDEX, TROPONINI in the last 168 hours. BNP: BNP (last 3 results) No results for input(s): BNP in the last 8760 hours.  ProBNP (last 3 results) No results for input(s): PROBNP in the last 8760 hours.  CBG: Recent Labs  Lab 06/01/20 0551 06/01/20 1055 06/01/20 1537 06/01/20 2205 06/02/20 0624  GLUCAP 197* 215* 235* 281* 188*       Signed:  Nita Sells MD   [facility]  06/02/2020, 10:11 AM

## 2020-06-02 NOTE — Progress Notes (Addendum)
Patient ID: [name], male   DOB: November 29, 1955, 65 y.o.   MRN: [id number]     Advanced Heart Failure Rounding Note  PCP-Cardiologist: Sinclair Grooms, MD   Subjective:   - Extubated 5/22 - CHB episode 5/23, amiodarone stopped.  - 5/24 dobutamine stopped.  - 5/26 Repeat limited echo with EF back up to 60-65%, normal RV.  - 5/26 dual chamber ICD implanted   Feels ok. Denies SOB. Unable to use CPAP.   Objective:   Weight Range: 127.4 kg Body mass index is 39.19 kg/m.   Vital Signs:   Temp:  [97.4 F (36.3 C)-98.4 F (36.9 C)] 97.9 F (36.6 C) (05/31 0553) Pulse Rate:  [74-80] 80 (05/31 0553) Resp:  [15-18] 17 (05/31 0553) BP: (105-151)/(66-86) 125/66 (05/31 0553) SpO2:  [95 %-98 %] 98 % (05/31 0553) Weight:  [127.4 kg] 127.4 kg (05/31 0553) Last BM Date: 05/31/20  Weight change: Filed Weights   05/31/20 0500 06/01/20 0554 06/02/20 0553  Weight: 126.6 kg 127.1 kg 127.4 kg    Intake/Output:   Intake/Output Summary (Last 24 hours) at 06/02/2020 0704 Last data filed at 06/01/2020 2156 Gross per 24 hour  Intake 960 ml  Output 1800 ml  Net -840 ml      Physical Exam  General:   No resp difficulty HEENT: normal Neck: supple. no JVD. Carotids 2+ bilat; no bruits. No lymphadenopathy or thryomegaly appreciated. Cor: PMI nondisplaced. Regular rate & rhythm. No rubs, gallops or murmurs. L upper chest dressing.  Lungs:  LLL Crackles Abdomen: obese, soft, nontender, nondistended. No hepatosplenomegaly. No bruits or masses. Good bowel sounds. Extremities: no cyanosis, clubbing, rash, edema Neuro: alert & orientedx3, cranial nerves grossly intact. moves all 4 extremities w/o difficulty. Affect pleasant   Telemetry  NSR 70-80s personally reviewed.   Labs    CBC Recent Labs    05/31/20 0627 06/01/20 0602  WBC 9.9 11.9*  HGB 13.0 13.7  HCT 40.5 42.4  MCV 90.4 90.6  PLT 355 673   Basic Metabolic Panel Recent Labs    05/31/20 0627 06/01/20 0602  NA 136  135  K 3.5 4.0  CL 101 104  CO2 26 24  GLUCOSE 152* 208*  BUN 32* 33*  CREATININE 1.28* 1.21  CALCIUM 8.3* 8.8*   Liver Function Tests Recent Labs    05/31/20 0627  AST 50*  ALT 45*  ALKPHOS 86  BILITOT 1.2  PROT 6.6  ALBUMIN 2.9*   No results for input(s): LIPASE, AMYLASE in the last 72 hours. Cardiac Enzymes No results for input(s): CKTOTAL, CKMB, CKMBINDEX, TROPONINI in the last 72 hours.  BNP: BNP (last 3 results) No results for input(s): BNP in the last 8760 hours.  ProBNP (last 3 results) No results for input(s): PROBNP in the last 8760 hours.   D-Dimer No results for input(s): DDIMER in the last 72 hours. Hemoglobin A1C No results for input(s): HGBA1C in the last 72 hours. Fasting Lipid Panel No results for input(s): CHOL, HDL, LDLCALC, TRIG, CHOLHDL, LDLDIRECT in the last 72 hours. Thyroid Function Tests No results for input(s): TSH, T4TOTAL, T3FREE, THYROIDAB in the last 72 hours.  Invalid input(s): FREET3  Other results:   Imaging    No results found.   Medications:     Scheduled Medications: . aspirin  81 mg Oral Daily  . atorvastatin  80 mg Oral Daily  . carvedilol  3.125 mg Oral BID WC  . chlorhexidine gluconate (MEDLINE KIT)  15 mL Mouth Rinse  BID  . Chlorhexidine Gluconate Cloth  6 each Topical Daily  . cyclobenzaprine  5 mg Oral TID  . docusate sodium  100 mg Oral BID  . furosemide  40 mg Oral Daily  . gabapentin  200 mg Oral BID  . insulin aspart  0-15 Units Subcutaneous TID WC  . insulin aspart  6 Units Subcutaneous TID WC  . insulin detemir  38 Units Subcutaneous BID  . mouth rinse  15 mL Mouth Rinse BID  . multivitamin with minerals  1 tablet Oral Daily  . pantoprazole  40 mg Oral BID AC  . potassium chloride  20 mEq Oral Daily  . sodium chloride flush  3 mL Intravenous Q12H  . sodium chloride flush  3 mL Intravenous Q12H  . sodium chloride flush  3 mL Intravenous Q12H    Infusions: . sodium chloride Stopped (05/25/20  1831)  . sodium chloride Stopped (05/28/20 1200)    PRN Medications: sodium chloride, acetaminophen, diclofenac Sodium, ketotifen, lip balm, nitroGLYCERIN, ondansetron (ZOFRAN) IV, oxyCODONE, polyvinyl alcohol, sodium chloride flush, sodium chloride flush, sodium chloride flush  Assessment/Plan   1. Shock: Primarily vasodilatory shock, suspect due to prolonged hypotension with arrest and tissue ischemia. Echo with LV EF in the 45% range with mildly dilated RV but function appeared relatively normal.  Resolved, now off dobutamine and pressors.  2. Acute on chronic HF with mid-rage EF: Ischemic cardiomyopathy, EF in the 45% range on initial echo but now back up to 60-65% on repeat limited echo 5/26.  Suspect stunning with arrest and now improved.  Patient was markedly volume overloaded, now diuresed well.  - Volume status stable. Restart jardiance soon. Once restarted could stop lasix.  - Continue Lasix 40 mg po daily.  - Continue low dose carvedilol.   3. CAD: Patient appears to have CTO of the mid RCA with good collaterals.  I do not think this was acute MI from plaque rupture.  HS-TnI significantly elevated, but think this was generalized ischemia from hypotension with prolonged cardiac arrest.  - Continue ASA.  - Per EP, hold Plavix x 3 . Start plavix today.  - Continue statin.  4. Cardiac arrest: VF arrest with immediate CPR but prolonged time to ROSC (about 45 minutes). Suspect scar-mediated from old RCA occlusion.  Stopped amio drip with 3rd degree. EF now normal.   - ICD placed 5/26 for secondary prevention.    4. AKI: Creatinine peaked 3. Most recent creatinine 1.2. No labs today.  -  Suspect ATN in setting of prolonged cardiac arrest.  5. Acute hypoxemic respiratory failure: CXR with bilateral upper lobe infiltrates likely due to aspiration with arrest.  Extubated 5/22. Sats stable on on room air.   - He has completed antibiotics.  7. Elevated LFTs: Relatively mild considering  down-time.  Shock liver. Resolved.  8. Diabetes: Insulin.  Prior to admit on 12.5 mg jardiance + insulin. - Restart jardiance per primary team. Once restarted stop lasix.  9. Thrombocytopenia: Resolved.  10. 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  No further HB.  - ICD placed 5/26  CIR when insurance approved.     Darrick Grinder, NP-C  06/02/2020 7:04 AM  Patient seen with NP, agree with the above note.   Stable today, has CIR bed.  Can start Jardiance 10 mg daily and stop Lasix.  Should restart on Plavix.    Will arrange followup in CHF clinic.   Loralie Champagne 06/02/2020

## 2020-06-02 NOTE — H&P (Signed)
Physical Medicine and Rehabilitation Admission H&P    CC: Debility   HPI: [name] is a 65 year old male with history of HTN, RBBB, T2DM, chronic LBP who was admitted on 05/19/20 via [facility] with reports of chest pain followed by VF cardiac arrest w/30 minutes CPR prior to  ROSC. 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  He was started on amiodarone drip and cardiogenic shock felt to be due to septic shock from aspiration PNA. He was treated with IV antibiotics as well as multiple pressors and required precedex to manage agitation.  He tolerated extubation and  AKI with shocked liver felt to be due to prolonged arrest and fluid overload improved with lasix drip. He had recurrent 7 second CHB on 05/23 requiring dobutamine and amiodarone d/c.  Repeat limited echo showed normal RV function and size with no wall abnormality.  He underwent dual chamber ICD implantation by Dr. Quentin Ore on 05/26 with recommendations to hold Plavix for 3 days and NO driving X 6 months.    Thrombocytopenia is resolving. Po intake improving, prinzide on hold due to soft BP and  and   therapy has been ongoing and patient continues to be limited by chest wall pain, LBP, weakness and balance deficits.   Review of Systems  Constitutional: Negative for chills and fever.  HENT: Negative for hearing loss.   Eyes: Negative for blurred vision.  Respiratory: Negative for cough and shortness of breath.   Cardiovascular: Positive for chest pain. Negative for leg swelling.  Gastrointestinal: Negative for constipation, heartburn and nausea.  Genitourinary: Negative for dysuria and urgency.  Musculoskeletal: Positive for back pain and joint pain.  Skin: Negative for rash.  Neurological: Positive for sensory change (left fingers chronically numb).  Psychiatric/Behavioral: The patient does not have insomnia.      Past Medical History:  Diagnosis Date  . Adenomatous polyp   . Cataract    left eye for sure   . Chronic pain   . Diabetes mellitus   . GERD (gastroesophageal reflux disease)   . History of colonic polyps   . History of hip replacement, total   . Hx of anaphylactic shock   . Hyperlipidemia   . Hypertension   . Left anterior hemiblock   . Obesity   . RBBB (right bundle branch block with left anterior fascicular block)   . 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 APPENDECTOMY    . CERVICAL FUSION    . CERVICAL LAMINECTOMY    . COLONOSCOPY    . CORONARY/GRAFT ACUTE MI REVASCULARIZATION N/A 05/19/2020   Procedure: Coronary/Graft Acute MI Revascularization;  Surgeon: Belva Crome, MD;  Location: [facility];  Service: Cardiovascular;  Laterality: N/A;  . HAND SURGERY    . HARDWARE REMOVAL Left 09/10/2012   Procedure: HARDWARE REMOVAL;  Surgeon: Jessy Oto, MD;  Location: [facility];  Service: Orthopedics;  Laterality: Left;  . HIP SURGERY  12/29/09   R THR  ([place])  . HIP SURGERY     bilateral  . ICD IMPLANT N/A 05/28/2020   Procedure: ICD IMPLANT;  Surgeon: Vickie Epley, MD;  Location: [facility];  Service: Cardiovascular;  Laterality: N/A;  . LEFT HEART CATH AND CORONARY ANGIOGRAPHY N/A 05/19/2020   Procedure: LEFT HEART  CATH AND CORONARY ANGIOGRAPHY;  Surgeon: Belva Crome, MD;  Location: [facility];  Service: Cardiovascular;  Laterality: N/A;  . TOTAL HIP ARTHROPLASTY Left 09/10/2012   Procedure: REMOVAL OF HARDWARE LEFT PROXIMAL THIGH/FEMUR AND LEFT TOTAL HIP ARTHROPLASTY;  Surgeon: Jessy Oto, MD;  Location: [facility];  Service: Orthopedics;  Laterality: Left;  . TOTAL KNEE ARTHROPLASTY  01/2008   left knee   Family History  Problem Relation Age of Onset  . Cancer Mother        lung  . Crohn's disease Father   . Alcohol abuse Brother   . Colon polyps Brother   . Colon  cancer Neg Hx     Social History:  Married. Reports was helping care for disabled mother for past 2-3 years. He reports that he quit smoking about 6 years ago. His smoking use included cigarettes. He has a 63.00 pack-year smoking history. He has quit using smokeless tobacco as a teenager.  His smokeless tobacco use included chew. He reports that he does not drink alcohol and does not use drugs.   Allergies  Allergen Reactions  . Toradol [Ketorolac Tromethamine] Anaphylaxis  . Codeine     REACTION: itch  . Morphine And Related Nausea And Vomiting  . Naproxen     REACTION: anaphylaxis   Medications Prior to Admission  Medication Sig Dispense Refill  . aspirin EC 81 MG tablet Take 81 mg by mouth daily. Swallow whole.    Marland Kitchen atorvastatin (LIPITOR) 40 MG tablet Take 1 tablet (40 mg total) by mouth daily. 30 tablet 1  . carboxymethylcellulose (REFRESH PLUS) 0.5 % SOLN Place 1 drop into both eyes 3 (three) times daily as needed (dry eyes).    . cyclobenzaprine (FLEXERIL) 10 MG tablet Take 10 mg by mouth 3 (three) times daily.    . diclofenac sodium (VOLTAREN) 1 % GEL Apply 4 g topically 3 (three) times daily as needed (pain).    Marland Kitchen empagliflozin (JARDIANCE) 25 MG TABS tablet Take 12.5 mg by mouth daily.    Marland Kitchen gabapentin (NEURONTIN) 400 MG capsule Take 400 mg by mouth 4 (four) times daily.    . Insulin Aspart FlexPen 100 UNIT/ML SOPN Inject 40-50 Units into the skin See admin instructions. Inject 40 units with breakfast and lunch, inject 50 units with evening meal    . Insulin Detemir (LEVEMIR FLEXTOUCH) 100 UNIT/ML Pen 100 units at bedtime (Patient taking differently: Inject 80 Units into the skin at bedtime.) 30 pen 1  . lidocaine (LIDODERM) 5 % Place 1 patch onto the skin daily as needed (pain).    Marland Kitchen lisinopril-hydrochlorothiazide (PRINZIDE,ZESTORETIC) 20-25 MG tablet Take 1 tablet by mouth daily. 30 tablet 1  . loratadine (CLARITIN) 10 MG tablet Take 10 mg by mouth daily.    . Multiple  Vitamins-Minerals (MULTIVITAMIN WITH MINERALS) tablet Take 1 tablet by mouth daily.    . Omega-3 Fatty Acids (FISH OIL) 1000 MG CAPS Take 2,000 mg by mouth daily.    . pantoprazole (PROTONIX) 40 MG tablet Take 40 mg by mouth 2 (two) times daily before a meal.    . amoxicillin-clavulanate (AUGMENTIN) 875-125 MG tablet TAKE ONE TABLET BY MOUTH ONE HOUR PRIOR TO DENTAL PROCEDURE THEN TAKE EVERY 12 HOURS FOR 2 DOSES (Patient not taking: No sig reported) 3 tablet 1  . Blood Glucose Monitoring Suppl (ONE TOUCH ULTRA SYSTEM KIT) W/DEVICE KIT 1 kit by Does not apply route once. 1 each 0  . Insulin Pen Needle (NOVOTWIST) 32G X  5 MM MISC USE TO INJECT INSULIN THREE TIMES A DAY 100 each 4  . ONE TOUCH ULTRA TEST test strip Check blood sugar daily and as needed 200 each 4    Drug Regimen Review  Drug regimen was reviewed and remains appropriate with no significant issues identified  Home: Home Living Family/patient expects to be discharged to:: Private residence Living Arrangements: Spouse/significant other Available Help at Discharge: Family,Available 24 hours/day Type of Home: House Home Access: Stairs to enter [employer] of Steps: 1 Home Layout: One level Bathroom Shower/Tub: Chiropodist: Standard Home Equipment: Environmental consultant - 2 wheels,Bedside commode   Functional History: Prior Function Level of Independence: Independent Comments: Ind with ADLs, mobility and was driving  Functional Status:  Mobility: Bed Mobility Overal bed mobility: Needs Assistance Bed Mobility: Supine to Sit Rolling: Mod assist Supine to sit: HOB elevated,Mod assist Sit to supine: Mod assist,+2 for physical assistance General bed mobility comments: pt OOB in recliner upon arrival and returned pt to recliner at end of session Transfers Overall transfer level: Needs assistance Equipment used: Rolling walker (2 wheeled) Transfers: Sit to/from Stand Sit to Stand: Min guard,+2  safety/equipment Stand pivot transfers: Min assist General transfer comment: cues for hand placement for sit<>stand, min guard for safety to rise from recliner, vc's to not put significant wt through LUE Ambulation/Gait Ambulation/Gait assistance: Min assist,+2 safety/equipment Gait Distance (Feet): 80 Feet Assistive device: Rolling walker (2 wheeled) Gait Pattern/deviations: Wide base of support General Gait Details: needed 3 standing rest breaks. Chair brought behind as pt was unsteady and knees buckling last 5'. Pt needed cues to self monitor Gait velocity: decreased Gait velocity interpretation: <1.31 ft/sec, indicative of household ambulator    ADL: ADL Overall ADL's : Needs assistance/impaired Eating/Feeding Details (indicate cue type and reason): NPO but with strength and AROM WNL for self feeding Grooming: Wash/dry hands,Wash/dry face,Set up,Bed level Upper Body Bathing: Minimal assistance,Bed level Lower Body Bathing: Maximal assistance,Bed level Upper Body Dressing : Minimal assistance,Bed level Upper Body Dressing Details (indicate cue type and reason): education provided on compensatory methods for UB dressing to pacer precautions Lower Body Dressing: Total assistance,Bed level Toilet Transfer: Min guard,Minimal assistance,RW,Ambulation,+2 for safety/equipment Toilet Transfer Details (indicate cue type and reason): simulated via functional mobility with RW and +2 assist with chair follow Toileting- Clothing Manipulation and Hygiene: Total assistance,Bed level Functional mobility during ADLs: Min guard,Minimal assistance,+2 for safety/equipment,Rolling walker General ADL Comments: education provide ADL participation with ICD precautions  Cognition: Cognition Overall Cognitive Status: Within Functional Limits for tasks assessed Orientation Level: Oriented X4 Cognition Arousal/Alertness: Awake/alert Behavior During Therapy: WFL for tasks assessed/performed Overall  Cognitive Status: Within Functional Limits for tasks assessed General Comments: reports feeling sleepy and tired today. "the doctors told me I could take it easy today."  : Blood pressure 124/60, pulse 85, temperature 98.2 F (36.8 C), temperature source Oral, resp. rate 19, height 5' 10.98" (1.803 m), weight 127.4 kg, SpO2 96 %. Physical Exam Vitals and nursing note reviewed.  Constitutional:      Appearance: Normal appearance. He is obese.     Comments:    HENT:     Head: Normocephalic and atraumatic.     Nose: Nose normal.     Mouth/Throat:     Mouth: Mucous membranes are moist.  Eyes:     Extraocular Movements: Extraocular movements intact.     Conjunctiva/sclera: Conjunctivae normal.     Pupils: Pupils are equal, round, and reactive to light.  Cardiovascular:  Rate and Rhythm: Tachycardia present.     Pulses: Normal pulses.     Heart sounds: No murmur heard.     Comments: Left ICD incision C/D/I with steri-strips in place.  Pulmonary:     Effort: Pulmonary effort is normal. No respiratory distress.     Breath sounds: Normal breath sounds. No wheezing.  Chest:     Chest wall: Tenderness present.  Abdominal:     General: Bowel sounds are normal. There is no distension.     Palpations: Abdomen is soft.  Musculoskeletal:        General: Tenderness (neck, low back, both knees.) present. No swelling.     Cervical back: Tenderness present.  Skin:    Comments: ICD in place left chest wall, bruising left axilla, upper arm. Old Left TKA incision, right knee and hip incisions. Foam dressing on healed abrasion mid back  Neurological:     Mental Status: He is alert and oriented to person, place, and time.     Cranial Nerves: No cranial nerve deficit.     Deep Tendon Reflexes: Reflexes normal.     Comments: Decreased LT stocking glove below both ankles. Sensation normal UE's. UE grossly 4/5. LE: 3+ prox to 4/5 at ankles.   Psychiatric:        Mood and Affect: Mood normal.         Behavior: Behavior normal.     Comments:       Results for orders placed or performed during the hospital encounter of 05/19/20 (from the past 48 hour(s))  Glucose, capillary     Status: Abnormal   Collection Time: 05/31/20  4:26 PM  Result Value Ref Range   Glucose-Capillary 219 (H) 70 - 99 mg/dL    Comment: Glucose reference range applies only to samples taken after fasting for at least 8 hours.  Glucose, capillary     Status: Abnormal   Collection Time: 05/31/20  8:41 PM  Result Value Ref Range   Glucose-Capillary 237 (H) 70 - 99 mg/dL    Comment: Glucose reference range applies only to samples taken after fasting for at least 8 hours.   Comment 1 Notify RN   Glucose, capillary     Status: Abnormal   Collection Time: 06/01/20  5:51 AM  Result Value Ref Range   Glucose-Capillary 197 (H) 70 - 99 mg/dL    Comment: Glucose reference range applies only to samples taken after fasting for at least 8 hours.  CBC     Status: Abnormal   Collection Time: 06/01/20  6:02 AM  Result Value Ref Range   WBC 11.9 (H) 4.0 - 10.5 K/uL   RBC 4.68 4.22 - 5.81 MIL/uL   Hemoglobin 13.7 13.0 - 17.0 g/dL   HCT 42.4 39.0 - 52.0 %   MCV 90.6 80.0 - 100.0 fL   MCH 29.3 26.0 - 34.0 pg   MCHC 32.3 30.0 - 36.0 g/dL   RDW 14.8 11.5 - 15.5 %   Platelets 366 150 - 400 K/uL   nRBC 0.0 0.0 - 0.2 %    Comment: Performed at [facility], [place] [address] [place], [place] [address]  Basic metabolic panel     Status: Abnormal   Collection Time: 06/01/20  6:02 AM  Result Value Ref Range   Sodium 135 135 - 145 mmol/L   Potassium 4.0 3.5 - 5.1 mmol/L   Chloride 104 98 - 111 mmol/L   CO2 24 22 - 32 mmol/L  Glucose, Bld 208 (H) 70 - 99 mg/dL    Comment: Glucose reference range applies only to samples taken after fasting for at least 8 hours.   BUN 33 (H) 8 - 23 mg/dL   Creatinine, Ser 1.21 0.61 - 1.24 mg/dL   Calcium 8.8 (L) 8.9 - 10.3 mg/dL   GFR, Estimated >60 >60 mL/min    Comment:  (NOTE) Calculated using the CKD-EPI Creatinine Equation (2021)    Anion gap 7 5 - 15    Comment: Performed at [facility] [address] [place], [place] [address]  Glucose, capillary     Status: Abnormal   Collection Time: 06/01/20 10:55 AM  Result Value Ref Range   Glucose-Capillary 215 (H) 70 - 99 mg/dL    Comment: Glucose reference range applies only to samples taken after fasting for at least 8 hours.  Glucose, capillary     Status: Abnormal   Collection Time: 06/01/20  3:37 PM  Result Value Ref Range   Glucose-Capillary 235 (H) 70 - 99 mg/dL    Comment: Glucose reference range applies only to samples taken after fasting for at least 8 hours.  Glucose, capillary     Status: Abnormal   Collection Time: 06/01/20 10:05 PM  Result Value Ref Range   Glucose-Capillary 281 (H) 70 - 99 mg/dL    Comment: Glucose reference range applies only to samples taken after fasting for at least 8 hours.  Glucose, capillary     Status: Abnormal   Collection Time: 06/02/20  6:24 AM  Result Value Ref Range   Glucose-Capillary 188 (H) 70 - 99 mg/dL    Comment: Glucose reference range applies only to samples taken after fasting for at least 8 hours.  Glucose, capillary     Status: Abnormal   Collection Time: 06/02/20 11:48 AM  Result Value Ref Range   Glucose-Capillary 212 (H) 70 - 99 mg/dL    Comment: Glucose reference range applies only to samples taken after fasting for at least 8 hours.   No results found.     Medical Problem List and Plan: 1.  Functional and mobility deficits secondary to cardiac arrest and subsequent debility  -patient may shower  -ELOS/Goals: 9-12 days, Mod I to supervision goals with PT and OT 2.  Antithrombotics: -DVT/anticoagulation:  Pharmaceutical: Lovenox  -antiplatelet therapy: DAPT 3. Chronic pain/Pain Management: Lidocaine/volatren gel to LBP  --continue Neurontin bid and flexeril tid.   -voltaren gel to back and neck 4. Mood: LCSW to follow for  evaluation and support.   -antipsychotic agents: N/A 5. Neuropsych: This patient is capable of making decisions on his own behalf. 6. Skin/Wound Care: Routine pressure relief measures.  -monitor incision sites, wounds  7. Fluids/Electrolytes/Nutrition: Monitor I/O. Check lytes in am.  8. 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 NO driving for 6 months.   --continue Coreg, ASA, Plavix and Lipitor.   --No heavy lifting or vigorous activity with the left arm for 6 to 8 weeks.  Do not raise your left arm above your head for one week.  Gradually raise your affected arm as drawn below             06/04/20                       06/05/20                     06/06/20  06/07/20  9. T2DM: Monitor BS ac/hs. Continue Levemir 38 units bid w/ novolog  6 units tid and slowly titrate up to home dose.  --PTA was on Levemir 80 units HS with humalog 40/40/50 for meal coverage.  -adjust regimen as needed  10. Abnormal LFTs: follow up labs in am.  11. Leucocytosis: Monitor for sign for fevers or other signs of infection.  12. Acute renal failure: Is slowly resolving with BUN/SCr 56/2.34-->33/1.21  ---Repeat BMET in am.          Bary Leriche, PA-C 06/02/2020

## 2020-06-02 NOTE — Progress Notes (Signed)
Inpatient Rehab Admissions Coordinator:   I have a bed available for pt to admit to CIR today.  Pt okay with proceeding with admission under Medicare A/B instead of waiting for [facility] auth.  Dr. Verlon Au in agreement with admit today.  Will let pt/family and TOC team know.   Shann Medal, PT, DPT Admissions Coordinator [phone] 06/02/20  10:26 AM

## 2020-06-02 NOTE — Progress Notes (Signed)
Met with the patient and his wife to introduce role of the nurse CM and initiate education. Discussed dietary modifications, sternal precautions post defibrillator placement and fluid restriction. Dietary consult to education regarding HH low salt diet. Reviewed Zone tool for HF and living with heart failure. Patient and wife given handouts on dietary modifications, precautions, carb counting and discussed OSA; full mask CPAP not worn due to comfort. Discussed nose attachment vs full mask, request respiratory to trial options for the patient per MD note. Also discussed low albumin and protein food options. Continue to follow along to discharge to address educational needs and collaborate with the SW to facilitate prep for discharge. Margarito Liner, RN

## 2020-06-02 NOTE — TOC Transition Note (Signed)
Transition of Care [facility]) - CM/SW Discharge Note Heart Failure   Patient Details  Name: [name] MRN: [id number] Date of Birth: 1955-08-29  Transition of Care [facility]) CM/SW Contact:  [place], [place] Phone Number: 06/02/2020, 11:24 AM   Clinical Narrative:    Patient will DC to: [facility] Anticipated DC date: 06/02/2020 Family notified: Arville Go patient's spouse   Per MD patient ready for DC to [facility]. Per request of CIR representative, CSW spoke with the patient and his wife at bedside and informed them that a bed is available today at [facility]. The patient's wife had several questions about CIR including how long [name] will receive rehab and will he see a dietician or nutrionist while at [facility] and what their visitation policy looks like. CSW reached out to CIR representative to follow up with the patient and his wife regarding their questions and concerns and CIR reported they will get in touch with the family to follow up from their discussion last week. Patient to follow up with [facility] outpatient clinic upon discharge and reiterated scheduled appointment.  CSW will sign off for now as social work intervention is no longer needed. Please consult [place] again if new needs arise.      Final next level of care: IP Rehab Facility Barriers to Discharge: No Barriers Identified   Patient Goals and CMS Choice        Discharge Placement                       Discharge Plan and Services In-house Referral: Clinical Social Work                                   Social Determinants of Health (SDOH) Interventions Food Insecurity Interventions: Intervention Not Indicated Financial Strain Interventions: Intervention Not Indicated Housing Interventions: Intervention Not Indicated Transportation Interventions: Intervention Not Indicated   Readmission Risk Interventions No flowsheet data found.    Varsha Knock, MSW, [place] Heart Failure Social Worker

## 2020-06-02 NOTE — Progress Notes (Signed)
Inpatient Rehabilitation Medication Review by a Pharmacist  A complete drug regimen review was completed for this patient to identify any potential clinically significant medication issues.  Clinically significant medication issues were identified:  yes   Type of Medication Issue Identified Description of Issue Urgent (address now) Non-Urgent (address on AM team rounds) Plan Plan Accepted by Provider? (Yes / No / Pending AM Rounds)  Drug Interaction(s) (clinically significant)       Duplicate Therapy       Allergy       No Medication Administration End Date       Incorrect Dose       Additional Drug Therapy Needed  restart Jardiance     Other         Name of provider notified for issues identified: Reesa Chew, PA  Provider Method of Notification: secure chat   For non-urgent medication issues to be resolved on team rounds tomorrow morning a [facility] Secure Chat Handoff was sent to: Benetta Spar   Pharmacist comments: Vania Rea is to restart and lasix to dc upon restarting. Jardiance and Lasix not ordered  Time spent performing this drug regimen review (minutes):  20 minutes   Thank you for allowing [place] to participate in this patients care. Jens Som, PharmD 06/02/2020 5:50 PM  Please check [email or web address] for unit-specific pharmacy phone numbers.

## 2020-06-02 NOTE — H&P (Signed)
Physical Medicine and Rehabilitation Admission H&P     CC: Debility     HPI: [name] is a 65 year old male with history of HTN, RBBB, T2DM, chronic LBP who was admitted on 05/19/20 via [facility] with reports of chest pain followed by VF cardiac arrest w/30 minutes CPR prior to  ROSC. 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  He was started on amiodarone drip and cardiogenic shock felt to be due to septic shock from aspiration PNA. He was treated with IV antibiotics as well as multiple pressors and required precedex to manage agitation.  He tolerated extubation and  AKI with shocked liver felt to be due to prolonged arrest and fluid overload improved with lasix drip. He had recurrent 7 second CHB on 05/23 requiring dobutamine and amiodarone d/c.  Repeat limited echo showed normal RV function and size with no wall abnormality.  He underwent dual chamber ICD implantation by Dr. Quentin Ore on 05/26 with recommendations to hold Plavix for 3 days and NO driving X 6 months.     Thrombocytopenia is resolving. Po intake improving, prinzide on hold due to soft BP and  and   therapy has been ongoing and patient continues to be limited by chest wall pain, LBP, weakness and balance deficits.    Review of Systems  Constitutional: Negative for chills and fever.  HENT: Negative for hearing loss.   Eyes: Negative for blurred vision.  Respiratory: Negative for cough and shortness of breath.   Cardiovascular: Positive for chest pain. Negative for leg swelling.  Gastrointestinal: Negative for constipation, heartburn and nausea.  Genitourinary: Negative for dysuria and urgency.  Musculoskeletal: Positive for back pain and joint pain.  Skin: Negative for rash.  Neurological: Positive for sensory change (left fingers chronically numb).  Psychiatric/Behavioral: The patient does not have insomnia.            Past Medical History:  Diagnosis Date  . Adenomatous polyp    . Cataract      left eye for sure   . Chronic pain    . Diabetes mellitus    . GERD (gastroesophageal reflux disease)    . History of colonic polyps    . History of hip replacement, total    . Hx of anaphylactic shock    . Hyperlipidemia    . Hypertension    . Left anterior hemiblock    . Obesity    . RBBB (right bundle branch block with left anterior fascicular block)    . 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 APPENDECTOMY      . CERVICAL FUSION      . CERVICAL LAMINECTOMY      . COLONOSCOPY      . CORONARY/GRAFT ACUTE MI REVASCULARIZATION N/A 05/19/2020    Procedure: Coronary/Graft Acute MI Revascularization;  Surgeon: Belva Crome, MD;  Location: [place] [facility];  Service: Cardiovascular;  Laterality: N/A;  . HAND SURGERY      . HARDWARE REMOVAL Left 09/10/2012    Procedure: HARDWARE REMOVAL;  Surgeon: Jessy Oto, MD;  Location: [place];  Service: Orthopedics;  Laterality: Left;  . HIP SURGERY   12/29/09    R THR  ([place])  . HIP  SURGERY        bilateral  . ICD IMPLANT N/A 05/28/2020    Procedure: ICD IMPLANT;  Surgeon: Vickie Epley, MD;  Location: [place] [facility];  Service: Cardiovascular;  Laterality: N/A;  . LEFT HEART CATH AND CORONARY ANGIOGRAPHY N/A 05/19/2020    Procedure: LEFT HEART CATH AND CORONARY ANGIOGRAPHY;  Surgeon: Belva Crome, MD;  Location: [place] [facility];  Service: Cardiovascular;  Laterality: N/A;  . TOTAL HIP ARTHROPLASTY Left 09/10/2012    Procedure: REMOVAL OF HARDWARE LEFT PROXIMAL THIGH/FEMUR AND LEFT TOTAL HIP ARTHROPLASTY;  Surgeon: Jessy Oto, MD;  Location: [place];  Service: Orthopedics;  Laterality: Left;  . TOTAL KNEE ARTHROPLASTY   01/2008    left knee         Family History  Problem Relation Age of Onset  . Cancer Mother          lung  . Crohn's disease Father    .  Alcohol abuse Brother    . Colon polyps Brother    . Colon cancer Neg Hx        Social History:  Married. Reports was helping care for disabled mother for past 2-3 years. He reports that he quit smoking about 6 years ago. His smoking use included cigarettes. He has a 63.00 pack-year smoking history. He has quit using smokeless tobacco as a teenager.  His smokeless tobacco use included chew. He reports that he does not drink alcohol and does not use drugs.          Allergies  Allergen Reactions  . Toradol [Ketorolac Tromethamine] Anaphylaxis  . Codeine        REACTION: itch  . Morphine And Related Nausea And Vomiting  . Naproxen        REACTION: anaphylaxis          Medications Prior to Admission  Medication Sig Dispense Refill  . aspirin EC 81 MG tablet Take 81 mg by mouth daily. Swallow whole.      Marland Kitchen atorvastatin (LIPITOR) 40 MG tablet Take 1 tablet (40 mg total) by mouth daily. 30 tablet 1  . carboxymethylcellulose (REFRESH PLUS) 0.5 % SOLN Place 1 drop into both eyes 3 (three) times daily as needed (dry eyes).      . cyclobenzaprine (FLEXERIL) 10 MG tablet Take 10 mg by mouth 3 (three) times daily.      . diclofenac sodium (VOLTAREN) 1 % GEL Apply 4 g topically 3 (three) times daily as needed (pain).      Marland Kitchen empagliflozin (JARDIANCE) 25 MG TABS tablet Take 12.5 mg by mouth daily.      Marland Kitchen gabapentin (NEURONTIN) 400 MG capsule Take 400 mg by mouth 4 (four) times daily.      . Insulin Aspart FlexPen 100 UNIT/ML SOPN Inject 40-50 Units into the skin See admin instructions. Inject 40 units with breakfast and lunch, inject 50 units with evening meal      . Insulin Detemir (LEVEMIR FLEXTOUCH) 100 UNIT/ML Pen 100 units at bedtime (Patient taking differently: Inject 80 Units into the skin at bedtime.) 30 pen 1  . lidocaine (LIDODERM) 5 % Place 1 patch onto the skin daily as needed (pain).      Marland Kitchen lisinopril-hydrochlorothiazide (PRINZIDE,ZESTORETIC) 20-25 MG tablet Take 1 tablet by mouth daily.  30 tablet 1  . loratadine (CLARITIN) 10 MG tablet Take 10 mg by mouth daily.      . Multiple Vitamins-Minerals (MULTIVITAMIN WITH MINERALS) tablet Take 1 tablet by  mouth daily.      . Omega-3 Fatty Acids (FISH OIL) 1000 MG CAPS Take 2,000 mg by mouth daily.      . pantoprazole (PROTONIX) 40 MG tablet Take 40 mg by mouth 2 (two) times daily before a meal.      . amoxicillin-clavulanate (AUGMENTIN) 875-125 MG tablet TAKE ONE TABLET BY MOUTH ONE HOUR PRIOR TO DENTAL PROCEDURE THEN TAKE EVERY 12 HOURS FOR 2 DOSES (Patient not taking: No sig reported) 3 tablet 1  . Blood Glucose Monitoring Suppl (ONE TOUCH ULTRA SYSTEM KIT) W/DEVICE KIT 1 kit by Does not apply route once. 1 each 0  . Insulin Pen Needle (NOVOTWIST) 32G X 5 MM MISC USE TO INJECT INSULIN THREE TIMES A DAY 100 each 4  . ONE TOUCH ULTRA TEST test strip Check blood sugar daily and as needed 200 each 4      Drug Regimen Review  Drug regimen was reviewed and remains appropriate with no significant issues identified   Home: Home Living Family/patient expects to be discharged to:: Private residence Living Arrangements: Spouse/significant other Available Help at Discharge: Family,Available 24 hours/day Type of Home: House Home Access: Stairs to enter [employer] of Steps: 1 Home Layout: One level Bathroom Shower/Tub: Chiropodist: Standard Home Equipment: Environmental consultant - 2 wheels,Bedside commode   Functional History: Prior Function Level of Independence: Independent Comments: Ind with ADLs, mobility and was driving   Functional Status:  Mobility: Bed Mobility Overal bed mobility: Needs Assistance Bed Mobility: Supine to Sit Rolling: Mod assist Supine to sit: HOB elevated,Mod assist Sit to supine: Mod assist,+2 for physical assistance General bed mobility comments: pt OOB in recliner upon arrival and returned pt to recliner at end of session Transfers Overall transfer level: Needs assistance Equipment  used: Rolling walker (2 wheeled) Transfers: Sit to/from Stand Sit to Stand: Min guard,+2 safety/equipment Stand pivot transfers: Min assist General transfer comment: cues for hand placement for sit<>stand, min guard for safety to rise from recliner, vc's to not put significant wt through LUE Ambulation/Gait Ambulation/Gait assistance: Min assist,+2 safety/equipment Gait Distance (Feet): 80 Feet Assistive device: Rolling walker (2 wheeled) Gait Pattern/deviations: Wide base of support General Gait Details: needed 3 standing rest breaks. Chair brought behind as pt was unsteady and knees buckling last 5'. Pt needed cues to self monitor Gait velocity: decreased Gait velocity interpretation: <1.31 ft/sec, indicative of household ambulator   ADL: ADL Overall ADL's : Needs assistance/impaired Eating/Feeding Details (indicate cue type and reason): NPO but with strength and AROM WNL for self feeding Grooming: Wash/dry hands,Wash/dry face,Set up,Bed level Upper Body Bathing: Minimal assistance,Bed level Lower Body Bathing: Maximal assistance,Bed level Upper Body Dressing : Minimal assistance,Bed level Upper Body Dressing Details (indicate cue type and reason): education provided on compensatory methods for UB dressing to pacer precautions Lower Body Dressing: Total assistance,Bed level Toilet Transfer: Min guard,Minimal assistance,RW,Ambulation,+2 for safety/equipment Toilet Transfer Details (indicate cue type and reason): simulated via functional mobility with RW and +2 assist with chair follow Toileting- Clothing Manipulation and Hygiene: Total assistance,Bed level Functional mobility during ADLs: Min guard,Minimal assistance,+2 for safety/equipment,Rolling walker General ADL Comments: education provide ADL participation with ICD precautions   Cognition: Cognition Overall Cognitive Status: Within Functional Limits for tasks assessed Orientation Level: Oriented  X4 Cognition Arousal/Alertness: Awake/alert Behavior During Therapy: WFL for tasks assessed/performed Overall Cognitive Status: Within Functional Limits for tasks assessed General Comments: reports feeling sleepy and tired today. "the doctors told me I could take it easy today."   :  Blood pressure 124/60, pulse 85, temperature 98.2 F (36.8 C), temperature source Oral, resp. rate 19, height 5' 10.98" (1.803 m), weight 127.4 kg, SpO2 96 %. Physical Exam Vitals and nursing note reviewed.  Constitutional:      Appearance: Normal appearance. He is obese.     Comments:    HENT:     Head: Normocephalic and atraumatic.     Nose: Nose normal.     Mouth/Throat:     Mouth: Mucous membranes are moist.  Eyes:     Extraocular Movements: Extraocular movements intact.     Conjunctiva/sclera: Conjunctivae normal.     Pupils: Pupils are equal, round, and reactive to light.  Cardiovascular:     Rate and Rhythm: Tachycardia present.     Pulses: Normal pulses.     Heart sounds: No murmur heard.     Comments: Left ICD incision C/D/I with steri-strips in place.  Pulmonary:     Effort: Pulmonary effort is normal. No respiratory distress.     Breath sounds: Normal breath sounds. No wheezing.  Chest:     Chest wall: Tenderness present.  Abdominal:     General: Bowel sounds are normal. There is no distension.     Palpations: Abdomen is soft.  Musculoskeletal:        General: Tenderness (neck, low back, both knees.) present. No swelling.     Cervical back: Tenderness present.  Skin:    Comments: ICD in place left chest wall, bruising left axilla, upper arm. Old Left TKA incision, right knee and hip incisions. Foam dressing on healed abrasion mid back  Neurological:     Mental Status: He is alert and oriented to person, place, and time.     Cranial Nerves: No cranial nerve deficit.     Deep Tendon Reflexes: Reflexes normal.     Comments: Decreased LT stocking glove below both ankles. Sensation  normal UE's. UE grossly 4/5. LE: 3+ prox to 4/5 at ankles.   Psychiatric:        Mood and Affect: Mood normal.        Behavior: Behavior normal.     Comments:          Lab Results Last 48 Hours  Results for orders placed or performed during the hospital encounter of 05/19/20 (from the past 48 hour(s))  Glucose, capillary     Status: Abnormal    Collection Time: 05/31/20  4:26 PM  Result Value Ref Range    Glucose-Capillary 219 (H) 70 - 99 mg/dL      Comment: Glucose reference range applies only to samples taken after fasting for at least 8 hours.  Glucose, capillary     Status: Abnormal    Collection Time: 05/31/20  8:41 PM  Result Value Ref Range    Glucose-Capillary 237 (H) 70 - 99 mg/dL      Comment: Glucose reference range applies only to samples taken after fasting for at least 8 hours.    Comment 1 Notify RN    Glucose, capillary     Status: Abnormal    Collection Time: 06/01/20  5:51 AM  Result Value Ref Range    Glucose-Capillary 197 (H) 70 - 99 mg/dL      Comment: Glucose reference range applies only to samples taken after fasting for at least 8 hours.  CBC     Status: Abnormal    Collection Time: 06/01/20  6:02 AM  Result Value Ref Range    WBC 11.9 (H) 4.0 -  10.5 K/uL    RBC 4.68 4.22 - 5.81 MIL/uL    Hemoglobin 13.7 13.0 - 17.0 g/dL    HCT 42.4 39.0 - 52.0 %    MCV 90.6 80.0 - 100.0 fL    MCH 29.3 26.0 - 34.0 pg    MCHC 32.3 30.0 - 36.0 g/dL    RDW 14.8 11.5 - 15.5 %    Platelets 366 150 - 400 K/uL    nRBC 0.0 0.0 - 0.2 %      Comment: Performed at [facility], [place] [address] [place], [place] [address]  Basic metabolic panel     Status: Abnormal    Collection Time: 06/01/20  6:02 AM  Result Value Ref Range    Sodium 135 135 - 145 mmol/L    Potassium 4.0 3.5 - 5.1 mmol/L    Chloride 104 98 - 111 mmol/L    CO2 24 22 - 32 mmol/L    Glucose, Bld 208 (H) 70 - 99 mg/dL      Comment: Glucose reference range applies only to samples taken after fasting for  at least 8 hours.    BUN 33 (H) 8 - 23 mg/dL    Creatinine, Ser 1.21 0.61 - 1.24 mg/dL    Calcium 8.8 (L) 8.9 - 10.3 mg/dL    GFR, Estimated >60 >60 mL/min      Comment: (NOTE) Calculated using the CKD-EPI Creatinine Equation (2021)      Anion gap 7 5 - 15      Comment: Performed at [facility] [address] [place], [place] [address]  Glucose, capillary     Status: Abnormal    Collection Time: 06/01/20 10:55 AM  Result Value Ref Range    Glucose-Capillary 215 (H) 70 - 99 mg/dL      Comment: Glucose reference range applies only to samples taken after fasting for at least 8 hours.  Glucose, capillary     Status: Abnormal    Collection Time: 06/01/20  3:37 PM  Result Value Ref Range    Glucose-Capillary 235 (H) 70 - 99 mg/dL      Comment: Glucose reference range applies only to samples taken after fasting for at least 8 hours.  Glucose, capillary     Status: Abnormal    Collection Time: 06/01/20 10:05 PM  Result Value Ref Range    Glucose-Capillary 281 (H) 70 - 99 mg/dL      Comment: Glucose reference range applies only to samples taken after fasting for at least 8 hours.  Glucose, capillary     Status: Abnormal    Collection Time: 06/02/20  6:24 AM  Result Value Ref Range    Glucose-Capillary 188 (H) 70 - 99 mg/dL      Comment: Glucose reference range applies only to samples taken after fasting for at least 8 hours.  Glucose, capillary     Status: Abnormal    Collection Time: 06/02/20 11:48 AM  Result Value Ref Range    Glucose-Capillary 212 (H) 70 - 99 mg/dL      Comment: Glucose reference range applies only to samples taken after fasting for at least 8 hours.      Imaging Results (Last 48 hours)  No results found.           Medical Problem List and Plan: 1.  Functional and mobility deficits secondary to VF cardiac arrest and subsequent debility             -patient  may shower             -ELOS/Goals: 9-12 days, Mod I to supervision goals with PT and OT 2.   Antithrombotics: -DVT/anticoagulation:  Pharmaceutical: Lovenox             -antiplatelet therapy: DAPT 3. Chronic pain/Pain Management: Lidocaine/volatren gel to LBP             --continue Neurontin bid and flexeril tid.              -voltaren gel to back and neck 4. Mood: LCSW to follow for evaluation and support.              -antipsychotic agents: N/A 5. Neuropsych: This patient is capable of making decisions on his own behalf. 6. Skin/Wound Care: Routine pressure relief measures.             -monitor incision sites, wounds  7. Fluids/Electrolytes/Nutrition: Monitor I/O. Check lytes in am.  8. 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 NO driving for 6 months.              --continue Coreg, ASA, Plavix and Lipitor.              --No heavy lifting or vigorous activity with the left arm for 6 to 8 weeks.  Do not raise your left arm above your head for one week.  Gradually raise your affected arm as drawn below             06/04/20                       06/05/20                     06/06/20                     06/07/20  9. T2DM: Monitor BS ac/hs. Continue Levemir 38 units bid w/ novolog  6 units tid and slowly titrate up to home dose as required.             --PTA was on Levemir 80 units HS with humalog 40/40/50 for meal coverage. 10. Abnormal LFTs: follow up labs in am.  11. Leucocytosis: Monitor for sign for fevers or other signs of infection.  12. Acute renal failure: Is slowly resolving with BUN/SCr 56/2.34-->33/1.21             ---Repeat BMET in am.              Bary Leriche, PA-C 06/02/2020

## 2020-06-02 NOTE — Progress Notes (Signed)
Meredith Staggers, MD  Physician  Physical Medicine and Rehabilitation  PMR Pre-admission     Signed  Date of Service:  06/02/2020 10:33 AM      Related encounter: ED to Hosp-Admission (Current) from 05/19/2020 in [facility]       Signed         Show:Clear all [x] Manual[x] Template[x] Copied  Added by: [x] Meredith Staggers, MD[x] Michel Santee, PT   [] Hover for details  PMR Admission Coordinator Pre-Admission Assessment  Patient: [name] is an 1 y.o., male MRN: [id number] DOB: 02-24-1955 Height: 5' 10.98" (180.3 cm) Weight: 127.4 kg (scale c)  Insurance Information HMO:     PPO:      PCP:      IPA:      80/20:      OTHER:  PRIMARY: Medicare A/B      Policy#: [id number]      Subscriber: pt CM Name:       Phone#:      Fax#:  Pre-Cert#: verified Civil engineer, contracting:  Benefits:  Phone #:      Name:  Eff. Date: A 06/03/96, B 07/04/11     Deduct: $1556      Out of Pocket Max: n/a      Life Max: n/a CIR: 100%      SNF: 20 full days Outpatient: 80%     Co-Pay: 20% Home Health: 100%      Co-Pay:  DME: 80%     Co-Pay: 20% Providers: pt choice   SECONDARY: VA      Policy#: [id number]     Phone#: [phone]  Financial Counselor:       Phone#:   The "Data Collection Information Summary" for patients in Inpatient Rehabilitation Facilities with attached "Privacy Act [place] Records" was provided and verbally reviewed with: Patient and Family  Emergency Contact Information         Contact Information    Name Relation Home Work Weston Spouse [phone]  [phone]      Current Medical History  Patient Admitting Diagnosis: cardiac arrest  History of Present Illness: Pt is a 65 y/o male with PMH of HTN, DM, chronic pain, R BBB, and L THA admitted to [facility] on 5/17 from outside hospital with witness cardiac arrest with immediate initiation of CPR by patient's wife and EMS shortly after.   Reportedly shocked multiple times by EMS.  CPR 49 minutes before ROSC.  Underlying rhythm VFib.  Post arrest EKG suggested possibility of interior MI.  Portable bedside echo showed inferobasal severe hypokinesis with septal bounce.  Systolic function negative, LVEF 40-50%. 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  LM, LAD and LCx all patent and no indication for PCI.  Pt had cardiogenic shock, requiring multiple pressors, electrolyte support, and steroids.  Pt was intubated 5/17 through 5/22.  Abnormal LFTs and AKI felt to be 2/2 to prolonged arrest.  Briefly on lasix drip for diuresis.  On 5/23 developed complete heart block with prolonged V pause (7 seconds), amio stopped and dobutamine resumed.  Pt underwent implantation of dual chamber ICD on 5/26.  Therapy ongoing and pt was recommended for CIR.    Patient's medical record from [facility] has been reviewed by the rehabilitation admission coordinator and physician.  Past Medical History      Past Medical History:  Diagnosis Date  . Adenomatous polyp   . Cataract  left eye for sure   . Chronic pain   . Diabetes mellitus   . GERD (gastroesophageal reflux disease)   . History of colonic polyps   . History of hip replacement, total   . Hx of anaphylactic shock   . Hyperlipidemia   . Hypertension   . Left anterior hemiblock   . Obesity   . RBBB (right bundle branch block with left anterior fascicular block)   . 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  Prior Rehab/Hospitalizations Has the patient had prior rehab or hospitalizations prior to admission? No  Has the patient had major surgery during 100 days prior to admission? Yes             Current Medications  Current Facility-Administered Medications:  .  0.9 %  sodium chloride  infusion, 250 mL, Intravenous, PRN, Vickie Epley, MD, Stopped at 05/25/20 1831 .  0.9 %  sodium chloride infusion, , Intravenous, Continuous, Vickie Epley, MD, Stopped at 05/28/20 1200 .  acetaminophen (TYLENOL) tablet 650 mg, 650 mg, Oral, Q4H PRN, Vickie Epley, MD, 650 mg at 05/31/20 1704 .  aspirin chewable tablet 81 mg, 81 mg, Oral, Daily, Vickie Epley, MD, 81 mg at 06/02/20 0806 .  atorvastatin (LIPITOR) tablet 80 mg, 80 mg, Oral, Daily, Vickie Epley, MD, 80 mg at 06/02/20 0805 .  carvedilol (COREG) tablet 3.125 mg, 3.125 mg, Oral, BID WC, Larey Dresser, MD, 3.125 mg at 06/02/20 0806 .  chlorhexidine gluconate (MEDLINE KIT) (PERIDEX) 0.12 % solution 15 mL, 15 mL, Mouth Rinse, BID, Vickie Epley, MD, 15 mL at 06/01/20 1003 .  Chlorhexidine Gluconate Cloth 2 % PADS 6 each, 6 each, Topical, Daily, Vickie Epley, MD, 6 each at 06/01/20 1632 .  clopidogrel (PLAVIX) tablet 75 mg, 75 mg, Oral, Daily, Clegg, Amy D, NP, 75 mg at 06/02/20 0806 .  cyclobenzaprine (FLEXERIL) tablet 5 mg, 5 mg, Oral, TID, Vickie Epley, MD, 5 mg at 06/02/20 0805 .  diclofenac Sodium (VOLTAREN) 1 % topical gel 4 g, 4 g, Topical, TID PRN, Vickie Epley, MD, 4 g at 05/29/20 2310 .  docusate sodium (COLACE) capsule 100 mg, 100 mg, Oral, BID, Vickie Epley, MD, 100 mg at 06/02/20 0805 .  gabapentin (NEURONTIN) capsule 200 mg, 200 mg, Oral, BID, Vickie Epley, MD, 200 mg at 06/02/20 0805 .  insulin aspart (novoLOG) injection 0-15 Units, 0-15 Units, Subcutaneous, TID WC, Nita Sells, MD, 3 Units at 06/02/20 0630 .  insulin aspart (novoLOG) injection 6 Units, 6 Units, Subcutaneous, TID WC, Nita Sells, MD, 6 Units at 06/02/20 0806 .  insulin detemir (LEVEMIR) injection 38 Units, 38 Units, Subcutaneous, BID, Nita Sells, MD, 38 Units at 06/02/20 0920 .  ketotifen (ZADITOR) 0.025 % ophthalmic solution 1 drop, 1 drop, Both Eyes, TID PRN, Vickie Epley, MD, 1 drop at 05/25/20 2020 .  lip balm (BLISTEX) ointment, , Topical, PRN, Vickie Epley, MD .  MEDLINE mouth rinse, 15 mL, Mouth Rinse, BID, Vickie Epley, MD, 15 mL at 06/02/20 0807 .  multivitamin with minerals tablet 1 tablet, 1 tablet, Oral, Daily, Vickie Epley, MD, 1 tablet at 06/02/20 0805 .  nitroGLYCERIN (NITROSTAT) SL tablet 0.4 mg, 0.4 mg, Sublingual, Q5 Min x 3 PRN, Lars Mage  T, MD .  ondansetron (ZOFRAN) injection 4 mg, 4 mg, Intravenous, Q6H PRN, Vickie Epley, MD, 4 mg at 05/28/20 1123 .  oxyCODONE (Oxy IR/ROXICODONE) immediate release tablet 5-10 mg, 5-10 mg, Oral, Q4H PRN, Vickie Epley, MD, 10 mg at 06/02/20 1624 .  pantoprazole (PROTONIX) EC tablet 40 mg, 40 mg, Oral, BID AC, Vickie Epley, MD, 40 mg at 06/02/20 4695 .  polyvinyl alcohol (LIQUIFILM TEARS) 1.4 % ophthalmic solution 1 drop, 1 drop, Both Eyes, PRN, Vickie Epley, MD, 1 drop at 05/26/20 0146 .  potassium chloride SA (KLOR-CON) CR tablet 20 mEq, 20 mEq, Oral, Daily, Vickie Epley, MD, 20 mEq at 06/02/20 0805 .  sodium chloride flush (NS) 0.9 % injection 10-40 mL, 10-40 mL, Intracatheter, PRN, Samtani, Jai-Gurmukh, MD .  sodium chloride flush (NS) 0.9 % injection 3 mL, 3 mL, Intravenous, Q12H, Vickie Epley, MD, 3 mL at 05/27/20 2305 .  sodium chloride flush (NS) 0.9 % injection 3 mL, 3 mL, Intravenous, PRN, Lars Mage T, MD .  sodium chloride flush (NS) 0.9 % injection 3 mL, 3 mL, Intravenous, Q12H, Vickie Epley, MD, 3 mL at 05/29/20 0005 .  sodium chloride flush (NS) 0.9 % injection 3 mL, 3 mL, Intravenous, PRN, Lars Mage T, MD .  sodium chloride flush (NS) 0.9 % injection 3 mL, 3 mL, Intravenous, Q12H, Vickie Epley, MD, 3 mL at 06/02/20 0808  Patients Current Diet:     Diet Order                  Diet - low sodium heart healthy            Diet Heart Room service appropriate? Yes; Fluid consistency: Thin  Diet  effective now                  Precautions / Restrictions Precautions Precautions: Fall,ICD/Pacemaker Precaution Comments: flow track, art line Restrictions Weight Bearing Restrictions: Yes Other Position/Activity Restrictions: pacemaker precautions   Has the patient had 2 or more falls or a fall with injury in the past year? 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 Independent  Indoor Mobility: Did the patient need assistance with walking from room to room (with or without device)? Independent  Stairs: Did the patient need assistance with internal or external stairs (with or without device)? Independent  Functional Cognition: Did the patient need help planning regular tasks such as shopping or remembering to take medications? Independent  Home Assistive Devices / Equipment Home Assistive Devices/Equipment: None Home Equipment: Walker - 2 wheels,Bedside commode  Prior Device Use: Indicate devices/aids used by the patient prior to current illness, exacerbation or injury? None of the above  Current Functional Level Cognition  Overall Cognitive Status: Within Functional Limits for tasks assessed Orientation Level: Oriented X4 General Comments: reports feeling sleepy and tired today. "the doctors told me I could take it easy today."    Extremity Assessment (includes Sensation/Coordination)  Upper Extremity Assessment: Generalized weakness  Lower Extremity Assessment: Defer to PT evaluation    ADLs  Overall ADL's : Needs assistance/impaired Eating/Feeding Details (indicate cue type and reason): NPO but with strength and AROM WNL for self feeding Grooming: Wash/dry hands,Wash/dry face,Set up,Bed level Upper Body Bathing: Minimal assistance,Bed level Lower Body Bathing: Maximal assistance,Bed level Upper  Body Dressing : Minimal assistance,Bed level  Upper Body Dressing Details (indicate cue type and reason): education provided on compensatory methods for UB dressing to pacer precautions Lower Body Dressing: Total assistance,Bed level Toilet Transfer: Min guard,Minimal assistance,RW,Ambulation,+2 for safety/equipment Toilet Transfer Details (indicate cue type and reason): simulated via functional mobility with RW and +2 assist with chair follow Toileting- Clothing Manipulation and Hygiene: Total assistance,Bed level Functional mobility during ADLs: Min guard,Minimal assistance,+2 for safety/equipment,Rolling walker General ADL Comments: education provide ADL participation with ICD precautions    Mobility  Overal bed mobility: Needs Assistance Bed Mobility: Supine to Sit Rolling: Mod assist Supine to sit: HOB elevated,Mod assist Sit to supine: Mod assist,+2 for physical assistance General bed mobility comments: pt OOB in recliner upon arrival and returned pt to recliner at end of session    Transfers  Overall transfer level: Needs assistance Equipment used: Rolling walker (2 wheeled) Transfers: Sit to/from Stand Sit to Stand: Min guard,+2 safety/equipment Stand pivot transfers: Min assist General transfer comment: cues for hand placement for sit<>stand, min guard for safety to rise from recliner, vc's to not put significant wt through LUE    Ambulation / Gait / Stairs / Wheelchair Mobility  Ambulation/Gait Ambulation/Gait assistance: Min assist,+2 safety/equipment Gait Distance (Feet): 80 Feet Assistive device: Rolling walker (2 wheeled) Gait Pattern/deviations: Wide base of support General Gait Details: needed 3 standing rest breaks. Chair brought behind as pt was unsteady and knees buckling last 5'. Pt needed cues to self monitor Gait velocity: decreased Gait velocity interpretation: <1.31 ft/sec, indicative of household ambulator    Posture / Balance Dynamic Sitting  Balance Sitting balance - Comments: supervision for safety. Balance Overall balance assessment: Needs assistance Sitting-balance support: Feet unsupported,No upper extremity supported Sitting balance-Leahy Scale: Fair Sitting balance - Comments: supervision for safety. Standing balance support: During functional activity Standing balance-Leahy Scale: Poor Standing balance comment: requries BUE support    Special needs/care consideration Diabetic management yes   Previous Home Environment (from acute therapy documentation) Living Arrangements: Spouse/significant other Available Help at Discharge: Family,Available 24 hours/day Type of Home: House Home Layout: One level Home Access: Stairs to enter [employer] of Steps: 1 Bathroom Shower/Tub: Chiropodist: [place]: No  Discharge Living Setting Plans for Discharge Living Setting: Patient's home,Lives with (comment) (wife) Type of Home at Discharge: House Discharge Home Layout: One level Discharge Home Access: Stairs to enter Entrance Stairs-Rails: None Entrance Stairs-Number of Steps: 1 Discharge Bathroom Shower/Tub: Tub/shower unit Discharge Bathroom Toilet: Standard Discharge Bathroom Accessibility: Yes How Accessible: Accessible via walker Does the patient have any problems obtaining your medications?: No  Social/Family/Support Systems Patient Roles: Spouse Anticipated Caregiver: Arville Go (spouse) Anticipated Caregiver's Contact Information: [phone] Ability/Limitations of Caregiver: min assist Caregiver Availability: 24/7 Discharge Plan Discussed with Primary Caregiver: Yes Is Caregiver In Agreement with Plan?: Yes Does Caregiver/Family have Issues with Lodging/Transportation while Pt is in Rehab?: No  Goals Patient/Family Goal for Rehab: PT/OT supervision to mod I, SLP n/a Expected length of stay: 9-12 days Pt/Family Agrees to Admission and willing to  participate: Yes Program Orientation Provided & Reviewed with Pt/Caregiver Including Roles  & Responsibilities: Yes  Decrease burden of Care through IP rehab admission: n/a  Possible need for SNF placement upon discharge: not anticipated  Patient Condition: I have reviewed medical records from [facility], spoken with CM, and patient and spouse. I met with patient at the bedside for inpatient rehabilitation assessment.  Patient will benefit from ongoing PT and OT, can actively participate in 3 hours of  therapy a day 5 days of the week, and can make measurable gains during the admission.  Patient will also benefit from the coordinated team approach during an Inpatient Acute Rehabilitation admission.  The patient will receive intensive therapy as well as Rehabilitation physician, nursing, social worker, and care management interventions.  Due to safety, disease management, medication administration, pain management and patient education the patient requires 24 hour a day rehabilitation nursing.  The patient is currently min assist with mobility and basic ADLs.  Discharge setting and therapy post discharge at home with home health is anticipated.  Patient has agreed to participate in the Acute Inpatient Rehabilitation Program and will admit today.  Preadmission Screen Completed By:  Michel Santee, PT, DPT 06/02/2020 10:34 AM ______________________________________________________________________   Discussed status with Dr. Naaman Plummer on 06/02/20  at 10:59 AM  and received approval for admission today.  Admission Coordinator:  Michel Santee, PT, DPT time 10:59 AM Sudie Grumbling 06/02/20    Assessment/Plan: Diagnosis:debility after cardiac arrest 1. Does the need for close, 24 hr/day Medical supervision in concert with the patient's rehab needs make it unreasonable for this patient to be served in a less intensive setting? Yes 2. Co-Morbidities requiring supervision/potential complications: HTN,  DM 3. Due to bladder management, bowel management, safety, skin/wound care, disease management, medication administration, pain management and patient education, does the patient require 24 hr/day rehab nursing? Yes 4. Does the patient require coordinated care of a physician, rehab nurse, PT, OT, and SLP to address physical and functional deficits in the context of the above medical diagnosis(es)? Yes Addressing deficits in the following areas: balance, endurance, locomotion, strength, transferring, bowel/bladder control, bathing, dressing, feeding, grooming, toileting and psychosocial support 5. Can the patient actively participate in an intensive therapy program of at least 3 hrs of therapy 5 days a week? Yes 6. The potential for patient to make measurable gains while on inpatient rehab is excellent 7. Anticipated functional outcomes upon discharge from inpatient rehab: modified independent and supervision PT, modified independent and supervision OT, n/a SLP 8. Estimated rehab length of stay to reach the above functional goals is: 9-12 days 9. Anticipated discharge destination: Home 10. Overall Rehab/Functional Prognosis: excellent   MD Signature: Meredith Staggers, MD, [place] [facility] 06/02/2020           Revision History                        Note Details  Author Meredith Staggers, MD File Time 06/02/2020 11:03 AM  Author Type Physician Status Signed  Last Editor Meredith Staggers, MD Service Physical Medicine and Rehabilitation

## 2020-06-02 NOTE — Progress Notes (Signed)
Patient admitted to IP Rehab this afternoon. No complaints of pain when lying still, call light and personal belongings within reach. [name]

## 2020-06-03 DIAGNOSIS — E8809 Other disorders of plasma-protein metabolism, not elsewhere classified: Secondary | ICD-10-CM

## 2020-06-03 DIAGNOSIS — E46 Unspecified protein-calorie malnutrition: Secondary | ICD-10-CM

## 2020-06-03 DIAGNOSIS — G894 Chronic pain syndrome: Secondary | ICD-10-CM

## 2020-06-03 DIAGNOSIS — R5381 Other malaise: Principal | ICD-10-CM

## 2020-06-03 DIAGNOSIS — E1165 Type 2 diabetes mellitus with hyperglycemia: Secondary | ICD-10-CM

## 2020-06-03 DIAGNOSIS — Z794 Long term (current) use of insulin: Secondary | ICD-10-CM

## 2020-06-03 DIAGNOSIS — D62 Acute posthemorrhagic anemia: Secondary | ICD-10-CM

## 2020-06-03 LAB — COMPREHENSIVE METABOLIC PANEL
ALT: 35 U/L (ref 0–44)
AST: 23 U/L (ref 15–41)
Albumin: 2.7 g/dL — ABNORMAL LOW (ref 3.5–5.0)
Alkaline Phosphatase: 103 U/L (ref 38–126)
Anion gap: 9 (ref 5–15)
BUN: 26 mg/dL — ABNORMAL HIGH (ref 8–23)
CO2: 26 mmol/L (ref 22–32)
Calcium: 8.6 mg/dL — ABNORMAL LOW (ref 8.9–10.3)
Chloride: 101 mmol/L (ref 98–111)
Creatinine, Ser: 1.17 mg/dL (ref 0.61–1.24)
GFR, Estimated: >60 mL/min (ref >60)
Glucose, Bld: 192 mg/dL — ABNORMAL HIGH (ref 70–99)
Potassium: 4 mmol/L (ref 3.5–5.1)
Sodium: 136 mmol/L (ref 135–145)
Total Bilirubin: 0.8 mg/dL (ref 0.3–1.2)
Total Protein: 6.2 g/dL — ABNORMAL LOW (ref 6.5–8.1)

## 2020-06-03 LAB — CBC WITH DIFFERENTIAL/PLATELET
Abs Immature Granulocytes: 0.08 10*3/uL — ABNORMAL HIGH (ref 0.00–0.07)
Basophils Absolute: 0.1 10*3/uL (ref 0.0–0.1)
Basophils Relative: 1 %
Eosinophils Absolute: 0.3 10*3/uL (ref 0.0–0.5)
Eosinophils Relative: 4 %
HCT: 37.2 % — ABNORMAL LOW (ref 39.0–52.0)
Hemoglobin: 11.9 g/dL — ABNORMAL LOW (ref 13.0–17.0)
Immature Granulocytes: 1 %
Lymphocytes Relative: 26 %
Lymphs Abs: 1.7 10*3/uL (ref 0.7–4.0)
MCH: 28.8 pg (ref 26.0–34.0)
MCHC: 32 g/dL (ref 30.0–36.0)
MCV: 90.1 fL (ref 80.0–100.0)
Monocytes Absolute: 0.7 10*3/uL (ref 0.1–1.0)
Monocytes Relative: 10 %
Neutro Abs: 3.8 10*3/uL (ref 1.7–7.7)
Neutrophils Relative %: 58 %
Platelets: 283 10*3/uL (ref 150–400)
RBC: 4.13 MIL/uL — ABNORMAL LOW (ref 4.22–5.81)
RDW: 14.8 % (ref 11.5–15.5)
WBC: 6.6 10*3/uL (ref 4.0–10.5)
nRBC: 0 % (ref 0.0–0.2)

## 2020-06-03 LAB — GLUCOSE, CAPILLARY
Glucose-Capillary: 157 mg/dL — ABNORMAL HIGH (ref 70–99)
Glucose-Capillary: 278 mg/dL — ABNORMAL HIGH (ref 70–99)
Glucose-Capillary: 228 mg/dL — ABNORMAL HIGH (ref 70–99)
Glucose-Capillary: 253 mg/dL — ABNORMAL HIGH (ref 70–99)

## 2020-06-03 MED ORDER — EMPAGLIFLOZIN 10 MG PO TABS
10.0000 mg | ORAL_TABLET | Freq: Every day | ORAL | Status: DC
  Administered 2020-06-03 – 2020-06-10 (×8): 10 mg via ORAL
  Filled 2020-06-03 (×8): qty 1

## 2020-06-03 MED ORDER — PROSOURCE PLUS PO LIQD
30.0000 mL | Freq: Two times a day (BID) | ORAL | Status: DC
  Administered 2020-06-03 – 2020-06-05 (×6): 30 mL via ORAL
  Filled 2020-06-03 (×9): qty 30

## 2020-06-03 NOTE — Progress Notes (Signed)
[facility] Individual Statement of Services  Patient Name:  [name]  Date:  06/03/2020  Welcome to the [facility].  Our goal is to provide you with an individualized program based on your diagnosis and situation, designed to meet your specific needs.  With this comprehensive rehabilitation program, you will be expected to participate in at least 3 hours of rehabilitation therapies Monday-Friday, with modified therapy programming on the weekends.  Your rehabilitation program will include the following services:  Physical Therapy (PT), Occupational Therapy (OT), 24 hour per day rehabilitation nursing, Care Coordinator, Rehabilitation Medicine, Nutrition Services and Pharmacy Services  Weekly team conferences will be held on Wednesday to discuss your progress.  Your Inpatient Rehabilitation Care Coordinator will talk with you frequently to get your input and to update you on team discussions.  Team conferences with you and your family in attendance may also be held.  Expected length of stay: 7 days  Overall anticipated outcome: mod/i level  Depending on your progress and recovery, your program may change. Your Inpatient Rehabilitation Care Coordinator will coordinate services and will keep you informed of any changes. Your Inpatient Rehabilitation Care Coordinator's name and contact numbers are listed  below.  The following services may also be recommended but are not provided by the [facility] will be made to provide these services after discharge if needed.  Arrangements include referral to agencies that provide these services.  Show:  Your insurance has been verified to be:  Medicare A & B and VA Your primary doctor is:  [facility]  Pertinent information will be shared with your doctor an Date of  Service: 06/03/2020 9:47 AM          Signed      Show:Clear all    d your insurance company.  Inpatient Rehabilitation Care Coordinator:  Ovidio Kin, [place] or Emilia Beck  Information discussed with and copy given to patient by: Elease Hashimoto, 06/03/2020, 9:49 AM

## 2020-06-03 NOTE — Patient Care Conference (Signed)
Inpatient RehabilitationTeam Conference and Plan of Care Update Date: 06/03/2020   Time: 11:09 AM    Patient Name: [name]      Medical Record Number: [id number]  Date of Birth: Dec 08, 1955 Sex: Male         Room/Bed: 4M06C/4M06C-01 Payor Info: Payor: MEDICARE / Plan: MEDICARE PART A AND B / Product Type: *No Product type* /    Admit Date/Time:  06/02/2020  3:57 PM  Primary Diagnosis:  Physical debility  Hospital Problems: Principal Problem:   Physical debility Active Problems:   Hypoalbuminemia due to protein-calorie malnutrition (HCC)   Acute blood loss anemia   Controlled type 2 diabetes mellitus with hyperglycemia, with long-term current use of insulin (HCC)   Chronic pain syndrome    Expected Discharge Date: Expected Discharge Date:  (Evals pending)  Team Members Present: Physician leading conference: Dr. Delice Lesch Care Coodinator Present: Dorien Chihuahua, RN, BSN, CRRN;Becky Dupree, LCSW Nurse Present: Dorien Chihuahua, RN PT Present: Page Spiro, PT OT Present: Meriel Pica, OT PPS Coordinator present : Gunnar Fusi, SLP     Current Status/Progress Goal Weekly Team Focus  Bowel/Bladder   Patient is continent of bladder and bowel last bm 5/30  Patient will remain continent x2  Assess qshift and prn   Swallow/Nutrition/ Hydration             ADL's             Mobility   eval pending         Communication             Safety/Cognition/ Behavioral Observations            Pain   Denies pain  Patient will remain pain free  Assess and treat qshift and prn   Skin   Skin intact except for excoriation to left chest.  Skin will remain injury free  Assess qshift and prn     Discharge Planning:  new evaluation-home with wife who can assist him, will confirm today   Team Discussion: Patient is a little irritable today and notes pain in left chest near new ICD site. Bil LE edema. Patient having difficulty with FR; likes to drink a lot of water. Reviewed fluid  limits and options for dry mouth.  Patient on target to meet rehab goals: Evals pending  *See Care Plan and progress notes for long and short-term goals.   Revisions to Treatment Plan:   Teaching Needs: Transfers, toileting, sternal precautions/post Defibrillator placement limitations, medication management, fluid restrictions and Zone tool for HF management. Etc.   Current Barriers to Discharge: Decreased caregiver support, Home enviroment access/layout and Weight bearing restrictions  Possible Resolutions to Barriers: Family education     Medical Summary Current Status: Functional and mobility deficits secondary to VF cardiac arrest and subsequent debility  Barriers to Discharge: Medical stability;Weight   Possible Resolutions to [employer] Focus: Therapies, optimize DM meds, follow labs - Cr, Hb   Continued Need for Acute Rehabilitation Level of Care: The patient requires daily medical management by a physician with specialized training in physical medicine and rehabilitation for the following reasons: Direction of a multidisciplinary physical rehabilitation program to maximize functional independence : Yes Medical management of patient stability for increased activity during participation in an intensive rehabilitation regime.: Yes Analysis of laboratory values and/or radiology reports with any subsequent need for medication adjustment and/or medical intervention. : Yes   I attest that I was present, lead the team conference, and concur with  the assessment and plan of the team.   Margarito Liner 06/03/2020, 3:26 PM

## 2020-06-03 NOTE — Progress Notes (Signed)
[facility] PHYSICAL MEDICINE & REHABILITATION PROGRESS NOTE  Subjective/Complaints: Patient seen sitting up in a chair this morning.  He states he slept fairly overnight.  He is ready to work with therapies.  ROS: + MSK chest pain, shortness of breath, numbness in feet.    Objective: Vital Signs: Blood pressure (!) 158/65, pulse 74, temperature 97.9 F (36.6 C), resp. rate 17, height 6' (1.829 m), weight 130 kg, SpO2 97 %. No results found. Recent Labs    06/01/20 0602 06/03/20 0525  WBC 11.9* 6.6  HGB 13.7 11.9*  HCT 42.4 37.2*  PLT 366 283   Recent Labs    06/01/20 0602 06/03/20 0525  NA 135 136  K 4.0 4.0  CL 104 101  CO2 24 26  GLUCOSE 208* 192*  BUN 33* 26*  CREATININE 1.21 1.17  CALCIUM 8.8* 8.6*    Intake/Output Summary (Last 24 hours) at 06/03/2020 0959 Last data filed at 06/03/2020 0923 Gross per 24 hour  Intake 177 ml  Output 1675 ml  Net -1498 ml        Physical Exam: BP (!) 158/65   Pulse 74   Temp 97.9 F (36.6 C)   Resp 17   Ht 6' (1.829 m)   Wt 130 kg   SpO2 97%   BMI 38.87 kg/m  Constitutional: No distress . Vital signs reviewed.  Morbidly obese. HENT: Normocephalic.  Atraumatic. Eyes: EOMI. No discharge. Cardiovascular: No JVD.  RRR. Respiratory: Normal effort.  No stridor.  Bilateral clear to auscultation. GI: Non-distended.  BS +. Skin: Warm and dry.  Intact. Psych: Slightly irritable.  Normal behavior. Musc: No edema in extremities.  Bilateral knee tenderness. Neuro: Alert Sensation diminished to light touch distal to mid calf bilaterally Motor: Bilateral upper extremities: 5/5 proximal distal Bilateral lower extremity: Flexion, knee extension 4/5, ankle dorsiflexion 4+/5  Assessment/Plan: 1. Functional deficits which require 3+ hours per day of interdisciplinary therapy in a comprehensive inpatient rehab setting.  Physiatrist is providing close team supervision and 24 hour management of active medical problems listed  below.  Physiatrist and rehab team continue to assess barriers to discharge/monitor patient progress toward functional and medical goals   Care Tool:  Bathing    Body parts bathed by patient: Right arm,Left arm,Chest,Front perineal area,Abdomen,Buttocks,Right upper leg,Left upper leg,Face         Bathing assist Assist Level: Minimal Assistance - Patient > 75%     Upper Body Dressing/Undressing Upper body dressing   What is the patient wearing?: Pull over shirt    Upper body assist Assist Level: Supervision/Verbal cueing    Lower Body Dressing/Undressing Lower body dressing      What is the patient wearing?: Pants,Underwear/pull up     Lower body assist Assist for lower body dressing: Minimal Assistance - Patient > 75%     Toileting Toileting    Toileting assist Assist for toileting: Minimal Assistance - Patient > 75% (posterior hygiene)     Transfers Chair/bed transfer  Transfers assist     Chair/bed transfer assist level: Contact Guard/Touching assist     Locomotion Ambulation   Ambulation assist              Walk 10 feet activity   Assist           Walk 50 feet activity   Assist           Walk 150 feet activity   Assist  Walk 10 feet on uneven surface  activity   Assist           Wheelchair     Assist               Wheelchair 50 feet with 2 turns activity    Assist            Wheelchair 150 feet activity     Assist           Medical Problem List and Plan: 1.Functional and mobility deficitssecondary to VF cardiac arrest and subsequent debility  Begin CIR evaluations  Team conference today to discuss current and goals and coordination of care, home and environmental barriers, and discharge planning with nursing, case manager, and therapies. Please see conference note from today as well.  2.  Antithrombotics: -DVT/anticoagulation:Pharmaceutical:Lovenox -antiplatelet therapy: DAPT 3.Chronic pain/Pain Management:Lidocaine/volatren gel to LBP --continue Neurontin bid and flexeril tid.  -voltaren gel to back and neck  Monitor with increased exertion 4. Mood:LCSW to follow for evaluation and support. -antipsychotic agents: N/A 5. Neuropsych: This patientiscapable of making decisions on hisown behalf. 6. Skin/Wound Care:Routine pressure relief measures. -monitor incision sites, wounds 7. Fluids/Electrolytes/Nutrition:Monitor I/Os 8. 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 NO driving for 6 months.  --continue Coreg, ASA, Plavix and Lipitor.  --No heavy lifting or vigorous activity withtheleft arm for 6 to 8 weeks. Do not raise your left arm above your head for one week.  9. T2DM with hyperglycemia: Monitor BS ac/hs. Continue Levemir 38 units bid w/ novolog 6 units tid and slowly titrate up to home dose as required. --PTA was on Levemir 80 units HS with humalog 40/40/50 for meal coverage.  Discussed with pharmacy, home medication restarted  Monitor with increased activity 10. Abnormal LFTs: Resolved 11. Leucocytosis: Resolved  12. Acute kidney injury  Creatinine 1.17 on 6/1  Continue to monitor 13.  Acute blood loss anemia  Hemoglobin 11.9 on 6/1  Continue to monitor 14.  Hypoalbuminemia  Supplement initiated on 6/1  LOS: 1 days A FACE TO FACE EVALUATION WAS PERFORMED  Sadeen Wiegel Lorie Phenix 06/03/2020, 9:59 AM

## 2020-06-03 NOTE — Progress Notes (Signed)
Inpatient Rehabilitation Care Coordinator Assessment and Plan Patient Details  Name: [name] MRN: [id number] Date of Birth: April 03, 1955  Today's Date: 06/03/2020  Hospital Problems: Principal Problem:   Physical debility  Past Medical History:  Past Medical History:  Diagnosis Date  . Adenomatous polyp   . Cataract    left eye for sure   . Chronic pain   . Diabetes mellitus   . GERD (gastroesophageal reflux disease)   . History of colonic polyps   . History of hip replacement, total   . Hx of anaphylactic shock   . Hyperlipidemia   . Hypertension   . Left anterior hemiblock   . Obesity   . RBBB (right bundle branch block with left anterior fascicular block)   . 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 APPENDECTOMY    . CERVICAL FUSION    . CERVICAL LAMINECTOMY    . COLONOSCOPY    . CORONARY/GRAFT ACUTE MI REVASCULARIZATION N/A 05/19/2020   Procedure: Coronary/Graft Acute MI Revascularization;  Surgeon: Belva Crome, MD;  Location: [facility];  Service: Cardiovascular;  Laterality: N/A;  . HAND SURGERY    . HARDWARE REMOVAL Left 09/10/2012   Procedure: HARDWARE REMOVAL;  Surgeon: Jessy Oto, MD;  Location: [facility];  Service: Orthopedics;  Laterality: Left;  . HIP SURGERY  12/29/09   R THR  ([facility])  . HIP SURGERY     bilateral  . ICD IMPLANT N/A 05/28/2020   Procedure: ICD IMPLANT;  Surgeon: Vickie Epley, MD;  Location: [facility];  Service: Cardiovascular;  Laterality: N/A;  . LEFT HEART CATH AND CORONARY ANGIOGRAPHY N/A 05/19/2020   Procedure: LEFT HEART CATH AND CORONARY ANGIOGRAPHY;  Surgeon: Belva Crome, MD;  Location: [facility];  Service: Cardiovascular;  Laterality: N/A;  . TOTAL HIP ARTHROPLASTY Left 09/10/2012   Procedure: REMOVAL OF HARDWARE LEFT PROXIMAL THIGH/FEMUR AND LEFT TOTAL HIP ARTHROPLASTY;  Surgeon: Jessy Oto, MD;   Location: [facility];  Service: Orthopedics;  Laterality: Left;  . TOTAL KNEE ARTHROPLASTY  01/2008   left knee   Social History:  reports that he quit smoking about 6 years ago. His smoking use included cigarettes. He has a 63.00 pack-year smoking history. He has quit using smokeless tobacco.  His smokeless tobacco use included chew. He reports that he does not drink alcohol and does not use drugs.  Family / Support Systems Marital Status: Married Patient Roles: Spouse,Other (Comment) Spouse/Significant Other: Joann [phone] Other Supports: extended family who are supportive and will check on him. They have friend also who will come by Anticipated Caregiver: Joann Ability/Limitations of Caregiver: supervision-min level-works from home Caregiver Availability: 24/7 Family Dynamics: Close with family and friends, he feels he has good supports and has no concerns regarding this.  Social History Preferred language: English Religion: Baptist Cultural Background: No issues Education: Retired EMT Read: Yes Write: Yes Employment Status: Retired Public relations account executive Issues: No issues Guardian/Conservator: None-according to MD pt is capable of making his own decisions while here. Wife will be here in the evenings daily   Abuse/Neglect Abuse/Neglect Assessment Can Be Completed: Yes Physical Abuse: Denies Verbal Abuse: Denies Sexual Abuse: Denies Exploitation of patient/patient's resources: Denies Self-Neglect: Denies  Emotional Status Pt's affect, behavior and adjustment status: Pt is motivated to improve, not happy had to wait so long for insurance to approve, but  hopes first day is good. He wants to do well here and go home soon. He has always been independent and wants to get back to this level. Recent Psychosocial Issues: other health issues thought were managed prior to this episode Psychiatric History: No history deferred depression screening due to coping appropriately and  glad to be alive since he died and medical team brought him back. Substance Abuse History: No issues-quit tobacco years ago  Patient / Family Perceptions, Expectations & Goals Pt/Family understanding of illness & functional limitations: Pt and wife can explain his hospitalization and procedures while he has been here. Both talk with the MD and feel they have a good understanding of his treatment plan going forward. Premorbid pt/family roles/activities: husband, retiree, friend, etc Anticipated changes in roles/activities/participation: resume Pt/family expectations/goals: Pt states: " I hope to get home soon, but want to do for myself before I leave here."  Wife states: " I can assist but know how stubborn he is and he wants to do himself."  [employer]: Other (Comment) [facility] Premorbid Home Care/DME Agencies: Other (Comment) (has rw and bsc from past hip surgery) Transportation available at discharge: pt drove PTA-wife drives  Discharge Planning Living Arrangements: Spouse/significant other Support Systems: Spouse/significant other,Other relatives,Friends/neighbors Type of Residence: Private residence Insurance Resources: [employer] (specify) ([facility]) Financial Resources: Therapist, art Screen Referred: No Living Expenses: Lives with family Money Management: Patient,Spouse Does the patient have any problems obtaining your medications?: No Home Management: Wife and pt Patient/Family Preliminary Plans: Return home with wife who works from home and can assist if needed. Pt is hopeful she will not need too. He feels his main issue is his strength and knee stability. Aware of team conference and may not have target DC date today due to being evaluated. Care Coordinator Anticipated Follow Up Needs: HH/OP  Clinical Impression Pleasant yet somewhat cranky regarding not sleeping well and wife needing to  work due to on rehab now, when on acute wife could be here with him. Being evaluated today and goals set, will update once have information. Work on Dc needs.  Elease Hashimoto 06/03/2020, 9:47 AM

## 2020-06-03 NOTE — Evaluation (Signed)
Physical Therapy Assessment and Plan  Patient Details  Name: [name] MRN: [id number] Date of Birth: [birth year]  PT Diagnosis: Abnormality of gait Rehab Potential: Good ELOS: 7 days   Today's Date: 06/03/2020 PT Individual Time: 1100-1200 PT Individual Time Calculation (min): 60 min    Hospital Problem: Principal Problem:   Physical debility Active Problems:   Hypoalbuminemia due to protein-calorie malnutrition ([place])   Acute blood loss anemia   Controlled type 2 diabetes mellitus with hyperglycemia, with long-term current use of insulin (HCC)   Chronic pain syndrome   Past Medical History:  Past Medical History:  Diagnosis Date  . Adenomatous polyp   . Cataract    left eye for sure   . Chronic pain   . Diabetes mellitus   . GERD (gastroesophageal reflux disease)   . History of colonic polyps   . History of hip replacement, total   . Hx of anaphylactic shock   . Hyperlipidemia   . Hypertension   . Left anterior hemiblock   . Obesity   . RBBB (right bundle branch block with left anterior fascicular block)   . 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 APPENDECTOMY    . CERVICAL FUSION    . CERVICAL LAMINECTOMY    . COLONOSCOPY    . CORONARY/GRAFT ACUTE MI REVASCULARIZATION N/A 05/19/2020   Procedure: Coronary/Graft Acute MI Revascularization;  Surgeon: Belva Crome, MD;  Location: [facility] [place] [facility];  Service: Cardiovascular;  Laterality: N/A;  . HAND SURGERY    . HARDWARE REMOVAL Left 09/10/2012   Procedure: HARDWARE REMOVAL;  Surgeon: Jessy Oto, MD;  Location: [facility];  Service: Orthopedics;  Laterality: Left;  . HIP SURGERY  12/29/09   R THR  ([place])  . HIP SURGERY     bilateral  . ICD IMPLANT N/A 05/28/2020   Procedure: ICD IMPLANT;  Surgeon: Vickie Epley, MD;  Location: [facility];  Service: Cardiovascular;  Laterality: N/A;  . LEFT HEART CATH AND  CORONARY ANGIOGRAPHY N/A 05/19/2020   Procedure: LEFT HEART CATH AND CORONARY ANGIOGRAPHY;  Surgeon: Belva Crome, MD;  Location: [facility];  Service: Cardiovascular;  Laterality: N/A;  . TOTAL HIP ARTHROPLASTY Left 09/10/2012   Procedure: REMOVAL OF HARDWARE LEFT PROXIMAL THIGH/FEMUR AND LEFT TOTAL HIP ARTHROPLASTY;  Surgeon: Jessy Oto, MD;  Location: [facility];  Service: Orthopedics;  Laterality: Left;  . TOTAL KNEE ARTHROPLASTY  01/2008   left knee    Assessment & Plan Clinical Impression:[name] is a 65 year old male with history of HTN, RBBB, T2DM, chronic LBP who was admitted on 05/19/20 via [facility] with reports of chest pain followed by VF cardiac arrest w/30 minutes CPR prior to ROSC. 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 He was started on amiodarone drip and cardiogenic shock felt to be due to septic shock from aspiration PNA. He was treated with IV antibiotics as well as multiple pressors and required precedex to manage agitation. He tolerated extubation and  AKI with shocked liver felt to be due to prolonged arrest and fluid overload improved with lasix drip. He had recurrent 7 second CHB on 05/23 requiring dobutamine and amiodarone d/c. Repeat limited echo showed normal RV function and size with no wall abnormality. He  underwent dual chamber ICD implantation by Dr. Quentin Ore on 05/26 with recommendations to hold Plavix for 3 days and NO driving X 6 months.   Thrombocytopenia is resolving. Po intake improving, prinzide on hold due to soft BP and and  therapy has been ongoing and patient continues to be limited by chest wall pain, LBP, weakness and balance deficits  Patient transferred to CIR on 06/02/2020 .   Patient currently requires min with mobility secondary to muscle weakness, decreased cardiorespiratoy endurance and decreased standing balance and  decreased balance strategies.  Prior to hospitalization, patient was independent  with mobility and lived with Spouse in a House home.  Home access is 1Stairs to enter.  Patient will benefit from skilled PT intervention to maximize safe functional mobility, minimize fall risk and decrease caregiver burden for planned discharge home with 24 hour assist.  Anticipate patient will benefit from continued skilled PT, setting TBD at discharge.  PT - End of Session Activity Tolerance: Tolerates 30+ min activity with multiple rests Endurance Deficit: Yes Endurance Deficit Description: pt required frequent rest breaks due to fatigue, maintains 02 sats >90% on RA w/activity, Dyspnea w/exertion w/prolonged recovery needed PT Assessment Rehab Potential (ACUTE/IP ONLY): Good PT Barriers to Discharge: Weight;Other (comments) PT Barriers to Discharge Comments: Pacemaker precautions PT Patient demonstrates impairments in the following area(s): Balance;Safety;Endurance;Pain PT Transfers Functional Problem(s): Bed Mobility;Bed to Chair;Car;Furniture PT Locomotion Functional Problem(s): Ambulation;Wheelchair Mobility;Stairs PT Plan PT Intensity: Minimum of 1-2 x/day ,45 to 90 minutes PT Frequency: 5 out of 7 days PT Duration Estimated Length of Stay: 7 days PT Treatment/Interventions: Ambulation/gait training;Community reintegration;DME/adaptive equipment instruction;Psychosocial support;Stair training;UE/LE Strength taining/ROM;Balance/vestibular training;Discharge planning;Pain management;Therapeutic Activities;Disease management/prevention;Functional mobility training;Patient/family education;Therapeutic Exercise PT Transfers Anticipated Outcome(s): mod I PT Locomotion Anticipated Outcome(s): mod I PT Recommendation Recommendations for Other Services: Neuropsych consult Follow Up Recommendations: Other (comment) (TBD) Patient destination: Home Equipment Recommended: To be determined   PT  Evaluation Precautions/Restrictions Precautions Precautions: Fall;ICD/Pacemaker Restrictions Weight Bearing Restrictions: No Other Position/Activity Restrictions: pacemaker precautions General   Vital Signs Pain Pain Assessment Pain Scale: 0-10 Pain Score: 4  (ribs R side>L) Pain Type: Acute pain Pain Location: Chest Pain Orientation: Right Pain Descriptors / Indicators: Aching Pain Frequency: Intermittent Pain Onset: Gradual Patients Stated Pain Goal: 3 Pain Intervention(s): Medication (See eMAR) Home Living/Prior Functioning Home Living Living Arrangements: Spouse/significant other Available Help at Discharge: Family;Available 24 hours/day Type of Home: House Home Access: Stairs to enter [employer] of Steps: 1 Entrance Stairs-Rails: None Home Layout: One level Bathroom Shower/Tub: Chiropodist: Handicapped height (and bidet) Bathroom Accessibility: Yes  Lives With: Spouse Prior Function Level of Independence: Independent with basic ADLs;Independent with gait;Independent with homemaking with ambulation  Able to Take Stairs?: Yes Driving: Yes 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 Vision/Perception  Geologist, engineering: Within Advertising copywriter Praxis Praxis: Intact  Cognition Overall Cognitive Status: Within Functional Limits for tasks assessed Arousal/Alertness: Awake/alert Orientation Level: Oriented X4 Attention: Focused;Sustained Focused Attention: Appears intact Sustained Attention: Appears intact Memory: Appears intact Immediate Memory Recall: Sock;Blue;Bed Memory Recall Sock: Without Cue Memory Recall Blue: Without Cue Memory Recall Bed: Without Cue Awareness: Appears intact Problem Solving: Appears intact Executive Function: Self Monitoring Self Monitoring: Impaired Self Monitoring Impairment: Functional basic Behaviors: Verbal agitation Safety/Judgment:  Impaired Sensation Sensation Light Touch: Impaired Detail Peripheral sensation comments: chronic decreased sensation fingers L Hot/Cold: Not tested Proprioception: Appears Intact Stereognosis: Appears Intact Coordination Gross Motor Movements are Fluid and Coordinated: Yes Fine Motor Movements are  Fluid and Coordinated: Yes Motor  Motor Motor: Within Functional Limits   Trunk/Postural Assessment  Cervical Assessment Cervical Assessment: Exceptions to [facility] (forward head) Thoracic Assessment Thoracic Assessment: Exceptions to [facility] (rounded shoulders) Lumbar Assessment Lumbar Assessment: Exceptions to [facility] (post tilt, somewhat due to enlarged scrotum) Postural Control Postural Control: Deficits on evaluation Righting Reactions: delayed bilat LEs  Balance Balance Balance Assessed: Yes Static Sitting Balance Static Sitting - Balance Support: Feet supported Static Sitting - Level of Assistance: 7: Independent (pt supervised only due to initial c/o dizzyness, not balance deficit) Dynamic Sitting Balance Dynamic Sitting - Balance Support: Feet supported Dynamic Sitting - Level of Assistance: 5: Stand by assistance Dynamic Sitting - Balance Activities: Lateral lean/weight shifting;Forward lean/weight shifting Sitting balance - Comments: supervision for safety. Static Standing Balance Static Standing - Balance Support: Right upper extremity supported;Left upper extremity supported Static Standing - Level of Assistance: 4: Min assist Extremity Assessment  RUE Assessment RUE Assessment: Within Functional Limits LUE Assessment LUE Assessment: Not tested General Strength Comments: NT 2/2 new ICD/pacemaker precautions RLE Assessment RLE Assessment: Within Functional Limits General Strength Comments: generalized weakness w/functional activities LLE Assessment LLE Assessment: Within Functional Limits General Strength Comments: generalized weakness w/functional activities  Care  Tool Care Tool Bed Mobility Roll left and right activity        Sit to lying activity        Lying to sitting edge of bed activity         Care Tool Transfers Sit to stand transfer   Sit to stand assist level: Contact Guard/Touching assist    Chair/bed transfer   Chair/bed transfer assist level: Contact Guard/Touching assist     Toilet transfer   Assist Level: Contact Guard/Touching assist    Car transfer   Car transfer assist level: Minimal Assistance - Patient > 75%      Care Tool Locomotion Ambulation   Assist level: Contact Guard/Touching assist Assistive device: Walker-rolling Max distance: 75  Walk 10 feet activity   Assist level: Contact Guard/Touching assist Assistive device: Walker-rolling   Walk 50 feet with 2 turns activity   Assist level: Contact Guard/Touching assist Assistive device: Walker-rolling  Walk 150 feet activity Walk 150 feet activity did not occur: Safety/medical concerns      Walk 10 feet on uneven surfaces activity   Assist level: Contact Guard/Touching assist Assistive device: Walker-rolling  Stairs   Assist level: Minimal Assistance - Patient > 75% Stairs assistive device: 2 hand rails Max number of stairs: 8  Walk up/down 1 step activity   Walk up/down 1 step (curb) assist level: Contact Guard/Touching assist Walk up/down 1 step or curb assistive device: 2 hand rails    Walk up/down 4 steps activity Walk up/down 4 steps assist level: Contact Guard/Touching assist Walk up/down 4 steps assistive device: 2 hand rails  Walk up/down 12 steps activity Walk up/down 12 steps activity did not occur: Safety/medical concerns      Pick up small objects from floor Pick up small object from the floor (from standing position) activity did not occur: Safety/medical concerns      Wheelchair Will patient use wheelchair at discharge?: No          Wheel 50 feet with 2 turns activity      Wheel 150 feet activity       SKILLED THERAPEUTIC  INTERVENTION; Evaluation completed (see details above and below) with education on PT POC and goals and individual treatment initiated with focus on functional mobility/transfers, LE  strength, dynamic standing balance/coordination, ambulation, stair navigation, simulated car transfers, and improved endurance with activity.   Evaluation completed (see details above and below) with education on PT POC and goals and individual treatment initiated with focus on functional mobility/transfers, LE strength, dynamic standing balance/coordination, ambulation, stair navigation, simulated car transfers, and improved endurance with activity Pt  oriented to rehab unit.  Discussed process of daily scheduling and receiving schedule, purpose  And team conference schedule and D/c planning.  Pt provided   Pt also provided w/ rolling walker and therapist adjusted to proper height for patient. Pt performed the following: Sit to stand and stand pivot transfer to RW w/CGA BP 140/71, 02 sats 96% on  2L 02 Gait x 28f w/RW, cues for upright posture, wide based gait, decreased cadence. No 02 utilized.  02 sats 97%, HR 80.  BP 131/58.  Pt wearing Ted hose. Car transfer w/RW w/cga, some difficulty w/raising LLE into car due to L knee flexion ROM restrictions/longstanding due to TKA. 02 sats 92%, rested in sitting several min between entering and exiting car for recovery. Ramp - ascended/descended 163framp w/RW w/cga, cues for safe pacing w/descending, some wobbles noted at bilat knees while descending. 02 sats 92% Stairs:  Ascends/descends 8 stairs w/2 rails w/cga to min assist, step to gait, some wobbles L knee w/descending, pt very fatigued w/this activity. 02 sats 92%, significantly increased RR, instructed w/deep breathing technique. stand pivot transfer to recliner w/min assist no AD.  Pt provided w/towels/urinal/handed off to NT at end of session to assist pt further. Stairs:  Ascends/descends    Refer to Care Plan  for Long Term Goals  SHORT TERM GOAL WEEK 1    Recommendations for other services: Neuropsych  Skilled Therapeutic Intervention Mobility Bed Mobility Bed Mobility: Not assessed Transfers Transfers: Sit to Stand;Stand to Sit;Stand Pivot Transfers Sit to Stand: Contact Guard/Touching assist Stand to Sit: Contact Guard/Touching assist Stand Pivot Transfers: Contact Guard/Touching assist Transfer (Assistive device): Rolling walker Locomotion  Gait Ambulation: Yes Gait Assistance: Contact Guard/Touching assist Gait Distance (Feet): 75 Feet Assistive device: Rolling walker Gait Gait: Yes Gait Pattern: Impaired (forward lean, wide base) Stairs / Additional Locomotion Stairs: Yes Stairs Assistance: Contact Guard/Touching assist Stair Management Technique: Two rails Number of Stairs: 8 Height of Stairs: 6 Ramp: Contact Guard/touching assist Curb: Contact Guard/Touching assist Wheelchair Mobility Wheelchair Mobility: No   Discharge Criteria: Patient will be discharged from PT if patient refuses treatment 3 consecutive times without medical reason, if treatment goals not met, if there is a change in medical status, if patient makes no progress towards goals or if patient is discharged from hospital.  The above assessment, treatment plan, treatment alternatives and goals were discussed and mutually agreed upon: by patient  BaJerrilyn Cairo/01/2020, 12:28 PM

## 2020-06-03 NOTE — Progress Notes (Signed)
Occupational Therapy Session Note  Patient Details  Name: [name] MRN: [id number] Date of Birth: 1955-10-03  Today's Date: 06/03/2020 OT Individual Time: 5198-2429 OT Individual Time Calculation (min): 44 min    Short Term Goals: Week 1:  OT Short Term Goal 1 (Week 1): STGs=LTGs  Skilled Therapeutic Interventions/Progress Updates:   Pt received in room in recliner and consented to OT tx. Pt seen for functional mobility with RW and RUE strengthening HEP to increase strength and activity tolerance with ADLs. Pt walked from room to nurses station with CGA with RW before requiring seated rest break. Pt then wheeled outside for time and instructed in 3#db exercises (RUE ONLY)  including shoulder press, shoulder flexion to 90*, shoulder abduction, elbow flexion, all for 3x15 with min cuing for proper technique with good carryover. Pt also completed elbow flexion exercises for 3x15 in LUE while maintaining pacemaker precautions. Given hand gripper to increase L hand and finger strength and to increase circulation. Pt required frequent rest breaks throughout tx, afterwards was helped back to recliner and left with alarm on and all needs met.   Therapy Documentation Precautions:  Precautions Precautions: Fall,ICD/Pacemaker Restrictions Weight Bearing Restrictions: No Other Position/Activity Restrictions: pacemaker precautions Pain: Pain Assessment Pain Scale: 0-10 Pain Score: 4  (ribs R side>L) Faces Pain Scale: Hurts a little bit Pain Type: Acute pain Pain Location: Chest Pain Orientation: Mid Pain Onset: With Activity Pain Intervention(s): Repositioned   Therapy/Group: Individual Therapy  Reighn Kaplan 06/03/2020, 1:51 PM

## 2020-06-03 NOTE — Progress Notes (Signed)
Inpatient Rehabilitation  Patient information reviewed and entered into eRehab system by Glorene Leitzke Jacarius Handel, OTR/L.   Information including medical coding, functional ability and quality indicators will be reviewed and updated through discharge.    

## 2020-06-03 NOTE — Progress Notes (Signed)
Occupational Therapy Assessment and Plan  Patient Details  Name: BEACHER EVERY MRN: [id number] Date of Birth: 07/22/1955  OT Diagnosis: acute pain and muscle weakness (generalized), endurance deficits Rehab Potential: Rehab Potential (ACUTE ONLY): Excellent ELOS: 5-7 days   Today's Date: 06/03/2020 OT Individual Time: 4098-1191 OT Individual Time Calculation (min): 76 min     Hospital Problem: Principal Problem:   Physical debility   Past Medical History:  Past Medical History:  Diagnosis Date  . Adenomatous polyp   . Cataract    left eye for sure   . Chronic pain   . Diabetes mellitus   . GERD (gastroesophageal reflux disease)   . History of colonic polyps   . History of hip replacement, total   . Hx of anaphylactic shock   . Hyperlipidemia   . Hypertension   . Left anterior hemiblock   . Obesity   . RBBB (right bundle branch block with left anterior fascicular block)   . 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 APPENDECTOMY    . CERVICAL FUSION    . CERVICAL LAMINECTOMY    . COLONOSCOPY    . CORONARY/GRAFT ACUTE MI REVASCULARIZATION N/A 05/19/2020   Procedure: Coronary/Graft Acute MI Revascularization;  Surgeon: Belva Crome, MD;  Location: [place] [facility];  Service: Cardiovascular;  Laterality: N/A;  . HAND SURGERY    . HARDWARE REMOVAL Left 09/10/2012   Procedure: HARDWARE REMOVAL;  Surgeon: Jessy Oto, MD;  Location: [place];  Service: Orthopedics;  Laterality: Left;  . HIP SURGERY  12/29/09   R THR  ([place])  . HIP SURGERY     bilateral  . ICD IMPLANT N/A 05/28/2020   Procedure: ICD IMPLANT;  Surgeon: Vickie Epley, MD;  Location: [place] [facility];  Service: Cardiovascular;  Laterality: N/A;  . LEFT HEART CATH AND CORONARY ANGIOGRAPHY N/A 05/19/2020   Procedure: LEFT HEART CATH AND CORONARY ANGIOGRAPHY;  Surgeon: Belva Crome, MD;  Location: [place] [facility];  Service: Cardiovascular;  Laterality: N/A;  . TOTAL HIP ARTHROPLASTY Left 09/10/2012   Procedure: REMOVAL OF HARDWARE LEFT PROXIMAL THIGH/FEMUR AND LEFT TOTAL HIP ARTHROPLASTY;  Surgeon: Jessy Oto, MD;  Location: [place];  Service: Orthopedics;  Laterality: Left;  . TOTAL KNEE ARTHROPLASTY  01/2008   left knee    Assessment & Plan Clinical Impression: Patient is a 65 y.o. year old male with recent admission to the hospital on 5/17 with OOH VF arrest with >45 min CPR. After [facility] at [facility] pt with hypotension evening of 5/17 and emesis with STEMI. Intubated 5/17-5/22. s/p ICD placement 05/28/20. PMhx: HTN, HLD, DM, GERD, chronic pain, RBBB, Left THA   Patient transferred to CIR on 06/02/2020 .    Patient currently requires min with basic self-care skills secondary to muscle weakness and decreased cardiorespiratoy endurance.  Prior to hospitalization, patient could complete ADLs and IADLs with independent .  Patient will benefit from skilled intervention to increase independence with basic self-care skills and increase level of independence with iADL prior to discharge home with care partner.  Anticipate patient will require intermittent supervision and follow up home health.  OT - End of Session Activity Tolerance: Tolerates 30+ min activity with multiple rests Endurance Deficit: Yes Endurance Deficit Description: pt required frequent rest breaks due to fatigue OT Assessment Rehab Potential (ACUTE ONLY): Excellent OT Barriers to Discharge: New oxygen;Weight  OT Barriers to Discharge Comments: new PPM/ICD precautions, strength and endurance deficits OT Patient demonstrates impairments in the following area(s): Balance;Endurance;Pain;Safety OT Basic ADL's Functional Problem(s): Bathing;Dressing;Toileting OT Advanced ADL's Functional Problem(s): Simple Meal Preparation OT Transfers Functional Problem(s): Toilet OT Plan OT Intensity: Minimum of 1-2 x/day, 45 to 90 minutes OT  Duration/Estimated Length of Stay: 5-7 OT Treatment/Interventions: Balance/vestibular training;Discharge planning;Functional mobility training;Therapeutic Activities;UE/LE Coordination activities;Patient/family education;DME/adaptive equipment instruction;Pain management;UE/LE Strength taining/ROM;Therapeutic Exercise;Self Care/advanced ADL retraining OT Self Feeding Anticipated Outcome(s): independent OT Basic Self-Care Anticipated Outcome(s): mod I OT Toileting Anticipated Outcome(s): mod I OT Bathroom Transfers Anticipated Outcome(s): supervision OT Recommendation Recommendations for Other Services: Neuropsych consult Patient destination: Home Follow Up Recommendations: Home health OT Equipment Recommended: Tub/shower seat   OT Evaluation Precautions/Restrictions  Precautions Precautions: Fall;ICD/Pacemaker Restrictions Weight Bearing Restrictions: No Other Position/Activity Restrictions: pacemaker precautions General Chart Reviewed: Yes Family/Caregiver Present: No Vital Signs Therapy Vitals Pulse Rate: 74 BP: (!) 158/65 Patient Position (if appropriate): Sitting Oxygen Therapy SpO2: 97 % O2 Device: Room Air Patient Activity (if Appropriate): In chair Pain Pain Assessment Pain Score: 0-No pain Home Living/Prior Functioning Home Living Family/patient expects to be discharged to:: Private residence Living Arrangements: Spouse/significant other Available Help at Discharge: Family,Available 24 hours/day Type of Home: House Home Access: Stairs to enter [employer] of Steps: 1 Home Layout: One level Bathroom Shower/Tub: Optometrist: Yes  Lives With: Spouse IADL History Homemaking Responsibilities: Yes Meal Prep Responsibility: Primary Laundry Responsibility: Primary Occupation: Retired Type of Occupation: retired Designer, television/film set, Airline pilot Prior Function Level of Independence: Independent with  basic [place] with [place] with homemaking with ambulation Driving: Yes Vocation: Retired Comments: Ind with ADLs, mobility and was driving Vision Baseline Vision/History: Wears glasses Wears Glasses: Reading only Patient Visual Report: No change from baseline Perception  Perception: Within Functional Limits Praxis Praxis: Intact Cognition Overall Cognitive Status: Within Functional Limits for tasks assessed Arousal/Alertness: Awake/alert Orientation Level: Situation;Person;Place Person: Oriented Place: Oriented Situation: Oriented Year: 2022 Month: June Day of Week: Correct Memory: Appears intact Immediate Memory Recall: Sock;Blue;Bed Memory Recall Sock: Without Cue Memory Recall Blue: Without Cue Memory Recall Bed: Without Cue Attention: Focused;Sustained Focused Attention: Appears intact Sustained Attention: Appears intact Awareness: Appears intact Problem Solving: Appears intact Executive Function: Self Monitoring Self Monitoring: Impaired Self Monitoring Impairment: Functional basic Behaviors: Verbal agitation Safety/Judgment: Impaired Sensation Sensation Light Touch: Impaired Detail Peripheral sensation comments: impaired sensation in L index finger Hot/Cold: Appears Intact Proprioception: Appears Intact Stereognosis: Appears Intact Coordination Gross Motor Movements are Fluid and Coordinated: Yes Fine Motor Movements are Fluid and Coordinated: Yes Motor  Motor Motor: Within Functional Limits  Trunk/Postural Assessment  Cervical Assessment Cervical Assessment: Exceptions to [facility] (forward head) Thoracic Assessment Thoracic Assessment: Exceptions to [facility] (rounded shoulders) Lumbar Assessment Lumbar Assessment: Exceptions to [facility] (post tilt, somewhat due to enlarged scrotum) Postural Control Postural Control: Deficits on evaluation Righting Reactions: delayed bilat LEs  Balance Balance Balance Assessed: Yes Static Sitting  Balance Static Sitting - Balance Support: Feet supported Static Sitting - Level of Assistance: 7: Independent (pt supervised only due to initial c/o dizzyness, not balance deficit) Dynamic Sitting Balance Dynamic Sitting - Balance Support: Feet supported Dynamic Sitting - Level of Assistance: 5: Stand by assistance Dynamic Sitting - Balance Activities: Lateral lean/weight shifting;Forward lean/weight shifting Sitting balance - Comments: supervision for safety. Static Standing Balance Static Standing - Balance Support: Right upper extremity supported;Left upper extremity supported Static Standing - Level of Assistance: 4: Min assist Extremity/Trunk Assessment RUE Assessment  RUE Assessment: Within Functional Limits LUE Assessment LUE Assessment: Not tested General Strength Comments: NT 2/2 new ICD/pacemaker precautions  Care Tool Care Tool Self Care Eating   Eating Assist Level: Set up assist    Oral Care    Oral Care Assist Level: Set up assist    Bathing   Body parts bathed by patient: Right arm;Left arm;Chest;Front perineal area;Abdomen;Buttocks;Right upper leg;Left upper leg;Face     Assist Level: Minimal Assistance - Patient > 75%    Upper Body Dressing(including orthotics)   What is the patient wearing?: Pull over shirt   Assist Level: Supervision/Verbal cueing    Lower Body Dressing (excluding footwear)   What is the patient wearing?: Pants;Underwear/pull up Assist for lower body dressing: Minimal Assistance - Patient > 75%    Putting on/Taking off footwear   What is the patient wearing?: Ted hose;Shoes Assist for footwear: Moderate Assistance - Patient 50 - 74%       Care Tool Toileting Toileting activity   Assist for toileting: Minimal Assistance - Patient > 75% (posterior hygiene)     Care Tool Bed Mobility Roll left and right activity Roll left and right activity did not occur: Refused      Sit to lying activity Sit to lying activity did not occur:  Refused      Lying to sitting edge of bed activity Lying to sitting edge of bed activity did not occur: Refused       Care Tool Transfers Sit to stand transfer   Sit to stand assist level: Contact Guard/Touching assist    Chair/bed transfer   Chair/bed transfer assist level: Contact Guard/Touching assist     Toilet transfer   Assist Level: Contact Guard/Touching assist     Care Tool Cognition Expression of Ideas and Wants Expression of Ideas and Wants: Without difficulty (complex and basic) - expresses complex messages without difficulty and with speech that is clear and easy to understand   Understanding Verbal and Non-Verbal Content Understanding Verbal and Non-Verbal Content: Understands (complex and basic) - clear comprehension without cues or repetitions   Memory/Recall Ability *first 3 days only Memory/Recall Ability *first 3 days only: Current season;Location of own room;Staff names and faces;That he or she is in a hospital/hospital unit    Refer to Care Plan for [place] 1 OT Short Term Goal 1 (Week 1): STGs=LTGs  Recommendations for other services: Neuropsych   Skilled Therapeutic Intervention  Pt received in recliner and consented to OT eval and tx. Session initiated with Ot eval and followed with instruction and training in self care tasks. Pt required frequent rest breaks due to fatigue throughout session and cuing for safety during functional mobility and ADLs. Educated pt on PPM/ICD precautions during ADLs and POC/ELOS with pt verbalizing understanding. After session, pt left in recliner with all needs met.    ADL ADL Eating: Set up Where Assessed-Eating: Chair Grooming: Setup Where Assessed-Grooming: Sitting at sink Upper Body Bathing: Supervision/safety Where Assessed-Upper Body Bathing: Sitting at sink Lower Body Bathing: Minimal assistance Where Assessed-Lower Body Bathing: Standing at sink Upper Body Dressing:  Supervision/safety Where Assessed-Upper Body Dressing: Chair Lower Body Dressing: Minimal assistance Where Assessed-Lower Body Dressing: Chair Toileting: Contact guard Where Assessed-Toileting: Glass blower/designer: Therapist, music Method: Ambulating (with RW) Science writer: Bedside commode (BSC positioned over toilet) Mobility  Bed Mobility Bed Mobility: Not assessed Transfers Sit to Stand: Contact Guard/Touching assist Stand to Sit: Contact Guard/Touching assist  Discharge Criteria: Patient will be discharged from OT if patient refuses treatment 3 consecutive times without medical reason, if treatment goals not met, if there is a change in medical status, if patient makes no progress towards goals or if patient is discharged from hospital.  The above assessment, treatment plan, treatment alternatives and goals were discussed and mutually agreed upon: by patient  Paulette Blanch 06/03/2020, 9:16 AM

## 2020-06-03 NOTE — Progress Notes (Signed)
Occupational Therapy Session Note  Patient Details  Name: [name] MRN: [id number] Date of Birth: 1955-03-28  Today's Date: 06/03/2020 OT Individual Time: 1004-1031 OT Individual Time Calculation (min): 27 min    Short Term Goals: Week 1:  OT Short Term Goal 1 (Week 1): STGs=LTGs  Skilled Therapeutic Interventions/Progress Updates:  Pt in recliner to start session.  He reports having some slight chest pain secondary to CPR but otherwise feels good.  Had him start with sit to stand from the recliner at min guard assist without an assistive device.  He was able then complete functional mobility to the door and back with min assist.  Dyspnea 3/4 after this short distance with O2 sats decreasing to 89% on room air.  After a brief rest of approximately 2 mins, he was ble to complete mobility down the hallway about 20' from his room with min assist and no device.  He then turned and ambulated back with dyspnea 3/4 again and O2 sats decreasing to 87% when checked.  Two liters O2 was applied to bring sats back up to 90% or greater.  Finished session with pt in the recliner with safety alarm pad in place and call button and phone in reach.    Therapy Documentation Precautions:  Precautions Precautions: Fall,ICD/Pacemaker Restrictions Weight Bearing Restrictions: No Other Position/Activity Restrictions: pacemaker precautions  Pain: Pain Assessment Pain Scale: 0-10 Pain Score: 4  (ribs R side>L) Faces Pain Scale: Hurts a little bit Pain Type: Acute pain Pain Location: Chest Pain Orientation: Mid Pain Descriptors / Indicators: Aching Pain Frequency: Intermittent Pain Onset: With Activity Patients Stated Pain Goal: 3 Pain Intervention(s): Repositioned ADL: See Care Tool Section for some details of mobility and selfcare  Therapy/Group: Individual Therapy  Alphonza Tramell OTR/L 06/03/2020, 12:40 PM

## 2020-06-03 NOTE — Plan of Care (Signed)
Nutrition Education Note  RD consulted for nutrition diet education.  Lipid Panel     Component Value Date/Time   CHOL 110 05/20/2020 0527   TRIG 160 (H) 05/20/2020 0527   HDL 18 (L) 05/20/2020 0527   CHOLHDL 6.1 05/20/2020 0527   VLDL 32 05/20/2020 0527   LDLCALC 60 05/20/2020 0527    RD provided "Heart Healthy Consistent Carbohydrate Nutrition Therapy" handout from the Academy of Nutrition and Dietetics. Reviewed patient's dietary recall. Provided examples on ways to decrease sodium and fat intake in diet. Discouraged intake of processed foods and use of salt shaker. Encouraged fresh fruits and vegetables as well as whole grain sources of carbohydrates to maximize fiber intake. Teach back method used.  Expect good compliance.  Body mass index is 38.87 kg/m. Pt meets criteria for class obesity based on current BMI.  Current diet order is heart healthy, patient is consuming approximately 100% of meals at this time. Labs and medications reviewed. No further nutrition interventions warranted at this time. RD contact information provided. If additional nutrition issues arise, please re-consult RD.  Corrin Parker, MS, RD, LDN RD pager number/after hours weekend pager number on Amion.

## 2020-06-04 DIAGNOSIS — N179 Acute kidney failure, unspecified: Secondary | ICD-10-CM

## 2020-06-04 LAB — GLUCOSE, CAPILLARY
Glucose-Capillary: 167 mg/dL — ABNORMAL HIGH (ref 70–99)
Glucose-Capillary: 195 mg/dL — ABNORMAL HIGH (ref 70–99)
Glucose-Capillary: 218 mg/dL — ABNORMAL HIGH (ref 70–99)
Glucose-Capillary: 257 mg/dL — ABNORMAL HIGH (ref 70–99)

## 2020-06-04 MED ORDER — ENOXAPARIN SODIUM 60 MG/0.6ML IJ SOSY
60.0000 mg | PREFILLED_SYRINGE | INTRAMUSCULAR | Status: DC
  Administered 2020-06-04 – 2020-06-09 (×6): 60 mg via SUBCUTANEOUS
  Filled 2020-06-04 (×7): qty 0.6

## 2020-06-04 NOTE — Progress Notes (Signed)
Physical Therapy Session Note  Patient Details  Name: [name] MRN: [id number] Date of Birth: May 01, 1955  Today's Date: 06/04/2020 PT Individual Time: 4081-4481 PT Individual Time Calculation (min): 70 min   Short Term Goals: Week 1:  PT Short Term Goal 1 (Week 1): STG = LTG due to ELOS   Skilled Therapeutic Interventions/Progress Updates:    Pt greeted sitting in w/c, agreeable to PT tx. No reports of pain but does endorse fatigue from busy day of therapies. Sit<>stand with CGA and no AD and transferred via stand<>pivot to w/c with CGA - needs cues for safety awareness during approach. W/c transport for energy conservation to day room gym. Completed stand<>pivot with CGA to mat table. Gait training 17ft with CGA and RW - fatigue onset after ~187ft with cues needed for keeping body within walker frame and guided breathing. SpO2 reading 91% on RA, raises to 95% with PLB during seated break. Ambulatory transfer with RW to [facility] where he completed 10 minutes at ~35cm/sec resistance while seated on Kinetron machine - working on global endurance and BLE strengthening. Ambulatory transfer with RW to mat table where he completed repeated sit<>stands as well as standing bicep curls and chest press with 4# dowel rod. Pt then instructed on standing alternating toe taps to cones with RW support, working on BLE coordination, standing tolerance, and global endurance. Pt then completed stair training where he navigated up/down 8 steps with CGA and bilateral hand rails, reciprocal stepping for ascent and step-to pattern for descent. Returned to room in w/c and completed stand<>pivot transfer with CGA and no AD to recliner. Chair alarm set and all needs within reach at end of session.   Therapy Documentation Precautions:  Precautions Precautions: Fall,ICD/Pacemaker Restrictions Weight Bearing Restrictions: No Other Position/Activity Restrictions: pacemaker precautions General:    Therapy/Group:  Individual Therapy  Alger Simons 06/04/2020, 7:41 AM

## 2020-06-04 NOTE — Progress Notes (Signed)
Occupational Therapy Session Note  Patient Details  Name: [name] MRN: [id number] Date of Birth: 1955-09-15  Today's Date: 06/04/2020 OT Individual Time: 0830-0930 OT Individual Time Calculation (min): 60 min    Short Term Goals: Week 1:  OT Short Term Goal 1 (Week 1): STGs=LTGs  Skilled Therapeutic Interventions/Progress Updates:    Pt received in recliner ready for therapy. He is aware that he can not lift LUE above shoulder height and did not want to shower even with ICD site covered.  He wants to wait to hear from his surgeon. Pt was able to move about the room with supervision, undressed with S and sat at sink to bathe with set up and complete oral care. He needed to toilet and requested to sit on regular seat without BSC. Pt did well with this. But then he was partially constipated and needed to sit on toilet for quite some time.  At end of session, pt still wanted to sit. Notified RN and NT pt would need A with cleansing.   Due to PLOF - LTG for toileting changed to min A as pt does not have a bidet here which he uses at home.    Therapy Documentation Precautions:  Precautions Precautions: Fall,ICD/Pacemaker Restrictions Weight Bearing Restrictions: No Other Position/Activity Restrictions: pacemaker precautions Vital Signs: Therapy Vitals Temp: 97.7 F (36.5 C) Pulse Rate: 83 Resp: 16 BP: 130/72 Patient Position (if appropriate): Sitting Oxygen Therapy SpO2: 99 % O2 Device: Room Air Pain: Pain Assessment Pain Scale: 0-10 Pain Score: 0-No pain ADL: ADL Eating: Set up Where Assessed-Eating: Chair Grooming: Setup Where Assessed-Grooming: Sitting at sink Upper Body Bathing: Supervision/safety Where Assessed-Upper Body Bathing: Sitting at sink Lower Body Bathing: Minimal assistance Where Assessed-Lower Body Bathing: Standing at sink Upper Body Dressing: Supervision/safety Where Assessed-Upper Body Dressing: Chair Lower Body Dressing: Minimal  assistance Where Assessed-Lower Body Dressing: Chair Toileting: Contact guard Where Assessed-Toileting: Glass blower/designer: Therapist, music Method: Ambulating (with RW) Toilet Transfer Equipment: Bedside commode (BSC positioned over toilet)   Therapy/Group: Individual Therapy  [name] 06/04/2020, 12:57 PM

## 2020-06-04 NOTE — Progress Notes (Addendum)
Occupational Therapy Session Note  Patient Details  Name: [name] MRN: [id number] Date of Birth: 14-Jul-1955  Today's Date: 06/04/2020 OT Individual Time: 1310-1400 OT Individual Time Calculation (min): 50 min    Short Term Goals: Week 1:  OT Short Term Goal 1 (Week 1): STGs=LTGs  Skilled Therapeutic Interventions/Progress Updates:    Pt sitting up in recliner, no c/o pain, agreeable to OT session.  Motivated to make progress in order to get home "next week". Pt ambulated using RW approximately 50 feet needing CGA to ADL bathroom with one standing rest break self initiated by pt due to reported left sided low back pain which he reports is a baseline issue.  Educated pt on safe tub step over transfer using shower chair.  Pt required CGA initially due to attempting to sit on chair prior to stepping in tub.  After re-education on safe technique pt able to return demonstrate with close supervision to step out.  SPO2 on RA dropped to 86% and pulse up to 124 bpm after shower transfer requiring seated rest break with education and VCs to implement pursed lip breathing.  After a few minutes of rest, SPO2 recovered to 96% and pulse to 92 bpm. Pt ambulated back to room without needing RB however noting some shortness of breath.  SPO2 dropped to 91% after ambulation, and recovered to 98% after seated RB and pursed lip breathing.  Pt sitting up in recliner, call bell in reach, seat alarm on at end of session.  Therapy Documentation Precautions:  Precautions Precautions: Fall,ICD/Pacemaker Restrictions Weight Bearing Restrictions: No Other Position/Activity Restrictions: pacemaker precautions   Therapy/Group: Individual Therapy  [name] 06/04/2020, 3:24 PM

## 2020-06-04 NOTE — Progress Notes (Signed)
[place] PHYSICAL MEDICINE & REHABILITATION PROGRESS NOTE  Subjective/Complaints: Patient seen sitting up in his chair this morning.  He states he slept fairly overnight.  He states he had a good bedtime first day of therapies yesterday.  He requests his IV be DC'd.  ROS: Denies CP, SOB, N/V/D  Objective: Vital Signs: Blood pressure 131/63, pulse 81, temperature 98.1 F (36.7 C), resp. rate 17, height 6' (1.829 m), weight 126.6 kg, SpO2 96 %. No results found. Recent Labs    06/03/20 0525  WBC 6.6  HGB 11.9*  HCT 37.2*  PLT 283   Recent Labs    06/03/20 0525  NA 136  K 4.0  CL 101  CO2 26  GLUCOSE 192*  BUN 26*  CREATININE 1.17  CALCIUM 8.6*    Intake/Output Summary (Last 24 hours) at 06/04/2020 0902 Last data filed at 06/04/2020 0725 Gross per 24 hour  Intake 480 ml  Output 1970 ml  Net -1490 ml        Physical Exam: BP 131/63   Pulse 81   Temp 98.1 F (36.7 C)   Resp 17   Ht 6' (1.829 m)   Wt 126.6 kg   SpO2 96%   BMI 37.85 kg/m  Constitutional: No distress . Vital signs reviewed.  Morbidly obese. HENT: Normocephalic.  Atraumatic. Eyes: EOMI. No discharge. Cardiovascular: No JVD.  RRR. Respiratory: Normal effort.  No stridor.  Bilateral clear to auscultation. GI: Non-distended.  BS +. Skin: Warm and dry.  Intact. Psych: Slightly disengaged.  Normal behavior. Musc: No edema in extremities.  No tenderness in extremities. Neuro: Alert Sensation diminished to light touch distal to mid calf bilaterally Motor: Bilateral upper extremities: 5/5 proximal distal Bilateral lower extremity: Flexion, knee extension 4 +/5, ankle dorsiflexion 4+/5  Assessment/Plan: 1. Functional deficits which require 3+ hours per day of interdisciplinary therapy in a comprehensive inpatient rehab setting.  Physiatrist is providing close team supervision and 24 hour management of active medical problems listed below.  Physiatrist and rehab team continue to assess barriers to  discharge/monitor patient progress toward functional and medical goals   Care Tool:  Bathing    Body parts bathed by patient: Right arm,Left arm,Chest,Front perineal area,Abdomen,Buttocks,Right upper leg,Left upper leg,Face         Bathing assist Assist Level: Minimal Assistance - Patient > 75%     Upper Body Dressing/Undressing Upper body dressing   What is the patient wearing?: Pull over shirt    Upper body assist Assist Level: Supervision/Verbal cueing    Lower Body Dressing/Undressing Lower body dressing      What is the patient wearing?: Pants,Underwear/pull up     Lower body assist Assist for lower body dressing: Minimal Assistance - Patient > 75%     Toileting Toileting    Toileting assist Assist for toileting: Minimal Assistance - Patient > 75%     Transfers Chair/bed transfer  Transfers assist     Chair/bed transfer assist level: Supervision/Verbal cueing     Locomotion Ambulation   Ambulation assist      Assist level: Minimal Assistance - Patient > 75% Assistive device: No Device Max distance: 50'   Walk 10 feet activity   Assist     Assist level: Contact Guard/Touching assist Assistive device: Walker-rolling   Walk 50 feet activity   Assist    Assist level: Contact Guard/Touching assist Assistive device: Walker-rolling    Walk 150 feet activity   Assist Walk 150 feet activity did not occur: Safety/medical  concerns         Walk 10 feet on uneven surface  activity   Assist     Assist level: Contact Guard/Touching assist Assistive device: Walker-rolling   Wheelchair     Assist Will patient use wheelchair at discharge?: No             Wheelchair 50 feet with 2 turns activity    Assist            Wheelchair 150 feet activity     Assist           Medical Problem List and Plan: 1.Functional and mobility deficitssecondary to VF cardiac arrest and subsequent debility  Continue  CIR 2. Antithrombotics: -DVT/anticoagulation:Pharmaceutical:Lovenox -antiplatelet therapy: DAPT 3.Chronic pain/Pain Management:Lidocaine/volatren gel to LBP --continue Neurontin bid and flexeril tid.  -voltaren gel to back and neck  Controlled with meds on 6/2  Monitor with increased exertion 4. Mood:LCSW to follow for evaluation and support. -antipsychotic agents: N/A 5. Neuropsych: This patientiscapable of making decisions on hisown behalf. 6. Skin/Wound Care:Routine pressure relief measures. -monitor incision sites, wounds 7. Fluids/Electrolytes/Nutrition:Monitor I/Os 8. 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 NO driving for 6 months.  --continue Coreg, ASA, Plavix and Lipitor.  --No heavy lifting or vigorous activity withtheleft arm for 6 to 8 weeks. Do not raise your left arm above your head for one week.  9. T2DM with hyperglycemia: Monitor BS ac/hs. Continue Levemir 38 units bid w/ novolog 6 units tid and slowly titrate up to home dose as required. --PTA was on Levemir 80 units HS with humalog 40/40/50 for meal coverage.  Discussed with pharmacy, home medication restarted  Elevated, but?  Improving on 6/2  Monitor with increased activity 10. Abnormal LFTs: Resolved 11. Leucocytosis: Resolved  12. Acute kidney injury  Creatinine 1.17 on 6/1, labs ordered for tomorrow  Continue to monitor 13.  Acute blood loss anemia  Hemoglobin 11.9 on 6/1, labs ordered for tomorrow  Continue to monitor 14.  Hypoalbuminemia  Supplement initiated on 6/1  LOS: 2 days A FACE TO FACE EVALUATION WAS PERFORMED  [name] [name] 06/04/2020, 9:02 AM

## 2020-06-05 DIAGNOSIS — N179 Acute kidney failure, unspecified: Secondary | ICD-10-CM

## 2020-06-05 LAB — CBC WITH DIFFERENTIAL/PLATELET
Abs Immature Granulocytes: 0.08 10*3/uL — ABNORMAL HIGH (ref 0.00–0.07)
Basophils Absolute: 0.1 10*3/uL (ref 0.0–0.1)
Basophils Relative: 1 %
Eosinophils Absolute: 0.2 10*3/uL (ref 0.0–0.5)
Eosinophils Relative: 3 %
HCT: 38.9 % — ABNORMAL LOW (ref 39.0–52.0)
Hemoglobin: 12.4 g/dL — ABNORMAL LOW (ref 13.0–17.0)
Immature Granulocytes: 1 %
Lymphocytes Relative: 16 %
Lymphs Abs: 1.2 10*3/uL (ref 0.7–4.0)
MCH: 28.9 pg (ref 26.0–34.0)
MCHC: 31.9 g/dL (ref 30.0–36.0)
MCV: 90.7 fL (ref 80.0–100.0)
Monocytes Absolute: 0.8 10*3/uL (ref 0.1–1.0)
Monocytes Relative: 11 %
Neutro Abs: 5 10*3/uL (ref 1.7–7.7)
Neutrophils Relative %: 68 %
Platelets: 289 10*3/uL (ref 150–400)
RBC: 4.29 MIL/uL (ref 4.22–5.81)
RDW: 14.8 % (ref 11.5–15.5)
WBC: 7.3 10*3/uL (ref 4.0–10.5)
nRBC: 0 % (ref 0.0–0.2)

## 2020-06-05 LAB — BASIC METABOLIC PANEL
Anion gap: 11 (ref 5–15)
BUN: 24 mg/dL — ABNORMAL HIGH (ref 8–23)
CO2: 23 mmol/L (ref 22–32)
Calcium: 8.4 mg/dL — ABNORMAL LOW (ref 8.9–10.3)
Chloride: 101 mmol/L (ref 98–111)
Creatinine, Ser: 1.22 mg/dL (ref 0.61–1.24)
GFR, Estimated: >60 mL/min (ref >60)
Glucose, Bld: 325 mg/dL — ABNORMAL HIGH (ref 70–99)
Potassium: 4.2 mmol/L (ref 3.5–5.1)
Sodium: 135 mmol/L (ref 135–145)

## 2020-06-05 LAB — GLUCOSE, CAPILLARY
Glucose-Capillary: 146 mg/dL — ABNORMAL HIGH (ref 70–99)
Glucose-Capillary: 213 mg/dL — ABNORMAL HIGH (ref 70–99)
Glucose-Capillary: 184 mg/dL — ABNORMAL HIGH (ref 70–99)
Glucose-Capillary: 198 mg/dL — ABNORMAL HIGH (ref 70–99)

## 2020-06-05 MED ORDER — METHOCARBAMOL 500 MG PO TABS: 500.0000 mg | ORAL_TABLET | Freq: Three times a day (TID) | ORAL | Status: DC | PRN

## 2020-06-05 NOTE — Progress Notes (Signed)
Physical Therapy Session Note  Patient Details  Name: [name] MRN: [id number] Date of Birth: 03-Feb-1955  Today's Date: 06/05/2020 PT Individual Time: 1100-1156 + 2774-1287 PT Individual Time Calculation (min): 56 min  + 72 min  Short Term Goals: Week 1:  PT Short Term Goal 1 (Week 1): STG = LTG due to ELOS  Skilled Therapeutic Interventions/Progress Updates:     1st session: Pt received sitting in recliner, agreeable to PT tx. No reports of resting pain but did demonstrate intermittent low back pain during ambulation trials - rest breaks provided as needed. SpO2 97% HR 67 resting on arrival. Ambulatory transfer with RW and close supervision to w/c and wheeled to main rehab gym for time management and energy conservation. Completed gait training 294ft + 259ft + 270ft + 214ft with close supervision and RW (seated rest breaks b/w bouts). SpO2 >93% throughout gait trials and recovers quickly with seated rest breaks. Completed Nustep for a total of 10 minutes at workload 5, using BLE's only, working on general strengthening and global endurance. Pt required rest breaks at minute 3, 5, 7:30. SpO2 assessed during each rest break, >93% for each interval. Wheeled back to his room for energy conservation in w/c and completed ambulatory transfer with supervision and RW to recliner. Chair alarm set and all needs in reach at end of session. NT present for handoff of care.   2nd session: Pt greeted sleeping in recliner, awakens easily to voice and is agreeable to PT tx. Reports no pain. Sit<>stand to RW with supervision and stand<>pivot with supervision and RW to w/c. Wheeled outdoors to practice gait outdoors for community reintegration as well as participate in seated there-ex outdoors. Ambulated 2x166ft with close supervision and RW on unlevel concrete surface - also practiced safety approach to varying surfaces such as park benches and chairs with no arm rests. Seated there-ex with 3# dumbbell 2x20  reps included: tricep press, bicep curl, arm punches, shldr press to RUE only (pt defers LUE due to chronic shldr pains). Also completed 3kg med ball chop/lift patterns. Returned upstairs to rehab gym via w/c. Completed stair training where he navigated up/down 12 steps with CGA and 2 hand rail support - spo2 reading 87% after this and needing seated rest break with PLB to quickly recover to 95%. Completed standing there-ex in // bars with 3lb ankle weight: -2x10 hip abduction, bilatreally -2x10 hamstring curls. bilatreally -2x10 hip marches, bilaterally *seated rest break b/w sets  Wheeled back to his room in w/c for energy conservation and completed stand<>pivot transfer with supervision and RW to his recliner. Chair alarm activated and all needs met at end of session.   Therapy Documentation Precautions:  Precautions Precautions: Fall,ICD/Pacemaker Restrictions Weight Bearing Restrictions: No Other Position/Activity Restrictions: pacemaker precautions General:    Therapy/Group: Individual Therapy  Chene Kasinger P Cedarius Kersh PT 06/05/2020, 7:27 AM

## 2020-06-05 NOTE — IPOC Note (Signed)
Individualized overall Plan of Care (IPOC) Patient Details Name: [name] MRN: [id number] DOB: [birth year]  Admitting Diagnosis: Physical debility  Hospital Problems: Principal Problem:   Physical debility Active Problems:   Hypoalbuminemia due to protein-calorie malnutrition ([place])   Acute blood loss anemia   Controlled type 2 diabetes mellitus with hyperglycemia, with long-term current use of insulin ([facility])   Chronic pain syndrome   AKI (acute kidney injury) ([place])     Functional Problem List: Nursing Edema,Safety,Medication Management,Endurance,Pain,Bowel  PT Balance,Safety,Endurance,Pain  OT Balance,Endurance,Pain,Safety  SLP    TR         Basic ADL's: OT Bathing,Dressing,Toileting     Advanced  ADL's: OT Simple Meal Preparation     Transfers: PT Bed Mobility,Bed to [employer]  OT Toilet     Locomotion: PT Ambulation,Wheelchair Mobility,Stairs     Additional Impairments: OT    SLP        TR      Anticipated Outcomes Item Anticipated Outcome  Self Feeding independent  Swallowing      Basic self-care  mod I  Toileting  mod I   Bathroom Transfers supervision  Bowel/Bladder  manage bowel with mod I assist  Transfers  mod I  Locomotion  mod I  Communication     Cognition     Pain  at or below level 4  Safety/Judgment  maintain safety with cues/reminders   Therapy Plan: PT Intensity: Minimum of 1-2 x/day ,45 to 90 minutes PT Frequency: 5 out of 7 days PT Duration Estimated Length of Stay: 7 days OT Intensity: Minimum of 1-2 x/day, 45 to 90 minutes OT Frequency: 5 out of 7 days OT Duration/Estimated Length of Stay: 5-7 days      Team Interventions: Nursing Interventions Bowel Management,Patient/Family Education,Pain Management,Discharge Planning,Medication Management,Disease Management/Prevention  PT interventions Ambulation/gait training,Community reintegration,DME/adaptive equipment instruction,Psychosocial support,Stair  training,UE/LE Strength taining/ROM,Balance/vestibular training,Discharge planning,Pain management,Therapeutic Activities,Disease management/prevention,Functional mobility training,Patient/family education,Therapeutic Exercise  OT Interventions Balance/vestibular training,Discharge planning,Functional mobility training,Therapeutic Activities,UE/LE Coordination activities,Patient/family education,DME/adaptive equipment instruction,Pain management,UE/LE Strength taining/ROM,Therapeutic Exercise,Self Care/advanced ADL retraining  SLP Interventions    TR Interventions    SW/CM Interventions Discharge Planning,Psychosocial Support,Patient/Family Education   Barriers to Discharge MD  Medical stability and Weight  Nursing Decreased caregiver support,Home environment access/layout 1 level 1 ste no rails with wife  PT Higher education careers adviser (comments) Pacemaker precautions  OT New oxygen,Weight new PPM/ICD precautions, strength and endurance deficits  SLP      SW       Team Discharge Planning: Destination: PT-Home ,OT- Home , SLP-  Projected Follow-up: PT-Other (comment) (TBD), OT-  Home health OT, SLP-  Projected Equipment Needs: PT-To be determined, OT- Tub/shower seat,To be determined, SLP-  Equipment Details: PT- , OT-  Patient/family involved in discharge planning: PT- Patient,  OT-Patient, SLP-   MD ELOS: 5-7 days. Medical Rehab Prognosis:  Good Assessment: 65 year old male with history of HTN, RBBB, T2DM, chronic LBP who was admitted on 05/19/20 via [facility] with reports of chest pain followed by VF cardiac arrest w/30 minutes CPR prior to ROSC. 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 He was started on amiodarone drip and cardiogenic shock felt to be due to septic shock from aspiration PNA. He was treated with IV antibiotics as well as multiple pressors and required precedex to manage  agitation. He tolerated extubation and AKI with shocked liver felt to be due to prolonged arrest  and fluid overload improved with lasix drip. He had recurrent 7 second CHB on 05/23 requiring dobutamine and amiodarone d/c. Repeat limited echo showed normal RV function and size with no wall abnormality. He underwent dual chamber ICD implantation by Dr. Quentin Ore on 05/26 with recommendations to hold Plavix for 3 days and NO driving X 6 months. Patient with resulting functional deficits with mobility, endurance, self-care.  We will set goals for mod I with PT/OT.  Due to the current state of emergency, patients may not be receiving their 3-hours of Medicare-mandated therapy.  See Team Conference Notes for weekly updates to the plan of care

## 2020-06-05 NOTE — Progress Notes (Signed)
Occupational Therapy Session Note  Patient Details  Name: [name] MRN: [id number] Date of Birth: 26-Feb-1955  Today's Date: 06/05/2020 OT Individual Time: 3254-9826 OT Individual Time Calculation (min): 26 min    Short Term Goals: Week 1:  OT Short Term Goal 1 (Week 1): STGs=LTGs  Skilled Therapeutic Interventions/Progress Updates:    Pt greeted seated in recliner and agreeable to OT treatment session. OT educated on friction reducing bag to help don TED hose, but pt unable to assist> Pt stated he uses sock-aid at baseline and spouse will assist with TED hose. Educated on teaching her technique as well. Pt ambulated in room with CGA and no AD to get a clean shirt. Pt sat EOB to don a clean shirt with set-up A. Pt then ambulated to the nurses station w/ RW and close supervision. Pt left seated in recliner with chair alarm on, call bell in reach, and needs met.   Therapy Documentation Precautions:  Precautions Precautions: Fall,ICD/Pacemaker Restrictions Weight Bearing Restrictions: No Other Position/Activity Restrictions: pacemaker precautions Pain:   Pt reports abdominal pain. No number given, rest and repositioned for comfort  Therapy/Group: Individual Therapy  Valma Cava 06/05/2020, 9:08 AM

## 2020-06-05 NOTE — Progress Notes (Signed)
Occupational Therapy Session Note  Patient Details  Name: [name] MRN: [id number] Date of Birth: October 12, 1955  Today's Date: 06/05/2020 OT Individual Time: 7867-5449 OT Individual Time Calculation (min): 60 min    Short Term Goals: Week 1:  OT Short Term Goal 1 (Week 1): STGs=LTGs  Skilled Therapeutic Interventions/Progress Updates:    Pt sitting up in recliner, reports feeling tired from yesterday but no pain at rest.  Pt already washed up this AM, therefore focused session on dynamic standing balance and overall endurance as well as energy conservation education.  Pt ambulated 150 feet using RW with close supervision to the large gym, needing one seated rest break at the half point due to fatigue.  Pt completed dynamic standing task without AD needing close supervision using BUE for functional grasp and place to facilitate leaning outside BOS.  Pt tolerated standing x 5 minute intervals before needing seated rest break.  Completed 2 intervals total of task.  Pt transported back to room via w/c for time management. Monitored SPO2 and pulse intermittently throughout session.  Pt maintained SPO2 above 95% and pulse remained in high 80s throughout session. Educated pt on energy conservation principles and collaborated/discussed with pt on home application.  Provided handout on energy conservation for improved carryover.  Pt sitting up in recliner, call bell in reach at end of session.  Therapy Documentation Precautions:  Precautions Precautions: Fall,ICD/Pacemaker Restrictions Weight Bearing Restrictions: No Other Position/Activity Restrictions: pacemaker precautions   Therapy/Group: Individual Therapy  [name] 06/05/2020, 12:34 PM

## 2020-06-05 NOTE — Progress Notes (Signed)
[place] PHYSICAL MEDICINE & REHABILITATION PROGRESS NOTE  Subjective/Complaints: Sitting up in his chair this morning.  He states he slept well overnight.  He states he had a large bowel movement yesterday.  He notes he had some transient back pain when attempting to ambulate this morning.  ROS: Denies CP, SOB, N/V/D  Objective: Vital Signs: Blood pressure 136/69, pulse 83, temperature 98.2 F (36.8 C), resp. rate 17, height 6' (1.829 m), weight 126.6 kg, SpO2 92 %. No results found. Recent Labs    06/03/20 0525 06/05/20 0753  WBC 6.6 7.3  HGB 11.9* 12.4*  HCT 37.2* 38.9*  PLT 283 289   Recent Labs    06/03/20 0525 06/05/20 0753  NA 136 135  K 4.0 4.2  CL 101 101  CO2 26 23  GLUCOSE 192* 325*  BUN 26* 24*  CREATININE 1.17 1.22  CALCIUM 8.6* 8.4*    Intake/Output Summary (Last 24 hours) at 06/05/2020 1000 Last data filed at 06/05/2020 0719 Gross per 24 hour  Intake 1080 ml  Output 1250 ml  Net -170 ml        Physical Exam: BP 136/69 (BP Location: Right Arm)   Pulse 83   Temp 98.2 F (36.8 C)   Resp 17   Ht 6' (1.829 m)   Wt 126.6 kg   SpO2 92%   BMI 37.85 kg/m  Constitutional: No distress . Vital signs reviewed.  Morbidly obese. HENT: Normocephalic.  Atraumatic. Eyes: EOMI. No discharge. Cardiovascular: No JVD.  RRR. Respiratory: Normal effort.  No stridor.  Bilateral clear to auscultation. GI: Non-distended.  BS +. Skin: Warm and dry.  Intact. Psych: Normal mood.  Normal behavior.  Improved affect. Musc: No edema in extremities.  No tenderness in extremities. Neuro: Alert Sensation diminished to light touch distal to mid calf bilaterally Motor: Bilateral upper extremities: 5/5 proximal distal Bilateral lower extremity: Flexion, knee extension 4 +/5, ankle dorsiflexion 4+/5, stable  Assessment/Plan: 1. Functional deficits which require 3+ hours per day of interdisciplinary therapy in a comprehensive inpatient rehab setting.  Physiatrist is  providing close team supervision and 24 hour management of active medical problems listed below.  Physiatrist and rehab team continue to assess barriers to discharge/monitor patient progress toward functional and medical goals   Care Tool:  Bathing    Body parts bathed by patient: Right arm,Left arm,Chest,Front perineal area,Abdomen,Buttocks,Right upper leg,Left upper leg,Face         Bathing assist Assist Level: Minimal Assistance - Patient > 75%     Upper Body Dressing/Undressing Upper body dressing   What is the patient wearing?: Pull over shirt    Upper body assist Assist Level: Supervision/Verbal cueing    Lower Body Dressing/Undressing Lower body dressing      What is the patient wearing?: Pants,Underwear/pull up     Lower body assist Assist for lower body dressing: Minimal Assistance - Patient > 75%     Toileting Toileting    Toileting assist Assist for toileting: Minimal Assistance - Patient > 75%     Transfers Chair/bed transfer  Transfers assist     Chair/bed transfer assist level: Supervision/Verbal cueing     Locomotion Ambulation   Ambulation assist      Assist level: Minimal Assistance - Patient > 75% Assistive device: No Device Max distance: 50'   Walk 10 feet activity   Assist     Assist level: Contact Guard/Touching assist Assistive device: Walker-rolling   Walk 50 feet activity   Assist  Assist level: Contact Guard/Touching assist Assistive device: Walker-rolling    Walk 150 feet activity   Assist Walk 150 feet activity did not occur: Safety/medical concerns         Walk 10 feet on uneven surface  activity   Assist     Assist level: Contact Guard/Touching assist Assistive device: Walker-rolling   Wheelchair     Assist Will patient use wheelchair at discharge?: No             Wheelchair 50 feet with 2 turns activity    Assist            Wheelchair 150 feet activity      Assist           Medical Problem List and Plan: 1.Functional and mobility deficitssecondary to VF cardiac arrest and subsequent debility  Continue CIR 2. Antithrombotics: -DVT/anticoagulation:Pharmaceutical:Lovenox -antiplatelet therapy: DAPT 3.Chronic pain/Pain Management:Lidocaine/volatren gel to LBP --continue Neurontin bid and flexeril tid.  -voltaren gel to back and neck  Robaxin as needed started on 6/3  Monitor with increased exertion 4. Mood:LCSW to follow for evaluation and support. -antipsychotic agents: N/A 5. Neuropsych: This patientiscapable of making decisions on hisown behalf. 6. Skin/Wound Care:Routine pressure relief measures. -monitor incision sites, wounds 7. Fluids/Electrolytes/Nutrition:Monitor I/Os 8. 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 NO driving for 6 months.  --continue Coreg, ASA, Plavix and Lipitor.  --No heavy lifting or vigorous activity withtheleft arm for 6 to 8 weeks. Do not raise your left arm above your head for one week.  9. T2DM with hyperglycemia: Monitor BS ac/hs. Continue Levemir 38 units bid w/ novolog 6 units tid and slowly titrate up to home dose as required. --PTA was on Levemir 80 units HS with humalog 40/40/50 for meal coverage.  Discussed with pharmacy, home medication restarted  Labile on 6/3, monitor for trend,?  Stabilizing  Monitor with increased activity 10. Abnormal LFTs: Resolved 11. Leucocytosis: Resolved  12. Acute kidney injury  Creatinine 1.22 on 6/3   Encourage fluids  Continue to monitor 13.  Acute blood loss anemia  Hemoglobin 12.4 on 6/3   Continue to monitor 14.  Hypoalbuminemia  Supplement initiated on 6/1  LOS: 3 days A FACE TO FACE EVALUATION WAS PERFORMED  [name] Lorie Phenix 06/05/2020, 10:00 AM

## 2020-06-06 LAB — GLUCOSE, CAPILLARY
Glucose-Capillary: 122 mg/dL — ABNORMAL HIGH (ref 70–99)
Glucose-Capillary: 186 mg/dL — ABNORMAL HIGH (ref 70–99)
Glucose-Capillary: 225 mg/dL — ABNORMAL HIGH (ref 70–99)
Glucose-Capillary: 233 mg/dL — ABNORMAL HIGH (ref 70–99)

## 2020-06-06 NOTE — Progress Notes (Signed)
Occupational Therapy Session Note  Patient Details  Name: [name] MRN: [id number] Date of Birth: 03-22-1955  Today's Date: 06/06/2020 OT Individual Time: 1445-1530 and 1445-1530 OT Individual Time Calculation (min): 45 min and 45 min    Short Term Goals: Week 1:  OT Short Term Goal 1 (Week 1): STGs=LTGs  Skilled Therapeutic Interventions/Progress Updates:    Visit 1: no c/o pain Pt would like to shower but requested to do that at the end of the day.  He used RW to ambulate to gym with S.  In gym, focused on LE strengthening to help his standing endurance for ADLs. Used 5 lb ankle wts for knee extensions with isometric holds and active extensions and then for ankle crosses for inner thighs.  Level 4 theraband for resisted knee flexion for hamstrings.  Did a few sit to stands with S but not high reps due to back and hip pain (premorbid from arthritis). Resistive band exercises for R shoulder with rows and ext rotation.  L shoulder AROM of ext rotation with arm in neutral.  Pt then ambulated back to room and rested in recliner with all needs met.    Visit 2: Pain: no complaints of pain Pt completed toileting, shower and dressing demonstrating excellent activity tolerance and balance. Discussed his home set up. Pt feels he would be ready to go home early to mid week next week.  ADL Eating: Independent Where Assessed-Eating: Chair Grooming: Independent Where Assessed-Grooming: Sitting at sink Upper Body Bathing: Setup Where Assessed-Upper Body Bathing: Shower Lower Body Bathing: Setup Where Assessed-Lower Body Bathing: Shower Upper Body Dressing: Independent Where Assessed-Upper Body Dressing: Chair Lower Body Dressing: Modified independent (except for help with TEDs and socks without sock aid) Where Assessed-Lower Body Dressing: Chair Toileting: Contact guard Where Assessed-Toileting: Glass blower/designer: Distant supervision Armed forces technical officer Method: Ambulating (with  RW) Science writer: Energy manager: Close supervision Social research officer, government Method: Heritage manager: Civil engineer, contracting with back,Grab bars  Pt resting in recliner with all needs met.   Therapy Documentation Precautions:  Precautions Precautions: Fall,ICD/Pacemaker Restrictions Weight Bearing Restrictions: No Other Position/Activity Restrictions: pacemaker precautions    Vital Signs: Therapy Vitals Temp: 97.7 F (36.5 C) Pulse Rate: 80 Resp: 19 BP: 117/68 Patient Position (if appropriate): Sitting Oxygen Therapy SpO2: 98 % O2 Device: Room Air       Therapy/Group: Individual Therapy  [place] 06/06/2020, 4:04 PM

## 2020-06-06 NOTE — Progress Notes (Signed)
Physical Therapy Session Note  Patient Details  Name: [name] MRN: [id number] Date of Birth: Feb 19, 1955  Today's Date: 06/06/2020 PT Individual Time: 0800-0900; 1300-1340 PT Individual Time Calculation (min): 60 min and 40 mins  Short Term Goals: Week 1:  PT Short Term Goal 1 (Week 1): STG = LTG due to ELOS  Skilled Therapeutic Interventions/Progress Updates:    Session 1: Patient received sitting up in wc, agreeable to PT. He denies pain, but does report onset of rib pain with deep breathing. Patient requesting to use bathroom. He was able to ambulate to bathroom with RW and supervision. Continent of bowel. TotalA for perihygiene in standing, however. Patient not on supplemental O2 and oxygen after toileting 95%. Patient ambulating to gym with RW and supervision. O2 95%. He completed 3x2.73mins on NuStep with LEs only for improved CV endurance. HR 80-89BPM and O2 93-95% on RA after each bout. He did require extended rest breaks between bouts due to reports of SOB. Patient ambulating back to his room with RW and supervision. Returning to recliner, chair alarm on, call light within reach.    Session 2: Patient received sitting up in recliner, agreeable to PT. He denies pain. He was able to ambulate to therapy gym with RW and supervision. He reports that his gait speed and mechanics are "roughly" premorbid due to multiple joint sxs. O2 upon sitting 97% on RA. Patient completing dynamic standing balance task on foam with bicep curls 3# dowel to provide small perturbations. PT providing close supervision/intermittent CGA to assist with balance. Appropriate ankle strategy maintained throughout. Patient requiring extended seated rest breaks between bouts due to fatigue, but O2 >97% after each bout. Patient reporting increased "tightness" to posterior B LE. PT guiding patient through static stretches seated to assist. Patient ambulating back to his room with RW and supervision. Up in recliner, chair  alarm on, call light within reach.   Therapy Documentation Precautions:  Precautions Precautions: Fall,ICD/Pacemaker Restrictions Weight Bearing Restrictions: No Other Position/Activity Restrictions: pacemaker precautions    Therapy/Group: Individual Therapy  Karoline Caldwell, PT, DPT, CBIS  06/06/2020, 7:38 AM

## 2020-06-06 NOTE — Plan of Care (Signed)
  Problem: Consults Goal: RH GENERAL PATIENT EDUCATION Description: See Patient Education module for education specifics. Outcome: Progressing   Problem: RH BOWEL ELIMINATION Goal: RH STG MANAGE BOWEL WITH ASSISTANCE Description: STG Manage Bowel with mod I Assistance. Outcome: Progressing Goal: RH STG MANAGE BOWEL W/MEDICATION W/ASSISTANCE Description: STG Manage Bowel with Medication with min Assistance. Outcome: Progressing   Problem: RH SAFETY Goal: RH STG ADHERE TO SAFETY PRECAUTIONS W/ASSISTANCE/DEVICE Description: STG Adhere to Safety Precautions With min Assistance/Device. Outcome: Progressing   Problem: RH PAIN MANAGEMENT Goal: RH STG PAIN MANAGED AT OR BELOW PT'S PAIN GOAL Description: At or below level 4 Outcome: Progressing   Problem: RH KNOWLEDGE DEFICIT GENERAL Goal: RH STG INCREASE KNOWLEDGE OF SELF CARE AFTER HOSPITALIZATION Description: Patient and wife will be able to manage care using handouts and educational resources independently Outcome: Progressing   Problem: Education: Goal: Individualized Educational Video(s) Outcome: Progressing   Problem: Cardiac: Goal: Ability to achieve and maintain adequate cardiopulmonary perfusion will improve Description: Patient and wife will be able to manage care at discharge using handouts and educational resources independently Outcome: Progressing

## 2020-06-06 NOTE — Progress Notes (Signed)
[facility] PHYSICAL MEDICINE & REHABILITATION PROGRESS NOTE  Subjective/Complaints: Asks for CPAP at night- had at home but did not always use as it dried him out. He would like to try again with teeth in place tonight. Does feel fatigued today. Trying to nap in between sessions. Vitals stable  ROS: Denies CP, SOB, N/V/D, +fatigue  Objective: Vital Signs: Blood pressure 133/60, pulse 77, temperature 98.5 F (36.9 C), resp. rate 20, height 6' (1.829 m), weight 126.6 kg, SpO2 97 %. No results found. Recent Labs    06/05/20 0753  WBC 7.3  HGB 12.4*  HCT 38.9*  PLT 289   Recent Labs    06/05/20 0753  NA 135  K 4.2  CL 101  CO2 23  GLUCOSE 325*  BUN 24*  CREATININE 1.22  CALCIUM 8.4*    Intake/Output Summary (Last 24 hours) at 06/06/2020 1107 Last data filed at 06/06/2020 0807 Gross per 24 hour  Intake 840 ml  Output 2400 ml  Net -1560 ml        Physical Exam: BP 133/60 (BP Location: Right Arm)   Pulse 77   Temp 98.5 F (36.9 C)   Resp 20   Ht 6' (1.829 m)   Wt 126.6 kg   SpO2 97%   BMI 37.85 kg/m   Gen: no distress, normal appearing HEENT: oral mucosa pink and moist, NCAT Cardio: Reg rate Chest: normal effort, normal rate of breathing Abd: soft, non-distended Ext: no edema Psych: Normal mood.  Normal behavior.  Improved affect. Musc: No edema in extremities.  No tenderness in extremities. Neuro: Alert Sensation diminished to light touch distal to mid calf bilaterally Motor: Bilateral upper extremities: 5/5 proximal distal Bilateral lower extremity: Flexion, knee extension 4 +/5, ankle dorsiflexion 4+/5, stable   Assessment/Plan: 1. Functional deficits which require 3+ hours per day of interdisciplinary therapy in a comprehensive inpatient rehab setting.  Physiatrist is providing close team supervision and 24 hour management of active medical problems listed below.  Physiatrist and rehab team continue to assess barriers to discharge/monitor patient  progress toward functional and medical goals   Care Tool:  Bathing    Body parts bathed by patient: Right arm,Left arm,Chest,Front perineal area,Abdomen,Buttocks,Right upper leg,Left upper leg,Face         Bathing assist Assist Level: Minimal Assistance - Patient > 75%     Upper Body Dressing/Undressing Upper body dressing   What is the patient wearing?: Pull over shirt    Upper body assist Assist Level: Supervision/Verbal cueing    Lower Body Dressing/Undressing Lower body dressing      What is the patient wearing?: Pants,Underwear/pull up     Lower body assist Assist for lower body dressing: Minimal Assistance - Patient > 75%     Toileting Toileting    Toileting assist Assist for toileting: Minimal Assistance - Patient > 75%     Transfers Chair/bed transfer  Transfers assist     Chair/bed transfer assist level: Supervision/Verbal cueing     Locomotion Ambulation   Ambulation assist      Assist level: Supervision/Verbal cueing Assistive device: Walker-rolling Max distance: 75   Walk 10 feet activity   Assist     Assist level: Supervision/Verbal cueing Assistive device: Walker-rolling   Walk 50 feet activity   Assist    Assist level: Supervision/Verbal cueing Assistive device: Walker-rolling    Walk 150 feet activity   Assist Walk 150 feet activity did not occur: Safety/medical concerns  Walk 10 feet on uneven surface  activity   Assist     Assist level: Contact Guard/Touching assist Assistive device: Walker-rolling   Wheelchair     Assist Will patient use wheelchair at discharge?: No             Wheelchair 50 feet with 2 turns activity    Assist            Wheelchair 150 feet activity     Assist           Medical Problem List and Plan: 1.Functional and mobility deficitssecondary to VF cardiac arrest and subsequent debility  Continue CIR 2.  Antithrombotics: -DVT/anticoagulation:Pharmaceutical:Lovenox -antiplatelet therapy: DAPT 3.Chronic pain/Pain Management:Lidocaine/volatren gel to LBP --continue Neurontin bid and flexeril tid.  -voltaren gel to back and neck  Robaxin as needed started on 6/3  Monitor with increased exertion 4. Mood:LCSW to follow for evaluation and support. -antipsychotic agents: N/A 5. Neuropsych: This patientiscapable of making decisions on hisown behalf. 6. Skin/Wound Care:Routine pressure relief measures. -monitor incision sites, wounds 7. Fluids/Electrolytes/Nutrition:Monitor I/Os 8. 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 NO driving for 6 months.  --continue Coreg, ASA, Plavix and Lipitor.  --No heavy lifting or vigorous activity withtheleft arm for 6 to 8 weeks. Do not raise your left arm above your head for one week.  9. T2DM with hyperglycemia: Monitor BS ac/hs. Continue Levemir 38 units bid w/ novolog 6 units tid and slowly titrate up to home dose as required. --PTA was on Levemir 80 units HS with humalog 40/40/50 for meal coverage.  Discussed with pharmacy, home medication restarted  Labile on 6/3, monitor for trend,?  Stabilizing  Monitor with increased activity 10. Abnormal LFTs: Resolved 11. Leucocytosis: Resolved  12. Acute kidney injury  Creatinine 1.22 on 6/3   Encourage fluids  Continue to monitor 13.  Acute blood loss anemia  Hemoglobin 12.4 on 6/3   Continue to monitor 14.  Hypoalbuminemia  Supplement initiated on 6/1 15. OSA: CPAP ordered.  16. Obesity BMI 37.85: educated that use of CPAP at night can also help with weight loss 17. Fatigue: discussed option of modafinil for fatigue if does not improve with CPAP use.   LOS: 4 days A FACE TO FACE EVALUATION WAS PERFORMED  [name] 06/06/2020, 11:07 AM

## 2020-06-06 NOTE — Progress Notes (Signed)
RT note. Patient cpap all set up for the night, patient will place on  self when ready for bed. Set on auto 22/6

## 2020-06-07 LAB — GLUCOSE, CAPILLARY
Glucose-Capillary: 147 mg/dL — ABNORMAL HIGH (ref 70–99)
Glucose-Capillary: 297 mg/dL — ABNORMAL HIGH (ref 70–99)
Glucose-Capillary: 224 mg/dL — ABNORMAL HIGH (ref 70–99)
Glucose-Capillary: 163 mg/dL — ABNORMAL HIGH (ref 70–99)

## 2020-06-07 NOTE — Progress Notes (Signed)
Patient refused CPAP for the night. Patient wearing oxygen set at 2lpm with Sp02=97%

## 2020-06-07 NOTE — Progress Notes (Signed)
[facility] PHYSICAL MEDICINE & REHABILITATION PROGRESS NOTE  Subjective/Complaints: Was unable to tolerate CPAP last night since it did not have humidifier. Was told that there was a recall of humidifiers so he would be unable to get one here, plans to use [place] at night while here C/o right sided flank pain, intermittent at home as well.  ROS: Denies CP, SOB, N/V/D, +fatigue, +constipation  Objective: Vital Signs: Blood pressure 139/60, pulse 75, temperature 97.7 F (36.5 C), resp. rate 20, height 6' (1.829 m), weight 126.6 kg, SpO2 100 %. No results found. Recent Labs    06/05/20 0753  WBC 7.3  HGB 12.4*  HCT 38.9*  PLT 289   Recent Labs    06/05/20 0753  NA 135  K 4.2  CL 101  CO2 23  GLUCOSE 325*  BUN 24*  CREATININE 1.22  CALCIUM 8.4*    Intake/Output Summary (Last 24 hours) at 06/07/2020 1216 Last data filed at 06/07/2020 0817 Gross per 24 hour  Intake 960 ml  Output 2175 ml  Net -1215 ml        Physical Exam: BP 139/60 (BP Location: Right Arm)   Pulse 75   Temp 97.7 F (36.5 C)   Resp 20   Ht 6' (1.829 m)   Wt 126.6 kg   SpO2 100%   BMI 37.85 kg/m   Gen: no distress, normal appearing HEENT: oral mucosa pink and moist, NCAT Cardio: Reg rate Chest: normal effort, normal rate of breathing Abd: soft, non-distended Ext: no edema Psych: Normal mood.  Normal behavior.  Improved affect. Musc: No edema in extremities.  No tenderness in extremities. Neuro: Alert Sensation diminished to light touch distal to mid calf bilaterally Motor: Bilateral upper extremities: 5/5 proximal distal Bilateral lower extremity: Flexion, knee extension 4 +/5, ankle dorsiflexion 4+/5, stable   Assessment/Plan: 1. Functional deficits which require 3+ hours per day of interdisciplinary therapy in a comprehensive inpatient rehab setting.  Physiatrist is providing close team supervision and 24 hour management of active medical problems listed below.  Physiatrist and rehab team  continue to assess barriers to discharge/monitor patient progress toward functional and medical goals   Care Tool:  Bathing    Body parts bathed by patient: Right arm,Left arm,Chest,Front perineal area,Abdomen,Buttocks,Right upper leg,Left upper leg,Face,Right lower leg,Left lower leg         Bathing assist Assist Level: Set up assist     Upper Body Dressing/Undressing Upper body dressing   What is the patient wearing?: Pull over shirt    Upper body assist Assist Level: Independent    Lower Body Dressing/Undressing Lower body dressing      What is the patient wearing?: Pants,Underwear/pull up     Lower body assist Assist for lower body dressing: Independent with assitive device     Toileting Toileting    Toileting assist Assist for toileting: Minimal Assistance - Patient > 75%     Transfers Chair/bed transfer  Transfers assist     Chair/bed transfer assist level: Supervision/Verbal cueing     Locomotion Ambulation   Ambulation assist      Assist level: Supervision/Verbal cueing Assistive device: Walker-rolling Max distance: 150   Walk 10 feet activity   Assist     Assist level: Supervision/Verbal cueing Assistive device: Walker-rolling   Walk 50 feet activity   Assist    Assist level: Supervision/Verbal cueing Assistive device: Walker-rolling    Walk 150 feet activity   Assist Walk 150 feet activity did not occur: Safety/medical concerns  Assist level: Supervision/Verbal cueing Assistive device: Walker-rolling    Walk 10 feet on uneven surface  activity   Assist     Assist level: Contact Guard/Touching assist Assistive device: Aeronautical engineer Will patient use wheelchair at discharge?: No             Wheelchair 50 feet with 2 turns activity    Assist            Wheelchair 150 feet activity     Assist           Medical Problem List and Plan: 1.Functional and  mobility deficitssecondary to VF cardiac arrest and subsequent debility  Continue CIR 2. Antithrombotics: -DVT/anticoagulation:Pharmaceutical:Lovenox -antiplatelet therapy: DAPT 3.Chronic pain/Pain Management:Lidocaine/volatren gel to LBP -continue Neurontin bid and flexeril tid.  -voltaren gel to back and neck  Robaxin as needed started on 6/3  Monitor with increased exertion 4. Mood:LCSW to follow for evaluation and support. -antipsychotic agents: N/A 5. Neuropsych: This patientiscapable of making decisions on hisown behalf. 6. Skin/Wound Care:Routine pressure relief measures. -monitor incision sites, wounds 7. Fluids/Electrolytes/Nutrition:Monitor I/Os 8. 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 NO driving for 6 months.  --continue Coreg, ASA, Plavix and Lipitor.  --No heavy lifting or vigorous activity withtheleft arm for 6 to 8 weeks. Do not raise your left arm above your head for one week.  9. T2DM with hyperglycemia: Monitor BS ac/hs. Continue Levemir 38 units bid w/ novolog 6 units tid and slowly titrate up to home dose as required. --PTA was on Levemir 80 units HS with humalog 40/40/50 for meal coverage.  Discussed with pharmacy, home medication restarted  Labile on 6/3, monitor for trend,?  Stabilizing  Monitor with increased activity 10. Abnormal LFTs: Resolved 11. Leucocytosis: Resolved  12. Acute kidney injury  Creatinine 1.22 on 6/3   Encourage fluids  Continue to monitor 13.  Acute blood loss anemia  Hemoglobin 12.4 on 6/3   Continue to monitor 14.  Hypoalbuminemia  Supplement initiated on 6/1 15. OSA: CPAP ordered but patient did not tolerate given no humidifier. Continue [place] at night  16. Obesity BMI 37.85: educated that use of CPAP at night can also help with weight loss 17. Fatigue: discussed option of modafinil for fatigue if does not improve with CPAP  use.  18. Constipation: discussed various options, patient prefers to request wife bring in prunes from home. 19. Right flank pain: possibly 2/2 constipation. Patient concerned about liver given prior injury- reviewed LFTs with him, normal on last check. Change BMP tomorrow to CMP to continue to monitor.   LOS: 5 days A FACE TO FACE EVALUATION WAS PERFORMED  Clide Deutscher Anni Hocevar 06/07/2020, 12:16 PM

## 2020-06-07 NOTE — Plan of Care (Signed)
  Problem: Consults Goal: RH GENERAL PATIENT EDUCATION Description: See Patient Education module for education specifics. Outcome: Progressing   Problem: RH BOWEL ELIMINATION Goal: RH STG MANAGE BOWEL WITH ASSISTANCE Description: STG Manage Bowel with mod I Assistance. Outcome: Progressing Goal: RH STG MANAGE BOWEL W/MEDICATION W/ASSISTANCE Description: STG Manage Bowel with Medication with min Assistance. Outcome: Progressing   Problem: RH SAFETY Goal: RH STG ADHERE TO SAFETY PRECAUTIONS W/ASSISTANCE/DEVICE Description: STG Adhere to Safety Precautions With min Assistance/Device. Outcome: Progressing   Problem: RH PAIN MANAGEMENT Goal: RH STG PAIN MANAGED AT OR BELOW PT'S PAIN GOAL Description: At or below level 4 Outcome: Progressing   Problem: RH KNOWLEDGE DEFICIT GENERAL Goal: RH STG INCREASE KNOWLEDGE OF SELF CARE AFTER HOSPITALIZATION Description: Patient and wife will be able to manage care using handouts and educational resources independently Outcome: Progressing   Problem: Education: Goal: Individualized Educational Video(s) Outcome: Progressing   Problem: Cardiac: Goal: Ability to achieve and maintain adequate cardiopulmonary perfusion will improve Description: Patient and wife will be able to manage care at discharge using handouts and educational resources independently Outcome: Progressing

## 2020-06-08 LAB — COMPREHENSIVE METABOLIC PANEL
ALT: 37 U/L (ref 0–44)
AST: 22 U/L (ref 15–41)
Albumin: 2.9 g/dL — ABNORMAL LOW (ref 3.5–5.0)
Alkaline Phosphatase: 114 U/L (ref 38–126)
Anion gap: 7 (ref 5–15)
BUN: 19 mg/dL (ref 8–23)
CO2: 25 mmol/L (ref 22–32)
Calcium: 8.8 mg/dL — ABNORMAL LOW (ref 8.9–10.3)
Chloride: 106 mmol/L (ref 98–111)
Creatinine, Ser: 1.28 mg/dL — ABNORMAL HIGH (ref 0.61–1.24)
GFR, Estimated: >60 mL/min (ref >60)
Glucose, Bld: 156 mg/dL — ABNORMAL HIGH (ref 70–99)
Potassium: 4 mmol/L (ref 3.5–5.1)
Sodium: 138 mmol/L (ref 135–145)
Total Bilirubin: 0.7 mg/dL (ref 0.3–1.2)
Total Protein: 6.7 g/dL (ref 6.5–8.1)

## 2020-06-08 LAB — GLUCOSE, CAPILLARY
Glucose-Capillary: 149 mg/dL — ABNORMAL HIGH (ref 70–99)
Glucose-Capillary: 174 mg/dL — ABNORMAL HIGH (ref 70–99)
Glucose-Capillary: 147 mg/dL — ABNORMAL HIGH (ref 70–99)
Glucose-Capillary: 220 mg/dL — ABNORMAL HIGH (ref 70–99)

## 2020-06-08 MED ORDER — INSULIN DETEMIR 100 UNIT/ML ~~LOC~~ SOLN
40.0000 [IU] | Freq: Two times a day (BID) | SUBCUTANEOUS | Status: DC
  Administered 2020-06-08 – 2020-06-10 (×4): 40 [IU] via SUBCUTANEOUS
  Filled 2020-06-08 (×6): qty 0.4

## 2020-06-08 NOTE — Progress Notes (Signed)
Occupational Therapy Session Note  Patient Details  Name: [name] MRN: [id number] Date of Birth: 1955/02/16  Today's Date: 06/08/2020 OT Individual Time: 1000-1100 OT Individual Time Calculation (min): 60 min    Short Term Goals: Week 1:  OT Short Term Goal 1 (Week 1): STGs=LTGs  Skilled Therapeutic Interventions/Progress Updates:    Pt sitting up in recliner, reports feeling tired from PT but motivated to continue working with OT "I wanna get better so I can get home".  Pt expressing some concern about learning how to cook and eat using a heart healthy diet with desire to take it more seriously when he goes home.  Pt completed all functional mobility with supervision including sit<>stand at recliner ambulation from room to ortho gym approximately 175 feet without rest break using RW, and sit<>stand at nustep seat and EOM.  Pt completed nustep resistance level 4 at intervals of 4 minutes x 3 trials needing min duration breaks in between.  Assessed SPO2 which remained above 98% and pulse range 82-91 bpm throughout.  Pt participated in dynamic standing task with light functional reach component to facilitate leaning outside BOS in different directions all completed with supervision. During seated rest breaks, educated pt on heart healthy cooking techniques including use of olive oils instead of butter, baking/steaming/use of air fryer instead of deep frying foods, and choosing heart healthy options including frozen/fresh produce instead of canned.  Pt ambulated back to room approximately 175 feet using RW with supervision.  Pt sitting in recliner at end of session, call bell in reach.    Therapy Documentation Precautions:  Precautions Precautions: Fall,ICD/Pacemaker Restrictions Weight Bearing Restrictions: No Other Position/Activity Restrictions: pacemaker precautions   Therapy/Group: Individual Therapy  [name] 06/08/2020, 12:10 PM

## 2020-06-08 NOTE — Progress Notes (Signed)
Patient refused CPAP for the night. Patient wearing oxygen set at 2lpm with Sp02=96%

## 2020-06-08 NOTE — Progress Notes (Signed)
[place] PHYSICAL MEDICINE & REHABILITATION PROGRESS NOTE  Subjective/Complaints:  Pt refusing CPAP- using O2, since cannot tolerate without humidifier.  Pt reports still having BMs, but they are hard- not quite as much as he would want-  Taking oxycodone for pain prn.  Also taking Colace- doesn't want anything laxative- taking prunes from home.  R flank pain a little less- sounds like it could be from cracked ribs from CPR, maybe made worse by constipation.  Was not interested in lidoderm- has some at home- they don't stay in place.    ROS:  Pt denies SOB, abd pain, CP, N/V/C/D, and vision changes   Objective: Vital Signs: Blood pressure (!) 103/57, pulse 75, temperature 97.9 F (36.6 C), temperature source Oral, resp. rate 20, height 6' (1.829 m), weight 126.6 kg, SpO2 97 %. No results found. No results for input(s): WBC, HGB, HCT, PLT in the last 72 hours. Recent Labs    06/08/20 0626  NA 138  K 4.0  CL 106  CO2 25  GLUCOSE 156*  BUN 19  CREATININE 1.28*  CALCIUM 8.8*    Intake/Output Summary (Last 24 hours) at 06/08/2020 0948 Last data filed at 06/08/2020 0726 Gross per 24 hour  Intake 1440 ml  Output 1150 ml  Net 290 ml        Physical Exam: BP (!) 103/57 (BP Location: Right Arm)   Pulse 75   Temp 97.9 F (36.6 C) (Oral)   Resp 20   Ht 6' (1.829 m)   Wt 126.6 kg   SpO2 97%   BMI 37.85 kg/m      General: awake, alert, appropriate, pt sitting up in bedside chair, NAD HENT: conjugate gaze; oropharynx moist CV: regular rate; no JVD Pulmonary: CTA B/L; no W/R/R- good air movement GI: soft, NT, ND, (+)BS- maybe protuberant vs slightly distended; hypoactive Psychiatric: appropriate; cordial Neurological: alert   Improved affect. Musc: TTP over edge of R lower ribs- but pt reports is "somewhat better".  Neuro: Alert Sensation diminished to light touch distal to mid calf bilaterally Motor: Bilateral upper extremities: 5/5 proximal distal Bilateral  lower extremity: Flexion, knee extension 4 +/5, ankle dorsiflexion 4+/5, stable   Assessment/Plan: 1. Functional deficits which require 3+ hours per day of interdisciplinary therapy in a comprehensive inpatient rehab setting.  Physiatrist is providing close team supervision and 24 hour management of active medical problems listed below.  Physiatrist and rehab team continue to assess barriers to discharge/monitor patient progress toward functional and medical goals   Care Tool:  Bathing    Body parts bathed by patient: Right arm,Left arm,Chest,Front perineal area,Abdomen,Buttocks,Right upper leg,Left upper leg,Face,Right lower leg,Left lower leg         Bathing assist Assist Level: Set up assist     Upper Body Dressing/Undressing Upper body dressing   What is the patient wearing?: Pull over shirt    Upper body assist Assist Level: Independent    Lower Body Dressing/Undressing Lower body dressing      What is the patient wearing?: Pants,Underwear/pull up     Lower body assist Assist for lower body dressing: Independent with assitive device     Toileting Toileting    Toileting assist Assist for toileting: Minimal Assistance - Patient > 75%     Transfers Chair/bed transfer  Transfers assist     Chair/bed transfer assist level: Supervision/Verbal cueing     Locomotion Ambulation   Ambulation assist      Assist level: Supervision/Verbal cueing Assistive device: Walker-rolling  Max distance: 150   Walk 10 feet activity   Assist     Assist level: Supervision/Verbal cueing Assistive device: Walker-rolling   Walk 50 feet activity   Assist    Assist level: Supervision/Verbal cueing Assistive device: Walker-rolling    Walk 150 feet activity   Assist Walk 150 feet activity did not occur: Safety/medical concerns  Assist level: Supervision/Verbal cueing Assistive device: Walker-rolling    Walk 10 feet on uneven surface   activity   Assist     Assist level: Contact Guard/Touching assist Assistive device: Walker-rolling   Wheelchair     Assist Will patient use wheelchair at discharge?: No             Wheelchair 50 feet with 2 turns activity    Assist            Wheelchair 150 feet activity     Assist           Medical Problem List and Plan: 1.Functional and mobility deficitssecondary to VF cardiac arrest and subsequent debility  con't PT and OT/CIR 2. Antithrombotics: -DVT/anticoagulation:Pharmaceutical:Lovenox -antiplatelet therapy: DAPT 3.Chronic pain/Pain Management:Lidocaine/volatren gel to LBP -continue Neurontin bid and flexeril tid.  -voltaren gel to back and neck  Robaxin as needed started on 6/3  6/6- R lower rib pain- already on oxycodone 5-10 mg q4 hours prn- won't stop- not interested in lidoderm for rib pain- con't regimen   Monitor with increased exertion 4. Mood:LCSW to follow for evaluation and support. -antipsychotic agents: N/A 5. Neuropsych: This patientiscapable of making decisions on hisown behalf. 6. Skin/Wound Care:Routine pressure relief measures. -monitor incision sites, wounds 7. Fluids/Electrolytes/Nutrition:Monitor I/Os 8. 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 NO driving for 6 months.  --continue Coreg, ASA, Plavix and Lipitor.  --No heavy lifting or vigorous activity withtheleft arm for 6 to 8 weeks. Do not raise your left arm above your head for one week.  9. T2DM with hyperglycemia: Monitor BS ac/hs. Continue Levemir 38 units bid w/ novolog 6 units tid and slowly titrate up to home dose as required. --PTA was on Levemir 80 units HS with humalog 40/40/50 for meal coverage.  Discussed with pharmacy, home medication restarted  Labile on 6/3, monitor for trend,?  Stabilizing  6/6- BG [id number]- has been better in last 18 hours,  was high prior- wll increase Levemir to 40 mg BID and con't meal coverage- con't to monitor  Monitor with increased activity 10. Abnormal LFTs: Resolved 11. Leucocytosis: Resolved  12. Acute kidney injury  Creatinine 1.22 on 6/3   Encourage fluids  Continue to monitor 13.  Acute blood loss anemia  Hemoglobin 12.4 on 6/3   Continue to monitor 14.  Hypoalbuminemia  Supplement initiated on 6/1 15. OSA: CPAP ordered but patient did not tolerate given no humidifier. Continue [place] at night  16. Obesity BMI 37.85: educated that use of CPAP at night can also help with weight loss  6/6- not using since doesn't have humidifier- using O2 only at night- con't to encourage CPAp usage 17. Fatigue: discussed option of modafinil for fatigue if does not improve with CPAP use.  18. Constipation: discussed various options, patient prefers to request wife bring in prunes from home.  6/6- pt on colace- refuses laxative- con't his regimen per his request.  19. Right flank pain: possibly 2/2 constipation. Patient concerned about liver given prior injury- reviewed LFTs with him, normal on last check. Change BMP tomorrow to CMP to continue to monitor.   6/6- LFTs good- flank  pain could be from lower rib pain from CPR- con't regimen LOS: 6 days A FACE TO FACE EVALUATION WAS PERFORMED  Kathleen Tamm 06/08/2020, 9:48 AM

## 2020-06-08 NOTE — Progress Notes (Signed)
Physical Therapy Session Note  Patient Details  Name: [name] MRN: [id number] Date of Birth: Jun 20, 1955  Today's Date: 06/08/2020 PT Individual Time: 4327-6147 + 0929-5747  PT Individual Time Calculation (min): 56 min  + 58 min  Short Term Goals: Week 1:  PT Short Term Goal 1 (Week 1): STG = LTG due to ELOS  Skilled Therapeutic Interventions/Progress Updates:     1st session: Pt greeted seated in recliner, awake and agreeable to PT session - motivated to walk as he had the day off yesterday. Sit<>stand with supervision and no AD from recliner height and able to adjust items on bed with distant supervision. Ambulatory transfer to w/c with supervision and no AD and wheeled to main rehab gym for time management. Sp02 97% resting on RA. Gait training 391ft with close supervision and RW - minimal gait deficits noted but increased dyspnea after >268ft - education on energy conservation and PLB for paced activity. SpO2 92% on RA after gait and needed extended seated rest break to raise back to 98%. Instructed on 6MWT and purpose.  6MWT = 459ft with RW and close supervision. Seated rest break needed at minute 4:30 due to fatigue. SpO2 94% with HR 88 at completion. Age norm for male 61-69 = 1834ft.   Gait training 16ft + 129ft  with no AD and close supervision with 1.5# ankle weights to both ankles. Then instructed on repeated step up/downs onto 1st 6inch step with bilateral hand rails with 1.5# ankle weights remaining on bilateral ankles - 2 sets of 45 seconds with seated rest break b/w sets.   Wheeled back to room in w/c for energy conservation and completed ambulatory transfer in room with supervision and no AD to recliner. Chair alarm set and handoff of care to NT who was in the room. All needs met.     2nd session: Pt greeted seated in recliner, agreeable to PT session and denies any pain. Sit<>Stand with supervision and no AD from recliner and ambulated with no AD and supervision to ortho  gym, ~228ft. Pt demonstrating mild impulsivity with tasks this session and needed ed on safety precautions/awareness. Completed BUE ergometer while standing to work on standing balance/tolerance and global endurance training. Completed 5 min + 5 min in ccw/cw directions with workload set to 3.5. SpO2 and HR - 94% and 105bpm. Next, completed worked on standing balance with compliant blue air-ex foam pad with task overlay for bicep curls + shldr raises with 5# dowel rod to challenge balance and BUE strengthening - completed 2x20 reps (seated rest). Next, with 4# ankle weights attached to each ankle, ambulated ~2x14ft with CGA and no AD on level ground + 76ft on mulch + 10 ft on ramp - cues for safety awareness and precautions with unlevel surfaces. Lastly, completed standing there-ex with 4# ankle weights and BUE support to // bar - 1x15 for each: hamstring curls, hip extension, and toe taps to 4inch platform. Wheeled back to room for energy conservation in his w/c and completed stand<>pivot transfer with CGA and no AD to recliner. Chair alarm activated and needs within reach at end of session. Pt updated on upcoming DC date Thursday, 6/9 - pt pleased.  Therapy Documentation Precautions:  Precautions Precautions: Fall,ICD/Pacemaker Restrictions Weight Bearing Restrictions: No Other Position/Activity Restrictions: pacemaker precautions General:    Therapy/Group: Individual Therapy  Lauraann Missey P Eevie Lapp PT 06/08/2020, 7:36 AM

## 2020-06-08 NOTE — Progress Notes (Signed)
Patient ID: [name], male   DOB: [birth year], 65 y.o.   MRN: [id number]  Met with pot to inform team feels will be ready for discharge Thursday 6/9 will meet goals and MD is in agreement with also. Discussed follow up OPPT and he would like to be closer to home and go to [facility] for [facility]. No equipment needs. Will fax order and ask to contact wife to set up appointment. Continue to follow until Thursday.

## 2020-06-08 NOTE — Progress Notes (Signed)
Physical Therapy Session Note  Patient Details  Name: MATILDE MARKIE MRN: [id number] Date of Birth: [birth year]  Today's Date: 06/08/2020 PT Individual Time: 1101-1130 PT Individual Time Calculation (min): 29 min   Short Term Goals: Week 1:  PT Short Term Goal 1 (Week 1): STG = LTG due to ELOS  Skilled Therapeutic Interventions/Progress Updates: Pt presented sitting in recliner and agreeable to therapy.  Pt transferred sit to stand w/ independence w/o AD.  Pt amb multiple trials w/o AD at least 150' per trial w/ supervision.  Pt O2 sats remained > 95% on RA post- gait trials throughout session.  Pt performed stepping over hockey sticks and 6" platform and stepping on 6" platform and Airex cushion.  Pt returned to room and sat in recliner w/ all needs in reach.     Therapy Documentation Precautions:  Precautions Precautions: Fall,ICD/Pacemaker Restrictions Weight Bearing Restrictions: No Other Position/Activity Restrictions: pacemaker precautions General:   Vital Signs:  Pain:pt states just received pain meds. Pain Assessment Pain Scale: 0-10 Pain Score: 8  Pain Type: Acute pain Pain Location: Rib cage Pain Orientation: Right Pain Descriptors / Indicators: Aching Pain Frequency: Intermittent Pain Onset: With Activity Pain Intervention(s): Medication (See eMAR) Mobility:        Therapy/Group: Individual Therapy  Ladoris Gene 06/08/2020, 11:33 AM

## 2020-06-09 LAB — GLUCOSE, CAPILLARY
Glucose-Capillary: 142 mg/dL — ABNORMAL HIGH (ref 70–99)
Glucose-Capillary: 159 mg/dL — ABNORMAL HIGH (ref 70–99)
Glucose-Capillary: 217 mg/dL — ABNORMAL HIGH (ref 70–99)
Glucose-Capillary: 290 mg/dL — ABNORMAL HIGH (ref 70–99)

## 2020-06-09 MED ORDER — KETOTIFEN FUMARATE 0.025 % OP SOLN: 1.0000 [drp] | Freq: Three times a day (TID) | OPHTHALMIC | 0 refills | Status: DC | PRN

## 2020-06-09 NOTE — Discharge Summary (Signed)
Physician Discharge Summary  Patient ID: [name] MRN: [id number] DOB/AGE: November 27, 1955 65 y.o.  Admit date: 06/02/2020 Discharge date: 06/10/2020  Discharge Diagnoses:  Principal Problem:   Physical debility Active Problems:   Hypoalbuminemia due to protein-calorie malnutrition (HCC)   Acute blood loss anemia   Controlled type 2 diabetes mellitus with hyperglycemia, with long-term current use of insulin (HCC)   Chronic pain syndrome   AKI (acute kidney injury) [facility])   Discharged Condition: stable   Significant Diagnostic Studies: N/A  Labs:  Basic Metabolic Panel: Recent Labs  Lab 06/05/20 0753 06/08/20 0626  NA 135 138  K 4.2 4.0  CL 101 106  CO2 23 25  GLUCOSE 325* 156*  BUN 24* 19  CREATININE 1.22 1.28*  CALCIUM 8.4* 8.8*    CBC: CBC Latest Ref Rng & Units 06/05/2020 06/03/2020 06/01/2020  WBC 4.0 - 10.5 K/uL 7.3 6.6 11.9(H)  Hemoglobin 13.0 - 17.0 g/dL 12.4(L) 11.9(L) 13.7  Hematocrit 39.0 - 52.0 % 38.9(L) 37.2(L) 42.4  Platelets 150 - 400 K/uL 289 283 366    CBG: Recent Labs  Lab 06/09/20 0536 06/09/20 1114 06/09/20 1632 06/09/20 1954 06/10/20 0619  GLUCAP 142* 159* 217* 290* 115*    Brief HPI:   [name] is a 65 y.o. male with history of HTN, RBBB, T2DM, chronic low back pain who was admitted on 05/20/2018 via [facility] with V. fib cardiac arrest and 30 minutes of CPR prior to ROSC.  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  He was started on amiodarone drip and cardiogenic shock felt to be secondary to septic shock from aspiration pneumonia.  He was treated with IV antibiotics as well as multiple pressors.    Hospital course significant for issues with agitation, AKI with shock liver as well as fluid overload.  He had recurrent 7-second complete heart block requiring dobutamine and amiodarone was discontinued.  Repeat limited echo showed normal RV  function and no wall abnormality.  He underwent dual-chamber ICD implantation by Dr. Quentin Ore on 05/26 with recommendations of no driving x6 months.  He has blood pressures were improving off of Prinzide and thrombocytopenia was resolving.  Patient continue be limited by chest wall pain with weakness as well as balance deficits.  CIR was recommended due to functional decline.   Hospital Course: AEMON KOELLER was admitted to rehab 06/02/2020 for inpatient therapies to consist of PT and OT at least three hours five days a week. Past admission physiatrist, therapy team and rehab RN have worked together to provide customized collaborative inpatient rehab.  His blood pressures were monitored on TID basis and has been improving. His diabetes has been monitored with ac/hs CBG checks and SSI was use prn for tighter BS control.  Levemir was titrated up to 40 mg twice daily with meal coverage.  He was advised to continue monitoring blood sugars on a daily basis and follow-up with PCP for further adjustment.  He has refused to wear CPAP and has been using oxygen instead at bedtime.  He reports that he plans to resume his home CPAP after discharge.  Follow-up check of CBC showed acute blood loss anemia to be improving.  Protein supplements added to help with low protein stores but he has decline this. Left ICD incision is clean dry and intact and is healing well.  Leukocytosis has resolved.  Check of electrolytes shows showed  rise in creatinine showed normalization of BUN and serum creatinine is improved overall from 2.34-1.28.  Potassium levels are stable on current dose of Kdur. He has had issues with constipation and bowel program has been adjusted to help manage symptoms.  He has been educated on high-fiber foods as well as carb modified/heart healthy diet.  He has also been educated on not driving for 6 months or till cleared by cardiology.  He has progressed to modified independent level and will continue to receive  follow-up outpatient PT at [facility] course: During patient's stay in rehab weekly team conferences were held to monitor patient's progress, set goals and discuss barriers to discharge. At admission, patient required min assist with ADL tasks and with mobility. He  has had improvement in activity tolerance, balance, postural control as well as ability to compensate for deficits.   He is able to complete ADL tasks at modified independent level.  He requires supervision for shower transfers.  He is modified independent for transfers and to ambulate 300 feet with rolling walker.  Wife works from home and is able to provide assistance as needed after discharge.  Disposition: Home  Diet: Heart healthy/carb modified.  Special Instructions: 1.  Monitor blood sugars AC at bedtime and follow-up with PCP for further adjustment in insulin. 2.  No driving for 6 months due to cardiac arrest. 3. Repeat BMET in one week to monitor renal status and potassium levels.    Discharge Instructions    Ambulatory referral to Physical Medicine Rehab   Complete by: As directed      Allergies as of 06/10/2020      Reactions   Toradol [ketorolac Tromethamine] Anaphylaxis   Codeine    REACTION: itch   Morphine And Related Nausea And Vomiting   Naproxen    REACTION: anaphylaxis      Medication List    STOP taking these medications   Fish Oil 1000 MG Caps   insulin aspart 100 UNIT/ML injection Commonly known as: novoLOG     TAKE these medications   aspirin EC 81 MG tablet Take 81 mg by mouth daily. Swallow whole. Notes to patient: Purchase over the counter   atorvastatin 80 MG tablet Commonly known as: LIPITOR Take 1 tablet (80 mg total) by mouth daily.   carboxymethylcellulose 0.5 % Soln Commonly known as: REFRESH PLUS Place 1 drop into both eyes 3 (three) times daily as needed (dry eyes).   carvedilol 3.125 MG tablet Commonly known as: COREG Take 1 tablet (3.125 mg total) by mouth  2 (two) times daily with a meal.   clopidogrel 75 MG tablet Commonly known as: PLAVIX Take 1 tablet (75 mg total) by mouth daily.   cyclobenzaprine 5 MG tablet Commonly known as: FLEXERIL Take 1 tablet (5 mg total) by mouth 3 (three) times daily.   diclofenac sodium 1 % Gel Commonly known as: VOLTAREN Apply 4 g topically 3 (three) times daily as needed (pain).   docusate sodium 100 MG capsule Commonly known as: COLACE Take 1 capsule (100 mg total) by mouth 2 (two) times daily.   empagliflozin 10 MG Tabs tablet Commonly known as: JARDIANCE Take 1 tablet (10 mg total) by mouth daily. Start taking on: June 11, 2020 What changed:   medication strength  how much to take   gabapentin 100 MG capsule Commonly known as: NEURONTIN Take 2 capsules (200 mg total) by mouth 2 (two) times daily.   insulin detemir 100 UNIT/ML injection Commonly  known as: LEVEMIR Inject 0.4 mLs (40 Units total) into the skin 2 (two) times daily. What changed: how much to take   ketotifen 0.025 % ophthalmic solution Commonly known as: ZADITOR Place 1 drop into both eyes 3 (three) times daily as needed (itchy eyes).   lidocaine 5 % Commonly known as: LIDODERM Place 1 patch onto the skin daily as needed (pain). Notes to patient: Over the counter   loratadine 10 MG tablet Commonly known as: CLARITIN Take 10 mg by mouth daily.   multivitamin with minerals tablet Take 1 tablet by mouth daily.   nitroGLYCERIN 0.4 MG SL tablet Commonly known as: NITROSTAT Place 1 tablet (0.4 mg total) under the tongue every 5 (five) minutes x 3 doses as needed for chest pain. Call MD if you need two or more doses   oxyCODONE 5 MG immediate release tablet Commonly known as: Oxy IR/ROXICODONE Take 1-2 tablets (5-10 mg total) by mouth every 12 (twelve) hours as needed for moderate pain. What changed: when to take this   pantoprazole 40 MG tablet Commonly known as: PROTONIX Take 40 mg by mouth 2 (two) times daily  before a meal.   potassium chloride SA 20 MEQ tablet Commonly known as: KLOR-CON Take 1 tablet (20 mEq total) by mouth daily.       Follow-up Information    Raulkar, Clide Deutscher, MD Follow up.   Specialty: Physical Medicine and Rehabilitation Why: 07/16/20 please arrive at 9:20 for 9:40am appointment, thank you! Contact information: [address] [place] [address] [phone]        Vickie Epley, MD Follow up.   Specialties: Cardiology, Radiology Contact information: [place] [address] [phone]        Clinic, [facility] Call.   Why: for post hospital follow up Contact information: [address] [place] [address] [phone]        Belva Crome, MD .   Specialty: Cardiology Contact information: [phone] [address] [place] [address] [phone]               Signed: Bary Leriche 06/10/2020, 10:24 PM

## 2020-06-09 NOTE — Discharge Summary (Signed)
Physical Therapy Discharge Summary  Patient Details  Name: [name] MRN: [id number] Date of Birth: January 13, 1955  Today's Date: 06/09/2020 PT Individual Time: 1000-1100 PT Individual Time Calculation (min): 60 min    Patient has met 9 of 9 long term goals due to improved activity tolerance, improved balance, improved postural control and increased strength.  Patient to discharge at an ambulatory level Modified Independent.   Patient's care partner is independent to provide the necessary physical assistance at discharge.  Reasons goals not met: N/A  Recommendation:  Patient will benefit from ongoing skilled PT services in outpatient setting to continue to advance safe functional mobility, address ongoing impairments in dynamic standing balance and general deconditioning in order to minimize fall risk.  Equipment: No equipment provided  Reasons for discharge: treatment goals met and discharge from hospital  Patient/family agrees with progress made and goals achieved: Yes  PT Discharge Precautions/Restrictions Precautions Precautions: Fall;ICD/Pacemaker Restrictions Weight Bearing Restrictions: No Other Position/Activity Restrictions: pacemaker precautions Vision/Perception  Perception Perception: Within Functional Limits Praxis Praxis: Intact  Cognition Overall Cognitive Status: Within Functional Limits for tasks assessed Arousal/Alertness: Awake/alert Orientation Level: Oriented X4 Attention: Focused;Sustained Focused Attention: Appears intact Sustained Attention: Appears intact Memory: Appears intact Awareness: Appears intact Problem Solving: Appears intact Safety/Judgment: Appears intact Sensation Sensation Light Touch: Impaired Detail Peripheral sensation comments: chronic decreased sensation fingers L Hot/Cold: Appears Intact Proprioception: Appears Intact Stereognosis: Appears Intact Coordination Gross Motor Movements are Fluid and Coordinated: Yes Fine  Motor Movements are Fluid and Coordinated: Yes Motor  Motor Motor: Within Functional Limits. Impacted by global deconditioning Mobility Bed Mobility Bed Mobility: Not assessed (pt sleeps in recliner, plans to sleep in recliner at home) Transfers Transfers: Sit to Stand;Stand to Sit;Stand Pivot Transfers Sit to Stand: Independent Stand to Sit: Independent Stand Pivot Transfers: Independent Transfer (Assistive device): None Locomotion  Gait Ambulation: Yes Gait Assistance: Independent with assistive device. (Supervision with no AD) Gait Distance (Feet): 300 Feet Assistive device: Rolling walker Gait Gait: Yes Gait Pattern: Impaired Gait Pattern: Step-through pattern;Within Functional Limits;Wide base of support;Trunk flexed High Level Ambulation High Level Ambulation: Side stepping Side Stepping: Supervision with no AD along level ground Stairs / Additional Locomotion Stairs: Yes Stairs Assistance: Supervision/Verbal cueing Stair Management Technique: Two rails Number of Stairs: 12 Height of Stairs: 6 Ramp: Supervision/Verbal cueing Curb: Contact Guard/Touching assist Wheelchair Mobility Wheelchair Mobility: No  Trunk/Postural Assessment  Cervical Assessment Cervical Assessment: Within Functional Limits (forward head) Thoracic Assessment Thoracic Assessment: Within Functional Limits (rounded shoulders) Lumbar Assessment Lumbar Assessment: Within Functional Limits (post. pelvic tilt) Postural Control Postural Control: Within Functional Limits  Balance Balance Balance Assessed: Yes Static Sitting Balance Static Sitting - Balance Support: Feet supported Static Sitting - Level of Assistance: 7: Independent Dynamic Sitting Balance Dynamic Sitting - Balance Support: Feet supported Dynamic Sitting - Level of Assistance: 7: Independent Static Standing Balance Static Standing - Balance Support: No upper extremity supported Static Standing - Level of Assistance: 6:  Modified independent (Device/Increase time) Dynamic Standing Balance Dynamic Standing - Balance Support: During functional activity Dynamic Standing - Level of Assistance: 6: Modified independent (Device/Increase time) Extremity Assessment  RLE Assessment RLE Assessment: Within Functional Limits General Strength Comments: Grossy 5/5 LLE Assessment LLE Assessment: Within Functional Limits General Strength Comments: Grossly 5/5  Skilled Intervention: Pt greeted seated in recliner - agreeable to PT tx. No reports of pain. Discussion regarding DC planning, home safety, and role of f/u therapies - all questions/concerns addressed and pt reports feeling confident for upcoming DC  home where wife is available to assist. Pt also appreciative of therapies during his rehab stay. Sit<>stand indep with no AD and ambulated with supervision and no AD to ortho gym. Completed car transfer indep with car height set to simulate his wife's. Required supervision for ambulating up/down ramp and CGA for ambulating on unlevel mulch. With RW, pt is mod I with gait and encouraged pt to use RW while ambulating without supervision at home and pt voiced understanding. Pt completed furniture transfers in ADL apartment room with supervision from low sitting sofa couch - pt reports this is improvement since baseline. Reviewed psychosocial factors regarding DC planning as well as family support and pt appreciative. He was able to perform 12 steps with close supervision and 2 hand rails with self selected reciprocal stepping. 5xSTS performed unsupported from low sitting mat table, 11.5 seconds, 11.5 seconds, and 12 seconds with each (3) trials. Pt ambulated back to his room with supervision and no AD and ended session seated in recliner with needs in reach, pt made comfortable.   Willodean Leven P Emani Morad PT 06/09/2020, 11:02 AM

## 2020-06-09 NOTE — Progress Notes (Signed)
Occupational Therapy Session Note  Patient Details  Name: [name] MRN: [id number] Date of Birth: 09/02/1955  Today's Date: 06/09/2020 OT Individual Time: 0831-0900 OT Individual Time Calculation (min): 29 min    Short Term Goals: Week 1:  OT Short Term Goal 1 (Week 1): STGs=LTGs  Skilled Therapeutic Interventions/Progress Updates:    Pt received in recliner in room and consented to OT tx. Pt seen for functional mobility, UE strengthening, and activity tolerance training this day. Instructed in HEP with 5# db, exercises included bicep curls (both R and L hand) for 3x15, shoulder press RUE only for 3x15, chest press RUE only for 1x10, and shoulder flexion to 90* RUE only for 2x10 with min rest breaks due to fatigue. Pt walked from room to gym and back with no device and did not require cuing for safety. Pt's VSS after each walk and during exercises. After tx, pt left up in recliner with all needs met. MD reports pt will be going home tomorrow as opposed to Thursday.  Therapy Documentation Precautions:  Precautions Precautions: Fall,ICD/Pacemaker Restrictions Weight Bearing Restrictions: No Other Position/Activity Restrictions: pacemaker precautions  Pain: Pain Assessment Pain Scale: 0-10 Pain Score: 2    Therapy/Group: Individual Therapy  [name] 06/09/2020, 8:59 AM

## 2020-06-09 NOTE — Progress Notes (Signed)
Occupational Therapy Discharge Summary  Patient Details  Name: [name] MRN: [id number] Date of Birth: 1955-05-19    Patient has met 10 of 10 long term goals due to improved activity tolerance, improved balance and ability to compensate for deficits.  Patient to discharge at overall Modified Independent level. It is recommended that pt have supervision during shower transfers for safety upon initial discharge home. Pt requires min A for posterior hygiene after toileting, however this is his baseline as he uses a bidet at home. Educated in ECTs at home, reminded of ICD/PM precautions. Patient's care partner is independent to provide the necessary physical assistance at discharge, works from home and will be home with him 24/7.  Pt reports he is ready to go home and has no concerns for discharge tomorrow.   Reasons goals not met: n/a  Recommendation:  Patient will benefit from ongoing skilled OT services in home health setting to continue to advance functional skills in the area of BADL and iADL.  Equipment: No equipment provided  Reasons for discharge: treatment goals met and discharge from hospital  Patient/family agrees with progress made and goals achieved: Yes  OT Discharge Precautions/Restrictions  Precautions Precautions: Fall;ICD/Pacemaker Restrictions Weight Bearing Restrictions: No Other Position/Activity Restrictions: pacemaker precautions Pain   none ADL ADL Eating: Independent Where Assessed-Eating: Chair Grooming: Independent Where Assessed-Grooming: Sitting at sink Upper Body Bathing: Setup Where Assessed-Upper Body Bathing: Shower Lower Body Bathing: Setup Where Assessed-Lower Body Bathing: Shower Upper Body Dressing: Independent Where Assessed-Upper Body Dressing: Chair Lower Body Dressing: Modified independent Where Assessed-Lower Body Dressing: Chair Toileting: Modified independent Where Assessed-Toileting: Glass blower/designer: Midwife Method: Counselling psychologist: Energy manager: Close supervision Social research officer, government Method: Heritage manager: Civil engineer, contracting with back,Grab bars Vision Baseline Vision/History: Wears glasses Wears Glasses: Reading only Patient Visual Report: No change from baseline Vision Assessment?: No apparent visual deficits Perception  Perception: Within Functional Limits Praxis Praxis: Intact Cognition Overall Cognitive Status: Within Functional Limits for tasks assessed Arousal/Alertness: Awake/alert Orientation Level: Oriented X4 Attention: Focused;Sustained Focused Attention: Appears intact Sustained Attention: Appears intact Memory: Appears intact Awareness: Appears intact Problem Solving: Appears intact Safety/Judgment: Appears intact Sensation Sensation Light Touch: Impaired Detail Peripheral sensation comments: chronic decreased sensation fingers L Hot/Cold: Appears Intact Proprioception: Appears Intact Stereognosis: Appears Intact Coordination Gross Motor Movements are Fluid and Coordinated: Yes Fine Motor Movements are Fluid and Coordinated: Yes Motor  Motor Motor: Within Functional Limits Mobility  Bed Mobility Bed Mobility: Not assessed (pt sleeps in recliner, plans to sleep in recliner at home) Transfers Sit to Stand: Independent Stand to Sit: Independent  Trunk/Postural Assessment  Cervical Assessment Cervical Assessment: Exceptions to [facility] (fwd head) Thoracic Assessment Thoracic Assessment: Exceptions to [facility] (rounded shoulders) Lumbar Assessment Lumbar Assessment: Exceptions to [facility] (posterior pelvic tilt) Postural Control Postural Control: Within Functional Limits  Balance Balance Balance Assessed: Yes Static Sitting Balance Static Sitting - Balance Support: Feet supported Static Sitting - Level of Assistance: 7: Independent Dynamic Sitting Balance Dynamic Sitting -  Balance Support: Feet supported Dynamic Sitting - Level of Assistance: 7: Independent Static Standing Balance Static Standing - Balance Support: No upper extremity supported Static Standing - Level of Assistance: 6: Modified independent (Device/Increase time) Extremity/Trunk Assessment RUE Assessment RUE Assessment: Within Functional Limits LUE Assessment LUE Assessment: Within Functional Limits General Strength Comments: WFL within ICD/PM precautions   [employer] 06/09/2020, 12:34 PM

## 2020-06-09 NOTE — Progress Notes (Addendum)
Patient ID: [name], male   DOB: 03-12-1955, 65 y.o.   MRN: [id number] Pt wants to go home tomorrow instead of Thursday. MD in agreement awaiting team input. Discharge arrangements in place. Aware of team conference update and agreeable to this.

## 2020-06-09 NOTE — Progress Notes (Signed)
Physical Therapy Session Note  Patient Details  Name: [name] MRN: [id number] Date of Birth: April 28, 1955  Today's Date: 06/09/2020 PT Individual Time: 1300-1325 PT Individual Time Calculation (min): 25 min   Short Term Goals: Week 1:  PT Short Term Goal 1 (Week 1): STG = LTG due to ELOS  Skilled Therapeutic Interventions/Progress Updates:    Pt received seated in recliner in room, agreeable to PT session. No complaints of pain, does report feeling fatigued. Sit to stand with Supervision and no AD throughout session. Ambulation 2 x 300 ft with no AD at Supervision level. Pt on room air throughout session, SpO2 at 96% with activity. Provided pt with HEP, see below:  Access Code: [email or web address] URL: [email or web address] Date: 06/09/2020 Prepared by: Excell Seltzer  Exercises Standing March with Counter Support - 1 x daily - 7 x weekly - 3 sets - 10 reps Standing Hip Abduction with Counter Support - 1 x daily - 7 x weekly - 3 sets - 10 reps Heel rises with counter support - 1 x daily - 7 x weekly - 3 sets - 10 reps Standing Knee Flexion AROM with Chair Support - 1 x daily - 7 x weekly - 3 sets - 10 reps Mini Squat with Counter Support - 1 x daily - 7 x weekly - 3 sets - 10 reps  Encouraged pt to obtain 3-5# ankle weights for use while performing HEP. Pt left seated in recliner in room with needs in reach at end of session.  Therapy Documentation Precautions:  Precautions Precautions: Fall,ICD/Pacemaker Restrictions Weight Bearing Restrictions: No Other Position/Activity Restrictions: pacemaker precautions   Therapy/Group: Individual Therapy   Excell Seltzer, PT, DPT, CSRS  06/09/2020, 1:29 PM

## 2020-06-09 NOTE — Patient Care Conference (Signed)
Inpatient RehabilitationTeam Conference and Plan of Care Update Date: 06/09/2020   Time: 13:40 PM    Patient Name: [name]      Medical Record Number: [id number]  Date of Birth: 07-19-1955 Sex: Male         Room/Bed: 4M06C/4M06C-01 Payor Info: Payor: MEDICARE / Plan: MEDICARE PART A AND B / Product Type: *No Product type* /    Admit Date/Time:  06/02/2020  3:57 PM  Primary Diagnosis:  Physical debility  Hospital Problems: Principal Problem:   Physical debility Active Problems:   Hypoalbuminemia due to protein-calorie malnutrition (HCC)   Acute blood loss anemia   Controlled type 2 diabetes mellitus with hyperglycemia, with long-term current use of insulin (HCC)   Chronic pain syndrome   AKI (acute kidney injury) [facility])    Expected Discharge Date: Expected Discharge Date: 06/10/20  Team Members Present: Physician leading conference: Dr. Leeroy Cha Care Coodinator Present: Dorien Chihuahua, RN, BSN, CRRN;Becky Dupree, LCSW Nurse Present: Dorien Chihuahua, RN PT Present: Leavy Cella, PT OT Present: Willeen Cass, OT PPS Coordinator present : Ileana Ladd, PT     Current Status/Progress Goal Weekly Team Focus  Bowel/Bladder   continent to bowel and bladder lbm 6/5  remain continent  assess qshift and prn   Swallow/Nutrition/ Hydration             ADL's   distant supervision-mod I  supervision-mod I  endurance training, energy conservation education, functional mobility training, ADL training   Mobility   indep bed mobility, indep with no AD for sit<>stands and transfers. mod I gait with RW >342ft, supervision gait with no AD. Goal level and ready for DC  mod I  DC planning, endurance training, pt education, global strengthening.   Communication             Safety/Cognition/ Behavioral Observations            Pain   no pain  remain pain free  assess every shift   Skin   skin intact.  skin wil remain intact  assess skin every shift     Discharge Planning:   Home with wife who works from home, OPPT recommended and has been arranged. Has all equipment for home   Team Discussion: Not using the CPAP in house due to lack of humidity. Right flank pain affecting progress. Patient on target to meet rehab goals: yes, currently distant supervision - Mod I assist  *See Care Plan and progress notes for long and short-term goals.   Revisions to Treatment Plan:   Teaching Needs: Transfers, toileting, medications, etc.   Current Barriers to Discharge: Decreased caregiver support and Home enviroment access/layout  Possible Resolutions to Barriers: Family education OP follow up services with [facility] Summary               I attest that I was present, lead the team conference, and concur with the assessment and plan of the team.   Dorien Chihuahua B 06/09/2020, 3:41 PM

## 2020-06-09 NOTE — Progress Notes (Signed)
Occupational Therapy Session Note  Patient Details  Name: [name] MRN: [id number] Date of Birth: 1955/10/22  Today's Date: 06/09/2020 OT Individual Time: 1415-1455 OT Individual Time Calculation (min): 40 min    Short Term Goals: Week 1:  OT Short Term Goal 1 (Week 1): STGs=LTGs  Skilled Therapeutic Interventions/Progress Updates:    Pt reports some pain in right ribcage area, he reports he has had this since initial heart attack.  Pt agreeable to completing shower during OT session with encouragement.  Pt completed all self care and functional mobility with mod I without RW and use of shower bench and grab bars.  Self care included doffing and donning shirt, pants, underwear, and socks, bathing UB and LB at sit<>stand level including using shower bench to brace RLE for thorough pericleansing.  Vitals assessed after all self care and SPO2 95-100%, pulse 91 bpm, and BP 130/79 mmHg.  Pt able to verbalize good understanding of various energy conservation techniques.  Pt sitting in recliner at end of session, call bell within reach.  Therapy Documentation Precautions:  Precautions Precautions: Fall,ICD/Pacemaker Restrictions Weight Bearing Restrictions: No Other Position/Activity Restrictions: pacemaker precautions   Therapy/Group: Individual Therapy  [name] 06/09/2020, 3:46 PM

## 2020-06-09 NOTE — Progress Notes (Signed)
Inpatient Rehabilitation Care Coordinator Discharge Note  The overall goal for the admission was met for:   Discharge location: [place] AND CAN PROVIDE SUPERVISION  Length of Stay: Yes-8 days  Discharge activity level: Yes-SUPERVISION LEVEL  Home/community participation: Yes  Services provided included: MD, RD, PT, OT, RN, CM, Pharmacy and SW  Financial Services: Medicare and Private Insurance: [place]  Choices offered to/list presented to:PT AND WIFE  Follow-up services arranged: Outpatient: [facility]-OPPT WILL CALL WIFE TO ARRANGE FOLLOW UP APPOINTMENT. HAS EQUIPMENT FROM PREVIOUS ADMITS  Comments (or additional information):PT DID WELL AND EXCEEDED GOALS, READY TO GO HOME  Patient/Family verbalized understanding of follow-up arrangements: Yes  Individual responsible for coordination of the follow-up plan: JOANN-WIFE [id number]  Confirmed correct DME delivered: Elease Hashimoto 06/09/2020    Elease Hashimoto

## 2020-06-09 NOTE — Plan of Care (Signed)
  Problem: Consults Goal: RH GENERAL PATIENT EDUCATION Description: See Patient Education module for education specifics. Outcome: Progressing   Problem: RH BOWEL ELIMINATION Goal: RH STG MANAGE BOWEL WITH ASSISTANCE Description: STG Manage Bowel with mod I Assistance. Outcome: Progressing Goal: RH STG MANAGE BOWEL W/MEDICATION W/ASSISTANCE Description: STG Manage Bowel with Medication with min Assistance. Outcome: Progressing   Problem: RH SAFETY Goal: RH STG ADHERE TO SAFETY PRECAUTIONS W/ASSISTANCE/DEVICE Description: STG Adhere to Safety Precautions With min Assistance/Device. Outcome: Progressing   Problem: RH PAIN MANAGEMENT Goal: RH STG PAIN MANAGED AT OR BELOW PT'S PAIN GOAL Description: At or below level 4 Outcome: Progressing   Problem: RH KNOWLEDGE DEFICIT GENERAL Goal: RH STG INCREASE KNOWLEDGE OF SELF CARE AFTER HOSPITALIZATION Description: Patient and wife will be able to manage care using handouts and educational resources independently Outcome: Progressing   Problem: Education: Goal: Individualized Educational Video(s) Outcome: Progressing   Problem: Cardiac: Goal: Ability to achieve and maintain adequate cardiopulmonary perfusion will improve Description: Patient and wife will be able to manage care at discharge using handouts and educational resources independently Outcome: Progressing

## 2020-06-09 NOTE — Progress Notes (Signed)
Occupational Therapy Session Note  Patient Details  Name: [name] MRN: [id number] Date of Birth: August 15, 1955  Today's Date: 06/09/2020 OT Individual Time: 0076-2263 OT Individual Time Calculation (min): 30 min    Short Term Goals: Week 1:  OT Short Term Goal 1 (Week 1): STGs=LTGs  Skilled Therapeutic Interventions/Progress Updates:    Pt in recliner to start session.  He was able to complete functional mobility to the [employer] with independence.  He was able to work on standing endurance with use of the BITS system.  He was able to complete intervals of 3-4 mins before needing to rest.  HR in the upper 80s with O2 sats at 96%.  He was able to complete visual scanning in under 1.5 seconds per interval and was able to complete 7 word sequencing for memory.  No noted issues with balance or vision during tasks.  Finished session with return to the room where he was left sitting in his recliner with the call button and phone in reach.    Therapy Documentation Precautions:  Precautions Precautions: Fall,ICD/Pacemaker Restrictions Weight Bearing Restrictions: No Other Position/Activity Restrictions: pacemaker precautions  Pain: Pain Assessment Pain Scale: 0-10 Pain Score: 4  Pain Location: Rib cage Pain Orientation: Right Pain Descriptors / Indicators: Aching Pain Intervention(s): Repositioned;Rest ADL: See Care Tool Section for some details of mobility and selfcare  Therapy/Group: Individual Therapy  Arthi Mcdonald OTR/L 06/09/2020, 5:06 PM

## 2020-06-09 NOTE — Progress Notes (Signed)
[facility] PHYSICAL MEDICINE & REHABILITATION PROGRESS NOTE  Subjective/Complaints: Discussed outpatient medications Can move d/c up tomorrow- patient doing very well, team in agreement Right sided abdominal pain improved Provided list of high fiber foods for home to help with constipation   ROS:  Pt denies SOB, abd pain, CP, N/V/C/D, and vision changes, constipation improved   Objective: Vital Signs: Blood pressure (!) 146/70, pulse 79, temperature 98.3 F (36.8 C), resp. rate 18, height 6' (1.829 m), weight 126.6 kg, SpO2 93 %. No results found. No results for input(s): WBC, HGB, HCT, PLT in the last 72 hours. Recent Labs    06/08/20 0626  NA 138  K 4.0  CL 106  CO2 25  GLUCOSE 156*  BUN 19  CREATININE 1.28*  CALCIUM 8.8*    Intake/Output Summary (Last 24 hours) at 06/09/2020 1041 Last data filed at 06/09/2020 1034 Gross per 24 hour  Intake 1380 ml  Output 2320 ml  Net -940 ml        Physical Exam: BP (!) 146/70 (BP Location: Right Arm)   Pulse 79   Temp 98.3 F (36.8 C)   Resp 18   Ht 6' (1.829 m)   Wt 126.6 kg   SpO2 93%   BMI 37.85 kg/m   Gen: no distress, normal appearing HEENT: oral mucosa pink and moist, NCAT Cardio: Reg rate Chest: normal effort, normal rate of breathing Abd: soft, non-distended Ext: no edema Psych: pleasant, normal affect Neurological: alert   Improved affect. Musc: TTP over edge of R lower ribs- but pt reports is "somewhat better".  Neuro: Alert Sensation diminished to light touch distal to mid calf bilaterally Motor: Bilateral upper extremities: 5/5 proximal distal Bilateral lower extremity: Flexion, knee extension 4 +/5, ankle dorsiflexion 4+/5, stable   Assessment/Plan: 1. Functional deficits which require 3+ hours per day of interdisciplinary therapy in a comprehensive inpatient rehab setting.  Physiatrist is providing close team supervision and 24 hour management of active medical problems listed  below.  Physiatrist and rehab team continue to assess barriers to discharge/monitor patient progress toward functional and medical goals   Care Tool:  Bathing    Body parts bathed by patient: Right arm,Left arm,Chest,Front perineal area,Abdomen,Buttocks,Right upper leg,Left upper leg,Face,Right lower leg,Left lower leg         Bathing assist Assist Level: Set up assist     Upper Body Dressing/Undressing Upper body dressing   What is the patient wearing?: Pull over shirt    Upper body assist Assist Level: Independent    Lower Body Dressing/Undressing Lower body dressing      What is the patient wearing?: Pants,Underwear/pull up     Lower body assist Assist for lower body dressing: Independent with assitive device     Toileting Toileting    Toileting assist Assist for toileting: Minimal Assistance - Patient > 75%     Transfers Chair/bed transfer  Transfers assist     Chair/bed transfer assist level: Supervision/Verbal cueing     Locomotion Ambulation   Ambulation assist      Assist level: Supervision/Verbal cueing Assistive device: No Device Max distance: 150   Walk 10 feet activity   Assist     Assist level: Supervision/Verbal cueing Assistive device: No Device   Walk 50 feet activity   Assist    Assist level: Supervision/Verbal cueing Assistive device: No Device    Walk 150 feet activity   Assist Walk 150 feet activity did not occur: Safety/medical concerns  Assist level: Supervision/Verbal cueing  Assistive device: No Device    Walk 10 feet on uneven surface  activity   Assist     Assist level: Contact Guard/Touching assist Assistive device: Aeronautical engineer Will patient use wheelchair at discharge?: No             Wheelchair 50 feet with 2 turns activity    Assist            Wheelchair 150 feet activity     Assist           Medical Problem List and  Plan: 1.Functional and mobility deficitssecondary to VF cardiac arrest and subsequent debility  D/c tomorrow 2. Impaired mobility.  -DVT/anticoagulation:Pharmaceutical:d/c Lovenox since ambulating 600 feet -antiplatelet therapy: DAPT 3.Chronic pain/Pain Management:Lidocaine/volatren gel to LBP -continue Neurontin bid and flexeril tid.  -voltaren gel to back and neck  6/7: encourage use of 5mg  oxycodone rather than 10mg  oxycodone  Monitor with increased exertion 4. Mood:LCSW to follow for evaluation and support. -antipsychotic agents: N/A 5. Neuropsych: This patientiscapable of making decisions on hisown behalf. 6. Skin/Wound Care:Routine pressure relief measures. -monitor incision sites, wounds 7. Fluids/Electrolytes/Nutrition:Monitor I/Os 8. 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 NO driving for 6 months.  --continue Coreg, ASA, Plavix and Lipitor.  --No heavy lifting or vigorous activity withtheleft arm for 6 to 8 weeks. Do not raise your left arm above your head for one week.  9. T2DM with hyperglycemia: Monitor BS ac/hs. Continue Levemir 38 units bid w/ novolog 6 units tid and slowly titrate up to home dose as required. --PTA was on Levemir 80 units HS with humalog 40/40/50 for meal coverage.  Discussed with pharmacy, home medication restarted  Labile on 6/3, monitor for trend,?  Stabilizing  6/6- BG [id number] has been better in last 18 hours, was high prior- wll increase Levemir to 40 mg BID and con't meal coverage- con't to monitor  6/7: better controlled, continue above change  Monitor with increased activity 10. Abnormal LFTs: Resolved 11. Leucocytosis: Resolved  12. Acute kidney injury  Creatinine 1.22 on 6/3   Encourage fluids  Continue to monitor 13.  Acute blood loss anemia  Hemoglobin 12.4 on 6/3   Continue to monitor 14.  Hypoalbuminemia  Supplement initiated  on 6/1 15. OSA: CPAP ordered but patient did not tolerate given no humidifier. Continue [place] at night  16. Obesity BMI 37.85: educated that use of CPAP at night can also help with weight loss  6/6- not using since doesn't have humidifier- using O2 only at night- con't to encourage CPAp usage 17. Fatigue: discussed option of modafinil for fatigue if does not improve with CPAP use.  18. Constipation: discussed various options, patient prefers to request wife bring in prunes from home.  6/6- pt on colace- refuses laxative- con't his regimen per his request.  19. Right flank pain: possibly 2/2 constipation. Patient concerned about liver given prior injury- reviewed LFTs with him, normal on last check. Change BMP tomorrow to CMP to continue to monitor.   6/6- LFTs good- flank pain could be from lower rib pain from CPR- con't regimen LOS: 7 days A FACE TO FACE EVALUATION WAS PERFORMED  [name] 06/09/2020, 10:41 AM

## 2020-06-09 NOTE — Progress Notes (Signed)
Patient refused CPAP for the night. Patient wearing oxygen set at 2lpm with Sp02-97%

## 2020-06-10 LAB — GLUCOSE, CAPILLARY: Glucose-Capillary: 115 mg/dL — ABNORMAL HIGH (ref 70–99)

## 2020-06-10 MED ORDER — CLOPIDOGREL BISULFATE 75 MG PO TABS: 75.0000 mg | ORAL_TABLET | Freq: Every day | ORAL | 0 refills | Status: DC

## 2020-06-10 MED ORDER — EMPAGLIFLOZIN 10 MG PO TABS: 10.0000 mg | ORAL_TABLET | Freq: Every day | ORAL | 0 refills | Status: DC

## 2020-06-10 MED ORDER — DOCUSATE SODIUM 100 MG PO CAPS: 100.0000 mg | ORAL_CAPSULE | Freq: Two times a day (BID) | ORAL | 0 refills | Status: DC

## 2020-06-10 MED ORDER — CARVEDILOL 3.125 MG PO TABS: 3.1250 mg | ORAL_TABLET | Freq: Two times a day (BID) | ORAL | 0 refills | Status: DC

## 2020-06-10 MED ORDER — OXYCODONE HCL 5 MG PO TABS: 5.0000 mg | ORAL_TABLET | Freq: Two times a day (BID) | ORAL | 0 refills | Status: DC | PRN

## 2020-06-10 MED ORDER — NITROGLYCERIN 0.4 MG SL SUBL: 0.4000 mg | SUBLINGUAL_TABLET | SUBLINGUAL | 12 refills | Status: DC | PRN

## 2020-06-10 MED ORDER — DICLOFENAC SODIUM 1 % TD GEL: 4.0000 g | Freq: Three times a day (TID) | TRANSDERMAL | Status: DC | PRN

## 2020-06-10 MED ORDER — DICLOFENAC SODIUM 1 % TD GEL: 4.0000 g | Freq: Three times a day (TID) | TRANSDERMAL | 0 refills | Status: AC | PRN

## 2020-06-10 MED ORDER — DOCUSATE SODIUM 100 MG PO CAPS: 100.0000 mg | ORAL_CAPSULE | Freq: Two times a day (BID) | ORAL | 0 refills | Status: AC

## 2020-06-10 MED ORDER — CYCLOBENZAPRINE HCL 5 MG PO TABS: 5.0000 mg | ORAL_TABLET | Freq: Three times a day (TID) | ORAL | 0 refills | Status: DC

## 2020-06-10 MED ORDER — DICLOFENAC SODIUM 1 % TD GEL: 4.0000 g | Freq: Three times a day (TID) | TRANSDERMAL | 0 refills | Status: DC | PRN

## 2020-06-10 MED ORDER — GABAPENTIN 100 MG PO CAPS: 200.0000 mg | ORAL_CAPSULE | Freq: Two times a day (BID) | ORAL | 0 refills | Status: DC

## 2020-06-10 MED ORDER — NITROGLYCERIN 0.4 MG SL SUBL: 0.4000 mg | SUBLINGUAL_TABLET | SUBLINGUAL | 12 refills | Status: AC | PRN

## 2020-06-10 MED ORDER — INSULIN DETEMIR 100 UNIT/ML ~~LOC~~ SOLN: 40.0000 [IU] | Freq: Two times a day (BID) | SUBCUTANEOUS | 11 refills | Status: DC

## 2020-06-10 MED ORDER — POTASSIUM CHLORIDE CRYS ER 20 MEQ PO TBCR: 20.0000 meq | EXTENDED_RELEASE_TABLET | Freq: Every day | ORAL | 0 refills | Status: DC

## 2020-06-10 NOTE — Progress Notes (Signed)
Patient ID: [name], male   DOB: Aug 10, 1955, 65 y.o.   MRN: [id number] Follow up with the patient regarding pending discharge and readiness for discharge. Patient noted little chest soreness however he feels like he is getting around well; ready for discharge. Pam PAC notified of readiness for d/c instructions. Patient has packed up all educational materials and has no questions or concerns at present. Margarito Liner, RN

## 2020-06-10 NOTE — Progress Notes (Signed)
[facility] PHYSICAL MEDICINE & REHABILITATION PROGRESS NOTE  Subjective/Complaints:    ROS:  Pt denies SOB, abd pain, CP, N/V/C/D, and vision changes, constipation improved   Objective: Vital Signs: Blood pressure 131/74, pulse 78, temperature 97.9 F (36.6 C), temperature source Oral, resp. rate 16, height 6' (1.829 m), weight 126.6 kg, SpO2 97 %. No results found. No results for input(s): WBC, HGB, HCT, PLT in the last 72 hours. Recent Labs    06/08/20 0626  NA 138  K 4.0  CL 106  CO2 25  GLUCOSE 156*  BUN 19  CREATININE 1.28*  CALCIUM 8.8*    Intake/Output Summary (Last 24 hours) at 06/10/2020 0746 Last data filed at 06/10/2020 0010 Gross per 24 hour  Intake 360 ml  Output 1450 ml  Net -1090 ml        Physical Exam: BP 131/74 (BP Location: Left Arm)   Pulse 78   Temp 97.9 F (36.6 C) (Oral)   Resp 16   Ht 6' (1.829 m)   Wt 126.6 kg   SpO2 97%   BMI 37.85 kg/m    General: No acute distress Mood and affect are appropriate Heart: Regular rate and rhythm no rubs murmurs or extra sounds Lungs: Clear to auscultation, breathing unlabored, no rales or wheezes Abdomen: Positive bowel sounds, soft nontender to palpation, nondistended Extremities: No clubbing, cyanosis, or edema Skin: No evidence of breakdown, no evidence of rash   Neuro: Alert Sensation diminished to light touch distal to mid calf bilaterally Motor: Bilateral upper extremities: 5/5 proximal distal Bilateral lower extremity: Flexion, knee extension 4 +/5, ankle dorsiflexion 4+/5, stable   Assessment/Plan: 1. Functional deficits due to debility Stable for D/C today F/u Cardiology in 3-4 weeks F/u PCP 2 weeks See D/C summary See D/C instructions Care Tool:  Bathing    Body parts bathed by patient: Right arm,Left arm,Chest,Front perineal area,Abdomen,Buttocks,Right upper leg,Left upper leg,Face,Right lower leg,Left lower leg         Bathing assist Assist Level: Set up assist      Upper Body Dressing/Undressing Upper body dressing   What is the patient wearing?: Pull over shirt    Upper body assist Assist Level: Independent    Lower Body Dressing/Undressing Lower body dressing      What is the patient wearing?: Pants,Underwear/pull up     Lower body assist Assist for lower body dressing: Independent with assitive device     Toileting Toileting    Toileting assist Assist for toileting: Minimal Assistance - Patient > 75% (min A for posterior hygiene, pt has bidet at home)     Transfers Chair/bed transfer  Transfers assist     Chair/bed transfer assist level: Independent     Locomotion Ambulation   Ambulation assist      Assist level: Independent Assistive device: No Device Max distance: 150   Walk 10 feet activity   Assist     Assist level: Independent with assistive device Assistive device: Walker-rolling   Walk 50 feet activity   Assist    Assist level: Independent with assistive device Assistive device: Walker-rolling    Walk 150 feet activity   Assist Walk 150 feet activity did not occur: Safety/medical concerns  Assist level: Independent with assistive device Assistive device: Walker-rolling    Walk 10 feet on uneven surface  activity   Assist     Assist level: Supervision/Verbal cueing Assistive device: Walker-rolling   Wheelchair     Assist Will patient use wheelchair at discharge?: No  Wheelchair 50 feet with 2 turns activity    Assist            Wheelchair 150 feet activity     Assist           Medical Problem List and Plan: 1.Functional and mobility deficitssecondary to VF cardiac arrest and subsequent debility  D/c today 2. Impaired mobility.  -DVT/anticoagulation:Pharmaceutical:d/c Lovenox since ambulating 600 feet -antiplatelet therapy: DAPT 3.Chronic pain/Pain Management:Lidocaine/volatren gel to LBP -continue  Neurontin bid and flexeril tid.  -voltaren gel to back and neck  6/7: encourage use of 5mg  oxycodone rather than 10mg  oxycodone  Monitor with increased exertion 4. Mood:LCSW to follow for evaluation and support. -antipsychotic agents: N/A 5. Neuropsych: This patientiscapable of making decisions on hisown behalf. 6. Skin/Wound Care:Routine pressure relief measures. -monitor incision sites, wounds 7. Fluids/Electrolytes/Nutrition:Monitor I/Os 8. 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 NO driving for 6 months.  --continue Coreg, ASA, Plavix and Lipitor.  --No heavy lifting or vigorous activity withtheleft arm for 6 to 8 weeks. Do not raise your left arm above your head for one week.  9. T2DM with hyperglycemia: Monitor BS ac/hs. Continue Levemir 38 units bid w/ novolog 6 units tid and slowly titrate up to home dose as required. --PTA was on Levemir 80 units HS with humalog 40/40/50 for meal coverage.  Discussed with pharmacy, home medication restarted  Labile on 6/3, monitor for trend,?  Stabilizing  6/6- BG [id number] has been better in last 18 hours, was high prior- wll increase Levemir to 40 mg BID and con't meal coverage- con't to monitor   CBG (last 3)  Recent Labs    06/09/20 1632 06/09/20 1954 06/10/20 0619  GLUCAP 217* 290* 115*  Good am CBG but elevated pm readings-f/u PCP   Monitor with increased activity 10. Abnormal LFTs: Resolved 11. Leucocytosis: Resolved  12. Acute kidney injury  Creatinine 1.22 on 6/3   Encourage fluids  Continue to monitor 13.  Acute blood loss anemia  Hemoglobin 12.4 on 6/3   Continue to monitor 14.  Hypoalbuminemia  Supplement initiated on 6/1 15. OSA: CPAP ordered but patient did not tolerate given no humidifier. Continue [place] at night  16. Obesity BMI 37.85: educated that use of CPAP at night can also help with weight loss  6/6- not using since doesn't have humidifier-  using O2 only at night- con't to encourage CPAp usage     LOS: 8 days A FACE TO FACE EVALUATION WAS PERFORMED  Charlett Blake 06/10/2020, 7:46 AM

## 2020-06-10 NOTE — Discharge Instructions (Signed)
Inpatient Rehab Discharge Instructions  [name] Discharge date and time:  06/10/20  Activities/Precautions/ Functional Status: Activity: no lifting, driving, or strenuous exercise till cleared by MD Diet: cardiac diet and diabetic diet Wound Care: keep wound clean and dry    Functional status:  ___ No restrictions     ___ Walk up steps independently _X__ 24/7 supervision/assistance   ___ Walk up steps with assistance ___ Intermittent supervision/assistance  ___ Bathe/dress independently ___ Walk with walker     ___ Bathe/dress with assistance ___ Walk Independently    ___ Shower independently ___ Walk with assistance    _X__ Shower with assistance _X__ No alcohol     ___ Return to work/school ________   Special Instructions: 1. Monitor blood sugars before meals and at bedtime.  2. NO driving for 6 months  3. Need wear  CPAP whenever napping or sleeping. 4. No strenuous activity or lifting items over 5 lbs with left arm.    COMMUNITY REFERRALS UPON DISCHARGE:    Outpatient: PT             Agency:[facility] [place] [id number]   Phone:[phone]              Appointment Date/Time:WILL CALL WIFE TO SET UP APPOINTMENTS  Medical Equipment/Items Ordered:NO NEEDS HAS FROM PREVIOUS ADMITS                                                 Agency/Supplier:NA   My questions have been answered and I understand these instructions. I will adhere to these goals and the provided educational materials after my discharge from the hospital.  Patient/Caregiver Signature _______________________________ Date __________  Clinician Signature _______________________________________ Date __________  Please bring this form and your medication list with you to all your follow-up doctor's appointments.

## 2020-06-11 ENCOUNTER — Ambulatory Visit: Payer: Non-veteran care

## 2020-06-16 ENCOUNTER — Ambulatory Visit (INDEPENDENT_AMBULATORY_CARE_PROVIDER_SITE_OTHER): Payer: Medicare Other | Admitting: Emergency Medicine

## 2020-06-16 ENCOUNTER — Telehealth: Payer: Self-pay | Admitting: Cardiology

## 2020-06-16 ENCOUNTER — Other Ambulatory Visit: Payer: Self-pay

## 2020-06-16 DIAGNOSIS — I469 Cardiac arrest, cause unspecified: Secondary | ICD-10-CM | POA: Diagnosis not present

## 2020-06-16 LAB — CUP PACEART INCLINIC DEVICE CHECK
Battery Remaining Longevity: 132 mo
Battery Voltage: 3.13 V
Brady Statistic AP VP Percent: 0.05 %
Brady Statistic AP VS Percent: 0.75 %
Brady Statistic AS VP Percent: 0.13 %
Brady Statistic AS VS Percent: 99.07 %
Brady Statistic RA Percent Paced: 0.8 %
Brady Statistic RV Percent Paced: 0.18 %
Date Time Interrogation Session: 20220614141400
HighPow Impedance: 68 Ohm
Implantable Lead Implant Date: 20220526
Implantable Lead Implant Date: 20220526
Implantable Lead Location: 753859
Implantable Lead Location: 753860
Implantable Lead Model: 5076
Implantable Pulse Generator Implant Date: 20220526
Lead Channel Impedance Value: 399 Ohm
Lead Channel Impedance Value: 456 Ohm
Lead Channel Impedance Value: 513 Ohm
Lead Channel Pacing Threshold Amplitude: 0.5 V
Lead Channel Pacing Threshold Amplitude: 0.75 V
Lead Channel Pacing Threshold Pulse Width: 0.4 ms
Lead Channel Pacing Threshold Pulse Width: 0.4 ms
Lead Channel Sensing Intrinsic Amplitude: 4 mV
Lead Channel Sensing Intrinsic Amplitude: 5.125 mV
Lead Channel Setting Pacing Amplitude: 3.5 V
Lead Channel Setting Pacing Amplitude: 3.5 V
Lead Channel Setting Pacing Pulse Width: 0.4 ms
Lead Channel Setting Sensing Sensitivity: 0.3 mV

## 2020-06-16 NOTE — Progress Notes (Signed)
Wound check appointment. Steri-strips removed. Wound without redness or edema. Incision edges approximated, wound well healed. Normal device function. Thresholds, sensing, and impedances consistent with implant measurements. Device programmed at 3.5V for extra safety margin until 3 month visit. Histogram distribution appropriate for patient and level of activity. No mode switches or ventricular arrhythmias noted. Patient educated about wound care, arm mobility, lifting restrictions, shock plan. ROV with Dr Quentin Ore on 09/01/20. Enrolled in remote follow-up and next remote 09/04/20.

## 2020-06-16 NOTE — Telephone Encounter (Signed)
Patient came into the office to pay $29 fee, sign authorization, and drop off an FMLA form. HIM received the form. The form has been placed in Dr. Mardene Speak box to be completed. AO 06/16/20

## 2020-06-19 ENCOUNTER — Telehealth: Payer: Self-pay | Admitting: Interventional Cardiology

## 2020-06-19 NOTE — Telephone Encounter (Signed)
Pt c/o swelling: STAT is pt has developed SOB within 24 hours  How much weight have you gained and in what time span? 4lbs over night  If swelling, where is the swelling located? no  Are you currently taking a fluid pill?  no Are you currently SOB?  no  Do you have a log of your daily weights (if so, list)? overnight  Have you gained 3 pounds in a day or 5 pounds in a week? 4lbs overnight 272.8-276.8  Have you traveled recently? no  Patient states that he is feeling fine but unsure why he gain weight because he is eating better

## 2020-06-19 NOTE — Telephone Encounter (Signed)
Spoke with pt and he states weight went from 272 to 276 overnight.  Denies swelling, SOB, CP or other issues.  Did state that he had two sandwiches with lunch meat and a bowl of tomato soup yesterday.  Educated patient on salt intake and that lunch meat and soups generally are higher in sodium.  Pt has increased exercise since discharge and feels better.  Seen at [facility] this week.  Pt not currently on any diuretics.  Does take Jardiance.  Advised pt to be more mindful of sodium intake and increase water intake to help flush out some of the sodium.  Advised to call back Monday if weight continues to increase or call back over the weekend if weight jumps up 3+ lbs overnight tonight.  Pt verbalized understanding and was in agreement with plan.

## 2020-06-22 NOTE — Telephone Encounter (Signed)
Spoke with pt and advised to Dr Thompson Caul instructions to continue to weigh daily and to notify the office if any increase in edema or SOB.  Pt states understanding.

## 2020-06-24 NOTE — Progress Notes (Signed)
Advanced Heart Failure Clinic Note   PCP: [facility] Primary Cardiologist: Dr. Tamala Julian EP: Dr. Quentin Ore HF Cardiologist: Dr. Aundra Dubin  HPI: 65 y/o male w/ HTN, IDT2DM, HLD, VF arrest, and new chronic heart failure with mid-range EF.    He suffered witnessed out of hospital cardiac arrest 05/19/20 w/ immediate bystander CPR, initiated by wife, and prompt activation of EMS. Per report, had prolonged CPR, more than 45 min prior to ROSC. Underlying rhythm was VFib. Bedside echo showed EF 40-50 % inferobasal severe hypokinesis with septal bounce.  RV systolic function normal.    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 No other disease. LM, LAD and LCx all widely patent. No indication for PCI.   He required mechanical ventilation and pressor support, but did not require cardiac mechanical support. 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 He received an ICD for secondary prevention. He was discharged to CIR.   Today he returns for HF follow up. Overall feeling fine, some fatigue. Some SOB with exertion. Limited mainly by knee pain. Denies increasing SOB, CP, dizziness, edema, or PND/Orthopnea. Appetite ok. No fever or chills. Weight at home 275 pounds. Taking all medications. Wears CPAP. Doing PT exercises at home now.  ECG: SR with LBBB 79 bpm (personally reviewed).  ICD interrogation: OptiVol stable, patient activity ~2 hrs/day, no VT/VF (personally reviewed).  ROS: All systems negative except as listed in HPI, PMH and Problem List.  SH:  Social History   Socioeconomic History   Marital status: Married    Spouse name: Not on file   Number of children: Not on file   Years of education: Not on file   Highest education level: Not on file   Occupational History   Not on file  Tobacco Use   Smoking status: Former    Packs/day: 1.50    Years: 42.00    Pack years: 63.00    Types: Cigarettes    Quit date: 09/03/2013    Years since quitting: 6.8   Smokeless tobacco: Former    Types: Chew   Tobacco comments:    We discussed the need to remain smoke free and to call me for help if he has the desire to smoke again.  Vaping Use   Vaping Use: Never used  Substance and Sexual Activity   Alcohol use: No    Alcohol/week: 0.0 standard drinks    Comment: recovering alcoholic    Drug use: No   Sexual activity: Not on file  Other Topics Concern   Not on file  Social History Narrative   Recovering alcoholic      Minister    Social Determinants of Health   Financial Resource Strain: Low Risk    Difficulty of Paying Living Expenses: Not very hard  Food Insecurity: No Food Insecurity   Worried About Charity fundraiser in the Last Year: Never true   Ran Out of Food in the Last Year: Never true  Transportation Needs: No Transportation Needs   Lack of Transportation (Medical): No   Lack of Transportation (Non-Medical): No  Physical Activity: Not on file  Stress: Not on file  Social Connections: Not on file  Intimate Partner Violence: Not on file   FH:  Family History  Problem Relation Age of Onset   Cancer Mother  lung   Crohn's disease Father    Alcohol abuse Brother    Colon polyps Brother    Colon cancer Neg Hx    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 Swallow whole.     atorvastatin (LIPITOR) 80 MG tablet Take 1 tablet (80 mg total) by mouth daily. 90 tablet 0   carboxymethylcellulose (REFRESH PLUS) 0.5 % SOLN Place 1 drop into both eyes 3 (three) times daily as needed (dry eyes).     carvedilol (COREG) 3.125 MG tablet Take 1 tablet (3.125 mg total) by mouth 2 (two) times daily with a meal. 28 tablet 0   clopidogrel (PLAVIX) 75 MG tablet Take 1 tablet (75 mg total) by mouth daily. 14 tablet 0   cyclobenzaprine (FLEXERIL) 5 MG tablet Take 1 tablet (5 mg total) by mouth 3 (three) times daily. 42 tablet 0   diclofenac sodium (VOLTAREN) 1 % GEL Apply 4 g topically 3 (three) times daily as needed (pain). 350 g 0   docusate sodium (COLACE) 100 MG capsule Take 1 capsule (100 mg total) by mouth 2 (two) times daily. 28 capsule 0   empagliflozin (JARDIANCE) 10 MG TABS tablet Take 1 tablet (10 mg total) by mouth daily. 14 tablet 0   gabapentin (NEURONTIN) 100 MG capsule Take 200 mg by mouth 2 (two) times daily.     insulin detemir (LEVEMIR) 100 UNIT/ML injection Inject 0.4 mLs (40 Units total) into the skin 2 (two) times daily. 10 mL 11   ketotifen (ZADITOR) 0.025 % ophthalmic solution Place 1 drop into both eyes 3 (three) times daily as needed (itchy eyes). 5 mL 0   lidocaine (LIDODERM) 5 % Place 1 patch onto the skin daily as needed (pain).     loratadine (CLARITIN) 10 MG tablet Take 10 mg by mouth daily.     Multiple Vitamins-Minerals (MULTIVITAMIN WITH MINERALS) tablet Take 1 tablet by mouth daily.     nitroGLYCERIN (NITROSTAT) 0.4 MG SL tablet Place 1 tablet (0.4 mg total) under the tongue every 5 (five) minutes x 3 doses as needed for chest pain. Call MD if you need two or more doses 30 tablet 12   oxyCODONE (OXY IR/ROXICODONE) 5 MG immediate release tablet Take 1-2 tablets (5-10 mg total) by mouth every 12 (twelve) hours as needed for moderate pain. 28  tablet 0   pantoprazole (PROTONIX) 40 MG tablet Take 40 mg by mouth 2 (two) times daily before a meal.     potassium chloride SA (KLOR-CON) 20 MEQ tablet Take 1 tablet (20 mEq total) by mouth daily. 30 tablet 0   No current facility-administered medications for this encounter.   Vitals:   06/26/20 0956  BP: 138/70  Pulse: 84  SpO2: 97%  Weight: 127.1 kg (280 lb 3.2 oz)   Wt  Readings from Last 3 Encounters:  06/26/20 127.1 kg (280 lb 3.2 oz)  06/05/20 126.6 kg (279 lb 1.6 oz)  06/02/20 127.4 kg (280 lb 13.9 oz)   PHYSICAL EXAM: General:  NAD. No resp difficulty HEENT: Normal Neck: Supple. Thick neck, JVD difficult. Carotids 2+ bilat; no bruits. No lymphadenopathy or thryomegaly appreciated. Cor: PMI nondisplaced. Regular rate & rhythm. No rubs, gallops or murmurs. Lungs: Clear Abdomen: Obese, soft, nontender, nondistended. No hepatosplenomegaly. No bruits or masses. Good bowel sounds. Extremities: No cyanosis, clubbing, rash, edema Neuro: Alert & oriented x 3, cranial nerves grossly intact. Moves all 4 extremities w/o difficulty. Affect pleasant.  ASSESSMENT & PLAN:  1. Chronic HF with mid-rage EF: Ischemic cardiomyopathy, EF in the 45% range on initial echo but now back up to 60-65% on repeat limited echo 5/26.  Suspect stunning with arrest and now improved.  Patient was markedly volume overloaded, now diuresed well. He does not need daily lasix. - NYHA II. Volume status stable.  - Continue Jardiance 10 mg daily. BMET today. - Continue carvedilol 3.125 mg bid.   2. CAD: Patient appears to have CTO of the mid RCA with good collaterals.  I do not think this was acute MI from plaque rupture.  HS-TnI significantly elevated, but think this was generalized ischemia from hypotension with prolonged cardiac arrest. - Continue ASA + Plavix.  - Continue statin. 3. Cardiac arrest: VF arrest with immediate CPR but prolonged time to ROSC (about 45 minutes). Suspect scar-mediated from old RCA  occlusion. EF now normal.   - ICD placed 5/26 for secondary prevention.    - No driving for 6 months (~12/22). 4. Diabetes: On Jardiance and Levemir. 5. 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  No further HB. - ICD placed 5/26. 6. HTN: elevated today. Consider increasing beta blocker, or add low-dose ACE/ARB for renal protection. - He will check his BP at home and bring log to next appt. 7. Physical Deconditioning: continues with PT exercises at home.  Follow up in 3 months with Dr. Newman Nip, FNP-BC 06/26/20

## 2020-06-25 ENCOUNTER — Telehealth (HOSPITAL_COMMUNITY): Payer: Self-pay | Admitting: *Deleted

## 2020-06-25 NOTE — Telephone Encounter (Signed)
Received [facility] authorization for this pt to participate in Cardiac rehab.  Pt noted to live in [place].  Contacted pt to determine his preference in location.  Mr [name] would like to go to [facility] which is closer for him.  Pt is interested in participating however he had ICD placed on 5/26 and has been instructed not to drive for 6 months.  Pt indicated that his wife would not be able to drive him back and forth.  Called the [facility] to inform the community care coordinator assigned to him know pt preference in location and the inability to get CR due to transportation barrier. Cherre Huger, BSN Cardiac and Training and development officer

## 2020-06-26 ENCOUNTER — Ambulatory Visit (HOSPITAL_COMMUNITY)
Admit: 2020-06-26 | Discharge: 2020-06-26 | Disposition: A | Discharge status: Home / Self Care | Payer: No Typology Code available for payment source | Attending: Family Medicine | Admitting: Family Medicine

## 2020-06-26 ENCOUNTER — Other Ambulatory Visit: Payer: Self-pay

## 2020-06-26 ENCOUNTER — Encounter (HOSPITAL_COMMUNITY): Payer: Self-pay

## 2020-06-26 VITALS — BP 138/70 | HR 84 | Wt 280.2 lb

## 2020-06-26 DIAGNOSIS — Z7902 Long term (current) use of antithrombotics/antiplatelets: Secondary | ICD-10-CM | POA: Diagnosis not present

## 2020-06-26 DIAGNOSIS — G8929 Other chronic pain: Secondary | ICD-10-CM | POA: Diagnosis not present

## 2020-06-26 DIAGNOSIS — E119 Type 2 diabetes mellitus without complications: Secondary | ICD-10-CM | POA: Insufficient documentation

## 2020-06-26 DIAGNOSIS — E1159 Type 2 diabetes mellitus with other circulatory complications: Secondary | ICD-10-CM

## 2020-06-26 DIAGNOSIS — Z6838 Body mass index (BMI) 38.0-38.9, adult: Secondary | ICD-10-CM | POA: Diagnosis not present

## 2020-06-26 DIAGNOSIS — I255 Ischemic cardiomyopathy: Secondary | ICD-10-CM | POA: Insufficient documentation

## 2020-06-26 DIAGNOSIS — I453 Trifascicular block: Secondary | ICD-10-CM | POA: Insufficient documentation

## 2020-06-26 DIAGNOSIS — Z8674 Personal history of sudden cardiac arrest: Secondary | ICD-10-CM

## 2020-06-26 DIAGNOSIS — I2510 Atherosclerotic heart disease of native coronary artery without angina pectoris: Secondary | ICD-10-CM

## 2020-06-26 DIAGNOSIS — I10 Essential (primary) hypertension: Secondary | ICD-10-CM

## 2020-06-26 DIAGNOSIS — Z8679 Personal history of other diseases of the circulatory system: Secondary | ICD-10-CM | POA: Diagnosis not present

## 2020-06-26 DIAGNOSIS — I5032 Chronic diastolic (congestive) heart failure: Secondary | ICD-10-CM

## 2020-06-26 DIAGNOSIS — I440 Atrioventricular block, first degree: Secondary | ICD-10-CM | POA: Diagnosis not present

## 2020-06-26 DIAGNOSIS — R5381 Other malaise: Secondary | ICD-10-CM | POA: Diagnosis not present

## 2020-06-26 DIAGNOSIS — E669 Obesity, unspecified: Secondary | ICD-10-CM | POA: Insufficient documentation

## 2020-06-26 DIAGNOSIS — I5023 Acute on chronic systolic (congestive) heart failure: Secondary | ICD-10-CM | POA: Diagnosis not present

## 2020-06-26 DIAGNOSIS — I509 Heart failure, unspecified: Secondary | ICD-10-CM | POA: Diagnosis not present

## 2020-06-26 DIAGNOSIS — I110 Hypertensive heart disease with heart failure: Secondary | ICD-10-CM | POA: Insufficient documentation

## 2020-06-26 DIAGNOSIS — Z7982 Long term (current) use of aspirin: Secondary | ICD-10-CM | POA: Insufficient documentation

## 2020-06-26 DIAGNOSIS — E785 Hyperlipidemia, unspecified: Secondary | ICD-10-CM | POA: Insufficient documentation

## 2020-06-26 DIAGNOSIS — Z95810 Presence of automatic (implantable) cardiac defibrillator: Secondary | ICD-10-CM | POA: Diagnosis not present

## 2020-06-26 DIAGNOSIS — Z79899 Other long term (current) drug therapy: Secondary | ICD-10-CM | POA: Insufficient documentation

## 2020-06-26 DIAGNOSIS — Z794 Long term (current) use of insulin: Secondary | ICD-10-CM | POA: Diagnosis not present

## 2020-06-26 DIAGNOSIS — Z87891 Personal history of nicotine dependence: Secondary | ICD-10-CM | POA: Insufficient documentation

## 2020-06-26 DIAGNOSIS — Z7984 Long term (current) use of oral hypoglycemic drugs: Secondary | ICD-10-CM | POA: Diagnosis not present

## 2020-06-26 LAB — BASIC METABOLIC PANEL
Anion gap: 8 (ref 5–15)
BUN: 23 mg/dL (ref 8–23)
CO2: 23 mmol/L (ref 22–32)
Calcium: 8.8 mg/dL — ABNORMAL LOW (ref 8.9–10.3)
Chloride: 106 mmol/L (ref 98–111)
Creatinine, Ser: 1.03 mg/dL (ref 0.61–1.24)
GFR, Estimated: >60 mL/min (ref >60)
Glucose, Bld: 102 mg/dL — ABNORMAL HIGH (ref 70–99)
Potassium: 4.1 mmol/L (ref 3.5–5.1)
Sodium: 137 mmol/L (ref 135–145)

## 2020-06-26 NOTE — Patient Instructions (Signed)
Great to see you! Labs today -we will only call you with abnormal values.  Check BP daily --if Systolic blood pressure above 130 notify clinic.  See follow appt scheduled

## 2020-06-29 ENCOUNTER — Telehealth (HOSPITAL_COMMUNITY): Payer: Self-pay

## 2020-06-29 NOTE — Telephone Encounter (Signed)
Patient advised and verbalized understanding 

## 2020-06-29 NOTE — Telephone Encounter (Signed)
Patient called to report that his Blood pressure this morning was 148/84. He reports that at his visit on 6/24 he was told to notify if if his blood pressure was above 130/80. Please advise.

## 2020-06-29 NOTE — Telephone Encounter (Signed)
30 minutes after he took his medication. Now its 135/84

## 2020-07-01 NOTE — Telephone Encounter (Addendum)
This form has been transferred/sent via fax on 06/24/20 to Dr. Aundra Dubin for completion as requested by Nurse Tamala Julian. AO 07/01/20

## 2020-07-02 ENCOUNTER — Telehealth (HOSPITAL_COMMUNITY): Payer: Self-pay | Admitting: Surgery

## 2020-07-02 NOTE — Telephone Encounter (Signed)
Received FMLA Paperwork and attempted to reach patent's wife for clarification.  I left a message for a return call.

## 2020-07-09 ENCOUNTER — Other Ambulatory Visit: Payer: Self-pay

## 2020-07-14 ENCOUNTER — Telehealth (HOSPITAL_COMMUNITY): Payer: Self-pay | Admitting: *Deleted

## 2020-07-14 NOTE — Telephone Encounter (Signed)
FMLA for [name] faxed to [phone]

## 2020-07-15 ENCOUNTER — Ambulatory Visit: Payer: Medicare Other | Admitting: Nutrition

## 2020-07-16 ENCOUNTER — Other Ambulatory Visit: Payer: Self-pay

## 2020-07-16 ENCOUNTER — Encounter: Payer: Medicare Other | Admitting: Physical Medicine and Rehabilitation

## 2020-07-17 ENCOUNTER — Encounter (HOSPITAL_COMMUNITY): Payer: Non-veteran care

## 2020-07-24 ENCOUNTER — Other Ambulatory Visit: Payer: Self-pay

## 2020-07-24 ENCOUNTER — Ambulatory Visit (HOSPITAL_COMMUNITY)
Admission: RE | Admit: 2020-07-24 | Discharge: 2020-07-24 | Disposition: A | Discharge status: Home / Self Care | Payer: Medicare Other | Source: Ambulatory Visit | Attending: Family Medicine | Admitting: Family Medicine

## 2020-07-24 ENCOUNTER — Encounter (HOSPITAL_COMMUNITY): Payer: Self-pay

## 2020-07-24 VITALS — BP 139/70 | HR 94 | Wt 285.4 lb

## 2020-07-24 DIAGNOSIS — Z8679 Personal history of other diseases of the circulatory system: Secondary | ICD-10-CM | POA: Diagnosis not present

## 2020-07-24 DIAGNOSIS — Z794 Long term (current) use of insulin: Secondary | ICD-10-CM

## 2020-07-24 DIAGNOSIS — Z7982 Long term (current) use of aspirin: Secondary | ICD-10-CM | POA: Insufficient documentation

## 2020-07-24 DIAGNOSIS — E119 Type 2 diabetes mellitus without complications: Secondary | ICD-10-CM | POA: Insufficient documentation

## 2020-07-24 DIAGNOSIS — Z8674 Personal history of sudden cardiac arrest: Secondary | ICD-10-CM | POA: Diagnosis not present

## 2020-07-24 DIAGNOSIS — Z79899 Other long term (current) drug therapy: Secondary | ICD-10-CM | POA: Diagnosis not present

## 2020-07-24 DIAGNOSIS — I110 Hypertensive heart disease with heart failure: Secondary | ICD-10-CM | POA: Insufficient documentation

## 2020-07-24 DIAGNOSIS — R5381 Other malaise: Secondary | ICD-10-CM | POA: Diagnosis not present

## 2020-07-24 DIAGNOSIS — Z7902 Long term (current) use of antithrombotics/antiplatelets: Secondary | ICD-10-CM | POA: Diagnosis not present

## 2020-07-24 DIAGNOSIS — I2510 Atherosclerotic heart disease of native coronary artery without angina pectoris: Secondary | ICD-10-CM

## 2020-07-24 DIAGNOSIS — Z87891 Personal history of nicotine dependence: Secondary | ICD-10-CM | POA: Diagnosis not present

## 2020-07-24 DIAGNOSIS — I10 Essential (primary) hypertension: Secondary | ICD-10-CM

## 2020-07-24 DIAGNOSIS — E1159 Type 2 diabetes mellitus with other circulatory complications: Secondary | ICD-10-CM

## 2020-07-24 DIAGNOSIS — I5032 Chronic diastolic (congestive) heart failure: Secondary | ICD-10-CM | POA: Diagnosis not present

## 2020-07-24 DIAGNOSIS — I469 Cardiac arrest, cause unspecified: Secondary | ICD-10-CM | POA: Insufficient documentation

## 2020-07-24 DIAGNOSIS — I255 Ischemic cardiomyopathy: Secondary | ICD-10-CM | POA: Insufficient documentation

## 2020-07-24 LAB — BASIC METABOLIC PANEL
Anion gap: 8 (ref 5–15)
BUN: 20 mg/dL (ref 8–23)
CO2: 26 mmol/L (ref 22–32)
Calcium: 8.8 mg/dL — ABNORMAL LOW (ref 8.9–10.3)
Chloride: 104 mmol/L (ref 98–111)
Creatinine, Ser: 0.98 mg/dL (ref 0.61–1.24)
GFR, Estimated: >60 mL/min (ref >60)
Glucose, Bld: 74 mg/dL (ref 70–99)
Potassium: 4 mmol/L (ref 3.5–5.1)
Sodium: 138 mmol/L (ref 135–145)

## 2020-07-24 MED ORDER — FUROSEMIDE 20 MG PO TABS: 20.0000 mg | ORAL_TABLET | Freq: Every day | ORAL | 2 refills | Status: DC | PRN

## 2020-07-24 MED ORDER — FUROSEMIDE 20 MG PO TABS: ORAL_TABLET | ORAL | 0 refills | Status: DC

## 2020-07-24 MED ORDER — CARVEDILOL 6.25 MG PO TABS: 6.2500 mg | ORAL_TABLET | Freq: Two times a day (BID) | ORAL | 3 refills | Status: DC

## 2020-07-24 NOTE — Patient Instructions (Signed)
EKG done today.  Labs done today. We will contact you only if your labs are abnormal.  INCREASE Carvedilol to 6.'25mg'$  (1 tablet) by mouth 2 times daily.  START Lasix '20mg'$  (1 tablet) by mouth daily for 3 days THEN DECREASE to 1 tablet by mouth daily as needed for swelling or a weight gain of 3 pounds or more in 24 hours.   No other medication changes were made. Please continue all current medications as prescribed.  Your physician recommends that you schedule a follow-up appointment in: 10 days for a lab only appointment(can be done in [place]) and in 3 months with Dr. Aundra Dubin  If you have any questions or concerns before your next appointment please send [place] a message through [place] or call our office at [phone].    TO LEAVE A MESSAGE FOR THE NURSE SELECT OPTION 2, PLEASE LEAVE A MESSAGE INCLUDING: YOUR NAME DATE OF BIRTH CALL BACK NUMBER REASON FOR CALL**this is important as we prioritize the call backs  YOU WILL RECEIVE A CALL BACK THE SAME DAY AS LONG AS YOU CALL BEFORE 4:00 PM   Do the following things EVERYDAY: Weigh yourself in the morning before breakfast. Write it down and keep it in a log. Take your medicines as prescribed Eat low salt foods--Limit salt (sodium) to 2000 mg per day.  Stay as active as you can everyday Limit all fluids for the day to less than 2 liters   At the [facility], you and your health needs are our priority. As part of our continuing mission to provide you with exceptional heart care, we have created designated Provider Care Teams. These Care Teams include your primary Cardiologist (physician) and Advanced Practice Providers (APPs- Physician Assistants and Nurse Practitioners) who all work together to provide you with the care you need, when you need it.   You may see any of the following providers on your designated Care Team at your next follow up: Dr Glori Bickers Dr Haynes Kerns, NP Lyda Jester, [place] Audry Riles, PharmD   Please be sure to bring in all your medications bottles to every appointment.

## 2020-07-24 NOTE — Progress Notes (Signed)
Advanced Heart Failure Clinic Note   PCP: [facility] Primary Cardiologist: Dr. Tamala Julian EP: Dr. Quentin Ore HF Cardiologist: Dr. Aundra Dubin  HPI: 65 y/o male w/ HTN, IDT2DM, HLD, VF arrest, and new chronic heart failure with mid-range EF.    He suffered witnessed out of hospital cardiac arrest 05/19/20 w/ immediate bystander CPR, initiated by wife, and prompt activation of EMS. Per report, had prolonged CPR, more than 45 min prior to ROSC. Underlying rhythm was VFib. Bedside echo showed EF 40-50 % inferobasal severe hypokinesis with septal bounce.  RV systolic function normal.    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 No other disease. LM, LAD and LCx all widely patent. No indication for PCI.   He required mechanical ventilation and pressor support, but did not require cardiac mechanical support. 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 He received an ICD for secondary prevention. He was discharged to CIR.   Today he returns for HF follow up with his wife. Overall feeling fine. BP at home~ 125-145/80s. Denies increasing SOB, CP, dizziness, edema, or PND/Orthopnea. Appetite ok. No fever or chills. Weight at home 275-280 pounds, does feel like he has gained weight. Taking all medications. Drinking a lot of fluid.  ECG: SR with LBBB qrs 162 ms (personally reviewed).  ICD interrogation: OptiVol stable, thoracic impedence down but near baseline, patient activity ~2 hrs/day, no VT/VF (personally reviewed).  ROS: All systems negative except as listed in HPI, PMH and Problem List.  SH:  Social History   Socioeconomic History   Marital status: Married    Spouse name: Not on file   Number of children: Not on file   Years of education: Not on file    Highest education level: Not on file  Occupational History   Not on file  Tobacco Use   Smoking status: Former    Packs/day: 1.50    Years: 42.00    Pack years: 63.00    Types: Cigarettes    Quit date: 09/03/2013    Years since quitting: 6.8   Smokeless tobacco: Former    Types: Chew   Tobacco comments:    We discussed the need to remain smoke free and to call me for help if he has the desire to smoke again.  Vaping Use   Vaping Use: Never used  Substance and Sexual Activity   Alcohol use: No    Alcohol/week: 0.0 standard drinks    Comment: recovering alcoholic    Drug use: No   Sexual activity: Not on file  Other Topics Concern   Not on file  Social History Narrative   Recovering alcoholic      Minister    Social Determinants of Health   Financial Resource Strain: Low Risk    Difficulty of Paying Living Expenses: Not very hard  Food Insecurity: No Food Insecurity   Worried About Charity fundraiser in the Last Year: Never true   Ran Out of Food in the Last Year: Never true  Transportation Needs: No Transportation Needs   Lack of Transportation (Medical): No   Lack of Transportation (Non-Medical): No  Physical Activity: Not on file  Stress: Not on file  Social Connections: Not on file  Intimate Partner Violence: Not on file   FH:  Family History  Problem Relation Age of  Onset   Cancer Mother        lung   Crohn's disease Father    Alcohol abuse Brother    Colon polyps Brother    Colon cancer Neg Hx    Past Medical History:  Diagnosis Date   Adenomatous polyp    Cataract    left eye for sure    Chronic pain    Diabetes mellitus    GERD (gastroesophageal reflux disease)    History of colonic polyps    History of hip replacement, total    Hx of anaphylactic shock    Hyperlipidemia    Hypertension    Left anterior hemiblock    Obesity    RBBB (right bundle branch block with left anterior fascicular block)    Trifascicular block    with first degree  av block   Current Outpatient Medications  Medication Sig Dispense Refill   aspirin EC 81 MG tablet Take 81 mg by mouth daily. Swallow whole.     atorvastatin (LIPITOR) 80 MG tablet Take 1 tablet (80 mg total) by mouth daily. 90 tablet 0   carboxymethylcellulose (REFRESH PLUS) 0.5 % SOLN Place 1 drop into both eyes 3 (three) times daily as needed (dry eyes).     carvedilol (COREG) 3.125 MG tablet Take 1 tablet (3.125 mg total) by mouth 2 (two) times daily with a meal. 28 tablet 0   clopidogrel (PLAVIX) 75 MG tablet Take 1 tablet (75 mg total) by mouth daily. 14 tablet 0   cyclobenzaprine (FLEXERIL) 5 MG tablet Take 1 tablet (5 mg total) by mouth 3 (three) times daily. 42 tablet 0   diclofenac sodium (VOLTAREN) 1 % GEL Apply 4 g topically 3 (three) times daily as needed (pain). 350 g 0   docusate sodium (COLACE) 100 MG capsule Take 1 capsule (100 mg total) by mouth 2 (two) times daily. 28 capsule 0   empagliflozin (JARDIANCE) 10 MG TABS tablet Take 1 tablet (10 mg total) by mouth daily. 14 tablet 0   gabapentin (NEURONTIN) 100 MG capsule Take 200 mg by mouth 4 (four) times daily.     insulin detemir (LEVEMIR) 100 UNIT/ML injection Inject 0.4 mLs (40 Units total) into the skin 2 (two) times daily. 10 mL 11   ketotifen (ZADITOR) 0.025 % ophthalmic solution Place 1 drop into both eyes 3 (three) times daily as needed (itchy eyes). 5 mL 0   lidocaine (LIDODERM) 5 % Place 1 patch onto the skin daily as needed (pain).     loratadine (CLARITIN) 10 MG tablet Take 10 mg by mouth daily.     Multiple Vitamins-Minerals (MULTIVITAMIN WITH MINERALS) tablet Take 1 tablet by mouth daily.     nitroGLYCERIN (NITROSTAT) 0.4 MG SL tablet Place 1 tablet (0.4 mg total) under the tongue every 5 (five) minutes x 3 doses as needed for chest pain. Call MD if you need two or more doses 30 tablet 12   oxyCODONE (OXY IR/ROXICODONE) 5 MG immediate release tablet Take 1-2 tablets (5-10 mg total) by mouth every 12 (twelve) hours  as needed for moderate pain. 28 tablet 0   pantoprazole (PROTONIX) 40 MG tablet Take 40 mg by mouth 2 (two) times daily before a meal.     potassium chloride SA (KLOR-CON) 20 MEQ tablet Take 1 tablet (20 mEq total) by mouth daily. 30 tablet 0   No current facility-administered medications for this encounter.   BP 139/70   Pulse 94   Wt 129.5 kg (285 lb  6.4 oz)   SpO2 96%   BMI 38.71 kg/m   Wt Readings from Last 3 Encounters:  07/24/20 129.5 kg (285 lb 6.4 oz)  06/26/20 127.1 kg (280 lb 3.2 oz)  06/05/20 126.6 kg (279 lb 1.6 oz)   PHYSICAL EXAM: General:  NAD. No resp difficulty HEENT: Normal Neck: Supple. Thick neck, JVD difficult. Carotids 2+ bilat; no bruits. No lymphadenopathy or thryomegaly appreciated. Cor: PMI nondisplaced. Regular rate & rhythm. No rubs, gallops or murmurs. Lungs: Clear Abdomen: Obese,  nontender, nondistended. No hepatosplenomegaly. No bruits or masses. Good bowel sounds. Extremities: No cyanosis, clubbing, rash, edema Neuro: Alert & oriented x 3, cranial nerves grossly intact. Moves all 4 extremities w/o difficulty. Affect pleasant.  ASSESSMENT & PLAN:  1. Chronic HF with mid-rage EF: Ischemic cardiomyopathy, EF in the 45% range on initial echo but now back up to 60-65% on repeat limited echo 5/26.  Suspect stunning with arrest and now improved.  Patient was markedly volume overloaded, but diuresed well.  - NYHA II. He does not appear overloaded today on exam or by OptiVol, however weight is up slightly. He endorses drinking >2L/day. - Counseled on limiting fluid to less than 2L/day. - He can take lasix 20 mg daily x 3 days only, then PRN only for weight gain/edema.  - Continue Jardiance 10 mg daily. BMET today. - Increase carvedilol 6.25 mg bid.   2. CAD: Patient appears to have CTO of the mid RCA with good collaterals.  I do not think this was acute MI from plaque rupture.  HS-TnI significantly elevated, but think this was generalized ischemia from  hypotension with prolonged cardiac arrest. - Continue ASA + Plavix.  - Continue statin. 3. Cardiac arrest: VF arrest with immediate CPR but prolonged time to ROSC (about 45 minutes). Suspect scar-mediated from old RCA occlusion. EF now normal.   - ICD placed 5/26 for secondary prevention.    - No driving for 6 months (~12/22). 4. Diabetes: On Jardiance and Levemir. 5. 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  No further HB. - ICD placed 5/26. 6. HTN: Better today. BP at home 120-140's. - Increase carvedilol as above. 7. Physical Deconditioning: continues with exercises at home.  Follow up in 3 months with Dr. Newman Nip, FNP-BC 07/24/20

## 2020-08-03 ENCOUNTER — Other Ambulatory Visit (HOSPITAL_COMMUNITY): Payer: Self-pay

## 2020-08-03 DIAGNOSIS — I5032 Chronic diastolic (congestive) heart failure: Secondary | ICD-10-CM

## 2020-08-03 NOTE — Addendum Note (Signed)
Addended by: Shela Nevin R on: [id number] 03:13 PM   Modules accepted: Orders

## 2020-08-03 NOTE — Progress Notes (Signed)
Order released to [facility]

## 2020-08-05 NOTE — Telephone Encounter (Signed)
After reaching out to the care team at the CHF location via message/chat. HIM was informed that the form had been completed back in early July. HIM called and spoke to the patient to inform him of his refund that will be available here at [address].  AO 08/05/20

## 2020-08-08 LAB — BASIC METABOLIC PANEL
BUN/Creatinine Ratio: 20 (ref 10–24)
BUN: 21 mg/dL (ref 8–27)
CO2: 25 mmol/L (ref 20–29)
Calcium: 8.8 mg/dL (ref 8.6–10.2)
Chloride: 101 mmol/L (ref 96–106)
Creatinine, Ser: 1.03 mg/dL (ref 0.76–1.27)
Glucose: 153 mg/dL — ABNORMAL HIGH (ref 65–99)
Potassium: 4.5 mmol/L (ref 3.5–5.2)
Sodium: 141 mmol/L (ref 134–144)
eGFR: 80 mL/min/{1.73_m2} (ref >59)

## 2020-09-01 ENCOUNTER — Encounter: Payer: No Typology Code available for payment source | Admitting: Cardiology

## 2020-09-04 ENCOUNTER — Ambulatory Visit (INDEPENDENT_AMBULATORY_CARE_PROVIDER_SITE_OTHER): Payer: No Typology Code available for payment source

## 2020-09-04 DIAGNOSIS — I469 Cardiac arrest, cause unspecified: Secondary | ICD-10-CM | POA: Diagnosis not present

## 2020-09-08 LAB — CUP PACEART REMOTE DEVICE CHECK
Battery Remaining Longevity: 130 mo
Battery Voltage: 3.1 V
Brady Statistic AP VP Percent: 0.03 %
Brady Statistic AP VS Percent: 16.25 %
Brady Statistic AS VP Percent: 0.04 %
Brady Statistic AS VS Percent: 83.68 %
Brady Statistic RA Percent Paced: 15.95 %
Brady Statistic RV Percent Paced: 0.07 %
Date Time Interrogation Session: 20220902033324
HighPow Impedance: 80 Ohm
Implantable Lead Implant Date: 20220526
Implantable Lead Implant Date: 20220526
Implantable Lead Location: 753859
Implantable Lead Location: 753860
Implantable Lead Model: 5076
Implantable Pulse Generator Implant Date: 20220526
Lead Channel Impedance Value: 399 Ohm
Lead Channel Impedance Value: 475 Ohm
Lead Channel Impedance Value: 532 Ohm
Lead Channel Pacing Threshold Amplitude: 0.5 V
Lead Channel Pacing Threshold Amplitude: 0.5 V
Lead Channel Pacing Threshold Pulse Width: 0.4 ms
Lead Channel Pacing Threshold Pulse Width: 0.4 ms
Lead Channel Sensing Intrinsic Amplitude: 3.25 mV
Lead Channel Sensing Intrinsic Amplitude: 3.25 mV
Lead Channel Sensing Intrinsic Amplitude: 7 mV
Lead Channel Sensing Intrinsic Amplitude: 7 mV
Lead Channel Setting Pacing Amplitude: 1.5 V
Lead Channel Setting Pacing Amplitude: 2 V
Lead Channel Setting Pacing Pulse Width: 0.4 ms
Lead Channel Setting Sensing Sensitivity: 0.3 mV

## 2020-09-16 NOTE — Progress Notes (Signed)
Remote ICD transmission.   

## 2020-10-14 ENCOUNTER — Ambulatory Visit (INDEPENDENT_AMBULATORY_CARE_PROVIDER_SITE_OTHER): Payer: Medicare Other | Admitting: Cardiology

## 2020-10-14 ENCOUNTER — Encounter: Payer: Self-pay | Admitting: Cardiology

## 2020-10-14 ENCOUNTER — Other Ambulatory Visit: Payer: Self-pay

## 2020-10-14 VITALS — BP 136/82 | HR 83 | Ht 72.0 in | Wt 295.0 lb

## 2020-10-14 DIAGNOSIS — Z95810 Presence of automatic (implantable) cardiac defibrillator: Secondary | ICD-10-CM | POA: Diagnosis not present

## 2020-10-14 DIAGNOSIS — I5042 Chronic combined systolic (congestive) and diastolic (congestive) heart failure: Secondary | ICD-10-CM | POA: Diagnosis not present

## 2020-10-14 DIAGNOSIS — I2510 Atherosclerotic heart disease of native coronary artery without angina pectoris: Secondary | ICD-10-CM

## 2020-10-14 DIAGNOSIS — I469 Cardiac arrest, cause unspecified: Secondary | ICD-10-CM

## 2020-10-14 NOTE — Progress Notes (Signed)
Electrophysiology Office Follow up Visit Note:    Date:  10/14/2020   ID:  [name], DOB [birth year], MRN [id number]  PCP:  Clinic, [facility]  [facility] Cardiologist:  Sinclair Grooms, MD  [place] Electrophysiologist:  Vickie Epley, MD    Interval History:    [name] is a 65 y.o. male who presents for a follow up visit after dual-chamber single coil ICD implant May 28, 2020.  He has done well since ICD implant.  He tells me he continues to get stronger since leaving the hospital.  Device interrogations have shown stable lead parameters.  He has experienced nonsustained ventricular tachycardia, maximal duration 7 beats.       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    Past Surgical History:  Procedure Laterality Date   APPENDECTOMY     CERVICAL FUSION     CERVICAL LAMINECTOMY     COLONOSCOPY     CORONARY/GRAFT ACUTE MI REVASCULARIZATION N/A 05/19/2020   Procedure: Coronary/Graft Acute MI Revascularization;  Surgeon: Belva Crome, MD;  Location: [place] [facility];  Service: Cardiovascular;  Laterality: N/A;   HAND SURGERY     HARDWARE REMOVAL Left 09/10/2012   Procedure: HARDWARE REMOVAL;  Surgeon: Jessy Oto, MD;  Location: [place];  Service: Orthopedics;  Laterality: Left;   HIP SURGERY  12/29/09   R THR  [name])   HIP SURGERY     bilateral   ICD IMPLANT N/A 05/28/2020   Procedure: ICD IMPLANT;  Surgeon: Vickie Epley, MD;  Location: [place] [facility];  Service: Cardiovascular;  Laterality: N/A;   LEFT  HEART CATH AND CORONARY ANGIOGRAPHY N/A 05/19/2020   Procedure: LEFT HEART CATH AND CORONARY ANGIOGRAPHY;  Surgeon: Belva Crome, MD;  Location: [place] [facility];  Service: Cardiovascular;  Laterality: N/A;   TOTAL HIP ARTHROPLASTY Left 09/10/2012   Procedure: REMOVAL OF HARDWARE LEFT PROXIMAL THIGH/FEMUR AND LEFT TOTAL HIP ARTHROPLASTY;  Surgeon: Jessy Oto, MD;  Location: [place];  Service: Orthopedics;  Laterality: Left;   TOTAL KNEE ARTHROPLASTY  01/2008   left knee    Current Medications: Current Meds  Medication Sig   aspirin EC 81 MG tablet Take 81 mg by mouth daily. Swallow whole.   atorvastatin (LIPITOR) 80 MG tablet Take 1 tablet (80 mg total) by mouth daily.   carboxymethylcellulose (REFRESH PLUS) 0.5 % SOLN Place 1 drop into both eyes 3 (three) times daily as needed (dry eyes).   carvedilol (COREG) 6.25 MG tablet Take 1 tablet (6.25 mg total) by mouth 2 (two) times daily with a meal.   clopidogrel (PLAVIX) 75 MG tablet Take 1 tablet (75 mg total) by mouth daily.   cyclobenzaprine (FLEXERIL) 5 MG tablet Take 1 tablet (5 mg total) by mouth 3 (three) times daily.   diclofenac sodium (VOLTAREN) 1 % GEL Apply 4 g topically 3 (three) times daily  as needed (pain).   docusate sodium (COLACE) 100 MG capsule Take 1 capsule (100 mg total) by mouth 2 (two) times daily.   empagliflozin (JARDIANCE) 10 MG TABS tablet Take 1 tablet (10 mg total) by mouth daily.   furosemide (LASIX) 20 MG tablet Take 1 tablet (20 mg total) by mouth daily as needed for fluid or edema.   furosemide (LASIX) 20 MG tablet Take Lasix daily for 3 days then decrease to daily as needed for swelling or weight gain   gabapentin (NEURONTIN) 100 MG capsule Take 200 mg by mouth 4 (four) times daily.   insulin detemir (LEVEMIR) 100 UNIT/ML injection Inject 0.4 mLs (40 Units total) into the skin 2 (two) times daily.   ketotifen (ZADITOR) 0.025 % ophthalmic solution Place 1 drop into both eyes 3 (three) times daily as needed  (itchy eyes).   lidocaine (LIDODERM) 5 % Place 1 patch onto the skin daily as needed (pain).   loratadine (CLARITIN) 10 MG tablet Take 10 mg by mouth daily.   Multiple Vitamins-Minerals (MULTIVITAMIN WITH MINERALS) tablet Take 1 tablet by mouth daily.   nitroGLYCERIN (NITROSTAT) 0.4 MG SL tablet Place 1 tablet (0.4 mg total) under the tongue every 5 (five) minutes x 3 doses as needed for chest pain. Call MD if you need two or more doses   oxyCODONE (OXY IR/ROXICODONE) 5 MG immediate release tablet Take 1-2 tablets (5-10 mg total) by mouth every 12 (twelve) hours as needed for moderate pain.   pantoprazole (PROTONIX) 40 MG tablet Take 40 mg by mouth 2 (two) times daily before a meal.   potassium chloride SA (KLOR-CON) 20 MEQ tablet Take 1 tablet (20 mEq total) by mouth daily.     Allergies:   Toradol [ketorolac tromethamine], Codeine, Morphine and related, and Naproxen   Social History   Socioeconomic History   Marital status: Married    Spouse name: Not on file   Number of children: Not on file   Years of education: Not on file   Highest education level: Not on file  Occupational History   Not on file  Tobacco Use   Smoking status: Former    Packs/day: 1.50    Years: 42.00    Pack years: 63.00    Types: Cigarettes    Quit date: 09/03/2013    Years since quitting: 7.1   Smokeless tobacco: Former    Types: Chew   Tobacco comments:    We discussed the need to remain smoke free and to call me for help if he has the desire to smoke again.  Vaping Use   Vaping Use: Never used  Substance and Sexual Activity   Alcohol use: No    Alcohol/week: 0.0 standard drinks    Comment: recovering alcoholic    Drug use: No   Sexual activity: Not on file  Other Topics Concern   Not on file  Social History Narrative   Recovering alcoholic      Minister    Social Determinants of Health   Financial Resource Strain: Low Risk    Difficulty of Paying Living Expenses: Not very hard  Food  Insecurity: No Food Insecurity   Worried About Charity fundraiser in the Last Year: Never true   Ran Out of Food in the Last Year: Never true  Transportation Needs: No Transportation Needs   Lack of Transportation (Medical): No   Lack of Transportation (Non-Medical): No  Physical Activity: Not on file  Stress: Not on file  Social  Connections: Not on file     Family History: The patient's family history includes Alcohol abuse in his brother; Cancer in his mother; Colon polyps in his brother; Crohn's disease in his father. There is no history of Colon cancer.  ROS:   Please see the history of present illness.    All other systems reviewed and are negative.  EKGs/Labs/Other Studies Reviewed:    The following studies were reviewed today:  October 14, 2020 device interrogation in clinic personally reviewed Battery longevity 10.8 years No ICD therapies Ventricular pacing less than 0.1% Lead parameters are stable   EKG:  The ekg ordered today demonstrates atrial paced, ventricular sensed rhythm, right bundle branch block, left anterior fascicular block.  Recent Labs: 05/25/2020: Magnesium 2.1 06/05/2020: Hemoglobin 12.4; Platelets 289 06/08/2020: ALT 37 08/07/2020: BUN 21; Creatinine, Ser 1.03; Potassium 4.5; Sodium 141  Recent Lipid Panel    Component Value Date/Time   CHOL 110 05/20/2020 0527   TRIG 160 (H) 05/20/2020 0527   HDL 18 (L) 05/20/2020 0527   CHOLHDL 6.1 05/20/2020 0527   VLDL 32 05/20/2020 0527   LDLCALC 60 05/20/2020 0527   LDLDIRECT 100.0 10/27/2015 1214    Physical Exam:    VS:  BP 136/82   Pulse 83   Ht 6' (1.829 m)   Wt 295 lb (133.8 kg)   SpO2 96%   BMI 40.01 kg/m     Wt Readings from Last 3 Encounters:  10/14/20 295 lb (133.8 kg)  07/24/20 285 lb 6.4 oz (129.5 kg)  06/26/20 280 lb 3.2 oz (127.1 kg)     GEN:  Well nourished, well developed in no acute distress.  Obese HEENT: Normal NECK: No JVD; No carotid bruits LYMPHATICS: No  lymphadenopathy CARDIAC: RRR, no murmurs, rubs, gallops.  ICD pocket well-healed. RESPIRATORY:  Clear to auscultation without rales, wheezing or rhonchi  ABDOMEN: Soft, non-tender, non-distended MUSCULOSKELETAL:  No edema; No deformity  SKIN: Warm and dry NEUROLOGIC:  Alert and oriented x 3 PSYCHIATRIC:  Normal affect        ASSESSMENT:    1. Cardiac arrest ([place])   2. Chronic combined systolic and diastolic heart failure ([place])   3. Coronary artery disease involving native heart without angina pectoris, unspecified vessel or lesion type   4. ICD (implantable cardioverter-defibrillator), dual, in situ    PLAN:    In order of problems listed above:  1. Cardiac arrest [facility]) Has made a remarkable recovery.  Doing very well since leaving the hospital.  2. Chronic combined systolic and diastolic heart failure (HCC) NYHA class II today.  Warm and dry on exam.  Follows with heart failure clinic.  Continue Coreg, Jardiance, Lasix.  3. Coronary artery disease involving native heart without angina pectoris, unspecified vessel or lesion type No ischemic symptoms today.  Continue aspirin, atorvastatin and Plavix.  4. ICD (implantable cardioverter-defibrillator), dual, in situ Device functioning well.  No therapies been delivered.  Continue remote monitoring.  Follow-up 9 months or sooner as needed.    Medication Adjustments/Labs and Tests Ordered: Current medicines are reviewed at length with the patient today.  Concerns regarding medicines are outlined above.  Orders Placed This Encounter  Procedures   EKG 12-Lead   No orders of the defined types were placed in this encounter.    Signed, Lars Mage, MD, [facility], [facility] 10/14/2020 8:04 PM    Electrophysiology [facility]

## 2020-10-14 NOTE — Patient Instructions (Addendum)
Medication Instructions:  Your physician recommends that you continue on your current medications as directed. Please refer to the Current Medication list given to you today. *If you need a refill on your cardiac medications before your next appointment, please call your pharmacy*  Lab Work: None ordered. If you have labs (blood work) drawn today and your tests are completely normal, you will receive your results only by: [place] (if you have MyChart) OR A paper copy in the mail If you have any lab test that is abnormal or we need to change your treatment, we will call you to review the results.  Testing/Procedures: None ordered.  Follow-Up: At [facility], you and your health needs are our priority.  As part of our continuing mission to provide you with exceptional heart care, we have created designated Provider Care Teams.  These Care Teams include your primary Cardiologist (physician) and Advanced Practice Providers (APPs -  Physician Assistants and Nurse Practitioners) who all work together to provide you with the care you need, when you need it.  Your next appointment:   Your physician wants you to follow-up in: 9 months You may see Vickie Epley, MD or one of the following Advanced Practice Providers on your designated Care Team:   Tommye Standard, [place] Legrand Como "Jonni Sanger" [place], [place] You will receive a reminder letter in the mail two months in advance. If you don't receive a letter, please call our office to schedule the follow-up appointment.  Remote monitoring is used to monitor your ICD from home. This monitoring reduces the number of office visits required to check your device to one time per year. It allows [place] to keep an eye on the functioning of your device to ensure it is working properly. You are scheduled for a device check from home on 12/04/2020. You may send your transmission at any time that day. If you have a wireless device, the transmission will be sent  automatically. After your physician reviews your transmission, you will receive a postcard with your next transmission date.

## 2020-10-30 ENCOUNTER — Encounter (HOSPITAL_COMMUNITY): Payer: Self-pay | Admitting: Cardiology

## 2020-10-30 ENCOUNTER — Ambulatory Visit (HOSPITAL_COMMUNITY)
Admission: RE | Admit: 2020-10-30 | Discharge: 2020-10-30 | Disposition: A | Discharge status: Home / Self Care | Payer: Medicare Other | Source: Ambulatory Visit | Attending: Cardiology | Admitting: Cardiology

## 2020-10-30 ENCOUNTER — Other Ambulatory Visit: Payer: Self-pay

## 2020-10-30 VITALS — BP 142/78 | HR 84 | Wt 294.4 lb

## 2020-10-30 DIAGNOSIS — Z7982 Long term (current) use of aspirin: Secondary | ICD-10-CM | POA: Diagnosis not present

## 2020-10-30 DIAGNOSIS — M4800 Spinal stenosis, site unspecified: Secondary | ICD-10-CM | POA: Diagnosis not present

## 2020-10-30 DIAGNOSIS — Z7902 Long term (current) use of antithrombotics/antiplatelets: Secondary | ICD-10-CM | POA: Diagnosis not present

## 2020-10-30 DIAGNOSIS — Z95810 Presence of automatic (implantable) cardiac defibrillator: Secondary | ICD-10-CM | POA: Insufficient documentation

## 2020-10-30 DIAGNOSIS — I5032 Chronic diastolic (congestive) heart failure: Secondary | ICD-10-CM | POA: Insufficient documentation

## 2020-10-30 DIAGNOSIS — I255 Ischemic cardiomyopathy: Secondary | ICD-10-CM | POA: Diagnosis not present

## 2020-10-30 DIAGNOSIS — Z8674 Personal history of sudden cardiac arrest: Secondary | ICD-10-CM | POA: Diagnosis not present

## 2020-10-30 DIAGNOSIS — I110 Hypertensive heart disease with heart failure: Secondary | ICD-10-CM | POA: Insufficient documentation

## 2020-10-30 DIAGNOSIS — E119 Type 2 diabetes mellitus without complications: Secondary | ICD-10-CM | POA: Insufficient documentation

## 2020-10-30 DIAGNOSIS — Z79899 Other long term (current) drug therapy: Secondary | ICD-10-CM | POA: Insufficient documentation

## 2020-10-30 DIAGNOSIS — I2510 Atherosclerotic heart disease of native coronary artery without angina pectoris: Secondary | ICD-10-CM | POA: Insufficient documentation

## 2020-10-30 DIAGNOSIS — Z87891 Personal history of nicotine dependence: Secondary | ICD-10-CM | POA: Diagnosis not present

## 2020-10-30 DIAGNOSIS — E785 Hyperlipidemia, unspecified: Secondary | ICD-10-CM | POA: Insufficient documentation

## 2020-10-30 DIAGNOSIS — Z794 Long term (current) use of insulin: Secondary | ICD-10-CM | POA: Diagnosis not present

## 2020-10-30 DIAGNOSIS — I5022 Chronic systolic (congestive) heart failure: Secondary | ICD-10-CM

## 2020-10-30 LAB — BASIC METABOLIC PANEL
Anion gap: 7 (ref 5–15)
BUN: 20 mg/dL (ref 8–23)
CO2: 26 mmol/L (ref 22–32)
Calcium: 8.9 mg/dL (ref 8.9–10.3)
Chloride: 105 mmol/L (ref 98–111)
Creatinine, Ser: 0.99 mg/dL (ref 0.61–1.24)
GFR, Estimated: >60 mL/min (ref >60)
Glucose, Bld: 100 mg/dL — ABNORMAL HIGH (ref 70–99)
Potassium: 4.1 mmol/L (ref 3.5–5.1)
Sodium: 138 mmol/L (ref 135–145)

## 2020-10-30 MED ORDER — ENTRESTO 24-26 MG PO TABS: 1.0000 | ORAL_TABLET | Freq: Two times a day (BID) | ORAL | 6 refills | Status: DC

## 2020-10-30 MED ORDER — CLOPIDOGREL BISULFATE 75 MG PO TABS: 75.0000 mg | ORAL_TABLET | Freq: Every day | ORAL | 0 refills | Status: DC

## 2020-10-30 NOTE — Patient Instructions (Signed)
Stop Potassium  Start Entresto 24/26 mg Twice daily   Labs done today, your results will be available in MyChart, we will contact you for abnormal readings.  Your physician recommends that you return for lab work in: 10-14 days, we have provided you with a prescription to have this done locally  Your physician recommends that you schedule a follow-up appointment in: 3 months with echocardiogram  If you have any questions or concerns before your next appointment please send [place] a message through [place] or call our office at [phone].    TO LEAVE A MESSAGE FOR THE NURSE SELECT OPTION 2, PLEASE LEAVE A MESSAGE INCLUDING: YOUR NAME DATE OF BIRTH CALL BACK NUMBER REASON FOR CALL**this is important as we prioritize the call backs  YOU WILL RECEIVE A CALL BACK THE SAME DAY AS LONG AS YOU CALL BEFORE 4:00 PM  At the [facility], you and your health needs are our priority. As part of our continuing mission to provide you with exceptional heart care, we have created designated Provider Care Teams. These Care Teams include your primary Cardiologist (physician) and Advanced Practice Providers (APPs- Physician Assistants and Nurse Practitioners) who all work together to provide you with the care you need, when you need it.   You may see any of the following providers on your designated Care Team at your next follow up: Dr Glori Bickers Dr Haynes Kerns, NP Lyda Jester, [place] [facility] [place], [place] Audry Riles, PharmD   Please be sure to bring in all your medications bottles to every appointment.

## 2020-11-01 NOTE — Progress Notes (Signed)
Advanced Heart Failure Clinic Note   PCP: [facility] EP: Dr. Quentin Ore HF Cardiologist: Dr. Aundra Dubin  HPI: 65 y.o. male w/ HTN, DM2, HLD, VF arrest, and ischemic cardiomyopathy.    He suffered witnessed out of hospital cardiac arrest 05/19/20 w/ immediate bystander CPR, initiated by wife, and prompt activation of EMS. Per report, had prolonged CPR, more than 45 min prior to ROSC. Underlying rhythm was VFib. Bedside echo showed EF 40-50% inferobasal severe hypokinesis with septal bounce.  RV systolic function normal. 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 No other disease. LM, LAD and LCx all widely patent. No indication for PCI.  Suspect scar-mediated VF.  He required mechanical ventilation and pressor support, but did not require cardiac mechanical support. 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 He received a Medtronic ICD for secondary prevention. He was discharged to CIR.   Today he returns for HF followup.  He takes Lasix rarely.  BP is high.  No chest pain.  No palpitations or lightheadedness.  Occasional dyspnea with heavy exertion.  He exercises regularly, lifts weights and uses stationary bike.  He has back pain from spinal stenosis.  No problems with heavier exertion such as using his chainsaw.  ECG: NSR, RBBB, LAFB, old inferior MI (personally reviewed).   Labs (8/22): K 4.5, creatinine 1.03  Medtronic device interrogation: stable thoracic impedance.   PMH: 1. Type 2 diabetes 2. GERD 3. HTN 4. Hyperlipidemia.  5. 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  - Medtronic ICD.  6. CAD: LHC 5/22 showed chronic occlusion of RCA with L=>R collaterals.  7. Transient complete heart block in 5/22.  8.  Ischemic cardiomyopathy: Echo post-arrest in 5/22 showed EF 45%, repeat echo in 5/22 showed EF up to 60-65%.   ROS: All systems negative except as listed in HPI, PMH and Problem List.  SH:  Social History   Socioeconomic History   Marital status: Married    Spouse name: Not on file   Number of children: Not on file   Years of education: Not on file   Highest education level: Not on file  Occupational History   Not on file  Tobacco Use   Smoking status: Former    Packs/day: 1.50    Years: 42.00    Pack years: 63.00    Types: Cigarettes    Quit date: 09/03/2013    Years since quitting: 7.1   Smokeless tobacco: Former    Types: Chew   Tobacco comments:    We discussed the need to remain smoke free and to call me for help if he has the desire to smoke again.  Vaping Use   Vaping Use: Never used  Substance and Sexual Activity   Alcohol use: No    Alcohol/week: 0.0 standard drinks    Comment: recovering alcoholic    Drug use: No   Sexual activity: Not on file  Other Topics Concern   Not on file  Social History Narrative   Recovering alcoholic      Minister    Social Determinants of Health   Financial Resource Strain: Low Risk    Difficulty of Paying Living Expenses: Not very hard  Food Insecurity: No Food Insecurity   Worried About Running Out of Food in the Last Year: Never true  Ran Out of Food in the Last Year: Never true  Transportation Needs: No Transportation Needs   Lack of Transportation (Medical): No   Lack of Transportation (Non-Medical): No  Physical Activity: Not on file  Stress: Not on file  Social Connections: Not on file  Intimate Partner Violence: Not on file   FH:  Family History  Problem Relation Age of Onset   Cancer Mother        lung   Crohn's disease Father    Alcohol abuse Brother    Colon polyps Brother    Colon cancer Neg Hx    Current Outpatient Medications  Medication Sig Dispense Refill   aspirin EC 81 MG tablet Take 81 mg by  mouth daily. Swallow whole.     atorvastatin (LIPITOR) 80 MG tablet Take 1 tablet (80 mg total) by mouth daily. 90 tablet 0   carboxymethylcellulose (REFRESH PLUS) 0.5 % SOLN Place 1 drop into both eyes 3 (three) times daily as needed (dry eyes).     carvedilol (COREG) 6.25 MG tablet Take 1 tablet (6.25 mg total) by mouth 2 (two) times daily with a meal. 180 tablet 3   cyclobenzaprine (FLEXERIL) 5 MG tablet Take 1 tablet (5 mg total) by mouth 3 (three) times daily. 42 tablet 0   diclofenac sodium (VOLTAREN) 1 % GEL Apply 4 g topically 3 (three) times daily as needed (pain). 350 g 0   docusate sodium (COLACE) 100 MG capsule Take 1 capsule (100 mg total) by mouth 2 (two) times daily. 28 capsule 0   empagliflozin (JARDIANCE) 10 MG TABS tablet Take 1 tablet (10 mg total) by mouth daily. 14 tablet 0   furosemide (LASIX) 20 MG tablet Take 1 tablet (20 mg total) by mouth daily as needed for fluid or edema. 30 tablet 2   gabapentin (NEURONTIN) 100 MG capsule Take 200 mg by mouth 4 (four) times daily.     insulin aspart (NOVOLOG) 100 UNIT/ML injection 40 units in AM, 40 units at Noon, and 50 units at supper     insulin detemir (LEVEMIR) 100 UNIT/ML injection Inject 80 Units into the skin daily.     ketotifen (ZADITOR) 0.025 % ophthalmic solution Place 1 drop into both eyes 3 (three) times daily as needed (itchy eyes). 5 mL 0   lidocaine (LIDODERM) 5 % Place 1 patch onto the skin daily as needed (pain).     loratadine (CLARITIN) 10 MG tablet Take 10 mg by mouth daily.     Multiple Vitamins-Minerals (MULTIVITAMIN WITH MINERALS) tablet Take 1 tablet by mouth daily.     nitroGLYCERIN (NITROSTAT) 0.4 MG SL tablet Place 1 tablet (0.4 mg total) under the tongue every 5 (five) minutes x 3 doses as needed for chest pain. Call MD if you need two or more doses 30 tablet 12   oxyCODONE (OXY IR/ROXICODONE) 5 MG immediate release tablet Take 1-2 tablets (5-10 mg total) by mouth every 12 (twelve) hours as needed for  moderate pain. 28 tablet 0   pantoprazole (PROTONIX) 40 MG tablet Take 40 mg by mouth 2 (two) times daily before a meal.     sacubitril-valsartan (ENTRESTO) 24-26 MG Take 1 tablet by mouth 2 (two) times daily. 60 tablet 6   clopidogrel (PLAVIX) 75 MG tablet Take 1 tablet (75 mg total) by mouth daily. 14 tablet 0   No current facility-administered medications for this encounter.   BP (!) 142/78   Pulse 84   Wt 133.5 kg (294 lb 6.4 oz)  SpO2 94%   BMI 39.93 kg/m   Wt Readings from Last 3 Encounters:  10/30/20 133.5 kg (294 lb 6.4 oz)  10/14/20 133.8 kg (295 lb)  07/24/20 129.5 kg (285 lb 6.4 oz)   PHYSICAL EXAM: General: NAD Neck: No JVD, no thyromegaly or thyroid nodule.  Lungs: Clear to auscultation bilaterally with normal respiratory effort. CV: Nondisplaced PMI.  Heart regular S1/S2, no S3/S4, no murmur.  No peripheral edema.  No carotid bruit.  Normal pedal pulses.  Abdomen: Soft, nontender, no hepatosplenomegaly, no distention.  Skin: Intact without lesions or rashes.  Neurologic: Alert and oriented x 3.  Psych: Normal affect. Extremities: No clubbing or cyanosis.  HEENT: Normal.   ASSESSMENT & PLAN:  1. Chronic diastolic CHF: Ischemic cardiomyopathy, EF in the 45% range on initial echo in 5/22 but back up to 60-65% on repeat limited echo also in 5/22.  Suspect stunning with arrest followed by improvement.  NYHA class II, rarely takes prn Lasix.  He is not volume overloaded on exam or by Optivol.  - Continue Jardiance 10 mg daily.  - Continue carvedilol 6.25 mg bid.   - With elevated BP, I will add Entresto 24/26 bid.  BMET today and in 10 days.  - Repeat echo at followup in 3 months.  2. CAD: LHC in 5/22 showed CTO of the mid RCA with good collaterals.  I do not think this was acute MI from plaque rupture.   - Continue ASA + Plavix.  - Continue statin. 3. Cardiac arrest: In 5/22. Suspect scar-mediated from old RCA occlusion. EF now normal.  Medtronic ICD for secondary  prevention.     - No driving for 6 months (~12/22). 4. Diabetes: On Jardiance and Levemir. 5. 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  No further HB. 6. HTN: BP still elevated.  - Starting Entresto.   Follow up in 3 months with echo  Loralie Champagne 11/01/2020

## 2020-11-16 ENCOUNTER — Other Ambulatory Visit: Payer: Self-pay | Admitting: Cardiology

## 2020-11-18 LAB — BASIC METABOLIC PANEL
BUN/Creatinine Ratio: 21 (ref 10–24)
BUN: 22 mg/dL (ref 8–27)
CO2: 19 mmol/L — ABNORMAL LOW (ref 20–29)
Calcium: 9.3 mg/dL (ref 8.6–10.2)
Chloride: 101 mmol/L (ref 96–106)
Creatinine, Ser: 1.04 mg/dL (ref 0.76–1.27)
Glucose: 81 mg/dL (ref 70–99)
Potassium: 4.3 mmol/L (ref 3.5–5.2)
Sodium: 141 mmol/L (ref 134–144)
eGFR: 79 mL/min/{1.73_m2} (ref >59)

## 2020-12-04 ENCOUNTER — Ambulatory Visit (INDEPENDENT_AMBULATORY_CARE_PROVIDER_SITE_OTHER): Payer: No Typology Code available for payment source

## 2020-12-04 DIAGNOSIS — I469 Cardiac arrest, cause unspecified: Secondary | ICD-10-CM

## 2020-12-04 LAB — CUP PACEART REMOTE DEVICE CHECK
Battery Remaining Longevity: 128 mo
Battery Voltage: 3.05 V
Brady Statistic AP VP Percent: 0.01 %
Brady Statistic AP VS Percent: 11.66 %
Brady Statistic AS VP Percent: 0.04 %
Brady Statistic AS VS Percent: 88.3 %
Brady Statistic RA Percent Paced: 11.52 %
Brady Statistic RV Percent Paced: 0.05 %
Date Time Interrogation Session: 20221202033522
HighPow Impedance: 81 Ohm
Implantable Lead Implant Date: 20220526
Implantable Lead Implant Date: 20220526
Implantable Lead Location: 753859
Implantable Lead Location: 753860
Implantable Lead Model: 5076
Implantable Pulse Generator Implant Date: 20220526
Lead Channel Impedance Value: 399 Ohm
Lead Channel Impedance Value: 418 Ohm
Lead Channel Impedance Value: 513 Ohm
Lead Channel Pacing Threshold Amplitude: 0.375 V
Lead Channel Pacing Threshold Amplitude: 0.625 V
Lead Channel Pacing Threshold Pulse Width: 0.4 ms
Lead Channel Pacing Threshold Pulse Width: 0.4 ms
Lead Channel Sensing Intrinsic Amplitude: 3 mV
Lead Channel Sensing Intrinsic Amplitude: 3 mV
Lead Channel Sensing Intrinsic Amplitude: 7.125 mV
Lead Channel Sensing Intrinsic Amplitude: 7.125 mV
Lead Channel Setting Pacing Amplitude: 1.5 V
Lead Channel Setting Pacing Amplitude: 2 V
Lead Channel Setting Pacing Pulse Width: 0.4 ms
Lead Channel Setting Sensing Sensitivity: 0.3 mV

## 2020-12-15 NOTE — Progress Notes (Signed)
Remote ICD transmission.   

## 2021-01-05 ENCOUNTER — Telehealth (HOSPITAL_COMMUNITY): Payer: Self-pay | Admitting: Cardiology

## 2021-01-05 NOTE — Telephone Encounter (Signed)
Pt appt [facility]/23 [facility] is requesting summary of last visit and request for future appt approval, fax [phone], [facility], please advise

## 2021-01-06 NOTE — Telephone Encounter (Signed)
Community care referral and last OV faxed

## 2021-02-26 ENCOUNTER — Ambulatory Visit (HOSPITAL_BASED_OUTPATIENT_CLINIC_OR_DEPARTMENT_OTHER)
Admission: RE | Admit: 2021-02-26 | Discharge: 2021-02-26 | Disposition: A | Discharge status: Home / Self Care | Payer: No Typology Code available for payment source | Source: Ambulatory Visit | Attending: Cardiology | Admitting: Cardiology

## 2021-02-26 ENCOUNTER — Other Ambulatory Visit: Payer: Self-pay

## 2021-02-26 ENCOUNTER — Ambulatory Visit (HOSPITAL_COMMUNITY)
Admission: RE | Admit: 2021-02-26 | Discharge: 2021-02-26 | Disposition: A | Discharge status: Home / Self Care | Payer: No Typology Code available for payment source | Source: Ambulatory Visit | Attending: Cardiology | Admitting: Cardiology

## 2021-02-26 VITALS — Wt 304.0 lb

## 2021-02-26 DIAGNOSIS — I5022 Chronic systolic (congestive) heart failure: Secondary | ICD-10-CM

## 2021-02-26 DIAGNOSIS — I509 Heart failure, unspecified: Secondary | ICD-10-CM | POA: Diagnosis not present

## 2021-02-26 DIAGNOSIS — I255 Ischemic cardiomyopathy: Secondary | ICD-10-CM | POA: Insufficient documentation

## 2021-02-26 DIAGNOSIS — E785 Hyperlipidemia, unspecified: Secondary | ICD-10-CM | POA: Insufficient documentation

## 2021-02-26 DIAGNOSIS — I469 Cardiac arrest, cause unspecified: Secondary | ICD-10-CM | POA: Diagnosis not present

## 2021-02-26 DIAGNOSIS — I110 Hypertensive heart disease with heart failure: Secondary | ICD-10-CM | POA: Insufficient documentation

## 2021-02-26 DIAGNOSIS — I2510 Atherosclerotic heart disease of native coronary artery without angina pectoris: Secondary | ICD-10-CM | POA: Diagnosis not present

## 2021-02-26 DIAGNOSIS — E669 Obesity, unspecified: Secondary | ICD-10-CM | POA: Diagnosis not present

## 2021-02-26 DIAGNOSIS — I5032 Chronic diastolic (congestive) heart failure: Secondary | ICD-10-CM | POA: Diagnosis not present

## 2021-02-26 DIAGNOSIS — E119 Type 2 diabetes mellitus without complications: Secondary | ICD-10-CM | POA: Insufficient documentation

## 2021-02-26 LAB — LIPID PANEL
Cholesterol: 156 mg/dL (ref 0–200)
HDL: 31 mg/dL — ABNORMAL LOW (ref >40)
LDL Cholesterol: 54 mg/dL (ref 0–99)
Total CHOL/HDL Ratio: 5 RATIO
Triglycerides: 357 mg/dL — ABNORMAL HIGH (ref <150)
VLDL: 71 mg/dL — ABNORMAL HIGH (ref 0–40)

## 2021-02-26 LAB — BASIC METABOLIC PANEL
Anion gap: 11 (ref 5–15)
BUN: 19 mg/dL (ref 8–23)
CO2: 23 mmol/L (ref 22–32)
Calcium: 8.8 mg/dL — ABNORMAL LOW (ref 8.9–10.3)
Chloride: 105 mmol/L (ref 98–111)
Creatinine, Ser: 1.05 mg/dL (ref 0.61–1.24)
GFR, Estimated: >60 mL/min (ref >60)
Glucose, Bld: 143 mg/dL — ABNORMAL HIGH (ref 70–99)
Potassium: 4.1 mmol/L (ref 3.5–5.1)
Sodium: 139 mmol/L (ref 135–145)

## 2021-02-26 LAB — ECHOCARDIOGRAM COMPLETE
AR max vel: 4.07 cm2
AV Peak grad: 3.9 mmHg
Ao pk vel: 0.99 m/s
Area-P 1/2: 4.01 cm2
S' Lateral: 4.5 cm

## 2021-02-26 LAB — HEMOGLOBIN A1C
Hgb A1c MFr Bld: 6.3 % — ABNORMAL HIGH (ref 4.8–5.6)
Mean Plasma Glucose: 134.11 mg/dL

## 2021-02-26 MED ORDER — PERFLUTREN LIPID MICROSPHERE
1.0000 mL | INTRAVENOUS | Status: DC | PRN
  Administered 2021-02-26: 6 mL via INTRAVENOUS
  Filled 2021-02-26: qty 10

## 2021-02-26 NOTE — Patient Instructions (Signed)
Thank you for your visit today.  No changes to your medications.  Labs done today, your results will be available in MyChart, we will contact you for abnormal readings.  You have been referred to pharmacy clinic for [facility]. They will call you with your appointment.   Your physician recommends that you schedule a follow-up appointment in: 3 months   If you have any questions or concerns before your next appointment please send [place] a message through [place] or call our office at [phone].    TO LEAVE A MESSAGE FOR THE NURSE SELECT OPTION 2, PLEASE LEAVE A MESSAGE INCLUDING: YOUR NAME DATE OF BIRTH CALL BACK NUMBER REASON FOR CALL**this is important as we prioritize the call backs  YOU WILL RECEIVE A CALL BACK THE SAME DAY AS LONG AS YOU CALL BEFORE 4:00 PM At the [facility], you and your health needs are our priority. As part of our continuing mission to provide you with exceptional heart care, we have created designated Provider Care Teams. These Care Teams include your primary Cardiologist (physician) and Advanced Practice Providers (APPs- Physician Assistants and Nurse Practitioners) who all work together to provide you with the care you need, when you need it.   You may see any of the following providers on your designated Care Team at your next follow up: Dr Glori Bickers Dr Haynes Kerns, NP Lyda Jester, [place] [facility] [place], [place] Audry Riles, PharmD   Please be sure to bring in all your medications bottles to every appointment.

## 2021-02-26 NOTE — Progress Notes (Signed)
Echocardiogram 2D Echocardiogram has been performed.  [name] 02/26/2021, 2:56 PM

## 2021-02-28 NOTE — Progress Notes (Signed)
Advanced Heart Failure Clinic Note   PCP: [facility] EP: Dr. Quentin Ore HF Cardiologist: Dr. Aundra Dubin  HPI: 66 y.o. male w/ HTN, DM2, HLD, VF arrest, and ischemic cardiomyopathy.    He suffered witnessed out of hospital cardiac arrest 05/19/20 w/ immediate bystander CPR, initiated by wife, and prompt activation of EMS. Per report, had prolonged CPR, more than 45 min prior to ROSC. Underlying rhythm was VFib. Bedside echo showed EF 40-50% inferobasal severe hypokinesis with septal bounce.  RV systolic function normal. 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 No other disease. LM, LAD and LCx all widely patent. No indication for PCI.  Suspect scar-mediated VF.  He required mechanical ventilation and pressor support, but did not require cardiac mechanical support. 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 He received a Medtronic ICD for secondary prevention. He was discharged to CIR.   Echo was done today and reviewed, EF 50-55% with inferior hypokinesis, normal RV, and normal IVC.   Today he returns for HF followup.  Main complaint is back pain, he cannot walk much.  He did join the [employer] to use the machines.  He can walk about 1/8 mile without dyspnea, stopped by back pain.  No dyspnea generally on flat ground.  Rides exercise bike for 20-30 minutes.  Using CPAP at night.  No chest pain.  Weight up 10 lbs.   ECG: NSR, RBBB, LAFB, old inferior MI (personally reviewed).   Labs (8/22): K 4.5, creatinine 1.03 Labs (11/22): K 4.3, creatinine 1.04  Medtronic device interrogation: no VT/AF, stable thoracic impedance.    PMH: 1. Type 2 diabetes 2. GERD 3. HTN 4. Hyperlipidemia.  5. 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   - Medtronic ICD.  6. CAD: LHC 5/22 showed chronic occlusion of RCA with L=>R collaterals.  7. Transient complete heart block in 5/22.  8. Ischemic cardiomyopathy: Echo post-arrest in 5/22 showed EF 45%, repeat echo in 5/22 showed EF up to 60-65%.  - Echo (2/23): EF 50-55% with inferior hypokinesis, normal RV, and normal IVC.  9. OSA: Uses CPAP.   ROS: All systems negative except as listed in HPI, PMH and Problem List.  SH:  Social History   Socioeconomic History   Marital status: Married    Spouse name: Not on file   Number of children: Not on file   Years of education: Not on file   Highest education level: Not on file  Occupational History   Not on file  Tobacco Use   Smoking status: Former    Packs/day: 1.50    Years: 42.00    Pack years: 63.00    Types: Cigarettes    Quit date: 09/03/2013    Years since quitting: 7.4   Smokeless tobacco: Former    Types: Chew   Tobacco comments:    We discussed the need to remain smoke free and to call me for help if he has the desire to smoke again.  Vaping Use   Vaping Use: Never used  Substance and Sexual Activity   Alcohol use: No    Alcohol/week: 0.0 standard drinks    Comment: recovering alcoholic    Drug use: No   Sexual activity: Not on file  Other Topics Concern   Not on file  Social History Narrative  Recovering alcoholic      Minister    Social Determinants of Health   Financial Resource Strain: Low Risk    Difficulty of Paying Living Expenses: Not very hard  Food Insecurity: No Food Insecurity   Worried About Charity fundraiser in the Last Year: Never true   Ran Out of Food in the Last Year: Never true  Transportation Needs: No Transportation Needs   Lack of Transportation (Medical): No   Lack of Transportation (Non-Medical): No  Physical Activity: Not on file  Stress: Not on file  Social Connections: Not on file  Intimate Partner Violence: Not on file   FH:  Family History  Problem Relation Age of  Onset   Cancer Mother        lung   Crohn's disease Father    Alcohol abuse Brother    Colon polyps Brother    Colon cancer Neg Hx    Current Outpatient Medications  Medication Sig Dispense Refill   aspirin EC 81 MG tablet Take 81 mg by mouth daily. Swallow whole.     atorvastatin (LIPITOR) 80 MG tablet Take 1 tablet (80 mg total) by mouth daily. 90 tablet 0   carboxymethylcellulose (REFRESH PLUS) 0.5 % SOLN Place 1 drop into both eyes 3 (three) times daily as needed (dry eyes).     carvedilol (COREG) 6.25 MG tablet Take 1 tablet (6.25 mg total) by mouth 2 (two) times daily with a meal. 180 tablet 3   clopidogrel (PLAVIX) 75 MG tablet Take 1 tablet (75 mg total) by mouth daily. 14 tablet 0   cyclobenzaprine (FLEXERIL) 5 MG tablet Take 1 tablet (5 mg total) by mouth 3 (three) times daily. 42 tablet 0   diclofenac sodium (VOLTAREN) 1 % GEL Apply 4 g topically 3 (three) times daily as needed (pain). 350 g 0   docusate sodium (COLACE) 100 MG capsule Take 1 capsule (100 mg total) by mouth 2 (two) times daily. 28 capsule 0   empagliflozin (JARDIANCE) 25 MG TABS tablet Take 25 mg by mouth daily. 1/2 tablet     furosemide (LASIX) 20 MG tablet Take 1 tablet (20 mg total) by mouth daily as needed for fluid or edema. 30 tablet 2   gabapentin (NEURONTIN) 100 MG capsule Take 100 mg by mouth 4 (four) times daily.     insulin aspart (NOVOLOG) 100 UNIT/ML injection 40 units in AM, 40 units at Noon, and 50 units at supper     insulin detemir (LEVEMIR) 100 UNIT/ML injection Inject 80 Units into the skin daily.     ketotifen (ZADITOR) 0.025 % ophthalmic solution Place 1 drop into both eyes 3 (three) times daily as needed (itchy eyes). 5 mL 0   loratadine (CLARITIN) 10 MG tablet Take 10 mg by mouth daily.     Multiple Vitamins-Minerals (MULTIVITAMIN WITH MINERALS) tablet Take 1 tablet by mouth daily.     nitroGLYCERIN (NITROSTAT) 0.4 MG SL tablet Place 1 tablet (0.4 mg total) under the tongue every 5 (five)  minutes x 3 doses as needed for chest pain. Call MD if you need two or more doses 30 tablet 12   pantoprazole (PROTONIX) 40 MG tablet Take 40 mg by mouth 2 (two) times daily before a meal.     sacubitril-valsartan (ENTRESTO) 24-26 MG Take 1 tablet by mouth 2 (two) times daily. 60 tablet 6   No current facility-administered medications for this encounter.   Wt (!) 137.9 kg (304 lb)    BMI 41.23  kg/m   Wt Readings from Last 3 Encounters:  02/26/21 (!) 137.9 kg (304 lb)  10/30/20 133.5 kg (294 lb 6.4 oz)  10/14/20 133.8 kg (295 lb)   PHYSICAL EXAM: General: NAD, obesity.  Neck: No JVD, no thyromegaly or thyroid nodule.  Lungs: Clear to auscultation bilaterally with normal respiratory effort. CV: Nondisplaced PMI.  Heart regular S1/S2, no S3/S4, no murmur.  No peripheral edema.  No carotid bruit.  Normal pedal pulses.  Abdomen: Soft, nontender, no hepatosplenomegaly, no distention.  Skin: Intact without lesions or rashes.  Neurologic: Alert and oriented x 3.  Psych: Normal affect. Extremities: No clubbing or cyanosis.  HEENT: Normal.   ASSESSMENT & PLAN:  1. Chronic diastolic CHF: Ischemic cardiomyopathy, EF in the 45% range on initial echo in 5/22 but back up to 60-65% on repeat limited echo also in 5/22.  Echo was done today and reviewed, EF 50-55% with inferior hypokinesis.  NYHA class II, not volume overloaded by exam or Optivol.  - Continue Jardiance 10 mg daily.  - Continue carvedilol 6.25 mg bid.   - Continue Entresto 24/26 bid.  BMET today.  2. CAD: LHC in 5/22 showed CTO of the mid RCA with good collaterals.  I do not think this was acute MI from plaque rupture.   - Continue ASA + Plavix.  - Continue statin.  Check lipids today.  3. Cardiac arrest: In 5/22. Suspect scar-mediated from old RCA occlusion. EF now low normal to mildly reduced.  Medtronic ICD for secondary prevention.     4. Diabetes: On Jardiance and Levemir. 5. 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  No further HB. 6. HTN: BP borderline elevated, no changes today.  7. Obesity: Patient's weight is up and he has struggled to lose weight given back pain.  -  I will refer him to pharmacy clinic for semaglutide for weight loss.   Follow up in 3 months  Loralie Champagne 02/28/2021

## 2021-03-03 ENCOUNTER — Telehealth (HOSPITAL_COMMUNITY): Payer: Self-pay

## 2021-03-03 MED ORDER — ICOSAPENT ETHYL 1 G PO CAPS: 2.0000 g | ORAL_CAPSULE | Freq: Two times a day (BID) | ORAL | 11 refills | Status: DC

## 2021-03-03 NOTE — Telephone Encounter (Signed)
Last visit note faxed Dr. Carron Curie Physician  per patient request.  ?

## 2021-03-03 NOTE — Telephone Encounter (Addendum)
Pt aware, agreeable, and verbalized understanding ? ? ?----- Message from Larey Dresser, MD sent at 02/28/2021 11:22 PM EST ----- ?Good LDL, high triglycerides.  Start Vascepa 2 g bid, lipids 2 months.  ?

## 2021-03-05 ENCOUNTER — Ambulatory Visit (INDEPENDENT_AMBULATORY_CARE_PROVIDER_SITE_OTHER): Payer: No Typology Code available for payment source

## 2021-03-05 ENCOUNTER — Telehealth: Payer: Self-pay | Admitting: Pharmacist

## 2021-03-05 DIAGNOSIS — I5022 Chronic systolic (congestive) heart failure: Secondary | ICD-10-CM

## 2021-03-05 LAB — CUP PACEART REMOTE DEVICE CHECK
Battery Remaining Longevity: 125 mo
Battery Voltage: 3.03 V
Brady Statistic AP VP Percent: 0.02 %
Brady Statistic AP VS Percent: 18.32 %
Brady Statistic AS VP Percent: 0.05 %
Brady Statistic AS VS Percent: 81.61 %
Brady Statistic RA Percent Paced: 18.18 %
Brady Statistic RV Percent Paced: 0.07 %
Date Time Interrogation Session: 20230303044224
HighPow Impedance: 85 Ohm
Implantable Lead Implant Date: 20220526
Implantable Lead Implant Date: 20220526
Implantable Lead Location: 753859
Implantable Lead Location: 753860
Implantable Lead Model: 5076
Implantable Pulse Generator Implant Date: 20220526
Lead Channel Impedance Value: 418 Ohm
Lead Channel Impedance Value: 513 Ohm
Lead Channel Impedance Value: 532 Ohm
Lead Channel Pacing Threshold Amplitude: 0.375 V
Lead Channel Pacing Threshold Amplitude: 0.625 V
Lead Channel Pacing Threshold Pulse Width: 0.4 ms
Lead Channel Pacing Threshold Pulse Width: 0.4 ms
Lead Channel Sensing Intrinsic Amplitude: 3.125 mV
Lead Channel Sensing Intrinsic Amplitude: 3.125 mV
Lead Channel Sensing Intrinsic Amplitude: 6.625 mV
Lead Channel Sensing Intrinsic Amplitude: 6.625 mV
Lead Channel Setting Pacing Amplitude: 1.5 V
Lead Channel Setting Pacing Amplitude: 2 V
Lead Channel Setting Pacing Pulse Width: 0.4 ms
Lead Channel Setting Sensing Sensitivity: 0.3 mV

## 2021-03-05 NOTE — Telephone Encounter (Signed)
Pt referred by Dr Aundra Dubin to PharmD to initiate [id number] therapy for weight loss. I do not see any active prescription insurance on file, [facility]. He does have diagnosis of DM that's well controlled with most recent A1c 6.3% last week. He is only on insulin and Jardiance for his HF, reports recently changing to [place] which really improved his A1c control but he's been having a few lows. [facility], typically for branded med coverage [facility] MD needs to approve external request and prescribe themselves. Looks like most recent note was already faxed over after Vascepa start was recommended too. Pt aware to ask his MD at the [facility] about prescribing Vascepa and Ozempic for him. ?

## 2021-03-15 ENCOUNTER — Other Ambulatory Visit (HOSPITAL_COMMUNITY): Payer: Self-pay

## 2021-03-15 NOTE — Progress Notes (Signed)
Remote ICD transmission.   

## 2021-03-16 ENCOUNTER — Telehealth: Payer: Self-pay

## 2021-03-16 NOTE — Telephone Encounter (Signed)
Notes scanned to referral 

## 2021-04-01 ENCOUNTER — Ambulatory Visit: Payer: No Typology Code available for payment source | Admitting: Physician Assistant

## 2021-05-14 ENCOUNTER — Encounter (HOSPITAL_COMMUNITY)
Admission: RE | Admit: 2021-05-14 | Discharge: 2021-05-14 | Disposition: A | Discharge status: Home / Self Care | Payer: No Typology Code available for payment source | Source: Ambulatory Visit | Attending: Family Medicine | Admitting: Family Medicine

## 2021-05-14 ENCOUNTER — Encounter (HOSPITAL_COMMUNITY): Payer: Self-pay

## 2021-05-14 VITALS — BP 112/60 | Ht 72.0 in | Wt 307.1 lb

## 2021-05-14 DIAGNOSIS — I213 ST elevation (STEMI) myocardial infarction of unspecified site: Secondary | ICD-10-CM | POA: Diagnosis present

## 2021-05-14 DIAGNOSIS — I2510 Atherosclerotic heart disease of native coronary artery without angina pectoris: Secondary | ICD-10-CM | POA: Diagnosis present

## 2021-05-14 NOTE — Progress Notes (Signed)
Cardiac Individual Treatment Plan ? ?Patient Details  ?Name: [name] ?MRN: [id number] ?Date of Birth: 1955/04/26 ?Referring Provider:   ?Flowsheet Row CARDIAC REHAB PHASE II ORIENTATION from 05/14/2021 in [place]  ?Referring Provider Dr. Alferd Patee  ? ?  ? ? ?Initial Encounter Date:  ?Flowsheet Row CARDIAC REHAB PHASE II ORIENTATION from 05/14/2021 in [place]  ?Date 05/14/21  ? ?  ? ? ?Visit Diagnosis: Atherosclerosis of native coronary artery of native heart without angina pectoris ? ?ST elevation myocardial infarction (STEMI), unspecified artery ([place]) ? ?Patient's Home Medications on Admission: ? ?Current Outpatient Medications:  ?  aspirin EC 81 MG tablet, Take 81 mg by mouth daily. Swallow whole., Disp: , Rfl:  ?  atorvastatin (LIPITOR) 80 MG tablet, Take 80 mg by mouth at bedtime., Disp: , Rfl:  ?  carboxymethylcellulose (REFRESH PLUS) 0.5 % SOLN, Place 1 drop into both eyes 3 (three) times daily as needed (dry eyes)., Disp: , Rfl:  ?  carvedilol (COREG) 6.25 MG tablet, Take 1 tablet (6.25 mg total) by mouth 2 (two) times daily with a meal., Disp: 180 tablet, Rfl: 3 ?  clopidogrel (PLAVIX) 75 MG tablet, Take 1 tablet (75 mg total) by mouth daily., Disp: 14 tablet, Rfl: 0 ?  cyclobenzaprine (FLEXERIL) 5 MG tablet, Take 1 tablet (5 mg total) by mouth 3 (three) times daily. (Patient taking differently: Take 5 mg by mouth 3 (three) times daily as needed for muscle spasms.), Disp: 42 tablet, Rfl: 0 ?  diclofenac sodium (VOLTAREN) 1 % GEL, Apply 4 g topically 3 (three) times daily as needed (pain)., Disp: 350 g, Rfl: 0 ?  docusate sodium (COLACE) 100 MG capsule, Take 1 capsule (100 mg total) by mouth 2 (two) times daily., Disp: 28 capsule, Rfl: 0 ?  empagliflozin (JARDIANCE) 25 MG TABS tablet, Take 12.5 mg by mouth daily., Disp: , Rfl:  ?  famotidine (PEPCID) 40 MG tablet, Take 40 mg by mouth daily as needed for heartburn or indigestion., Disp: , Rfl:  ?  furosemide  (LASIX) 20 MG tablet, Take 1 tablet (20 mg total) by mouth daily as needed for fluid or edema., Disp: 30 tablet, Rfl: 2 ?  gabapentin (NEURONTIN) 400 MG capsule, Take 400-800 mg by mouth See admin instructions. Takes '400mg'$  three times a day as needed for nerve pain. Take '800mg'$  every night at bedtime., Disp: , Rfl:  ?  icosapent Ethyl (VASCEPA) 1 g capsule, Take 2 capsules (2 g total) by mouth 2 (two) times daily., Disp: 120 capsule, Rfl: 11 ?  insulin aspart (NOVOLOG) 100 UNIT/ML injection, Inject 35-50 Units into the skin 3 (three) times daily before meals. 35 units in AM, 35 units at [place], and 50 units at supper, Disp: , Rfl:  ?  insulin glargine (LANTUS) 100 UNIT/ML injection, Inject 80 Units into the skin at bedtime., Disp: , Rfl:  ?  loratadine (CLARITIN) 10 MG tablet, Take 10 mg by mouth daily., Disp: , Rfl:  ?  Multiple Vitamins-Minerals (MULTIVITAMIN WITH MINERALS) tablet, Take 1 tablet by mouth daily., Disp: , Rfl:  ?  nitroGLYCERIN (NITROSTAT) 0.4 MG SL tablet, Place 1 tablet (0.4 mg total) under the tongue every 5 (five) minutes x 3 doses as needed for chest pain. Call MD if you need two or more doses, Disp: 30 tablet, Rfl: 12 ?  pantoprazole (PROTONIX) 40 MG tablet, Take 40 mg by mouth 2 (two) times daily before a meal., Disp: , Rfl:  ?  Polyethyl Glycol-Propyl  Glycol (SYSTANE) 0.4-0.3 % GEL ophthalmic gel, Place 1 application. into both eyes every 6 (six) hours as needed (dry eyes)., Disp: , Rfl:  ?  sacubitril-valsartan (ENTRESTO) 24-26 MG, Take 1 tablet by mouth 2 (two) times daily., Disp: 60 tablet, Rfl: 6 ?  Semaglutide (OZEMPIC, 0.25 OR 0.5 MG/DOSE, [place]), Inject into the skin., Disp: , Rfl:  ? ?Past Medical History: ?Past Medical History:  ?Diagnosis Date  ? Adenomatous polyp   ? Cataract   ? left eye for sure   ? Chronic pain   ? Diabetes mellitus   ? GERD (gastroesophageal reflux disease)   ? History of colonic polyps   ? History of hip replacement, total   ? Hx of anaphylactic shock   ?  Hyperlipidemia   ? Hypertension   ? Left anterior hemiblock   ? Obesity   ? RBBB (right bundle branch block with left anterior fascicular block)   ? 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 ? ?Tobacco Use: ?Social History  ? ?Tobacco Use  ?Smoking Status Former  ? Packs/day: 1.50  ? Years: 42.00  ? Pack years: 63.00  ? Types: Cigarettes  ? Quit date: 09/03/2013  ? Years since quitting: 7.6  ?Smokeless Tobacco Former  ? Types: Chew  ?Tobacco Comments  ? We discussed the need to remain smoke free and to call me for help if he has the desire to smoke again.  ? ? ?Labs: ?Review Flowsheet   ? ?  ?  Latest Ref Rng & Units 05/23/2020 05/24/2020 05/25/2020 05/26/2020  ?Labs for ITP Cardiac and Pulmonary Rehab  ?Cholestrol 0 - 200 mg/dL      ?LDL (calc) 0 - 99 mg/dL      ?HDL-C >40 mg/dL      ?Trlycerides <150 mg/dL      ?Hemoglobin A1c 4.8 - 5.6 %      ?PH, Arterial 7.350 - 7.450 7.600    ? 7.577   7.559      ?PCO2 arterial 32.0 - 48.0 mmHg 33.1    ? 33.2   32.3      ?Bicarbonate 20.0 - 28.0 mmol/L 32.5    ? 30.9   28.7      ?TCO2 22 - 32 mmol/L 34    ? 32   30      ?O2 Saturation % 99.0    ? 63.9    ? 96.0   62.9    ? 97.0   57.4   74.8    ? ?  02/26/2021  ?Labs for ITP Cardiac and Pulmonary Rehab  ?Cholestrol 156    ?LDL (calc) 54    ?HDL-C 31    ?Trlycerides 357    ?Hemoglobin A1c 6.3    ?PH, Arterial   ?PCO2 arterial   ?Bicarbonate   ?TCO2   ?O2 Saturation   ?  ? ? Multiple values from one day are sorted in reverse-chronological order  ?  ?  ? ? ?Capillary Blood Glucose: ?Lab Results  ?Component Value Date  ? GLUCAP 115 (H) 06/10/2020  ? GLUCAP 290 (H) 06/09/2020  ? GLUCAP 217 (H) 06/09/2020  ? GLUCAP 159 (H) 06/09/2020  ? GLUCAP 142 (H) 06/09/2020  ? ? ? ?Exercise Target Goals: ?Exercise Program Goal: ?Individual exercise prescription set using results from initial 6 min walk test and THRR while considering  patient?s activity barriers and safety.  ? ?Exercise Prescription Goal: ?Starting with aerobic activity 30  plus minutes a  day, 3 days per week for initial exercise prescription. Provide home exercise prescription and guidelines that participant acknowledges understanding prior to discharge. ? ?Activity Barriers & Risk Stratification: ? Activity Barriers & Cardiac Risk Stratification - 05/14/21 1235   ? ?  ? Activity Barriers & Cardiac Risk Stratification  ? Activity Barriers Left Hip Replacement;Right Hip Replacement;Left Knee Replacement;Joint Problems;Arthritis;Neck/Spine Problems;History of Falls   ? Cardiac Risk Stratification High   ? ?  ?  ? ?  ? ? ?6 Minute Walk: ? 6 Minute Walk   ? ? [place] Name 05/14/21 1402  ?  ?  ?  ? 6 Minute Walk  ? Phase Initial    ? Distance 1200 feet    ? Walk Time 6 minutes    ? # of Rest Breaks 0    ? MPH 2.27    ? METS 2.18    ? RPE 12    ? VO2 Peak 7.64    ? Symptoms Yes (comment)    ? Comments L hip pain due to a recent fall and R knee pain    ? Resting HR 68 bpm    ? Resting BP 112/60    ? Resting Oxygen Saturation  94 %    ? Exercise Oxygen Saturation  during 6 min walk 98 %    ? Max Ex. HR 109 bpm    ? Max Ex. BP 140/70    ? 2 Minute Post BP 130/66    ? ?  ?  ? ?  ? ? ?Oxygen Initial Assessment: ? ? ?Oxygen Re-Evaluation: ? ? ?Oxygen Discharge (Final Oxygen Re-Evaluation): ? ? ?Initial Exercise Prescription: ? Initial Exercise Prescription - 05/14/21 1400   ? ?  ? Date of Initial Exercise RX and Referring Provider  ? Date 05/14/21   ? Referring Provider Dr. Alferd Patee   ? Expected Discharge Date 08/04/21   ?  ? NuStep  ? Level 1   ? SPM 60   ? Minutes 22   ?  ? Arm Ergometer  ? Level 1   ? RPM 50   ? Minutes 17   ?  ? Prescription Details  ? Frequency (times per week) 3   ? Duration Progress to 30 minutes of continuous aerobic without signs/symptoms of physical distress   ?  ? Intensity  ? THRR 40-80% of Max Heartrate 61-122   ? Ratings of Perceived Exertion 11-13   ?  ? Resistance Training  ? Training Prescription Yes   ? Weight 3   ? Reps 10-15   ? ?  ?  ? ?  ? ? ?Perform Capillary  Blood Glucose checks as needed. ? ?Exercise Prescription Changes: ? ? ?Exercise Comments: ? ? ?Exercise Goals and Review: ? ? Exercise Goals   ? ? [place] Name 05/14/21 1406  ?  ?  ?  ?  ?  ? Exercise Goals  ? Incr

## 2021-05-17 ENCOUNTER — Encounter (HOSPITAL_COMMUNITY)
Admission: RE | Admit: 2021-05-17 | Discharge: 2021-05-17 | Disposition: A | Discharge status: Home / Self Care | Payer: No Typology Code available for payment source | Source: Ambulatory Visit | Attending: Family Medicine | Admitting: Family Medicine

## 2021-05-17 DIAGNOSIS — I213 ST elevation (STEMI) myocardial infarction of unspecified site: Secondary | ICD-10-CM

## 2021-05-17 DIAGNOSIS — I2510 Atherosclerotic heart disease of native coronary artery without angina pectoris: Secondary | ICD-10-CM

## 2021-05-17 NOTE — Progress Notes (Signed)
Daily Session Note ? ?Patient Details  ?Name: [name] ?MRN: [id number] ?Date of Birth: 1955/07/07 ?Referring Provider:   ?Flowsheet Row CARDIAC REHAB PHASE II ORIENTATION from 05/14/2021 in [place]  ?Referring Provider Dr. Alferd Patee  ? ?  ? ? ?Encounter Date: 05/17/2021 ? ?Check In: ? Session Check In - 05/17/21 1044   ? ?  ? Check-In  ? Supervising physician immediately available to respond to emergencies [facility] MD immediately available   ? Physician(s) Dr Harl Bowie   ? Location [facility]-Cardiac & Pulmonary Rehab   ? Staff Present Redge Gainer, BS, Exercise Physiologist;Devone Tousley Hassell Done, RN, Jennye Moccasin, RN, Bjorn Loser, MS, ACSM-CEP, Exercise Physiologist   ? Virtual Visit No   ? Medication changes reported     No   ? Fall or balance concerns reported    Yes   ? Comments Patient has a history of falls and impaired balance.   ? Tobacco Cessation No Change   ? Warm-up and Cool-down Performed as group-led instruction   ? Resistance Training Performed Yes   ? VAD Patient? No   ? PAD/SET Patient? No   ?  ? Pain Assessment  ? Currently in Pain? No/denies   ? Pain Score 0-No pain   ? Multiple Pain Sites No   ? ?  ?  ? ?  ? ? ?Capillary Blood Glucose: ?No results found for this or any previous visit (from the past 24 hour(s)). ? ? ? ?Social History  ? ?Tobacco Use  ?Smoking Status Former  ? Packs/day: 1.50  ? Years: 42.00  ? Pack years: 63.00  ? Types: Cigarettes  ? Quit date: 09/03/2013  ? Years since quitting: 7.7  ?Smokeless Tobacco Former  ? Types: Chew  ?Tobacco Comments  ? We discussed the need to remain smoke free and to call me for help if he has the desire to smoke again.  ? ? ?Goals Met:  ?Independence with exercise equipment ?Exercise tolerated well ?No report of concerns or symptoms today ?Strength training completed today ? ?Goals Unmet:  ?Not Applicable ? ?Comments: Checkout at 1200. ? ? ?Dr. Carlyle Dolly is Medical Director for [facility] ?

## 2021-05-19 ENCOUNTER — Encounter (HOSPITAL_COMMUNITY)
Admission: RE | Admit: 2021-05-19 | Discharge: 2021-05-19 | Disposition: A | Discharge status: Home / Self Care | Payer: No Typology Code available for payment source | Source: Ambulatory Visit | Attending: Family Medicine | Admitting: Family Medicine

## 2021-05-19 DIAGNOSIS — I213 ST elevation (STEMI) myocardial infarction of unspecified site: Secondary | ICD-10-CM

## 2021-05-19 DIAGNOSIS — I2510 Atherosclerotic heart disease of native coronary artery without angina pectoris: Secondary | ICD-10-CM

## 2021-05-19 NOTE — Progress Notes (Signed)
Daily Session Note ? ?Patient Details  ?Name: [name] ?MRN: [id number] ?Date of Birth: 08-Aug-1955 ?Referring Provider:   ?Flowsheet Row CARDIAC REHAB PHASE II ORIENTATION from 05/14/2021 in [place]  ?Referring Provider Dr. Alferd Patee  ? ?  ? ? ?Encounter Date: 05/19/2021 ? ?Check In: ? Session Check In - 05/19/21 1100   ? ?  ? Check-In  ? Supervising physician immediately available to respond to emergencies [facility] MD immediately available   ? Physician(s) Dr Harl Bowie   ? Location [facility]-Cardiac & Pulmonary Rehab   ? Staff Present Redge Gainer, BS, Exercise Physiologist;Daphyne Hassell Done, RN, Jennye Moccasin, RN, BSN;Farooq Petrovich, RN   ? Virtual Visit No   ? Medication changes reported     No   ? Fall or balance concerns reported    Yes   ? Comments Patient has a history of falls and impaired balance.   ? Tobacco Cessation No Change   ? Warm-up and Cool-down Performed as group-led instruction   ? Resistance Training Performed Yes   ? VAD Patient? No   ? PAD/SET Patient? No   ?  ? Pain Assessment  ? Currently in Pain? No/denies   ? Pain Score 0-No pain   ? Multiple Pain Sites No   ? ?  ?  ? ?  ? ? ?Capillary Blood Glucose: ?No results found for this or any previous visit (from the past 24 hour(s)). ? ? ? ?Social History  ? ?Tobacco Use  ?Smoking Status Former  ? Packs/day: 1.50  ? Years: 42.00  ? Pack years: 63.00  ? Types: Cigarettes  ? Quit date: 09/03/2013  ? Years since quitting: 7.7  ?Smokeless Tobacco Former  ? Types: Chew  ?Tobacco Comments  ? We discussed the need to remain smoke free and to call me for help if he has the desire to smoke again.  ? ? ?Goals Met:  ?Independence with exercise equipment ?Exercise tolerated well ?No report of concerns or symptoms today ?Strength training completed today ? ?Goals Unmet:  ?Not Applicable ? ?Comments: check out @ 12:00pm ? ? ?Dr. Carlyle Dolly is Medical Director for [facility] ?

## 2021-05-21 ENCOUNTER — Encounter (HOSPITAL_COMMUNITY)
Admission: RE | Admit: 2021-05-21 | Discharge: 2021-05-21 | Disposition: A | Discharge status: Home / Self Care | Payer: No Typology Code available for payment source | Source: Ambulatory Visit | Attending: Family Medicine | Admitting: Family Medicine

## 2021-05-21 DIAGNOSIS — I2510 Atherosclerotic heart disease of native coronary artery without angina pectoris: Secondary | ICD-10-CM | POA: Diagnosis not present

## 2021-05-21 DIAGNOSIS — I213 ST elevation (STEMI) myocardial infarction of unspecified site: Secondary | ICD-10-CM

## 2021-05-21 NOTE — Progress Notes (Signed)
Daily Session Note  Patient Details  Name: [name] MRN: [id number] Date of Birth: 09-May-1955 Referring Provider:   Flowsheet Row CARDIAC REHAB PHASE II ORIENTATION from 05/14/2021 in [place]  Referring Provider Dr. Alferd Patee       Encounter Date: 05/21/2021  Check In:  Session Check In - 05/21/21 1059       Check-In   Supervising physician immediately available to respond to emergencies [facility] MD immediately available    Physician(s) Dr Vanita Panda    Location [facility]-Cardiac & Pulmonary Rehab    Staff Present Redge Gainer, BS, Exercise Physiologist;Schneur Crowson Hassell Done, RN, BSN;Phyllis Billingsley, RN    Virtual Visit No    Medication changes reported     No    Fall or balance concerns reported    Yes    Comments Patient has a history of falls and impaired balance.    Tobacco Cessation No Change    Warm-up and Cool-down Performed as group-led instruction    Resistance Training Performed Yes    VAD Patient? No    PAD/SET Patient? No      Pain Assessment   Currently in Pain? No/denies    Pain Score 0-No pain    Multiple Pain Sites No             Capillary Blood Glucose: No results found for this or any previous visit (from the past 24 hour(s)).    Social History   Tobacco Use  Smoking Status Former   Packs/day: 1.50   Years: 42.00   Pack years: 63.00   Types: Cigarettes   Quit date: 09/03/2013   Years since quitting: 7.7  Smokeless Tobacco Former   Types: Chew  Tobacco Comments   We discussed the need to remain smoke free and to call me for help if he has the desire to smoke again.    Goals Met:  Independence with exercise equipment Exercise tolerated well No report of concerns or symptoms today Strength training completed today  Goals Unmet:  Not Applicable  Comments: check out 1200.   Dr. Carlyle Dolly is Medical Director for [facility] Cardiac Rehab

## 2021-05-24 ENCOUNTER — Encounter (HOSPITAL_COMMUNITY)
Admission: RE | Admit: 2021-05-24 | Discharge: 2021-05-24 | Disposition: A | Discharge status: Home / Self Care | Payer: No Typology Code available for payment source | Source: Ambulatory Visit | Attending: Family Medicine | Admitting: Family Medicine

## 2021-05-24 VITALS — Wt 307.3 lb

## 2021-05-24 DIAGNOSIS — I2510 Atherosclerotic heart disease of native coronary artery without angina pectoris: Secondary | ICD-10-CM | POA: Diagnosis not present

## 2021-05-24 DIAGNOSIS — I213 ST elevation (STEMI) myocardial infarction of unspecified site: Secondary | ICD-10-CM

## 2021-05-24 NOTE — Progress Notes (Signed)
Daily Session Note  Patient Details  Name: [name] MRN: [id number] Date of Birth: 08/05/1955 Referring Provider:   Flowsheet Row CARDIAC REHAB PHASE II ORIENTATION from 05/14/2021 in [facility]  Referring Provider Dr. Salman       Encounter Date: 05/24/2021  Check In:  Session Check In - 05/24/21 1056       Check-In   Supervising physician immediately available to respond to emergencies [facility] MD immediately available    Physician(s) Dr Schumann    Location [facility]    Staff Present Heather Jachimiak, BS, Exercise Physiologist;Daphyne Martin, RN, BSN;Phyllis Billingsley, RN;Debra Johnson, RN, BSN    Virtual Visit No    Medication changes reported     No    Fall or balance concerns reported    Yes    Comments Patient has a history of falls and impaired balance.    Tobacco Cessation No Change    Warm-up and Cool-down Performed as group-led instruction    Resistance Training Performed Yes    VAD Patient? No    PAD/SET Patient? No      Pain Assessment   Currently in Pain? No/denies    Pain Score 0-No pain    Multiple Pain Sites No             Capillary Blood Glucose: No results found for this or any previous visit (from the past 24 hour(s)).    Social History   Tobacco Use  Smoking Status Former   Packs/day: 1.50   Years: 42.00   Pack years: 63.00   Types: Cigarettes   Quit date: 09/03/2013   Years since quitting: 7.7  Smokeless Tobacco Former   Types: Chew  Tobacco Comments   We discussed the need to remain smoke free and to call me for help if he has the desire to smoke again.    Goals Met:  Independence with exercise equipment Exercise tolerated well No report of concerns or symptoms today Strength training completed today  Goals Unmet:  Not Applicable  Comments: Check out 1200.   Dr. Jonathan Branch is Medical Director for [facility] 

## 2021-05-26 ENCOUNTER — Encounter (HOSPITAL_COMMUNITY)
Admission: RE | Admit: 2021-05-26 | Discharge: 2021-05-26 | Disposition: A | Discharge status: Home / Self Care | Payer: No Typology Code available for payment source | Source: Ambulatory Visit | Attending: Family Medicine | Admitting: Family Medicine

## 2021-05-26 DIAGNOSIS — I213 ST elevation (STEMI) myocardial infarction of unspecified site: Secondary | ICD-10-CM

## 2021-05-26 DIAGNOSIS — I2510 Atherosclerotic heart disease of native coronary artery without angina pectoris: Secondary | ICD-10-CM

## 2021-05-26 NOTE — Progress Notes (Signed)
Cardiac Individual Treatment Plan  Patient Details  Name: [name] MRN: [id number] Date of Birth: March 05, 1955 Referring Provider:   Flowsheet Row CARDIAC REHAB PHASE II ORIENTATION from 05/14/2021 in [place]  Referring Provider Dr. Alferd Patee       Initial Encounter Date:  Flowsheet Row CARDIAC REHAB PHASE II ORIENTATION from 05/14/2021 in [place]  Date 05/14/21       Visit Diagnosis: Atherosclerosis of native coronary artery of native heart without angina pectoris  ST elevation myocardial infarction (STEMI), unspecified artery ([place])  Patient's Home Medications on Admission:  Current Outpatient Medications:    aspirin EC 81 MG tablet, Take 81 mg by mouth daily. Swallow whole., Disp: , Rfl:    atorvastatin (LIPITOR) 80 MG tablet, Take 80 mg by mouth at bedtime., Disp: , Rfl:    carboxymethylcellulose (REFRESH PLUS) 0.5 % SOLN, Place 1 drop into both eyes 3 (three) times daily as needed (dry eyes)., Disp: , Rfl:    carvedilol (COREG) 6.25 MG tablet, Take 1 tablet (6.25 mg total) by mouth 2 (two) times daily with a meal., Disp: 180 tablet, Rfl: 3   clopidogrel (PLAVIX) 75 MG tablet, Take 1 tablet (75 mg total) by mouth daily., Disp: 14 tablet, Rfl: 0   cyclobenzaprine (FLEXERIL) 5 MG tablet, Take 1 tablet (5 mg total) by mouth 3 (three) times daily. (Patient taking differently: Take 5 mg by mouth 3 (three) times daily as needed for muscle spasms.), Disp: 42 tablet, Rfl: 0   diclofenac sodium (VOLTAREN) 1 % GEL, Apply 4 g topically 3 (three) times daily as needed (pain)., Disp: 350 g, Rfl: 0   docusate sodium (COLACE) 100 MG capsule, Take 1 capsule (100 mg total) by mouth 2 (two) times daily., Disp: 28 capsule, Rfl: 0   empagliflozin (JARDIANCE) 25 MG TABS tablet, Take 12.5 mg by mouth daily., Disp: , Rfl:    famotidine (PEPCID) 40 MG tablet, Take 40 mg by mouth daily as needed for heartburn or indigestion., Disp: , Rfl:    furosemide  (LASIX) 20 MG tablet, Take 1 tablet (20 mg total) by mouth daily as needed for fluid or edema., Disp: 30 tablet, Rfl: 2   gabapentin (NEURONTIN) 400 MG capsule, Take 400-800 mg by mouth See admin instructions. Takes '400mg'$  three times a day as needed for nerve pain. Take '800mg'$  every night at bedtime., Disp: , Rfl:    icosapent Ethyl (VASCEPA) 1 g capsule, Take 2 capsules (2 g total) by mouth 2 (two) times daily., Disp: 120 capsule, Rfl: 11   insulin aspart (NOVOLOG) 100 UNIT/ML injection, Inject 35-50 Units into the skin 3 (three) times daily before meals. 35 units in AM, 35 units at [place], and 50 units at supper, Disp: , Rfl:    insulin glargine (LANTUS) 100 UNIT/ML injection, Inject 80 Units into the skin at bedtime., Disp: , Rfl:    loratadine (CLARITIN) 10 MG tablet, Take 10 mg by mouth daily., Disp: , Rfl:    Multiple Vitamins-Minerals (MULTIVITAMIN WITH MINERALS) tablet, Take 1 tablet by mouth daily., Disp: , Rfl:    nitroGLYCERIN (NITROSTAT) 0.4 MG SL tablet, Place 1 tablet (0.4 mg total) under the tongue every 5 (five) minutes x 3 doses as needed for chest pain. Call MD if you need two or more doses, Disp: 30 tablet, Rfl: 12   pantoprazole (PROTONIX) 40 MG tablet, Take 40 mg by mouth 2 (two) times daily before a meal., Disp: , Rfl:    Polyethyl Glycol-Propyl  Glycol (SYSTANE) 0.4-0.3 % GEL ophthalmic gel, Place 1 application. into both eyes every 6 (six) hours as needed (dry eyes)., Disp: , Rfl:    sacubitril-valsartan (ENTRESTO) 24-26 MG, Take 1 tablet by mouth 2 (two) times daily., Disp: 60 tablet, Rfl: 6   Semaglutide (OZEMPIC, 0.25 OR 0.5 MG/DOSE, [place]), Inject into the skin., Disp: , Rfl:   Past Medical History: Past Medical History:  Diagnosis Date   Adenomatous polyp    Cataract    left eye for sure    Chronic pain    Diabetes mellitus    GERD (gastroesophageal reflux disease)    History of colonic polyps    History of hip replacement, total    Hx of anaphylactic shock     Hyperlipidemia    Hypertension    Left anterior hemiblock    Obesity    RBBB (right bundle branch block with left anterior fascicular block)    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    Labs: Review Flowsheet        Latest Ref Rng & Units 05/23/2020 05/24/2020 05/25/2020 05/26/2020  Labs for ITP Cardiac and Pulmonary Rehab  Cholestrol 0 - 200 mg/dL      LDL (calc) 0 - 99 mg/dL      HDL-C >40 mg/dL      Trlycerides <150 mg/dL      Hemoglobin A1c 4.8 - 5.6 %      PH, Arterial 7.350 - 7.450 7.600     7.577   7.559      PCO2 arterial 32.0 - 48.0 mmHg 33.1     33.2   32.3      Bicarbonate 20.0 - 28.0 mmol/L 32.5     30.9   28.7      TCO2 22 - 32 mmol/L 34     32   30      O2 Saturation % 99.0     63.9     96.0   62.9     97.0   57.4   74.8       02/26/2021  Labs for ITP Cardiac and Pulmonary Rehab  Cholestrol 156    LDL (calc) 54    HDL-C 31    Trlycerides 357    Hemoglobin A1c 6.3    PH, Arterial   PCO2 arterial   Bicarbonate   TCO2   O2 Saturation       Multiple values from one day are sorted in reverse-chronological order        Capillary Blood Glucose: Lab Results  Component Value Date   GLUCAP 115 (H) 06/10/2020   GLUCAP 290 (H) 06/09/2020   GLUCAP 217 (H) 06/09/2020   GLUCAP 159 (H) 06/09/2020   GLUCAP 142 (H) 06/09/2020     Exercise Target Goals: Exercise Program Goal: Individual exercise prescription set using results from initial 6 min walk test and THRR while considering  patient's activity barriers and safety.   Exercise Prescription Goal: Starting with aerobic activity 30  plus minutes a  day, 3 days per week for initial exercise prescription. Provide home exercise prescription and guidelines that participant acknowledges understanding prior to discharge.  Activity Barriers & Risk Stratification:  Activity Barriers & Cardiac Risk Stratification - 05/14/21 1235       Activity Barriers & Cardiac Risk Stratification   Activity Barriers Left Hip Replacement;Right Hip Replacement;Left Knee Replacement;Joint Problems;Arthritis;Neck/Spine Problems;History of Falls    Cardiac Risk Stratification High             6 Minute Walk:  6 Minute Walk     Row Name 05/14/21 1402         6 Minute Walk   Phase Initial     Distance 1200 feet     Walk Time 6 minutes     # of Rest Breaks 0     MPH 2.27     METS 2.18     RPE 12     VO2 Peak 7.64     Symptoms Yes (comment)     Comments L hip pain due to a recent fall and R knee pain     Resting HR 68 bpm     Resting BP 112/60     Resting Oxygen Saturation  94 %     Exercise Oxygen Saturation  during 6 min walk 98 %     Max Ex. HR 109 bpm     Max Ex. BP 140/70     2 Minute Post BP 130/66              Oxygen Initial Assessment:   Oxygen Re-Evaluation:   Oxygen Discharge (Final Oxygen Re-Evaluation):   Initial Exercise Prescription:  Initial Exercise Prescription - 05/14/21 1400       Date of Initial Exercise RX and Referring Provider   Date 05/14/21    Referring Provider Dr. Alferd Patee    Expected Discharge Date 08/04/21      NuStep   Level 1    SPM 60    Minutes 22      Arm Ergometer   Level 1    RPM 50    Minutes 17      Prescription Details   Frequency (times per week) 3    Duration Progress to 30 minutes of continuous aerobic without signs/symptoms of physical distress      Intensity   THRR 40-80% of Max Heartrate 61-122    Ratings of Perceived Exertion 11-13      Resistance Training   Training Prescription Yes    Weight 3    Reps 10-15             Perform Capillary  Blood Glucose checks as needed.  Exercise Prescription Changes:   Exercise Prescription Changes     Row Name 05/24/21 1400             Response to Exercise   Blood Pressure (Admit) 136/60       Blood Pressure (Exercise) 124/64       Blood Pressure (Exit) 130/60       Heart Rate (Admit) 85 bpm       Heart Rate (Exercise) 115 bpm       Heart Rate (Exit) 94 bpm       Rating of Perceived Exertion (Exercise) 11       Duration Continue with 30 min of aerobic exercise without signs/symptoms of physical distress.       Intensity THRR unchanged         Progression  Progression Continue to progress workloads to maintain intensity without signs/symptoms of physical distress.         Resistance Training   Training Prescription Yes       Weight 5       Reps 10-15       Time 10 Minutes         NuStep   Level 1       SPM 104       Minutes 22       METs 2.13         Arm Ergometer   Level 1       RPM 54       Minutes 17       METs 1.49                Exercise Comments:   Exercise Goals and Review:   Exercise Goals     Row Name 05/14/21 1406 05/24/21 1428           Exercise Goals   Increase Physical Activity Yes Yes      Intervention Provide advice, education, support and counseling about physical activity/exercise needs.;Develop an individualized exercise prescription for aerobic and resistive training based on initial evaluation findings, risk stratification, comorbidities and participant's personal goals. Provide advice, education, support and counseling about physical activity/exercise needs.;Develop an individualized exercise prescription for aerobic and resistive training based on initial evaluation findings, risk stratification, comorbidities and participant's personal goals.      Expected Outcomes Short Term: Attend rehab on a regular basis to increase amount of physical activity.;Long Term: Add in home exercise to make exercise part of routine and to increase  amount of physical activity.;Long Term: Exercising regularly at least 3-5 days a week. Short Term: Attend rehab on a regular basis to increase amount of physical activity.;Long Term: Add in home exercise to make exercise part of routine and to increase amount of physical activity.;Long Term: Exercising regularly at least 3-5 days a week.      Increase Strength and Stamina Yes Yes      Intervention Provide advice, education, support and counseling about physical activity/exercise needs.;Develop an individualized exercise prescription for aerobic and resistive training based on initial evaluation findings, risk stratification, comorbidities and participant's personal goals. Provide advice, education, support and counseling about physical activity/exercise needs.;Develop an individualized exercise prescription for aerobic and resistive training based on initial evaluation findings, risk stratification, comorbidities and participant's personal goals.      Expected Outcomes Short Term: Increase workloads from initial exercise prescription for resistance, speed, and METs.;Short Term: Perform resistance training exercises routinely during rehab and add in resistance training at home;Long Term: Improve cardiorespiratory fitness, muscular endurance and strength as measured by increased METs and functional capacity (6MWT) Short Term: Increase workloads from initial exercise prescription for resistance, speed, and METs.;Short Term: Perform resistance training exercises routinely during rehab and add in resistance training at home;Long Term: Improve cardiorespiratory fitness, muscular endurance and strength as measured by increased METs and functional capacity (6MWT)      Able to understand and use rate of perceived exertion (RPE) scale Yes Yes      Intervention Provide education and explanation on how to use RPE scale Provide education and explanation on how to use RPE scale      Expected Outcomes Short Term: Able to use  RPE daily in rehab to express subjective intensity level;Long Term:  Able to use RPE to guide intensity level when exercising independently Short Term: Able  to use RPE daily in rehab to express subjective intensity level;Long Term:  Able to use RPE to guide intensity level when exercising independently      Knowledge and understanding of Target Heart Rate Range (THRR) Yes Yes      Intervention Provide education and explanation of THRR including how the numbers were predicted and where they are located for reference Provide education and explanation of THRR including how the numbers were predicted and where they are located for reference      Expected Outcomes Short Term: Able to state/look up THRR;Long Term: Able to use THRR to govern intensity when exercising independently;Short Term: Able to use daily as guideline for intensity in rehab Short Term: Able to state/look up THRR;Long Term: Able to use THRR to govern intensity when exercising independently;Short Term: Able to use daily as guideline for intensity in rehab      Able to check pulse independently Yes Yes      Intervention Provide education and demonstration on how to check pulse in carotid and radial arteries.;Review the importance of being able to check your own pulse for safety during independent exercise Provide education and demonstration on how to check pulse in carotid and radial arteries.;Review the importance of being able to check your own pulse for safety during independent exercise      Expected Outcomes Short Term: Able to explain why pulse checking is important during independent exercise;Long Term: Able to check pulse independently and accurately Short Term: Able to explain why pulse checking is important during independent exercise;Long Term: Able to check pulse independently and accurately      Understanding of Exercise Prescription Yes Yes      Intervention Provide education, explanation, and written materials on patient's  individual exercise prescription Provide education, explanation, and written materials on patient's individual exercise prescription      Expected Outcomes Short Term: Able to explain program exercise prescription;Long Term: Able to explain home exercise prescription to exercise independently Short Term: Able to explain program exercise prescription;Long Term: Able to explain home exercise prescription to exercise independently               Exercise Goals Re-Evaluation :  Exercise Goals Re-Evaluation     [place] Name 05/24/21 1429             Exercise Goal Re-Evaluation   Exercise Goals Review Increase Physical Activity;Increase Strength and Stamina;Able to understand and use rate of perceived exertion (RPE) scale;Knowledge and understanding of Target Heart Rate Range (THRR);Able to check pulse independently;Understanding of Exercise Prescription       Comments Pt has completed 5 sessions of cardiac rehab. He is motivated when in class ans eager to progress his workload. He pushed himself the first week of class and went over his THR and was asked to slow down. He stated that level 1 feels to easy but his HR increased when he tried to increase the level. He is currently exercising at 2.13 METs on the stepper. Will continue to monitor and progress as able.       Expected Outcomes Through exercise at rehab and at home, the patient will meet their stated goals.                 Discharge Exercise Prescription (Final Exercise Prescription Changes):  Exercise Prescription Changes - 05/24/21 1400       Response to Exercise   Blood Pressure (Admit) 136/60    Blood Pressure (Exercise) 124/64    Blood Pressure (  Exit) 130/60    Heart Rate (Admit) 85 bpm    Heart Rate (Exercise) 115 bpm    Heart Rate (Exit) 94 bpm    Rating of Perceived Exertion (Exercise) 11    Duration Continue with 30 min of aerobic exercise without signs/symptoms of physical distress.    Intensity THRR unchanged       Progression   Progression Continue to progress workloads to maintain intensity without signs/symptoms of physical distress.      Resistance Training   Training Prescription Yes    Weight 5    Reps 10-15    Time 10 Minutes      NuStep   Level 1    SPM 104    Minutes 22    METs 2.13      Arm Ergometer   Level 1    RPM 54    Minutes 17    METs 1.49             Nutrition:  Target Goals: Understanding of nutrition guidelines, daily intake of sodium '1500mg'$ , cholesterol '200mg'$ , calories 30% from fat and 7% or less from saturated fats, daily to have 5 or more servings of fruits and vegetables.  Biometrics:  Pre Biometrics - 05/14/21 1407       Pre Biometrics   Height 6' (1.829 m)    Weight 139.3 kg    Waist Circumference 54 inches    Hip Circumference 51 inches    Waist to Hip Ratio 1.06 %    BMI (Calculated) 41.64    Triceps Skinfold 25 mm    % Body Fat 40.9 %    Grip Strength 19.4 kg    Flexibility 0 in    Single Leg Stand 8 seconds              Nutrition Therapy Plan and Nutrition Goals:  Nutrition Therapy & Goals - 05/14/21 1332       Personal Nutrition Goals   Comments Patient scored 69 on his diet assessment. Patient says he follows a low fat, low carb diet. Handout provided and explained regarding healthier choices and DM information. Patient verbalized understanding. We offer 2 educational sessions on heart healthy nutrition with handouts and assistance with RD referral if patient is interested.      Intervention Plan   Intervention Nutrition handout(s) given to patient.    Expected Outcomes Short Term Goal: Understand basic principles of dietary content, such as calories, fat, sodium, cholesterol and nutrients.             Nutrition Assessments:  Nutrition Assessments - 05/14/21 1332       MEDFICTS Scores   Pre Score 69            MEDIFICTS Score Key: ?70 Need to make dietary changes  40-70 Heart Healthy Diet ? 40 Therapeutic  Level Cholesterol Diet   Picture Your Plate Scores: <27 Unhealthy dietary pattern with much room for improvement. 41-50 Dietary pattern unlikely to meet recommendations for good health and room for improvement. 51-60 More healthful dietary pattern, with some room for improvement.  >60 Healthy dietary pattern, although there may be some specific behaviors that could be improved.    Nutrition Goals Re-Evaluation:   Nutrition Goals Discharge (Final Nutrition Goals Re-Evaluation):   Psychosocial: Target Goals: Acknowledge presence or absence of significant depression and/or stress, maximize coping skills, provide positive support system. Participant is able to verbalize types and ability to use techniques and skills needed for reducing stress and  depression.  Initial Review & Psychosocial Screening:  Initial Psych Review & Screening - 05/14/21 1338       Initial Review   Current issues with None Identified   Patient has PTSD.     Family Dynamics   Good Support System? Yes      Barriers   Psychosocial barriers to participate in program There are no identifiable barriers or psychosocial needs.      Screening Interventions   Interventions Encouraged to exercise    Expected Outcomes Short Term goal: Utilizing psychosocial counselor, staff and physician to assist with identification of specific Stressors or current issues interfering with healing process. Setting desired goal for each stressor or current issue identified.             Quality of Life Scores:  Quality of Life - 05/14/21 1408       Quality of Life   Select Quality of Life      Quality of Life Scores   Health/Function Pre 15.23 %    Socioeconomic Pre 18.75 %    Psych/Spiritual Pre 16.21 %    Family Pre 25.2 %    GLOBAL Pre 17.59 %            Scores of 19 and below usually indicate a poorer quality of life in these areas.  A difference of  2-3 points is a clinically meaningful difference.  A difference  of 2-3 points in the total score of the Quality of Life Index has been associated with significant improvement in overall quality of life, self-image, physical symptoms, and general health in studies assessing change in quality of life.  PHQ-9: Review Flowsheet        05/14/2021 09/29/2014 08/30/2012  Depression screen PHQ 2/9  Decreased Interest 0 0 0  Down, Depressed, Hopeless 1 0 0  PHQ - 2 Score 1 0 0  Altered sleeping 1    Tired, decreased energy 1    Change in appetite 1    Feeling bad or failure about yourself  1    Trouble concentrating 0    Moving slowly or fidgety/restless 0    Suicidal thoughts 0    PHQ-9 Score 5    Difficult doing work/chores Not difficult at all           Interpretation of Total Score  Total Score Depression Severity:  1-4 = Minimal depression, 5-9 = Mild depression, 10-14 = Moderate depression, 15-19 = Moderately severe depression, 20-27 = Severe depression   Psychosocial Evaluation and Intervention:  Psychosocial Evaluation - 05/14/21 1339       Psychosocial Evaluation & Interventions   Interventions Stress management education;Relaxation education;Encouraged to exercise with the program and follow exercise prescription    Comments Patient has no psychosocial barriers identified at his orientation visit. His initial PHQ-9 socre was 5 based on his not being able to do what he wants to do due to his chronic generalized OA and fear since his STEMI. He is a retired Curator. He had a STEMI 5/22 and went into cardiac arrest at home. His wife started CPR and this was continued for 64 minutes per ED note until ROSC was achieved in the ED at [facility]. He said he went to heaven 3 times. He shared vivid details about what he saw and who he talked to. He served in [employer] in [place] from PTSD. He does not feel like he has anxiety or depression but he does fear that he  could die or go into cardiac arrest again. He says he thinks about it  a lot. He says he is not afraid to die but wants to be here longer to see his grandchildren grow. He lives with his wife of many years and they have 3 children and multiple grandchildren. He names his wife as his support person but says he is very close to all of his family and he really enjoys his grandchildren.  He says he has a bike at home and is a member at the [facility] but wants to do the program to be monitored and learn how much he can and should do and how far he can push himself. He is ready to start.    Expected Outcomes Patient will continue to have no psychosocial barriers or issues identified.    Continue Psychosocial Services  No Follow up required             Psychosocial Re-Evaluation:  Psychosocial Re-Evaluation     [place] Name 05/17/21 1237             Psychosocial Re-Evaluation   Current issues with None Identified  PTSD       Comments Patient is new to the program starting today. He continues to have no psychosocial barriers identified and his PTSD is managed. He seemed to enjoy his first session and demonstrates an interest in improving his health. We will continue to manage.       Expected Outcomes Patient will complete the program meeting both personal and program goals.       Interventions Stress management education;Relaxation education;Encouraged to attend Cardiac Rehabilitation for the exercise       Continue Psychosocial Services  No Follow up required                Psychosocial Discharge (Final Psychosocial Re-Evaluation):  Psychosocial Re-Evaluation - 05/17/21 1237       Psychosocial Re-Evaluation   Current issues with None Identified   PTSD   Comments Patient is new to the program starting today. He continues to have no psychosocial barriers identified and his PTSD is managed. He seemed to enjoy his first session and demonstrates an interest in improving his health. We will continue to manage.    Expected Outcomes Patient will complete the program  meeting both personal and program goals.    Interventions Stress management education;Relaxation education;Encouraged to attend Cardiac Rehabilitation for the exercise    Continue Psychosocial Services  No Follow up required             Vocational Rehabilitation: Provide vocational rehab assistance to qualifying candidates.   Vocational Rehab Evaluation & Intervention:  Vocational Rehab - 05/14/21 1336       Initial Vocational Rehab Evaluation & Intervention   Assessment shows need for Vocational Rehabilitation No      Vocational Rehab Re-Evaulation   Comments Patient is retired and does not need vocational rehab.             Education: Education Goals: Education classes will be provided on a weekly basis, covering required topics. Participant will state understanding/return demonstration of topics presented.  Learning Barriers/Preferences:  Learning Barriers/Preferences - 05/14/21 1334       Learning Barriers/Preferences   Learning Barriers Sight    Learning Preferences Written Material             Education Topics: Hypertension, Hypertension Reduction -Define heart disease and high blood pressure. Discus how high blood pressure affects the body and  ways to reduce high blood pressure.   Exercise and Your Heart -Discuss why it is important to exercise, the FITT principles of exercise, normal and abnormal responses to exercise, and how to exercise safely.   Angina -Discuss definition of angina, causes of angina, treatment of angina, and how to decrease risk of having angina.   Cardiac Medications -Review what the following cardiac medications are used for, how they affect the body, and side effects that may occur when taking the medications.  Medications include Aspirin, Beta blockers, calcium channel blockers, ACE Inhibitors, angiotensin receptor blockers, diuretics, digoxin, and antihyperlipidemics.   Congestive Heart Failure -Discuss the definition of  CHF, how to live with CHF, the signs and symptoms of CHF, and how keep track of weight and sodium intake.   Heart Disease and Intimacy -Discus the effect sexual activity has on the heart, how changes occur during intimacy as we age, and safety during sexual activity.   Smoking Cessation / COPD -Discuss different methods to quit smoking, the health benefits of quitting smoking, and the definition of COPD. Flowsheet Row CARDIAC REHAB PHASE II EXERCISE from 05/19/2021 in [place]  Date 05/19/21  Educator pb  Instruction Review Code 1- Verbalizes Understanding       Nutrition I: Fats -Discuss the types of cholesterol, what cholesterol does to the heart, and how cholesterol levels can be controlled.   Nutrition II: Labels -Discuss the different components of food labels and how to read food label   Heart Parts/Heart Disease and PAD -Discuss the anatomy of the heart, the pathway of blood circulation through the heart, and these are affected by heart disease.   Stress I: Signs and Symptoms -Discuss the causes of stress, how stress may lead to anxiety and depression, and ways to limit stress.   Stress II: Relaxation -Discuss different types of relaxation techniques to limit stress.   Warning Signs of Stroke / TIA -Discuss definition of a stroke, what the signs and symptoms are of a stroke, and how to identify when someone is having stroke.   Knowledge Questionnaire Score:  Knowledge Questionnaire Score - 05/14/21 1335       Knowledge Questionnaire Score   Pre Score 23/24             Core Components/Risk Factors/Patient Goals at Admission:  Personal Goals and Risk Factors at Admission - 05/14/21 1336       Core Components/Risk Factors/Patient Goals on Admission    Weight Management Yes    Intervention Weight Management: Develop a combined nutrition and exercise program designed to reach desired caloric intake, while maintaining appropriate  intake of nutrient and fiber, sodium and fats, and appropriate energy expenditure required for the weight goal.;Weight Management/Obesity: Establish reasonable short term and long term weight goals.    Admit Weight 307 lb (139.3 kg)    Goal Weight: Short Term 302 lb (137 kg)    Goal Weight: Long Term 297 lb (134.7 kg)    Expected Outcomes Weight Maintenance: Understanding of the daily nutrition guidelines, which includes 25-35% calories from fat, 7% or less cal from saturated fats, less than '200mg'$  cholesterol, less than 1.5gm of sodium, & 5 or more servings of fruits and vegetables daily;Weight Loss: Understanding of general recommendations for a balanced deficit meal plan, which promotes 1-2 lb weight loss per week and includes a negative energy balance of [phone] kcal/d;Weight Gain: Understanding of general recommendations for a high calorie, high protein meal plan that promotes weight gain by distributing  calorie intake throughout the day with the consumption for 4-5 meals, snacks, and/or supplements    Diabetes Yes    Intervention Provide education about signs/symptoms and action to take for hypo/hyperglycemia.;Provide education about proper nutrition, including hydration, and aerobic/resistive exercise prescription along with prescribed medications to achieve blood glucose in normal ranges: Fasting glucose 65-99 mg/dL    Expected Outcomes Short Term: Participant verbalizes understanding of the signs/symptoms and immediate care of hyper/hypoglycemia, proper foot care and importance of medication, aerobic/resistive exercise and nutrition plan for blood glucose control.    Personal Goal Other Yes    Personal Goal Patient would like to lose weight; increase his strenght and energy and learn how hard he can push himself and what he is able to do.    Intervention Patient will participate in the CR program with exercise and education 3 days/week and supplement with exercise at home.    Expected Outcomes  Patient will complete the program meeting both personal and program goals.             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 He has multiple risk factors for CAD and is participating in the program for risk modification. He started today and did well. His initial weight was 307. His last A1C on file was 6.3% 02/26/21 down from 8.2% 4 years ago. His DM is managed with insulin and Jardiance. His personal goals for the program are to gain strength and energy; lose weight and learn what he can do and how far he can push himself. We will continue to monitor his progress as he works towards meeting these goals.       Expected Outcomes Patient will complete the program meeting both personal and program goals.                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 He has multiple risk factors for CAD and is participating in the program for risk modification. He started today and did well. His initial weight was 307. His last A1C on file was 6.3% 02/26/21 down from 8.2% 4 years ago. His DM is managed with insulin and Jardiance. His personal goals for the program are to gain strength and energy; lose weight and learn what he can do and how far he can push himself. We will continue to monitor his progress as he works towards meeting these  goals.    Expected Outcomes Patient will complete the program meeting both personal and program goals.             ITP Comments:   Comments: ITP REVIEW Pt is making expected progress toward Cardiac Rehab goals after completing 5 sessions. Recommend continued exercise, life style modification, education, and increased stamina and strength.

## 2021-05-26 NOTE — Progress Notes (Signed)
Daily Session Note  Patient Details  Name: [name] MRN: [id number] Date of Birth: September 22, 1955 Referring Provider:   Flowsheet Row CARDIAC REHAB PHASE II ORIENTATION from 05/14/2021 in [place]  Referring Provider Dr. Alferd Patee       Encounter Date: 05/26/2021  Check In:  Session Check In - 05/26/21 1100       Check-In   Supervising physician immediately available to respond to emergencies [facility] MD immediately available    Physician(s) Dr Domenic Polite    Location [facility]    Staff Present Redge Gainer, BS, Exercise Physiologist;Darwyn Ponzo Hassell Done, RN, Jennye Moccasin, RN, BSN    Virtual Visit No    Medication changes reported     No    Fall or balance concerns reported    Yes    Comments Patient has a history of falls and impaired balance.    Tobacco Cessation No Change    Warm-up and Cool-down Performed as group-led instruction    Resistance Training Performed Yes    VAD Patient? No    PAD/SET Patient? No      Pain Assessment   Currently in Pain? No/denies    Pain Score 0-No pain    Multiple Pain Sites No             Capillary Blood Glucose: No results found for this or any previous visit (from the past 24 hour(s)).    Social History   Tobacco Use  Smoking Status Former   Packs/day: 1.50   Years: 42.00   Pack years: 63.00   Types: Cigarettes   Quit date: 09/03/2013   Years since quitting: 7.7  Smokeless Tobacco Former   Types: Chew  Tobacco Comments   We discussed the need to remain smoke free and to call me for help if he has the desire to smoke again.    Goals Met:  Independence with exercise equipment Exercise tolerated well No report of concerns or symptoms today Strength training completed today  Goals Unmet:  Not Applicable  Comments: checkout at 1200.   Dr. Carlyle Dolly is Medical Director for [facility] Cardiac Rehab

## 2021-05-27 ENCOUNTER — Other Ambulatory Visit (HOSPITAL_COMMUNITY): Payer: Self-pay | Admitting: *Deleted

## 2021-05-27 MED ORDER — ENTRESTO 24-26 MG PO TABS: 1.0000 | ORAL_TABLET | Freq: Two times a day (BID) | ORAL | 6 refills | Status: DC

## 2021-05-28 ENCOUNTER — Encounter (HOSPITAL_COMMUNITY)
Admission: RE | Admit: 2021-05-28 | Discharge: 2021-05-28 | Disposition: A | Discharge status: Home / Self Care | Payer: No Typology Code available for payment source | Source: Ambulatory Visit | Attending: Family Medicine | Admitting: Family Medicine

## 2021-05-28 DIAGNOSIS — I2510 Atherosclerotic heart disease of native coronary artery without angina pectoris: Secondary | ICD-10-CM

## 2021-05-28 DIAGNOSIS — I213 ST elevation (STEMI) myocardial infarction of unspecified site: Secondary | ICD-10-CM

## 2021-05-28 NOTE — Progress Notes (Signed)
Daily Session Note  Patient Details  Name: [name] MRN: [id number] Date of Birth: 1955/07/17 Referring Provider:   Flowsheet Row CARDIAC REHAB PHASE II ORIENTATION from 05/14/2021 in [place]  Referring Provider Dr. Alferd Patee       Encounter Date: 05/28/2021  Check In:  Session Check In - 05/28/21 1100       Check-In   Supervising physician immediately available to respond to emergencies [facility] MD immediately available    Physician(s) Dr Debara Pickett    Location [facility]    Staff Present Redge Gainer, BS, Exercise Physiologist;Ziquan Fidel Hassell Done, RN, BSN;Christy Edwards, RN, BSN    Virtual Visit No    Medication changes reported     No    Fall or balance concerns reported    No    Comments Patient has a history of falls and impaired balance.    Warm-up and Cool-down Performed as group-led Higher education careers adviser Performed Yes    VAD Patient? No    PAD/SET Patient? No      Pain Assessment   Currently in Pain? No/denies    Pain Score 0-No pain    Multiple Pain Sites No             Capillary Blood Glucose: No results found for this or any previous visit (from the past 24 hour(s)).    Social History   Tobacco Use  Smoking Status Former   Packs/day: 1.50   Years: 42.00   Pack years: 63.00   Types: Cigarettes   Quit date: 09/03/2013   Years since quitting: 7.7  Smokeless Tobacco Former   Types: Chew  Tobacco Comments   We discussed the need to remain smoke free and to call me for help if he has the desire to smoke again.    Goals Met:  Independence with exercise equipment Exercise tolerated well No report of concerns or symptoms today Strength training completed today  Goals Unmet:  Not Applicable  Comments: checkout at 1200.   Dr. Carlyle Dolly is Medical Director for [facility] Cardiac Rehab

## 2021-05-31 ENCOUNTER — Encounter (HOSPITAL_COMMUNITY): Payer: No Typology Code available for payment source

## 2021-06-01 ENCOUNTER — Encounter (HOSPITAL_COMMUNITY): Payer: Self-pay | Admitting: Cardiology

## 2021-06-01 ENCOUNTER — Ambulatory Visit (HOSPITAL_COMMUNITY)
Admission: RE | Admit: 2021-06-01 | Discharge: 2021-06-01 | Disposition: A | Discharge status: Home / Self Care | Payer: No Typology Code available for payment source | Source: Ambulatory Visit | Attending: Cardiology | Admitting: Cardiology

## 2021-06-01 VITALS — BP 110/70 | HR 80 | Wt 311.8 lb

## 2021-06-01 DIAGNOSIS — I2510 Atherosclerotic heart disease of native coronary artery without angina pectoris: Secondary | ICD-10-CM | POA: Diagnosis not present

## 2021-06-01 DIAGNOSIS — E669 Obesity, unspecified: Secondary | ICD-10-CM | POA: Insufficient documentation

## 2021-06-01 DIAGNOSIS — I5032 Chronic diastolic (congestive) heart failure: Secondary | ICD-10-CM | POA: Diagnosis present

## 2021-06-01 DIAGNOSIS — E119 Type 2 diabetes mellitus without complications: Secondary | ICD-10-CM | POA: Diagnosis not present

## 2021-06-01 DIAGNOSIS — I5022 Chronic systolic (congestive) heart failure: Secondary | ICD-10-CM | POA: Diagnosis not present

## 2021-06-01 DIAGNOSIS — I255 Ischemic cardiomyopathy: Secondary | ICD-10-CM | POA: Diagnosis not present

## 2021-06-01 DIAGNOSIS — Z7902 Long term (current) use of antithrombotics/antiplatelets: Secondary | ICD-10-CM | POA: Diagnosis not present

## 2021-06-01 DIAGNOSIS — Z6841 Body Mass Index (BMI) 40.0 and over, adult: Secondary | ICD-10-CM | POA: Insufficient documentation

## 2021-06-01 DIAGNOSIS — I469 Cardiac arrest, cause unspecified: Secondary | ICD-10-CM | POA: Insufficient documentation

## 2021-06-01 DIAGNOSIS — E785 Hyperlipidemia, unspecified: Secondary | ICD-10-CM | POA: Insufficient documentation

## 2021-06-01 DIAGNOSIS — I110 Hypertensive heart disease with heart failure: Secondary | ICD-10-CM | POA: Insufficient documentation

## 2021-06-01 LAB — LIPID PANEL
Cholesterol: 154 mg/dL (ref 0–200)
HDL: 31 mg/dL — ABNORMAL LOW (ref >40)
LDL Cholesterol: 71 mg/dL (ref 0–99)
Total CHOL/HDL Ratio: 5 RATIO
Triglycerides: 262 mg/dL — ABNORMAL HIGH (ref <150)
VLDL: 52 mg/dL — ABNORMAL HIGH (ref 0–40)

## 2021-06-01 LAB — BASIC METABOLIC PANEL
Anion gap: 8 (ref 5–15)
BUN: 20 mg/dL (ref 8–23)
CO2: 26 mmol/L (ref 22–32)
Calcium: 8.7 mg/dL — ABNORMAL LOW (ref 8.9–10.3)
Chloride: 106 mmol/L (ref 98–111)
Creatinine, Ser: 1.09 mg/dL (ref 0.61–1.24)
GFR, Estimated: >60 mL/min (ref >60)
Glucose, Bld: 117 mg/dL — ABNORMAL HIGH (ref 70–99)
Potassium: 4.8 mmol/L (ref 3.5–5.1)
Sodium: 140 mmol/L (ref 135–145)

## 2021-06-01 MED ORDER — CLOPIDOGREL BISULFATE 75 MG PO TABS: 75.0000 mg | ORAL_TABLET | Freq: Every day | ORAL | 3 refills | Status: DC

## 2021-06-01 MED ORDER — ENTRESTO 24-26 MG PO TABS: 1.0000 | ORAL_TABLET | Freq: Two times a day (BID) | ORAL | 3 refills | Status: DC

## 2021-06-01 NOTE — Patient Instructions (Signed)
Stop Asprin.  Labs done today, your results will be available in MyChart, we will contact you for abnormal readings.  Your physician recommends that you schedule a follow-up appointment in: 4 months.  If you have any questions or concerns before your next appointment please send [place] a message through [place] or call our office at [phone].    TO LEAVE A MESSAGE FOR THE NURSE SELECT OPTION 2, PLEASE LEAVE A MESSAGE INCLUDING: YOUR NAME DATE OF BIRTH CALL BACK NUMBER REASON FOR CALL**this is important as we prioritize the call backs  YOU WILL RECEIVE A CALL BACK THE SAME DAY AS LONG AS YOU CALL BEFORE 4:00 PM  At the [facility], you and your health needs are our priority. As part of our continuing mission to provide you with exceptional heart care, we have created designated Provider Care Teams. These Care Teams include your primary Cardiologist (physician) and Advanced Practice Providers (APPs- Physician Assistants and Nurse Practitioners) who all work together to provide you with the care you need, when you need it.   You may see any of the following providers on your designated Care Team at your next follow up: Dr Glori Bickers Dr Haynes Kerns, NP Lyda Jester, [place] [facility] [place], [place] Audry Riles, PharmD   Please be sure to bring in all your medications bottles to every appointment.

## 2021-06-01 NOTE — Progress Notes (Signed)
Advanced Heart Failure Clinic Note   PCP: [facility] EP: Dr. Quentin Ore HF Cardiologist: Dr. Aundra Dubin  HPI: 66 y.o. male w/ HTN, DM2, HLD, VF arrest, and ischemic cardiomyopathy.    He suffered witnessed out of hospital cardiac arrest 05/19/20 w/ immediate bystander CPR, initiated by wife, and prompt activation of EMS. Per report, had prolonged CPR, more than 45 min prior to ROSC. Underlying rhythm was VFib. Bedside echo showed EF 40-50% inferobasal severe hypokinesis with septal bounce.  RV systolic function normal. 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 No other disease. LM, LAD and LCx all widely patent. No indication for PCI.  Suspect scar-mediated VF.  He required mechanical ventilation and pressor support, but did not require cardiac mechanical support. 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 He received a Medtronic ICD for secondary prevention. He was discharged to CIR.   Echo in 2/23 showed EF 50-55% with inferior hypokinesis, normal RV, and normal IVC.   Today he returns for HF followup.  Weight is up about 7 lbs. He has been on semaglutide for about 3 wks.  He still has occasional noncardiac chest pain.  No dypsnea walking on flat ground.  Main complaint is hip, knee, and back pain.  He has been doing cardiac rehab. He is using CPAP.   Labs (8/22): K 4.5, creatinine 1.03 Labs (11/22): K 4.3, creatinine 1.04 Labs (2/23): TGs 357, LDL 54, K 4.1, creatinine 1.05  Medtronic device interrogation: no VT/AF, fluid index < threshold, stable thoracic impedance.    PMH: 1. Type 2 diabetes 2. GERD 3. HTN 4. Hyperlipidemia.  5. 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  - Medtronic ICD.  6. CAD: LHC 5/22 showed  chronic occlusion of RCA with L=>R collaterals.  7. Transient complete heart block in 5/22.  8. Ischemic cardiomyopathy: Echo post-arrest in 5/22 showed EF 45%, repeat echo in 5/22 showed EF up to 60-65%.  - Echo (2/23): EF 50-55% with inferior hypokinesis, normal RV, and normal IVC.  9. OSA: Uses CPAP.   ROS: All systems negative except as listed in HPI, PMH and Problem List.  SH:  Social History   Socioeconomic History   Marital status: Married    Spouse name: Not on file   Number of children: Not on file   Years of education: Not on file   Highest education level: Not on file  Occupational History   Not on file  Tobacco Use   Smoking status: Former    Packs/day: 1.50    Years: 42.00    Pack years: 63.00    Types: Cigarettes    Quit date: 09/03/2013    Years since quitting: 7.7   Smokeless tobacco: Former    Types: Chew   Tobacco comments:    We discussed the need to remain smoke free and to call me for help if he has the desire to smoke again.  Vaping Use   Vaping Use: Never used  Substance and Sexual Activity   Alcohol use: No    Alcohol/week: 0.0 standard drinks    Comment: recovering alcoholic    Drug use: No   Sexual activity: Not on file  Other Topics Concern   Not on file  Social History Narrative   Recovering alcoholic      Minister  Social Determinants of Health   Financial Resource Strain: Not on file  Food Insecurity: Not on file  Transportation Needs: Not on file  Physical Activity: Not on file  Stress: Not on file  Social Connections: Not on file  Intimate Partner Violence: Not on file   FH:  Family History  Problem Relation Age of Onset   Cancer Mother        lung   Crohn's disease Father    Alcohol abuse Brother    Colon polyps Brother    Colon cancer Neg Hx    Current Outpatient Medications  Medication Sig Dispense Refill   atorvastatin (LIPITOR) 80 MG tablet Take 80 mg by mouth at bedtime.     carboxymethylcellulose (REFRESH  PLUS) 0.5 % SOLN Place 1 drop into both eyes 3 (three) times daily as needed (dry eyes).     carvedilol (COREG) 6.25 MG tablet Take 1 tablet (6.25 mg total) by mouth 2 (two) times daily with a meal. 180 tablet 3   cyclobenzaprine (FLEXERIL) 5 MG tablet Take 5 mg by mouth as needed for muscle spasms.     diclofenac sodium (VOLTAREN) 1 % GEL Apply 4 g topically 3 (three) times daily as needed (pain). 350 g 0   docusate sodium (COLACE) 100 MG capsule Take 1 capsule (100 mg total) by mouth 2 (two) times daily. 28 capsule 0   empagliflozin (JARDIANCE) 25 MG TABS tablet Take 12.5 mg by mouth daily.     famotidine (PEPCID) 40 MG tablet Take 40 mg by mouth daily as needed for heartburn or indigestion.     furosemide (LASIX) 20 MG tablet Take 1 tablet (20 mg total) by mouth daily as needed for fluid or edema. 30 tablet 2   gabapentin (NEURONTIN) 400 MG capsule Take 400-800 mg by mouth See admin instructions. Takes '400mg'$  three times a day as needed for nerve pain. Take '800mg'$  every night at bedtime.     icosapent Ethyl (VASCEPA) 1 g capsule Take 2 capsules (2 g total) by mouth 2 (two) times daily. 120 capsule 11   insulin aspart (NOVOLOG) 100 UNIT/ML injection Inject 35-50 Units into the skin 3 (three) times daily before meals. 35 units in AM, 35 units at Noon, and 50 units at supper     insulin glargine (LANTUS) 100 UNIT/ML injection Inject 80 Units into the skin at bedtime.     loratadine (CLARITIN) 10 MG tablet Take 10 mg by mouth daily.     Multiple Vitamins-Minerals (MULTIVITAMIN WITH MINERALS) tablet Take 1 tablet by mouth daily.     nitroGLYCERIN (NITROSTAT) 0.4 MG SL tablet Place 1 tablet (0.4 mg total) under the tongue every 5 (five) minutes x 3 doses as needed for chest pain. Call MD if you need two or more doses 30 tablet 12   pantoprazole (PROTONIX) 40 MG tablet Take 40 mg by mouth 2 (two) times daily before a meal.     Polyethyl Glycol-Propyl Glycol (SYSTANE) 0.4-0.3 % GEL ophthalmic gel Place 1  application. into both eyes every 6 (six) hours as needed (dry eyes).     Semaglutide (OZEMPIC, 0.25 OR 0.5 MG/DOSE, [place]) Inject into the skin.     clopidogrel (PLAVIX) 75 MG tablet Take 1 tablet (75 mg total) by mouth daily. 90 tablet 3   sacubitril-valsartan (ENTRESTO) 24-26 MG Take 1 tablet by mouth 2 (two) times daily. 180 tablet 3   No current facility-administered medications for this encounter.   BP 110/70   Pulse 80  Wt (!) 141.4 kg (311 lb 12.8 oz)   SpO2 95%   BMI 42.29 kg/m   Wt Readings from Last 3 Encounters:  06/01/21 (!) 141.4 kg (311 lb 12.8 oz)  05/24/21 (!) 139.4 kg (307 lb 5.1 oz)  05/14/21 (!) 139.3 kg (307 lb 1.6 oz)   PHYSICAL EXAM: General: NAD, obese.  Neck: No JVD, no thyromegaly or thyroid nodule.  Lungs: Clear to auscultation bilaterally with normal respiratory effort. CV: Nondisplaced PMI.  Heart regular S1/S2, no S3/S4, no murmur.  No peripheral edema.  No carotid bruit.  Normal pedal pulses.  Abdomen: Soft, nontender, no hepatosplenomegaly, no distention.  Skin: Intact without lesions or rashes.  Neurologic: Alert and oriented x 3.  Psych: Normal affect. Extremities: No clubbing or cyanosis.  HEENT: Normal.   ASSESSMENT & PLAN:  1. Chronic diastolic CHF: Ischemic cardiomyopathy, EF in the 45% range on initial echo in 5/22 but back up to 60-65% on repeat limited echo also in 5/22.  Echo in 2/23 showed EF 50-55% with inferior hypokinesis.  NYHA class II, not volume overloaded by exam or Optivol.  - Continue Jardiance.  - Continue carvedilol 6.25 mg bid.   - Continue Entresto 24/26 bid.  BMET today.  2. CAD: LHC in 5/22 showed CTO of the mid RCA with good collaterals.  I do not think this was acute MI from plaque rupture.   - Continue Plavix long-term, can stop ASA.  - Continue statin/Vascepa, check lipids today.   3. Cardiac arrest: In 5/22. Suspect scar-mediated from old RCA occlusion. EF now low normal to mildly reduced.  Medtronic ICD for  secondary prevention.     4. Diabetes: Per PCP.  5. 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  No further HB. 6. HTN: BP controlled.  7. Obesity: Continue semaglutide.   Follow up in 4 months with APP.   [name] 06/01/2021

## 2021-06-02 ENCOUNTER — Encounter (HOSPITAL_COMMUNITY)
Admission: RE | Admit: 2021-06-02 | Discharge: 2021-06-02 | Disposition: A | Discharge status: Home / Self Care | Payer: No Typology Code available for payment source | Source: Ambulatory Visit | Attending: Family Medicine | Admitting: Family Medicine

## 2021-06-02 DIAGNOSIS — I213 ST elevation (STEMI) myocardial infarction of unspecified site: Secondary | ICD-10-CM

## 2021-06-02 DIAGNOSIS — I2510 Atherosclerotic heart disease of native coronary artery without angina pectoris: Secondary | ICD-10-CM

## 2021-06-02 LAB — GLUCOSE, CAPILLARY: Glucose-Capillary: 104 mg/dL — ABNORMAL HIGH (ref 70–99)

## 2021-06-02 NOTE — Progress Notes (Addendum)
Daily Session Note  Patient Details  Name: [name] MRN: [id number] Date of Birth: 1955-01-12 Referring Provider:   Flowsheet Row CARDIAC REHAB PHASE II ORIENTATION from 05/14/2021 in [place]  Referring Provider Dr. Alferd Patee       Encounter Date: 06/02/2021  Check In:  Session Check In - 06/02/21 1058       Check-In   Supervising physician immediately available to respond to emergencies [facility] MD immediately available    Location [facility]    Staff Present Aundra Dubin, RN, Joanette Gula, RN, Bjorn Loser, MS, ACSM-CEP, Exercise Physiologist    Virtual Visit No    Medication changes reported     No    Fall or balance concerns reported    No    Tobacco Cessation No Change    Resistance Training Performed Yes    VAD Patient? No    PAD/SET Patient? No      Pain Assessment   Currently in Pain? No/denies    Pain Score 0-No pain    Multiple Pain Sites No             Capillary Blood Glucose: Results for orders placed or performed during the hospital encounter of 06/01/21 (from the past 24 hour(s))  Basic Metabolic Panel (BMET)     Status: Abnormal   Collection Time: 06/01/21  2:00 PM  Result Value Ref Range   Sodium 140 135 - 145 mmol/L   Potassium 4.8 3.5 - 5.1 mmol/L   Chloride 106 98 - 111 mmol/L   CO2 26 22 - 32 mmol/L   Glucose, Bld 117 (H) 70 - 99 mg/dL   BUN 20 8 - 23 mg/dL   Creatinine, Ser 1.09 0.61 - 1.24 mg/dL   Calcium 8.7 (L) 8.9 - 10.3 mg/dL   GFR, Estimated >60 >60 mL/min   Anion gap 8 5 - 15  Lipid Profile     Status: Abnormal   Collection Time: 06/01/21  2:00 PM  Result Value Ref Range   Cholesterol 154 0 - 200 mg/dL   Triglycerides 262 (H) <150 mg/dL   HDL 31 (L) >40 mg/dL   Total CHOL/HDL Ratio 5.0 RATIO   VLDL 52 (H) 0 - 40 mg/dL   LDL Cholesterol 71 0 - 99 mg/dL      Social History   Tobacco Use  Smoking Status Former   Packs/day: 1.50   Years: 42.00   Pack years: 63.00    Types: Cigarettes   Quit date: 09/03/2013   Years since quitting: 7.7  Smokeless Tobacco Former   Types: Chew  Tobacco Comments   We discussed the need to remain smoke free and to call me for help if he has the desire to smoke again.    Goals Met:    Goals Unmet:  Not Applicable  Comments:  Pt did not complete rehab session.  CBG 107 with snack.  Pt checked out at 1115.     Dr. Carlyle Dolly is Medical Director for [facility] Cardiac Rehab

## 2021-06-04 ENCOUNTER — Encounter (HOSPITAL_COMMUNITY)
Admission: RE | Admit: 2021-06-04 | Discharge: 2021-06-04 | Disposition: A | Discharge status: Home / Self Care | Payer: No Typology Code available for payment source | Source: Ambulatory Visit | Attending: Family Medicine | Admitting: Family Medicine

## 2021-06-04 ENCOUNTER — Ambulatory Visit (INDEPENDENT_AMBULATORY_CARE_PROVIDER_SITE_OTHER): Payer: No Typology Code available for payment source

## 2021-06-04 DIAGNOSIS — I213 ST elevation (STEMI) myocardial infarction of unspecified site: Secondary | ICD-10-CM | POA: Insufficient documentation

## 2021-06-04 DIAGNOSIS — I469 Cardiac arrest, cause unspecified: Secondary | ICD-10-CM | POA: Diagnosis not present

## 2021-06-04 DIAGNOSIS — I2510 Atherosclerotic heart disease of native coronary artery without angina pectoris: Secondary | ICD-10-CM | POA: Diagnosis present

## 2021-06-04 LAB — CUP PACEART REMOTE DEVICE CHECK
Battery Remaining Longevity: 123 mo
Battery Voltage: 3.02 V
Brady Statistic AP VP Percent: 0.01 %
Brady Statistic AP VS Percent: 14.54 %
Brady Statistic AS VP Percent: 0.07 %
Brady Statistic AS VS Percent: 85.38 %
Brady Statistic RA Percent Paced: 14.46 %
Brady Statistic RV Percent Paced: 0.08 %
Date Time Interrogation Session: 20230602012503
HighPow Impedance: 90 Ohm
Implantable Lead Implant Date: 20220526
Implantable Lead Implant Date: 20220526
Implantable Lead Location: 753859
Implantable Lead Location: 753860
Implantable Lead Model: 5076
Implantable Pulse Generator Implant Date: 20220526
Lead Channel Impedance Value: 418 Ohm
Lead Channel Impedance Value: 513 Ohm
Lead Channel Impedance Value: 532 Ohm
Lead Channel Pacing Threshold Amplitude: 0.5 V
Lead Channel Pacing Threshold Amplitude: 0.625 V
Lead Channel Pacing Threshold Pulse Width: 0.4 ms
Lead Channel Pacing Threshold Pulse Width: 0.4 ms
Lead Channel Sensing Intrinsic Amplitude: 2.5 mV
Lead Channel Sensing Intrinsic Amplitude: 2.5 mV
Lead Channel Sensing Intrinsic Amplitude: 6.375 mV
Lead Channel Sensing Intrinsic Amplitude: 6.375 mV
Lead Channel Setting Pacing Amplitude: 1.5 V
Lead Channel Setting Pacing Amplitude: 2 V
Lead Channel Setting Pacing Pulse Width: 0.4 ms
Lead Channel Setting Sensing Sensitivity: 0.3 mV

## 2021-06-04 NOTE — Progress Notes (Signed)
Daily Session Note  Patient Details  Name: [name] MRN: [id number] Date of Birth: 1955/07/07 Referring Provider:   Flowsheet Row CARDIAC REHAB PHASE II ORIENTATION from 05/14/2021 in [place]  Referring Provider Dr. Alferd Patee       Encounter Date: 06/04/2021  Check In:  Session Check In - 06/04/21 1056       Check-In   Supervising physician immediately available to respond to emergencies [facility] MD immediately available    Physician(s) Dr Gardiner Rhyme    Location [facility]    Staff Present Aundra Dubin, RN, BSN;Heather Otho Ket, BS, Exercise Physiologist;Lashanti Chambless Hassell Done, RN, Bjorn Loser, MS, ACSM-CEP, Exercise Physiologist    Virtual Visit No    Medication changes reported     No    Fall or balance concerns reported    No    Tobacco Cessation No Change    Warm-up and Cool-down Performed as group-led instruction    Resistance Training Performed Yes    VAD Patient? No    PAD/SET Patient? No      Pain Assessment   Currently in Pain? No/denies    Pain Score 0-No pain    Multiple Pain Sites No             Capillary Blood Glucose: No results found for this or any previous visit (from the past 24 hour(s)).    Social History   Tobacco Use  Smoking Status Former   Packs/day: 1.50   Years: 42.00   Pack years: 63.00   Types: Cigarettes   Quit date: 09/03/2013   Years since quitting: 7.7  Smokeless Tobacco Former   Types: Chew  Tobacco Comments   We discussed the need to remain smoke free and to call me for help if he has the desire to smoke again.    Goals Met:  Independence with exercise equipment Exercise tolerated well No report of concerns or symptoms today Strength training completed today  Goals Unmet:  Not Applicable  Comments: Checkout at 1200.   Dr. Carlyle Dolly is Medical Director for [facility]

## 2021-06-07 ENCOUNTER — Encounter (HOSPITAL_COMMUNITY)
Admission: RE | Admit: 2021-06-07 | Discharge: 2021-06-07 | Disposition: A | Discharge status: Home / Self Care | Payer: No Typology Code available for payment source | Source: Ambulatory Visit | Attending: Family Medicine | Admitting: Family Medicine

## 2021-06-07 DIAGNOSIS — I213 ST elevation (STEMI) myocardial infarction of unspecified site: Secondary | ICD-10-CM

## 2021-06-07 DIAGNOSIS — I2510 Atherosclerotic heart disease of native coronary artery without angina pectoris: Secondary | ICD-10-CM

## 2021-06-07 NOTE — Progress Notes (Signed)
Incomplete Session Note  Patient Details  Name: [name] MRN: [id number] Date of Birth: 07/24/1955 Referring Provider:   Flowsheet Row CARDIAC REHAB PHASE II ORIENTATION from 05/14/2021 in [place]  Referring Provider Dr. Lawana Pai did not complete his rehab session.  Pt BP was lower then normal (94/56). He complained about not feeling off and blamed it on medication changes for his DM. His blood sugar 127. We advised pt to go home and drink fluids.

## 2021-06-09 ENCOUNTER — Encounter (HOSPITAL_COMMUNITY)
Admission: RE | Admit: 2021-06-09 | Discharge: 2021-06-09 | Disposition: A | Discharge status: Home / Self Care | Payer: No Typology Code available for payment source | Source: Ambulatory Visit | Attending: Family Medicine | Admitting: Family Medicine

## 2021-06-09 DIAGNOSIS — I2510 Atherosclerotic heart disease of native coronary artery without angina pectoris: Secondary | ICD-10-CM | POA: Diagnosis not present

## 2021-06-09 DIAGNOSIS — I213 ST elevation (STEMI) myocardial infarction of unspecified site: Secondary | ICD-10-CM

## 2021-06-09 NOTE — Progress Notes (Signed)
Daily Session Note  Patient Details  Name: [name] MRN: [id number] Date of Birth: 1955/01/19 Referring Provider:   Flowsheet Row CARDIAC REHAB PHASE II ORIENTATION from 05/14/2021 in [place]  Referring Provider Dr. Alferd Patee       Encounter Date: 06/09/2021  Check In:  Session Check In - 06/09/21 1058       Check-In   Supervising physician immediately available to respond to emergencies [facility] immediately available    Physician(s) Dr Johnsie Cancel    Location [facility]    Staff Present Redge Gainer, BS, Exercise Physiologist;Debra Wynetta Emery, RN, Joanette Gula, RN, BSN    Virtual Visit No    Medication changes reported     No    Fall or balance concerns reported    No    Comments Patient has a history of falls and impaired balance.    Tobacco Cessation No Change    Warm-up and Cool-down Performed as group-led instruction    Resistance Training Performed Yes    VAD Patient? No    PAD/SET Patient? No      Pain Assessment   Currently in Pain? No/denies    Pain Score 0-No pain    Multiple Pain Sites No             Capillary Blood Glucose: No results found for this or any previous visit (from the past 24 hour(s)).    Social History   Tobacco Use  Smoking Status Former   Packs/day: 1.50   Years: 42.00   Pack years: 63.00   Types: Cigarettes   Quit date: 09/03/2013   Years since quitting: 7.7  Smokeless Tobacco Former   Types: Chew  Tobacco Comments   We discussed the need to remain smoke free and to call me for help if he has the desire to smoke again.    Goals Met:  Independence with exercise equipment Exercise tolerated well No report of concerns or symptoms today Strength training completed today  Goals Unmet:  Not Applicable  Comments: checkout at 1200.   Dr. Carlyle Dolly is Medical Director for [facility]

## 2021-06-10 ENCOUNTER — Telehealth (HOSPITAL_COMMUNITY): Payer: Self-pay | Admitting: *Deleted

## 2021-06-10 NOTE — Telephone Encounter (Signed)
He can stay off Entresto.

## 2021-06-10 NOTE — Telephone Encounter (Signed)
Pt called to report he has been having issues with low BP. He states on Mon 6/5 it was 90/51 at cardiac rehab and they wouldn't let him exercise, by Mon evening it had came up to 101/64, he states he felt very "run down" and fatigues on Monday. Since then he stopped his Entresto and BP has been gradually getting better, 113/65, 115/70, and he feels better as well. He has not been taking any lasix as it is only prn. Will send to Dr Aundra Dubin for further review/recommendations.

## 2021-06-11 ENCOUNTER — Other Ambulatory Visit: Payer: Self-pay | Admitting: Cardiology

## 2021-06-11 ENCOUNTER — Encounter (HOSPITAL_COMMUNITY)
Admission: RE | Admit: 2021-06-11 | Discharge: 2021-06-11 | Disposition: A | Discharge status: Home / Self Care | Payer: No Typology Code available for payment source | Source: Ambulatory Visit | Attending: Family Medicine | Admitting: Family Medicine

## 2021-06-11 DIAGNOSIS — I2510 Atherosclerotic heart disease of native coronary artery without angina pectoris: Secondary | ICD-10-CM | POA: Diagnosis not present

## 2021-06-11 DIAGNOSIS — I213 ST elevation (STEMI) myocardial infarction of unspecified site: Secondary | ICD-10-CM

## 2021-06-11 NOTE — Progress Notes (Signed)
Remote ICD transmission.   

## 2021-06-11 NOTE — Telephone Encounter (Signed)
Pt aware, med list updated.

## 2021-06-11 NOTE — Progress Notes (Signed)
Daily Session Note  Patient Details  Name: NEALE MARZETTE MRN: [id number] Date of Birth: 11/28/1955 Referring Provider:   Flowsheet Row CARDIAC REHAB PHASE II ORIENTATION from 05/14/2021 in [place]  Referring Provider Dr. Alferd Patee       Encounter Date: 06/11/2021  Check In:  Session Check In - 06/11/21 1100       Check-In   Supervising physician immediately available to respond to emergencies [facility] immediately available    Physician(s) Dr. Domenic Polite    Location [facility]    Staff Present Redge Gainer, BS, Exercise Physiologist;Debra Wynetta Emery, RN, BSN    Virtual Visit No    Medication changes reported     No    Fall or balance concerns reported    No    Tobacco Cessation No Change    Warm-up and Cool-down Performed as group-led instruction    Resistance Training Performed Yes    VAD Patient? No    PAD/SET Patient? No      Pain Assessment   Currently in Pain? No/denies    Pain Score 0-No pain    Multiple Pain Sites No             Capillary Blood Glucose: No results found for this or any previous visit (from the past 24 hour(s)).    Social History   Tobacco Use  Smoking Status Former   Packs/day: 1.50   Years: 42.00   Total pack years: 63.00   Types: Cigarettes   Quit date: 09/03/2013   Years since quitting: 7.7  Smokeless Tobacco Former   Types: Chew  Tobacco Comments   We discussed the need to remain smoke free and to call me for help if he has the desire to smoke again.    Goals Met:  Independence with exercise equipment Exercise tolerated well No report of concerns or symptoms today Strength training completed today  Goals Unmet:  Not Applicable  Comments: check out 1200   Dr. Carlyle Dolly is Medical Director for [place]

## 2021-06-14 ENCOUNTER — Encounter (HOSPITAL_COMMUNITY)
Admission: RE | Admit: 2021-06-14 | Discharge: 2021-06-14 | Disposition: A | Discharge status: Home / Self Care | Payer: No Typology Code available for payment source | Source: Ambulatory Visit | Attending: Family Medicine | Admitting: Family Medicine

## 2021-06-14 DIAGNOSIS — I213 ST elevation (STEMI) myocardial infarction of unspecified site: Secondary | ICD-10-CM

## 2021-06-14 DIAGNOSIS — I2510 Atherosclerotic heart disease of native coronary artery without angina pectoris: Secondary | ICD-10-CM

## 2021-06-14 NOTE — Progress Notes (Signed)
Daily Session Note  Patient Details  Name: [name] MRN: [id number] Date of Birth: 16-Nov-1955 Referring Provider:   Flowsheet Row CARDIAC REHAB PHASE II ORIENTATION from 05/14/2021 in [place]  Referring Provider Dr. Alferd Patee       Encounter Date: 06/14/2021  Check In:  Session Check In - 06/14/21 1100       Check-In   Supervising physician immediately available to respond to emergencies [facility] immediately available    Physician(s) Dr. Debara Pickett    Location [facility]    Staff Present Redge Gainer, BS, Exercise Physiologist;Dalton Kris Mouton, MS, ACSM-CEP, Exercise Physiologist    Virtual Visit No    Medication changes reported     No    Fall or balance concerns reported    No    Tobacco Cessation No Change    Warm-up and Cool-down Performed as group-led instruction    Resistance Training Performed Yes    VAD Patient? No    PAD/SET Patient? No      Pain Assessment   Currently in Pain? No/denies    Pain Score 0-No pain    Multiple Pain Sites No             Capillary Blood Glucose: No results found for this or any previous visit (from the past 24 hour(s)).    Social History   Tobacco Use  Smoking Status Former   Packs/day: 1.50   Years: 42.00   Total pack years: 63.00   Types: Cigarettes   Quit date: 09/03/2013   Years since quitting: 7.7  Smokeless Tobacco Former   Types: Chew  Tobacco Comments   We discussed the need to remain smoke free and to call me for help if he has the desire to smoke again.    Goals Met:  Independence with exercise equipment Exercise tolerated well No report of concerns or symptoms today Strength training completed today  Goals Unmet:  Not Applicable  Comments: check out 1200   Dr. Carlyle Dolly is Medical Director for [place]

## 2021-06-16 ENCOUNTER — Encounter (HOSPITAL_COMMUNITY)
Admission: RE | Admit: 2021-06-16 | Discharge: 2021-06-16 | Disposition: A | Discharge status: Home / Self Care | Payer: No Typology Code available for payment source | Source: Ambulatory Visit | Attending: Family Medicine | Admitting: Family Medicine

## 2021-06-16 DIAGNOSIS — I2510 Atherosclerotic heart disease of native coronary artery without angina pectoris: Secondary | ICD-10-CM

## 2021-06-16 DIAGNOSIS — I213 ST elevation (STEMI) myocardial infarction of unspecified site: Secondary | ICD-10-CM

## 2021-06-16 NOTE — Progress Notes (Signed)
Daily Session Note  Patient Details  Name: [name] MRN: [id number] Date of Birth: 12/12/1955 Referring Provider:   Flowsheet Row CARDIAC REHAB PHASE II ORIENTATION from 05/14/2021 in [place] [facility]  Referring Provider Dr. Salman       Encounter Date: 06/16/2021  Check In:  Session Check In - 06/16/21 1100       Check-In   Supervising physician immediately available to respond to emergencies [facility] immediately available    Physician(s) Dr. Turner    Location [facility]    Staff Present Heather Jachimiak, BS, Exercise Physiologist;Dalton Fletcher, MS, ACSM-CEP, Exercise Physiologist;Daphyne Martin, RN, BSN    Virtual Visit No    Medication changes reported     No    Fall or balance concerns reported    No    Tobacco Cessation No Change    Warm-up and Cool-down Performed as group-led instruction    Resistance Training Performed Yes    VAD Patient? No    PAD/SET Patient? No      Pain Assessment   Currently in Pain? Yes    Pain Score 5     Pain Location Knee    Pain Orientation Left    Pain Descriptors / Indicators Constant;Aching    Pain Type Chronic pain    Pain Onset More than a month ago    Pain Frequency Constant    Multiple Pain Sites No             Capillary Blood Glucose: No results found for this or any previous visit (from the past 24 hour(s)).    Social History   Tobacco Use  Smoking Status Former   Packs/day: 1.50   Years: 42.00   Total pack years: 63.00   Types: Cigarettes   Quit date: 09/03/2013   Years since quitting: 7.7  Smokeless Tobacco Former   Types: Chew  Tobacco Comments   We discussed the need to remain smoke free and to call me for help if he has the desire to smoke again.    Goals Met:  Independence with exercise equipment Exercise tolerated well No report of concerns or symptoms today Strength training completed today  Goals Unmet:  Not Applicable  Comments: check out  1200    Dr. Jonathan Branch is Medical Director for [facility] 

## 2021-06-18 ENCOUNTER — Encounter (HOSPITAL_COMMUNITY)
Admission: RE | Admit: 2021-06-18 | Discharge: 2021-06-18 | Disposition: A | Discharge status: Home / Self Care | Payer: No Typology Code available for payment source | Source: Ambulatory Visit | Attending: Family Medicine | Admitting: Family Medicine

## 2021-06-18 DIAGNOSIS — I2510 Atherosclerotic heart disease of native coronary artery without angina pectoris: Secondary | ICD-10-CM | POA: Diagnosis not present

## 2021-06-21 ENCOUNTER — Encounter (HOSPITAL_COMMUNITY)
Admission: RE | Admit: 2021-06-21 | Discharge: 2021-06-21 | Disposition: A | Discharge status: Home / Self Care | Payer: No Typology Code available for payment source | Source: Ambulatory Visit | Attending: Family Medicine | Admitting: Family Medicine

## 2021-06-21 VITALS — Wt 311.5 lb

## 2021-06-21 DIAGNOSIS — I2510 Atherosclerotic heart disease of native coronary artery without angina pectoris: Secondary | ICD-10-CM | POA: Diagnosis not present

## 2021-06-21 DIAGNOSIS — I213 ST elevation (STEMI) myocardial infarction of unspecified site: Secondary | ICD-10-CM

## 2021-06-21 NOTE — Progress Notes (Signed)
Daily Session Note  Patient Details  Name: [name] MRN: [id number] Date of Birth: 1955-11-03 Referring Provider:   Flowsheet Row CARDIAC REHAB PHASE II ORIENTATION from 05/14/2021 in [place]  Referring Provider Dr. Alferd Patee       Encounter Date: 06/21/2021  Check In:  Session Check In - 06/21/21 1100       Check-In   Supervising physician immediately available to respond to emergencies [facility] immediately available    Physician(s) Dr Kristine Garbe    Location [facility]    Staff Present Redge Gainer, BS, Exercise Physiologist;Dixie Jafri Hassell Done, RN, Jennye Moccasin, RN, BSN    Virtual Visit No    Medication changes reported     No    Fall or balance concerns reported    No    Comments Patient has a history of falls and impaired balance.    Tobacco Cessation No Change    Warm-up and Cool-down Performed as group-led instruction    Resistance Training Performed Yes    VAD Patient? No    PAD/SET Patient? No      Pain Assessment   Currently in Pain? No/denies    Pain Score 0-No pain    Multiple Pain Sites No             Capillary Blood Glucose: No results found for this or any previous visit (from the past 24 hour(s)).    Social History   Tobacco Use  Smoking Status Former   Packs/day: 1.50   Years: 42.00   Total pack years: 63.00   Types: Cigarettes   Quit date: 09/03/2013   Years since quitting: 7.8  Smokeless Tobacco Former   Types: Chew  Tobacco Comments   We discussed the need to remain smoke free and to call me for help if he has the desire to smoke again.    Goals Met:  Independence with exercise equipment Exercise tolerated well No report of concerns or symptoms today Strength training completed today  Goals Unmet:  Not Applicable  Comments: Checkout at 1200.   Dr. Carlyle Dolly is Medical Director for [facility]

## 2021-06-23 ENCOUNTER — Encounter (HOSPITAL_COMMUNITY)
Admission: RE | Admit: 2021-06-23 | Discharge: 2021-06-23 | Disposition: A | Discharge status: Home / Self Care | Payer: No Typology Code available for payment source | Source: Ambulatory Visit | Attending: Family Medicine | Admitting: Family Medicine

## 2021-06-23 DIAGNOSIS — I213 ST elevation (STEMI) myocardial infarction of unspecified site: Secondary | ICD-10-CM

## 2021-06-23 DIAGNOSIS — I2510 Atherosclerotic heart disease of native coronary artery without angina pectoris: Secondary | ICD-10-CM | POA: Diagnosis not present

## 2021-06-23 NOTE — Progress Notes (Signed)
Cardiac Individual Treatment Plan  Patient Details  Name: [name] MRN: [id number] Date of Birth: 1955/12/11 Referring Provider:   Flowsheet Row CARDIAC REHAB PHASE II ORIENTATION from 05/14/2021 in [place]  Referring Provider Dr. Alferd Patee       Initial Encounter Date:  Flowsheet Row CARDIAC REHAB PHASE II ORIENTATION from 05/14/2021 in [place]  Date 05/14/21       Visit Diagnosis: ST elevation myocardial infarction (STEMI), unspecified artery ([place])  Atherosclerosis of native coronary artery of native heart without angina pectoris  Patient's Home Medications on Admission:  Current Outpatient Medications:    atorvastatin (LIPITOR) 80 MG tablet, Take 80 mg by mouth at bedtime., Disp: , Rfl:    carboxymethylcellulose (REFRESH PLUS) 0.5 % SOLN, Place 1 drop into both eyes 3 (three) times daily as needed (dry eyes)., Disp: , Rfl:    carvedilol (COREG) 6.25 MG tablet, Take 1 tablet (6.25 mg total) by mouth 2 (two) times daily with a meal., Disp: 180 tablet, Rfl: 3   clopidogrel (PLAVIX) 75 MG tablet, Take 1 tablet (75 mg total) by mouth daily., Disp: 90 tablet, Rfl: 3   cyclobenzaprine (FLEXERIL) 5 MG tablet, Take 5 mg by mouth as needed for muscle spasms., Disp: , Rfl:    diclofenac sodium (VOLTAREN) 1 % GEL, Apply 4 g topically 3 (three) times daily as needed (pain)., Disp: 350 g, Rfl: 0   docusate sodium (COLACE) 100 MG capsule, Take 1 capsule (100 mg total) by mouth 2 (two) times daily., Disp: 28 capsule, Rfl: 0   empagliflozin (JARDIANCE) 25 MG TABS tablet, Take 12.5 mg by mouth daily., Disp: , Rfl:    famotidine (PEPCID) 40 MG tablet, Take 40 mg by mouth daily as needed for heartburn or indigestion., Disp: , Rfl:    furosemide (LASIX) 20 MG tablet, Take 1 tablet (20 mg total) by mouth daily as needed for fluid or edema., Disp: 30 tablet, Rfl: 2   gabapentin (NEURONTIN) 400 MG capsule, Take 400-800 mg by mouth See admin  instructions. Takes '400mg'$  three times a day as needed for nerve pain. Take '800mg'$  every night at bedtime., Disp: , Rfl:    icosapent Ethyl (VASCEPA) 1 g capsule, Take 2 capsules (2 g total) by mouth 2 (two) times daily., Disp: 120 capsule, Rfl: 11   insulin aspart (NOVOLOG) 100 UNIT/ML injection, Inject 35-50 Units into the skin 3 (three) times daily before meals. 35 units in AM, 35 units at [place], and 50 units at supper, Disp: , Rfl:    insulin glargine (LANTUS) 100 UNIT/ML injection, Inject 80 Units into the skin at bedtime., Disp: , Rfl:    loratadine (CLARITIN) 10 MG tablet, Take 10 mg by mouth daily., Disp: , Rfl:    Multiple Vitamins-Minerals (MULTIVITAMIN WITH MINERALS) tablet, Take 1 tablet by mouth daily., Disp: , Rfl:    nitroGLYCERIN (NITROSTAT) 0.4 MG SL tablet, Place 1 tablet (0.4 mg total) under the tongue every 5 (five) minutes x 3 doses as needed for chest pain. Call MD if you need two or more doses, Disp: 30 tablet, Rfl: 12   pantoprazole (PROTONIX) 40 MG tablet, Take 40 mg by mouth 2 (two) times daily before a meal., Disp: , Rfl:    Polyethyl Glycol-Propyl Glycol (SYSTANE) 0.4-0.3 % GEL ophthalmic gel, Place 1 application. into both eyes every 6 (six) hours as needed (dry eyes)., Disp: , Rfl:    Semaglutide (OZEMPIC, 0.25 OR 0.5 MG/DOSE, [place]), Inject into the skin., Disp: ,  Rfl:   Past Medical History: 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    Labs: Review Flowsheet  More data exists      Latest Ref Rng & Units 05/24/2020 05/25/2020 05/26/2020 02/26/2021  Labs for ITP Cardiac and Pulmonary Rehab  Cholestrol 0 - 200 mg/dL - - - 156   LDL (calc) 0 - 99 mg/dL - - - 54   HDL-C >40 mg/dL - - - 31   Trlycerides <150 mg/dL - - - 357   Hemoglobin A1c 4.8 - 5.6 % - - - 6.3   PH, Arterial 7.350 - 7.450 7.559  - - -  PCO2 arterial 32.0 - 48.0 mmHg 32.3  - - -  Bicarbonate 20.0 - 28.0 mmol/L 28.7  - - -  TCO2 22 - 32 mmol/L 30  - - -  O2 Saturation % 62.9  97.0  57.4  74.8  -      06/01/2021  Labs for ITP Cardiac and Pulmonary Rehab  Cholestrol 154   LDL (calc) 71   HDL-C 31   Trlycerides 262   Hemoglobin A1c -  PH, Arterial -  PCO2 arterial -  Bicarbonate -  TCO2 -  O2 Saturation -    Capillary Blood Glucose: Lab Results  Component Value Date   GLUCAP 104 (H) 06/02/2021   GLUCAP 115 (H) 06/10/2020   GLUCAP 290 (H) 06/09/2020   GLUCAP 217 (H) 06/09/2020   GLUCAP 159 (H) 06/09/2020     Exercise Target Goals: Exercise Program Goal: Individual exercise prescription set using results from initial 6 min walk test and THRR while considering  patient's activity barriers and safety.   Exercise Prescription Goal: Starting with aerobic activity 30 plus minutes a day, 3 days per week for initial exercise prescription. Provide home exercise prescription and guidelines that participant acknowledges understanding prior to discharge.  Activity Barriers & Risk Stratification:  Activity Barriers & Cardiac Risk Stratification - 05/14/21 1235       Activity Barriers & Cardiac Risk Stratification   Activity Barriers Left Hip Replacement;Right Hip Replacement;Left Knee  Replacement;Joint Problems;Arthritis;Neck/Spine Problems;History of Falls    Cardiac Risk Stratification High             6 Minute Walk:  6 Minute Walk     Row Name 05/14/21 1402         6 Minute Walk   Phase Initial     Distance 1200 feet     Walk  Time 6 minutes     # of Rest Breaks 0     MPH 2.27     METS 2.18     RPE 12     VO2 Peak 7.64     Symptoms Yes (comment)     Comments L hip pain due to a recent fall and R knee pain     Resting HR 68 bpm     Resting BP 112/60     Resting Oxygen Saturation  94 %     Exercise Oxygen Saturation  during 6 min walk 98 %     Max Ex. HR 109 bpm     Max Ex. BP 140/70     2 Minute Post BP 130/66              Oxygen Initial Assessment:   Oxygen Re-Evaluation:   Oxygen Discharge (Final Oxygen Re-Evaluation):   Initial Exercise Prescription:  Initial Exercise Prescription - 05/14/21 1400       Date of Initial Exercise RX and Referring Provider   Date 05/14/21    Referring Provider Dr. Alferd Patee    Expected Discharge Date 08/04/21      NuStep   Level 1    SPM 60    Minutes 22      Arm Ergometer   Level 1    RPM 50    Minutes 17      Prescription Details   Frequency (times per week) 3    Duration Progress to 30 minutes of continuous aerobic without signs/symptoms of physical distress      Intensity   THRR 40-80% of Max Heartrate 61-122    Ratings of Perceived Exertion 11-13      Resistance Training   Training Prescription Yes    Weight 3    Reps 10-15             Perform Capillary Blood Glucose checks as needed.  Exercise Prescription Changes:   Exercise Prescription Changes     Row Name 05/24/21 1400 06/04/21 1200 06/21/21 1300         Response to Exercise   Blood Pressure (Admit) 136/60 110/50 120/60     Blood Pressure (Exercise) 124/64 130/70 142/78     Blood Pressure (Exit) 130/60 102/60 108/70     Heart Rate (Admit) 85 bpm 85 bpm 74 bpm     Heart Rate (Exercise) 115 bpm 120 bpm 100  bpm     Heart Rate (Exit) 94 bpm 105 bpm 82 bpm     Rating of Perceived Exertion (Exercise) '11 11 11     '$ Duration Continue with 30 min of aerobic exercise without signs/symptoms of physical distress. Continue with 30 min of aerobic exercise without signs/symptoms of physical distress. Continue with 30 min of aerobic exercise without signs/symptoms of physical distress.     Intensity THRR unchanged THRR unchanged THRR unchanged       Progression   Progression Continue to progress workloads to maintain intensity without signs/symptoms of physical distress. Continue to progress workloads to maintain intensity without signs/symptoms of physical distress. Continue to progress workloads to maintain intensity without signs/symptoms of physical distress.       Resistance Training   Training Prescription Yes Yes Yes     Weight '5 5 5     '$ Reps 10-15 10-15 10-15     Time 10 Minutes 10 Minutes 10 Minutes       NuStep   Level 1 2 4  SPM 104 106 95     Minutes '22 22 22     '$ METs 2.13 2.26 2.27       Arm Ergometer   Level '1 2 4     '$ RPM 54 50 73     Minutes '17 17 17     '$ METs 1.49 1.64 2.85              Exercise Comments:   Exercise Goals and Review:   Exercise Goals     Row Name 05/14/21 1406 05/24/21 1428 06/21/21 1347         Exercise Goals   Increase Physical Activity Yes Yes Yes     Intervention Provide advice, education, support and counseling about physical activity/exercise needs.;Develop an individualized exercise prescription for aerobic and resistive training based on initial evaluation findings, risk stratification, comorbidities and participant's personal goals. Provide advice, education, support and counseling about physical activity/exercise needs.;Develop an individualized exercise prescription for aerobic and resistive training based on initial evaluation findings, risk stratification, comorbidities and participant's personal goals. Provide advice, education, support  and counseling about physical activity/exercise needs.;Develop an individualized exercise prescription for aerobic and resistive training based on initial evaluation findings, risk stratification, comorbidities and participant's personal goals.     Expected Outcomes Short Term: Attend rehab on a regular basis to increase amount of physical activity.;Long Term: Add in home exercise to make exercise part of routine and to increase amount of physical activity.;Long Term: Exercising regularly at least 3-5 days a week. Short Term: Attend rehab on a regular basis to increase amount of physical activity.;Long Term: Add in home exercise to make exercise part of routine and to increase amount of physical activity.;Long Term: Exercising regularly at least 3-5 days a week. Short Term: Attend rehab on a regular basis to increase amount of physical activity.;Long Term: Add in home exercise to make exercise part of routine and to increase amount of physical activity.;Long Term: Exercising regularly at least 3-5 days a week.     Increase Strength and Stamina Yes Yes Yes     Intervention Provide advice, education, support and counseling about physical activity/exercise needs.;Develop an individualized exercise prescription for aerobic and resistive training based on initial evaluation findings, risk stratification, comorbidities and participant's personal goals. Provide advice, education, support and counseling about physical activity/exercise needs.;Develop an individualized exercise prescription for aerobic and resistive training based on initial evaluation findings, risk stratification, comorbidities and participant's personal goals. Provide advice, education, support and counseling about physical activity/exercise needs.;Develop an individualized exercise prescription for aerobic and resistive training based on initial evaluation findings, risk stratification, comorbidities and participant's personal goals.     Expected  Outcomes Short Term: Increase workloads from initial exercise prescription for resistance, speed, and METs.;Short Term: Perform resistance training exercises routinely during rehab and add in resistance training at home;Long Term: Improve cardiorespiratory fitness, muscular endurance and strength as measured by increased METs and functional capacity (6MWT) Short Term: Increase workloads from initial exercise prescription for resistance, speed, and METs.;Short Term: Perform resistance training exercises routinely during rehab and add in resistance training at home;Long Term: Improve cardiorespiratory fitness, muscular endurance and strength as measured by increased METs and functional capacity (6MWT) Short Term: Increase workloads from initial exercise prescription for resistance, speed, and METs.;Short Term: Perform resistance training exercises routinely during rehab and add in resistance training at home;Long Term: Improve cardiorespiratory fitness, muscular endurance and strength as measured by increased METs and functional capacity (6MWT)     Able to understand and  use rate of perceived exertion (RPE) scale Yes Yes Yes     Intervention Provide education and explanation on how to use RPE scale Provide education and explanation on how to use RPE scale Provide education and explanation on how to use RPE scale     Expected Outcomes Short Term: Able to use RPE daily in rehab to express subjective intensity level;Long Term:  Able to use RPE to guide intensity level when exercising independently Short Term: Able to use RPE daily in rehab to express subjective intensity level;Long Term:  Able to use RPE to guide intensity level when exercising independently Short Term: Able to use RPE daily in rehab to express subjective intensity level;Long Term:  Able to use RPE to guide intensity level when exercising independently     Knowledge and understanding of Target Heart Rate Range (THRR) Yes Yes Yes     Intervention  Provide education and explanation of THRR including how the numbers were predicted and where they are located for reference Provide education and explanation of THRR including how the numbers were predicted and where they are located for reference Provide education and explanation of THRR including how the numbers were predicted and where they are located for reference     Expected Outcomes Short Term: Able to state/look up THRR;Long Term: Able to use THRR to govern intensity when exercising independently;Short Term: Able to use daily as guideline for intensity in rehab Short Term: Able to state/look up THRR;Long Term: Able to use THRR to govern intensity when exercising independently;Short Term: Able to use daily as guideline for intensity in rehab Short Term: Able to state/look up THRR;Long Term: Able to use THRR to govern intensity when exercising independently;Short Term: Able to use daily as guideline for intensity in rehab     Able to check pulse independently Yes Yes Yes     Intervention Provide education and demonstration on how to check pulse in carotid and radial arteries.;Review the importance of being able to check your own pulse for safety during independent exercise Provide education and demonstration on how to check pulse in carotid and radial arteries.;Review the importance of being able to check your own pulse for safety during independent exercise Provide education and demonstration on how to check pulse in carotid and radial arteries.;Review the importance of being able to check your own pulse for safety during independent exercise     Expected Outcomes Short Term: Able to explain why pulse checking is important during independent exercise;Long Term: Able to check pulse independently and accurately Short Term: Able to explain why pulse checking is important during independent exercise;Long Term: Able to check pulse independently and accurately Short Term: Able to explain why pulse checking is  important during independent exercise;Long Term: Able to check pulse independently and accurately     Understanding of Exercise Prescription Yes Yes Yes     Intervention Provide education, explanation, and written materials on patient's individual exercise prescription Provide education, explanation, and written materials on patient's individual exercise prescription Provide education, explanation, and written materials on patient's individual exercise prescription     Expected Outcomes Short Term: Able to explain program exercise prescription;Long Term: Able to explain home exercise prescription to exercise independently Short Term: Able to explain program exercise prescription;Long Term: Able to explain home exercise prescription to exercise independently Short Term: Able to explain program exercise prescription;Long Term: Able to explain home exercise prescription to exercise independently  Exercise Goals Re-Evaluation :  Exercise Goals Re-Evaluation     [place] Name 05/24/21 1429 06/21/21 1347           Exercise Goal Re-Evaluation   Exercise Goals Review Increase Physical Activity;Increase Strength and Stamina;Able to understand and use rate of perceived exertion (RPE) scale;Knowledge and understanding of Target Heart Rate Range (THRR);Able to check pulse independently;Understanding of Exercise Prescription Increase Strength and Stamina;Able to understand and use rate of perceived exertion (RPE) scale;Able to check pulse independently;Understanding of Exercise Prescription;Knowledge and understanding of Target Heart Rate Range (THRR);Increase Physical Activity      Comments Pt has completed 5 sessions of cardiac rehab. He is motivated when in class ans eager to progress his workload. He pushed himself the first week of class and went over his THR and was asked to slow down. He stated that level 1 feels to easy but his HR increased when he tried to increase the level. He is currently  exercising at 2.13 METs on the stepper. Will continue to monitor and progress as able. Pt has completed 14 sessions of cardiac rehab. He is motivated in class and eager to progress his workload. He states that he feels like he is not working hard enough even through his workload is level 4. He complains about his L knee hurting the day after exercise but does not take advice to use arm ergometer without the foot pedals. He is currently exercising at 2.85 METs on the arm ergometer. Will continue to monitor and progress as able.      Expected Outcomes Through exercise at rehab and at home, the patient will meet their stated goals. Through exercise at rehab and at home, the patient will meet their stated goals.                Discharge Exercise Prescription (Final Exercise Prescription Changes):  Exercise Prescription Changes - 06/21/21 1300       Response to Exercise   Blood Pressure (Admit) 120/60    Blood Pressure (Exercise) 142/78    Blood Pressure (Exit) 108/70    Heart Rate (Admit) 74 bpm    Heart Rate (Exercise) 100 bpm    Heart Rate (Exit) 82 bpm    Rating of Perceived Exertion (Exercise) 11    Duration Continue with 30 min of aerobic exercise without signs/symptoms of physical distress.    Intensity THRR unchanged      Progression   Progression Continue to progress workloads to maintain intensity without signs/symptoms of physical distress.      Resistance Training   Training Prescription Yes    Weight 5    Reps 10-15    Time 10 Minutes      NuStep   Level 4    SPM 95    Minutes 22    METs 2.27      Arm Ergometer   Level 4    RPM 73    Minutes 17    METs 2.85             Nutrition:  Target Goals: Understanding of nutrition guidelines, daily intake of sodium '1500mg'$ , cholesterol '200mg'$ , calories 30% from fat and 7% or less from saturated fats, daily to have 5 or more servings of fruits and vegetables.  Biometrics:  Pre Biometrics - 05/14/21 1407        Pre Biometrics   Height 6' (1.829 m)    Weight 139.3 kg    Waist Circumference 54 inches    Hip Circumference  51 inches    Waist to Hip Ratio 1.06 %    BMI (Calculated) 41.64    Triceps Skinfold 25 mm    % Body Fat 40.9 %    Grip Strength 19.4 kg    Flexibility 0 in    Single Leg Stand 8 seconds              Nutrition Therapy Plan and Nutrition Goals:  Nutrition Therapy & Goals - 05/14/21 1332       Personal Nutrition Goals   Comments Patient scored 69 on his diet assessment. Patient says he follows a low fat, low carb diet. Handout provided and explained regarding healthier choices and DM information. Patient verbalized understanding. We offer 2 educational sessions on heart healthy nutrition with handouts and assistance with RD referral if patient is interested.      Intervention Plan   Intervention Nutrition handout(s) given to patient.    Expected Outcomes Short Term Goal: Understand basic principles of dietary content, such as calories, fat, sodium, cholesterol and nutrients.             Nutrition Assessments:  Nutrition Assessments - 05/14/21 1332       MEDFICTS Scores   Pre Score 69            MEDIFICTS Score Key: ?70 Need to make dietary changes  40-70 Heart Healthy Diet ? 40 Therapeutic Level Cholesterol Diet   Picture Your Plate Scores: <23 Unhealthy dietary pattern with much room for improvement. 41-50 Dietary pattern unlikely to meet recommendations for good health and room for improvement. 51-60 More healthful dietary pattern, with some room for improvement.  >60 Healthy dietary pattern, although there may be some specific behaviors that could be improved.    Nutrition Goals Re-Evaluation:   Nutrition Goals Discharge (Final Nutrition Goals Re-Evaluation):   Psychosocial: Target Goals: Acknowledge presence or absence of significant depression and/or stress, maximize coping skills, provide positive support system. Participant is able to  verbalize types and ability to use techniques and skills needed for reducing stress and depression.  Initial Review & Psychosocial Screening:  Initial Psych Review & Screening - 05/14/21 1338       Initial Review   Current issues with None Identified   Patient has PTSD.     Family Dynamics   Good Support System? Yes      Barriers   Psychosocial barriers to participate in program There are no identifiable barriers or psychosocial needs.      Screening Interventions   Interventions Encouraged to exercise    Expected Outcomes Short Term goal: Utilizing psychosocial counselor, staff and physician to assist with identification of specific Stressors or current issues interfering with healing process. Setting desired goal for each stressor or current issue identified.             Quality of Life Scores:  Quality of Life - 05/14/21 1408       Quality of Life   Select Quality of Life      Quality of Life Scores   Health/Function Pre 15.23 %    Socioeconomic Pre 18.75 %    Psych/Spiritual Pre 16.21 %    Family Pre 25.2 %    GLOBAL Pre 17.59 %            Scores of 19 and below usually indicate a poorer quality of life in these areas.  A difference of  2-3 points is a clinically meaningful difference.  A difference of 2-3 points in  the total score of the Quality of Life Index has been associated with significant improvement in overall quality of life, self-image, physical symptoms, and general health in studies assessing change in quality of life.  PHQ-9: Review Flowsheet       05/14/2021 09/29/2014 08/30/2012  Depression screen PHQ 2/9  Decreased Interest 0 0 0  Down, Depressed, Hopeless 1 0 0  PHQ - 2 Score 1 0 0  Altered sleeping 1 - -  Tired, decreased energy 1 - -  Change in appetite 1 - -  Feeling bad or failure about yourself  1 - -  Trouble concentrating 0 - -  Moving slowly or fidgety/restless 0 - -  Suicidal thoughts 0 - -  PHQ-9 Score 5 - -  Difficult doing  work/chores Not difficult at all - -   Interpretation of Total Score  Total Score Depression Severity:  1-4 = Minimal depression, 5-9 = Mild depression, 10-14 = Moderate depression, 15-19 = Moderately severe depression, 20-27 = Severe depression   Psychosocial Evaluation and Intervention:  Psychosocial Evaluation - 05/14/21 1339       Psychosocial Evaluation & Interventions   Interventions Stress management education;Relaxation education;Encouraged to exercise with the program and follow exercise prescription    Comments Patient has no psychosocial barriers identified at his orientation visit. His initial PHQ-9 socre was 5 based on his not being able to do what he wants to do due to his chronic generalized OA and fear since his STEMI. He is a retired Curator. He had a STEMI 5/22 and went into cardiac arrest at home. His wife started CPR and this was continued for 64 minutes per ED note until ROSC was achieved in the ED at [facility]. He said he went to heaven 3 times. He shared vivid details about what he saw and who he talked to. He served in [employer] in [place] from PTSD. He does not feel like he has anxiety or depression but he does fear that he could die or go into cardiac arrest again. He says he thinks about it a lot. He says he is not afraid to die but wants to be here longer to see his grandchildren grow. He lives with his wife of many years and they have 3 children and multiple grandchildren. He names his wife as his support person but says he is very close to all of his family and he really enjoys his grandchildren.  He says he has a bike at home and is a member at the [facility] but wants to do the program to be monitored and learn how much he can and should do and how far he can push himself. He is ready to start.    Expected Outcomes Patient will continue to have no psychosocial barriers or issues identified.    Continue Psychosocial Services  No Follow up required              Psychosocial Re-Evaluation:  Psychosocial Re-Evaluation     [place] Name 05/17/21 1237 06/14/21 1249           Psychosocial Re-Evaluation   Current issues with None Identified  PTSD None Identified      Comments Patient is new to the program starting today. He continues to have no psychosocial barriers identified and his PTSD is managed. He seemed to enjoy his first session and demonstrates an interest in improving his health. We will continue to manage. Patient has completed 10. He continues to  have no psychosocial barriers identified and his PTSD is managed. He continues to enjoy the program and demonstrates an interest in improving his health. We will continue to monitor.      Expected Outcomes Patient will complete the program meeting both personal and program goals. Patient will complete the program meeting both personal and program goals.      Interventions Stress management education;Relaxation education;Encouraged to attend Cardiac Rehabilitation for the exercise Stress management education;Relaxation education;Encouraged to attend Cardiac Rehabilitation for the exercise      Continue Psychosocial Services  No Follow up required No Follow up required               Psychosocial Discharge (Final Psychosocial Re-Evaluation):  Psychosocial Re-Evaluation - 06/14/21 1249       Psychosocial Re-Evaluation   Current issues with None Identified    Comments Patient has completed 10. He continues to have no psychosocial barriers identified and his PTSD is managed. He continues to enjoy the program and demonstrates an interest in improving his health. We will continue to monitor.    Expected Outcomes Patient will complete the program meeting both personal and program goals.    Interventions Stress management education;Relaxation education;Encouraged to attend Cardiac Rehabilitation for the exercise    Continue Psychosocial Services  No Follow up required              Vocational Rehabilitation: Provide vocational rehab assistance to qualifying candidates.   Vocational Rehab Evaluation & Intervention:  Vocational Rehab - 05/14/21 1336       Initial Vocational Rehab Evaluation & Intervention   Assessment shows need for Vocational Rehabilitation No      Vocational Rehab Re-Evaulation   Comments Patient is retired and does not need vocational rehab.             Education: Education Goals: Education classes will be provided on a weekly basis, covering required topics. Participant will state understanding/return demonstration of topics presented.  Learning Barriers/Preferences:  Learning Barriers/Preferences - 05/14/21 1334       Learning Barriers/Preferences   Learning Barriers Sight    Learning Preferences Written Material             Education Topics: Hypertension, Hypertension Reduction -Define heart disease and high blood pressure. Discus how high blood pressure affects the body and ways to reduce high blood pressure.   Exercise and Your Heart -Discuss why it is important to exercise, the FITT principles of exercise, normal and abnormal responses to exercise, and how to exercise safely.   Angina -Discuss definition of angina, causes of angina, treatment of angina, and how to decrease risk of having angina.   Cardiac Medications -Review what the following cardiac medications are used for, how they affect the body, and side effects that may occur when taking the medications.  Medications include Aspirin, Beta blockers, calcium channel blockers, ACE Inhibitors, angiotensin receptor blockers, diuretics, digoxin, and antihyperlipidemics.   Congestive Heart Failure -Discuss the definition of CHF, how to live with CHF, the signs and symptoms of CHF, and how keep track of weight and sodium intake.   Heart Disease and Intimacy -Discus the effect sexual activity has on the heart, how changes occur during intimacy as we age, and  safety during sexual activity.   Smoking Cessation / COPD -Discuss different methods to quit smoking, the health benefits of quitting smoking, and the definition of COPD. Flowsheet Row CARDIAC REHAB PHASE II EXERCISE from 06/16/2021 in [place]  Date 05/19/21  Educator pb  Instruction Review Code 1- Verbalizes Understanding       Nutrition I: Fats -Discuss the types of cholesterol, what cholesterol does to the heart, and how cholesterol levels can be controlled. Flowsheet Row CARDIAC REHAB PHASE II EXERCISE from 06/16/2021 in [place]  Date 05/26/21  Educator [place]  Instruction Review Code 1- Verbalizes Understanding       Nutrition II: Labels -Discuss the different components of food labels and how to read food label [place] from 06/16/2021 in [place] [facility]  Date 06/09/21  Educator [place]  Instruction Review Code 1- Verbalizes Understanding       Heart Parts/Heart Disease and PAD -Discuss the anatomy of the heart, the pathway of blood circulation through the heart, and these are affected by heart disease. Flowsheet Row CARDIAC REHAB PHASE II EXERCISE from 06/16/2021 in [place]  Date 06/16/21  Educator hj  Instruction Review Code 2- Demonstrated Understanding       Stress I: Signs and Symptoms -Discuss the causes of stress, how stress may lead to anxiety and depression, and ways to limit stress.   Stress II: Relaxation -Discuss different types of relaxation techniques to limit stress.   Warning Signs of Stroke / TIA -Discuss definition of a stroke, what the signs and symptoms are of a stroke, and how to identify when someone is having stroke.   Knowledge Questionnaire Score:  Knowledge Questionnaire Score - 05/14/21 1335       Knowledge Questionnaire Score   Pre Score 23/24             Core Components/Risk Factors/Patient Goals  at Admission:  Personal Goals and Risk Factors at Admission - 05/14/21 1336       Core Components/Risk Factors/Patient Goals on Admission    Weight Management Yes    Intervention Weight Management: Develop a combined nutrition and exercise program designed to reach desired caloric intake, while maintaining appropriate intake of nutrient and fiber, sodium and fats, and appropriate energy expenditure required for the weight goal.;Weight Management/Obesity: Establish reasonable short term and long term weight goals.    Admit Weight 307 lb (139.3 kg)    Goal Weight: Short Term 302 lb (137 kg)    Goal Weight: Long Term 297 lb (134.7 kg)    Expected Outcomes Weight Maintenance: Understanding of the daily nutrition guidelines, which includes 25-35% calories from fat, 7% or less cal from saturated fats, less than '200mg'$  cholesterol, less than 1.5gm of sodium, & 5 or more servings of fruits and vegetables daily;Weight Loss: Understanding of general recommendations for a balanced deficit meal plan, which promotes 1-2 lb weight loss per week and includes a negative energy balance of [phone] kcal/d;Weight Gain: Understanding of general recommendations for a high calorie, high protein meal plan that promotes weight gain by distributing calorie intake throughout the day with the consumption for 4-5 meals, snacks, and/or supplements    Diabetes Yes    Intervention Provide education about signs/symptoms and action to take for hypo/hyperglycemia.;Provide education about proper nutrition, including hydration, and aerobic/resistive exercise prescription along with prescribed medications to achieve blood glucose in normal ranges: Fasting glucose 65-99 mg/dL    Expected Outcomes Short Term: Participant verbalizes understanding of the signs/symptoms and immediate care of hyper/hypoglycemia, proper foot care and importance of medication, aerobic/resistive exercise and nutrition plan for blood glucose control.    Personal  Goal Other Yes    Personal Goal Patient would like to  lose weight; increase his strenght and energy and learn how hard he can push himself and what he is able to do.    Intervention Patient will participate in the [facility] program with exercise and education 3 days/week and supplement with exercise at home.    Expected Outcomes Patient will complete the program meeting both personal and program goals.             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 He has multiple risk factors for CAD and is participating in the program for risk modification. He started today and did well. His initial weight was 307. His last A1C on file was 6.3% 02/26/21 down from 8.2% 4 years ago. His DM is managed with insulin and Jardiance. His personal goals for the program are to gain strength and energy; lose weight and learn what he can do and how far he can push himself. We will continue to monitor his progress as he works towards meeting these goals. Patient has completed 10 sessions. His current weight is 307.2 lbs maintained since last 30 day review. He is doing well in the program with consistent attendance and progressions. His last A1C was 6.3% on 2/24 trending down. He is on Insulin for DM controll. He saw Dr. Aundra Dubin 5/30 for routine follow up. No changes made. Patient's blood pressue are at goals. He had hypotension last week with resting b/p 90/50 and he said he did not feel good. We did not allow him to exercise. He stopped taking Entresto on his own and called cardiologist and told them he discontinued Entresto and he felt better  overall. They were okay with him staying off of the medication. His personal goals for the program continue to be to gain strenght and energy; lose weight; learn what he can do and how far he can push himself. We will continue to monitor his progress as he works towards meeting these goals.      Expected Outcomes Patient will complete the program meeting both personal and program goals. Patient will complete the program meeting both personal and program goals.               Core Components/Risk Factors/Patient Goals at Discharge (Final Review):   Goals and Risk Factor Review - 06/14/21 1250       Core Components/Risk Factors/Patient Goals Review   Personal Goals Review Weight Management/Obesity;Diabetes;Other    Review Patient has completed 10 sessions. His current weight is 307.2 lbs maintained since last 30 day review. He is doing well in the program with consistent attendance and progressions. His last A1C was 6.3% on 2/24 trending down. He is on Insulin for DM controll. He saw Dr. Aundra Dubin 5/30 for routine follow up. No changes made. Patient's blood pressue are at goals. He had hypotension last week with resting b/p 90/50 and he said he did not feel good. We did not allow him to exercise. He stopped taking Entresto on his own and called cardiologist and told them he discontinued Entresto and he felt better overall. They were okay with him staying off of the medication. His personal goals for the  program continue to be to gain strenght and energy; lose weight; learn what he can do and how far he can push himself. We will continue to monitor his progress as he works towards meeting these goals.    Expected Outcomes Patient will complete the program meeting both personal and program goals.             ITP Comments:   Comments: ITP REVIEW Pt is making expected progress toward Cardiac Rehab goals after completing 16 sessions. Recommend continued exercise, life style modification, education,  and increased stamina and strength.

## 2021-06-23 NOTE — Progress Notes (Signed)
Daily Session Note  Patient Details  Name: [name] MRN: [id number] Date of Birth: 1955-05-03 Referring Provider:   Flowsheet Row CARDIAC REHAB PHASE II ORIENTATION from 05/14/2021 in [place]  Referring Provider Dr. Alferd Patee       Encounter Date: 06/23/2021  Check In:  Session Check In - 06/23/21 1100       Check-In   Supervising physician immediately available to respond to emergencies [facility] MD immediately available    Physician(s) Dr Gasper Sells    Location [facility]    Staff Present Redge Gainer, BS, Exercise Physiologist;Marcene Laskowski Hassell Done, RN, Jennye Moccasin, RN, BSN    Virtual Visit No    Medication changes reported     No    Fall or balance concerns reported    No    Comments Patient has a history of falls and impaired balance.    Tobacco Cessation No Change    Warm-up and Cool-down Performed as group-led instruction    Resistance Training Performed Yes    VAD Patient? No    PAD/SET Patient? No      Pain Assessment   Currently in Pain? No/denies    Pain Score 0-No pain    Multiple Pain Sites No             Capillary Blood Glucose: No results found for this or any previous visit (from the past 24 hour(s)).    Social History   Tobacco Use  Smoking Status Former   Packs/day: 1.50   Years: 42.00   Total pack years: 63.00   Types: Cigarettes   Quit date: 09/03/2013   Years since quitting: 7.8  Smokeless Tobacco Former   Types: Chew  Tobacco Comments   We discussed the need to remain smoke free and to call me for help if he has the desire to smoke again.    Goals Met:  Independence with exercise equipment Exercise tolerated well No report of concerns or symptoms today Strength training completed today  Goals Unmet:  Not Applicable  Comments: Checkout at 1200.   Dr. Carlyle Dolly is Medical Director for [facility] Cardiac Rehab

## 2021-06-25 ENCOUNTER — Encounter (HOSPITAL_COMMUNITY)
Admission: RE | Admit: 2021-06-25 | Discharge: 2021-06-25 | Disposition: A | Discharge status: Home / Self Care | Payer: No Typology Code available for payment source | Source: Ambulatory Visit | Attending: Family Medicine | Admitting: Family Medicine

## 2021-06-25 DIAGNOSIS — I2510 Atherosclerotic heart disease of native coronary artery without angina pectoris: Secondary | ICD-10-CM | POA: Diagnosis not present

## 2021-06-25 DIAGNOSIS — I213 ST elevation (STEMI) myocardial infarction of unspecified site: Secondary | ICD-10-CM

## 2021-06-28 ENCOUNTER — Encounter (HOSPITAL_COMMUNITY)
Admission: RE | Admit: 2021-06-28 | Discharge: 2021-06-28 | Disposition: A | Discharge status: Home / Self Care | Payer: No Typology Code available for payment source | Source: Ambulatory Visit | Attending: Family Medicine | Admitting: Family Medicine

## 2021-06-28 DIAGNOSIS — I2510 Atherosclerotic heart disease of native coronary artery without angina pectoris: Secondary | ICD-10-CM | POA: Diagnosis not present

## 2021-06-28 DIAGNOSIS — I213 ST elevation (STEMI) myocardial infarction of unspecified site: Secondary | ICD-10-CM

## 2021-06-30 ENCOUNTER — Encounter (HOSPITAL_COMMUNITY)
Admission: RE | Admit: 2021-06-30 | Discharge: 2021-06-30 | Disposition: A | Discharge status: Home / Self Care | Payer: No Typology Code available for payment source | Source: Ambulatory Visit | Attending: Family Medicine | Admitting: Family Medicine

## 2021-06-30 DIAGNOSIS — I213 ST elevation (STEMI) myocardial infarction of unspecified site: Secondary | ICD-10-CM

## 2021-06-30 DIAGNOSIS — I2510 Atherosclerotic heart disease of native coronary artery without angina pectoris: Secondary | ICD-10-CM

## 2021-06-30 NOTE — Progress Notes (Signed)
Daily Session Note  Patient Details  Name: [name] MRN: [id number] Date of Birth: [birth year] Referring Provider:   Flowsheet Row CARDIAC REHAB PHASE II ORIENTATION from 05/14/2021 in [place]  Referring Provider Dr. Alferd Patee       Encounter Date: 06/30/2021  Check In:  Session Check In - 06/30/21 1059       Check-In   Supervising physician immediately available to respond to emergencies [facility] MD immediately available    Physician(s) Dr Harl Bowie    Location [facility]    Staff Present Redge Gainer, BS, Exercise Physiologist;Ryan Ogborn Wynetta Emery, RN, Joanette Gula, RN, BSN    Virtual Visit No    Medication changes reported     No    Fall or balance concerns reported    Yes    Comments Patient has a history of falls and impaired balance.    Tobacco Cessation No Change    Warm-up and Cool-down Performed as group-led instruction    Resistance Training Performed Yes    VAD Patient? No    PAD/SET Patient? No      Pain Assessment   Currently in Pain? No/denies    Pain Score 0-No pain    Multiple Pain Sites No             Capillary Blood Glucose: No results found for this or any previous visit (from the past 24 hour(s)).    Social History   Tobacco Use  Smoking Status Former   Packs/day: 1.50   Years: 42.00   Total pack years: 63.00   Types: Cigarettes   Quit date: 09/03/2013   Years since quitting: 7.8  Smokeless Tobacco Former   Types: Chew  Tobacco Comments   We discussed the need to remain smoke free and to call me for help if he has the desire to smoke again.    Goals Met:  Independence with exercise equipment Exercise tolerated well No report of concerns or symptoms today Strength training completed today  Goals Unmet:  Not Applicable  Comments: Check out 1200.   Dr. Carlyle Dolly is Medical Director for [facility] Cardiac Rehab

## 2021-07-02 ENCOUNTER — Encounter (HOSPITAL_COMMUNITY)
Admission: RE | Admit: 2021-07-02 | Discharge: 2021-07-02 | Disposition: A | Discharge status: Home / Self Care | Payer: No Typology Code available for payment source | Source: Ambulatory Visit | Attending: Family Medicine | Admitting: Family Medicine

## 2021-07-02 DIAGNOSIS — I213 ST elevation (STEMI) myocardial infarction of unspecified site: Secondary | ICD-10-CM

## 2021-07-02 DIAGNOSIS — I2510 Atherosclerotic heart disease of native coronary artery without angina pectoris: Secondary | ICD-10-CM

## 2021-07-02 NOTE — Progress Notes (Signed)
Daily Session Note  Patient Details  Name: PRINTICE HELLMER MRN: [id number] Date of Birth: [birth year] Referring Provider:   Flowsheet Row CARDIAC REHAB PHASE II ORIENTATION from 05/14/2021 in [place]  Referring Provider Dr. Alferd Patee       Encounter Date: 07/02/2021  Check In:  Session Check In - 07/02/21 1058       Check-In   Supervising physician immediately available to respond to emergencies [facility] MD immediately available    Physician(s) Gasper Sells    Location [facility]    Staff Present Aundra Dubin, RN, BSN;Heather Otho Ket, BS, Exercise Physiologist;Dalton Kris Mouton, MS, ACSM-CEP, Exercise Physiologist    Virtual Visit No    Medication changes reported     No    Fall or balance concerns reported    Yes    Comments Patient has a history of falls and impaired balance.    Tobacco Cessation No Change    Warm-up and Cool-down Performed as group-led instruction    Resistance Training Performed Yes    VAD Patient? No    PAD/SET Patient? No      Pain Assessment   Currently in Pain? No/denies    Pain Score 0-No pain    Multiple Pain Sites No             Capillary Blood Glucose: No results found for this or any previous visit (from the past 24 hour(s)).    Social History   Tobacco Use  Smoking Status Former   Packs/day: 1.50   Years: 42.00   Total pack years: 63.00   Types: Cigarettes   Quit date: 09/03/2013   Years since quitting: 7.8  Smokeless Tobacco Former   Types: Chew  Tobacco Comments   We discussed the need to remain smoke free and to call me for help if he has the desire to smoke again.    Goals Met:  Independence with exercise equipment Exercise tolerated well No report of concerns or symptoms today Strength training completed today  Goals Unmet:  Not Applicable  Comments: Check out 1200.   Dr. Carlyle Dolly is Medical Director for [facility] Cardiac Rehab

## 2021-07-05 ENCOUNTER — Encounter (HOSPITAL_COMMUNITY)
Admission: RE | Admit: 2021-07-05 | Discharge: 2021-07-05 | Disposition: A | Discharge status: Home / Self Care | Payer: No Typology Code available for payment source | Source: Ambulatory Visit | Attending: Family Medicine | Admitting: Family Medicine

## 2021-07-05 ENCOUNTER — Encounter (HOSPITAL_COMMUNITY): Payer: No Typology Code available for payment source

## 2021-07-05 VITALS — Wt 311.7 lb

## 2021-07-05 DIAGNOSIS — I213 ST elevation (STEMI) myocardial infarction of unspecified site: Secondary | ICD-10-CM | POA: Insufficient documentation

## 2021-07-05 DIAGNOSIS — I2510 Atherosclerotic heart disease of native coronary artery without angina pectoris: Secondary | ICD-10-CM | POA: Diagnosis present

## 2021-07-05 NOTE — Progress Notes (Signed)
Daily Session Note  Patient Details  Name: [name] MRN: [id number] Date of Birth: 1955/08/24 Referring Provider:   Flowsheet Row CARDIAC REHAB PHASE II ORIENTATION from 05/14/2021 in [place]  Referring Provider Dr. Alferd Patee       Encounter Date: 07/05/2021  Check In:  Session Check In - 07/05/21 1100       Check-In   Supervising physician immediately available to respond to emergencies [facility] MD immediately available    Physician(s) Dr. Johnsie Cancel    Location [facility]    Staff Present Geanie Cooley, RN;Heather Otho Ket, BS, Exercise Physiologist;Dalton Kris Mouton, MS, ACSM-CEP, Exercise Physiologist    Virtual Visit No    Medication changes reported     No    Fall or balance concerns reported    Yes    Comments Patient has a history of falls and impaired balance.    Tobacco Cessation No Change    Warm-up and Cool-down Performed as group-led instruction    Resistance Training Performed Yes    VAD Patient? No    PAD/SET Patient? No      Pain Assessment   Currently in Pain? No/denies    Pain Score 0-No pain    Multiple Pain Sites No             Capillary Blood Glucose: No results found for this or any previous visit (from the past 24 hour(s)).    Social History   Tobacco Use  Smoking Status Former   Packs/day: 1.50   Years: 42.00   Total pack years: 63.00   Types: Cigarettes   Quit date: 09/03/2013   Years since quitting: 7.8  Smokeless Tobacco Former   Types: Chew  Tobacco Comments   We discussed the need to remain smoke free and to call me for help if he has the desire to smoke again.    Goals Met:  Independence with exercise equipment Exercise tolerated well No report of concerns or symptoms today Strength training completed today  Goals Unmet:  Not Applicable  Comments: check out @ 12:00pm   Dr. Carlyle Dolly is Medical Director for [facility]

## 2021-07-07 ENCOUNTER — Encounter (HOSPITAL_COMMUNITY)
Admission: RE | Admit: 2021-07-07 | Discharge: 2021-07-07 | Disposition: A | Discharge status: Home / Self Care | Payer: No Typology Code available for payment source | Source: Ambulatory Visit | Attending: Family Medicine | Admitting: Family Medicine

## 2021-07-07 DIAGNOSIS — I213 ST elevation (STEMI) myocardial infarction of unspecified site: Secondary | ICD-10-CM | POA: Diagnosis not present

## 2021-07-07 DIAGNOSIS — I2510 Atherosclerotic heart disease of native coronary artery without angina pectoris: Secondary | ICD-10-CM

## 2021-07-07 NOTE — Progress Notes (Signed)
Daily Session Note  Patient Details  Name: [name] MRN: [id number] Date of Birth: 1955-06-25 Referring Provider:   Flowsheet Row CARDIAC REHAB PHASE II ORIENTATION from 05/14/2021 in [place]  Referring Provider Dr. Alferd Patee       Encounter Date: 07/07/2021  Check In:  Session Check In - 07/07/21 1100       Check-In   Supervising physician immediately available to respond to emergencies [facility] MD immediately available    Physician(s) Dr. Johnsie Cancel    Location [facility]    Staff Present Redge Gainer, BS, Exercise Physiologist;Taysia Rivere Wynetta Emery, RN, BSN    Virtual Visit No    Medication changes reported     No    Fall or balance concerns reported    Yes    Comments Patient has a history of falls and impaired balance.    Tobacco Cessation No Change    Warm-up and Cool-down Performed as group-led instruction    Resistance Training Performed Yes    VAD Patient? No    PAD/SET Patient? No      Pain Assessment   Currently in Pain? No/denies    Pain Score 0-No pain    Multiple Pain Sites No             Capillary Blood Glucose: No results found for this or any previous visit (from the past 24 hour(s)).    Social History   Tobacco Use  Smoking Status Former   Packs/day: 1.50   Years: 42.00   Total pack years: 63.00   Types: Cigarettes   Quit date: 09/03/2013   Years since quitting: 7.8  Smokeless Tobacco Former   Types: Chew  Tobacco Comments   We discussed the need to remain smoke free and to call me for help if he has the desire to smoke again.    Goals Met:  Independence with exercise equipment Exercise tolerated well No report of concerns or symptoms today Strength training completed today  Goals Unmet:  Not Applicable  Comments: Check out 1200.   Dr. Carlyle Dolly is Medical Director for [facility] Cardiac Rehab

## 2021-07-09 ENCOUNTER — Encounter (HOSPITAL_COMMUNITY)
Admission: RE | Admit: 2021-07-09 | Discharge: 2021-07-09 | Disposition: A | Discharge status: Home / Self Care | Payer: No Typology Code available for payment source | Source: Ambulatory Visit | Attending: Family Medicine | Admitting: Family Medicine

## 2021-07-09 DIAGNOSIS — I213 ST elevation (STEMI) myocardial infarction of unspecified site: Secondary | ICD-10-CM | POA: Diagnosis not present

## 2021-07-09 DIAGNOSIS — I2510 Atherosclerotic heart disease of native coronary artery without angina pectoris: Secondary | ICD-10-CM

## 2021-07-09 NOTE — Progress Notes (Signed)
Daily Session Note  Patient Details  Name: [name] MRN: [id number] Date of Birth: Jul 04, 1955 Referring Provider:   Flowsheet Row CARDIAC REHAB PHASE II ORIENTATION from 05/14/2021 in [place]  Referring Provider Dr. Alferd Patee       Encounter Date: 07/09/2021  Check In:  Session Check In - 07/09/21 1100       Check-In   Supervising physician immediately available to respond to emergencies [facility] MD immediately available    Physician(s) Dr. Marlou Porch    Location [facility]    Staff Present Redge Gainer, BS, Exercise Physiologist;Debra Wynetta Emery, RN, Bjorn Loser, MS, ACSM-CEP, Exercise Physiologist    Virtual Visit No    Medication changes reported     No    Fall or balance concerns reported    Yes    Comments Patient has a history of falls and impaired balance.    Tobacco Cessation No Change    Warm-up and Cool-down Performed as group-led instruction    Resistance Training Performed Yes    VAD Patient? No    PAD/SET Patient? No      Pain Assessment   Currently in Pain? No/denies    Pain Score 0-No pain    Multiple Pain Sites No             Capillary Blood Glucose: No results found for this or any previous visit (from the past 24 hour(s)).    Social History   Tobacco Use  Smoking Status Former   Packs/day: 1.50   Years: 42.00   Total pack years: 63.00   Types: Cigarettes   Quit date: 09/03/2013   Years since quitting: 7.8  Smokeless Tobacco Former   Types: Chew  Tobacco Comments   We discussed the need to remain smoke free and to call me for help if he has the desire to smoke again.    Goals Met:  Independence with exercise equipment Exercise tolerated well No report of concerns or symptoms today Strength training completed today  Goals Unmet:  Not Applicable  Comments: check out 1200   Dr. Carlyle Dolly is Medical Director for [facility]

## 2021-07-12 ENCOUNTER — Encounter (HOSPITAL_COMMUNITY)
Admission: RE | Admit: 2021-07-12 | Discharge: 2021-07-12 | Disposition: A | Discharge status: Home / Self Care | Payer: No Typology Code available for payment source | Source: Ambulatory Visit | Attending: Family Medicine | Admitting: Family Medicine

## 2021-07-12 DIAGNOSIS — I213 ST elevation (STEMI) myocardial infarction of unspecified site: Secondary | ICD-10-CM

## 2021-07-12 DIAGNOSIS — I2510 Atherosclerotic heart disease of native coronary artery without angina pectoris: Secondary | ICD-10-CM

## 2021-07-12 NOTE — Progress Notes (Signed)
Daily Session Note  Patient Details  Name: LISLE SKILLMAN MRN: [id number] Date of Birth: 06-13-1955 Referring Provider:   Flowsheet Row CARDIAC REHAB PHASE II ORIENTATION from 05/14/2021 in [place]  Referring Provider Dr. Alferd Patee       Encounter Date: 07/12/2021  Check In:  Session Check In - 07/12/21 1100       Check-In   Supervising physician immediately available to respond to emergencies [facility] MD immediately available    Physician(s) Dr. Harrington Challenger    Location [facility]    Staff Present Redge Gainer, BS, Exercise Physiologist;Debra Wynetta Emery, RN, Bjorn Loser, MS, ACSM-CEP, Exercise Physiologist    Virtual Visit No    Medication changes reported     No    Fall or balance concerns reported    Yes    Comments Patient has a history of falls and impaired balance.    Tobacco Cessation No Change    Warm-up and Cool-down Performed as group-led instruction    Resistance Training Performed Yes    VAD Patient? No    PAD/SET Patient? No      Pain Assessment   Currently in Pain? No/denies    Pain Score 0-No pain    Multiple Pain Sites No             Capillary Blood Glucose: No results found for this or any previous visit (from the past 24 hour(s)).    Social History   Tobacco Use  Smoking Status Former   Packs/day: 1.50   Years: 42.00   Total pack years: 63.00   Types: Cigarettes   Quit date: 09/03/2013   Years since quitting: 7.8  Smokeless Tobacco Former   Types: Chew  Tobacco Comments   We discussed the need to remain smoke free and to call me for help if he has the desire to smoke again.    Goals Met:  Independence with exercise equipment Exercise tolerated well No report of concerns or symptoms today Strength training completed today  Goals Unmet:  Not Applicable  Comments: check out 1200   Dr. Carlyle Dolly is Medical Director for [place]

## 2021-07-14 ENCOUNTER — Encounter (HOSPITAL_COMMUNITY)
Admission: RE | Admit: 2021-07-14 | Discharge: 2021-07-14 | Disposition: A | Discharge status: Home / Self Care | Payer: No Typology Code available for payment source | Source: Ambulatory Visit | Attending: Family Medicine | Admitting: Family Medicine

## 2021-07-14 DIAGNOSIS — I213 ST elevation (STEMI) myocardial infarction of unspecified site: Secondary | ICD-10-CM

## 2021-07-14 DIAGNOSIS — I2510 Atherosclerotic heart disease of native coronary artery without angina pectoris: Secondary | ICD-10-CM

## 2021-07-14 NOTE — Progress Notes (Signed)
Daily Session Note  Patient Details  Name: [name] MRN: [id number] Date of Birth: [birth year] Referring Provider:   Flowsheet Row CARDIAC REHAB PHASE II ORIENTATION from 05/14/2021 in [place]  Referring Provider Dr. Alferd Patee       Encounter Date: 07/14/2021  Check In:  Session Check In - 07/14/21 1059       Check-In   Supervising physician immediately available to respond to emergencies [facility] MD immediately available    Physician(s) Dr Audie Box    Location [facility]    Staff Present Redge Gainer, BS, Exercise Physiologist;Debra Wynetta Emery, RN, Joanette Gula, RN, BSN    Virtual Visit No    Medication changes reported     No    Fall or balance concerns reported    Yes    Comments Patient has a history of falls and impaired balance.    Tobacco Cessation No Change    Warm-up and Cool-down Performed as group-led instruction    Resistance Training Performed Yes    VAD Patient? No    PAD/SET Patient? No      Pain Assessment   Currently in Pain? No/denies    Pain Score 0-No pain    Multiple Pain Sites No             Capillary Blood Glucose: No results found for this or any previous visit (from the past 24 hour(s)).    Social History   Tobacco Use  Smoking Status Former   Packs/day: 1.50   Years: 42.00   Total pack years: 63.00   Types: Cigarettes   Quit date: 09/03/2013   Years since quitting: 7.8  Smokeless Tobacco Former   Types: Chew  Tobacco Comments   We discussed the need to remain smoke free and to call me for help if he has the desire to smoke again.    Goals Met:  Independence with exercise equipment Exercise tolerated well No report of concerns or symptoms today Strength training completed today  Goals Unmet:  Not Applicable  Comments: checkout at 1200.   Dr. Carlyle Dolly is Medical Director for [facility]

## 2021-07-16 ENCOUNTER — Encounter (HOSPITAL_COMMUNITY)
Admission: RE | Admit: 2021-07-16 | Discharge: 2021-07-16 | Disposition: A | Discharge status: Home / Self Care | Payer: No Typology Code available for payment source | Source: Ambulatory Visit | Attending: Family Medicine | Admitting: Family Medicine

## 2021-07-16 DIAGNOSIS — I213 ST elevation (STEMI) myocardial infarction of unspecified site: Secondary | ICD-10-CM

## 2021-07-16 DIAGNOSIS — I2510 Atherosclerotic heart disease of native coronary artery without angina pectoris: Secondary | ICD-10-CM

## 2021-07-16 NOTE — Progress Notes (Signed)
I have reviewed a Home Exercise Prescription with [name] . [name] is not currently exercising at home.  The patient was advised to walk or go swim laps 2 days a week for 30-45 minutes when not in cardiac rehab.  [name] and I discussed how to progress their exercise prescription.  The patient stated that their goals were build his endurance.  The patient stated that they understand the exercise prescription.  We reviewed exercise guidelines, target heart rate during exercise, RPE Scale, weather conditions, NTG use, endpoints for exercise, warmup and cool down.  Patient is encouraged to come to me with any questions. I will continue to follow up with the patient to assist them with progression and safety.

## 2021-07-16 NOTE — Progress Notes (Signed)
Daily Session Note  Patient Details  Name: [name] MRN: [id number] Date of Birth: 1955/05/02 Referring Provider:   Flowsheet Row CARDIAC REHAB PHASE II ORIENTATION from 05/14/2021 in [place]  Referring Provider Dr. Alferd Patee       Encounter Date: 07/16/2021  Check In:  Session Check In - 07/16/21 1100       Check-In   Supervising physician immediately available to respond to emergencies [facility] MD immediately available    Physician(s) Dr. Radford Pax    Location [facility]    Staff Present Hoy Register, MS, ACSM-CEP, Exercise Physiologist;Debra Wynetta Emery, RN, BSN;Heather Otho Ket, BS, Exercise Physiologist    Virtual Visit No    Medication changes reported     No    Fall or balance concerns reported    Yes    Comments Patient has a history of falls and impaired balance.    Tobacco Cessation No Change    Warm-up and Cool-down Performed as group-led instruction    Resistance Training Performed Yes    VAD Patient? No    PAD/SET Patient? No      Pain Assessment   Currently in Pain? No/denies    Pain Score 0-No pain    Multiple Pain Sites No             Capillary Blood Glucose: No results found for this or any previous visit (from the past 24 hour(s)).    Social History   Tobacco Use  Smoking Status Former   Packs/day: 1.50   Years: 42.00   Total pack years: 63.00   Types: Cigarettes   Quit date: 09/03/2013   Years since quitting: 7.8  Smokeless Tobacco Former   Types: Chew  Tobacco Comments   We discussed the need to remain smoke free and to call me for help if he has the desire to smoke again.    Goals Met:  Independence with exercise equipment Exercise tolerated well No report of concerns or symptoms today Strength training completed today  Goals Unmet:  Not Applicable  Comments: checkout time is 1200   Dr. Carlyle Dolly is Medical Director for [place]

## 2021-07-19 ENCOUNTER — Encounter (HOSPITAL_COMMUNITY)
Admission: RE | Admit: 2021-07-19 | Discharge: 2021-07-19 | Disposition: A | Discharge status: Home / Self Care | Payer: No Typology Code available for payment source | Source: Ambulatory Visit | Attending: Family Medicine | Admitting: Family Medicine

## 2021-07-19 VITALS — Wt 309.7 lb

## 2021-07-19 DIAGNOSIS — I213 ST elevation (STEMI) myocardial infarction of unspecified site: Secondary | ICD-10-CM

## 2021-07-19 DIAGNOSIS — I2510 Atherosclerotic heart disease of native coronary artery without angina pectoris: Secondary | ICD-10-CM

## 2021-07-19 NOTE — Progress Notes (Signed)
Daily Session Note  Patient Details  Name: [name] MRN: [id number] Date of Birth: 03/26/1955 Referring Provider:   Flowsheet Row CARDIAC REHAB PHASE II ORIENTATION from 05/14/2021 in [place]  Referring Provider Dr. Alferd Patee       Encounter Date: 07/19/2021  Check In:  Session Check In - 07/19/21 1100       Check-In   Supervising physician immediately available to respond to emergencies [facility] MD immediately available    Physician(s) Dr. Phineas Inches    Location [facility]    Staff Present Hoy Register, MS, ACSM-CEP, Exercise Physiologist;Debra Wynetta Emery, RN, BSN;Jalaya Sarver Otho Ket, BS, Exercise Physiologist    Virtual Visit No    Medication changes reported     No    Fall or balance concerns reported    Yes    Comments Patient has a history of falls and impaired balance.    Tobacco Cessation No Change    Warm-up and Cool-down Performed as group-led instruction    Resistance Training Performed Yes    VAD Patient? No    PAD/SET Patient? No      Pain Assessment   Currently in Pain? No/denies    Pain Score 0-No pain    Multiple Pain Sites No             Capillary Blood Glucose: No results found for this or any previous visit (from the past 24 hour(s)).    Social History   Tobacco Use  Smoking Status Former   Packs/day: 1.50   Years: 42.00   Total pack years: 63.00   Types: Cigarettes   Quit date: 09/03/2013   Years since quitting: 7.8  Smokeless Tobacco Former   Types: Chew  Tobacco Comments   We discussed the need to remain smoke free and to call me for help if he has the desire to smoke again.    Goals Met:  Independence with exercise equipment Exercise tolerated well No report of concerns or symptoms today Strength training completed today  Goals Unmet:  Not Applicable  Comments: check out 1200   Dr. Carlyle Dolly is Medical Director for [place]

## 2021-07-21 ENCOUNTER — Encounter (HOSPITAL_COMMUNITY): Payer: No Typology Code available for payment source

## 2021-07-21 NOTE — Progress Notes (Signed)
Cardiac Individual Treatment Plan  Patient Details  Name: [name] MRN: [id number] Date of Birth: 1955/12/11 Referring Provider:   Flowsheet Row CARDIAC REHAB PHASE II ORIENTATION from 05/14/2021 in [place]  Referring Provider Dr. Alferd Patee       Initial Encounter Date:  Flowsheet Row CARDIAC REHAB PHASE II ORIENTATION from 05/14/2021 in [place]  Date 05/14/21       Visit Diagnosis: ST elevation myocardial infarction (STEMI), unspecified artery ([place])  Atherosclerosis of native coronary artery of native heart without angina pectoris  Patient's Home Medications on Admission:  Current Outpatient Medications:    atorvastatin (LIPITOR) 80 MG tablet, Take 80 mg by mouth at bedtime., Disp: , Rfl:    carboxymethylcellulose (REFRESH PLUS) 0.5 % SOLN, Place 1 drop into both eyes 3 (three) times daily as needed (dry eyes)., Disp: , Rfl:    carvedilol (COREG) 6.25 MG tablet, Take 1 tablet (6.25 mg total) by mouth 2 (two) times daily with a meal., Disp: 180 tablet, Rfl: 3   clopidogrel (PLAVIX) 75 MG tablet, Take 1 tablet (75 mg total) by mouth daily., Disp: 90 tablet, Rfl: 3   cyclobenzaprine (FLEXERIL) 5 MG tablet, Take 5 mg by mouth as needed for muscle spasms., Disp: , Rfl:    diclofenac sodium (VOLTAREN) 1 % GEL, Apply 4 g topically 3 (three) times daily as needed (pain)., Disp: 350 g, Rfl: 0   docusate sodium (COLACE) 100 MG capsule, Take 1 capsule (100 mg total) by mouth 2 (two) times daily., Disp: 28 capsule, Rfl: 0   empagliflozin (JARDIANCE) 25 MG TABS tablet, Take 12.5 mg by mouth daily., Disp: , Rfl:    famotidine (PEPCID) 40 MG tablet, Take 40 mg by mouth daily as needed for heartburn or indigestion., Disp: , Rfl:    furosemide (LASIX) 20 MG tablet, Take 1 tablet (20 mg total) by mouth daily as needed for fluid or edema., Disp: 30 tablet, Rfl: 2   gabapentin (NEURONTIN) 400 MG capsule, Take 400-800 mg by mouth See admin  instructions. Takes '400mg'$  three times a day as needed for nerve pain. Take '800mg'$  every night at bedtime., Disp: , Rfl:    icosapent Ethyl (VASCEPA) 1 g capsule, Take 2 capsules (2 g total) by mouth 2 (two) times daily., Disp: 120 capsule, Rfl: 11   insulin aspart (NOVOLOG) 100 UNIT/ML injection, Inject 35-50 Units into the skin 3 (three) times daily before meals. 35 units in AM, 35 units at [place], and 50 units at supper, Disp: , Rfl:    insulin glargine (LANTUS) 100 UNIT/ML injection, Inject 80 Units into the skin at bedtime., Disp: , Rfl:    loratadine (CLARITIN) 10 MG tablet, Take 10 mg by mouth daily., Disp: , Rfl:    Multiple Vitamins-Minerals (MULTIVITAMIN WITH MINERALS) tablet, Take 1 tablet by mouth daily., Disp: , Rfl:    nitroGLYCERIN (NITROSTAT) 0.4 MG SL tablet, Place 1 tablet (0.4 mg total) under the tongue every 5 (five) minutes x 3 doses as needed for chest pain. Call MD if you need two or more doses, Disp: 30 tablet, Rfl: 12   pantoprazole (PROTONIX) 40 MG tablet, Take 40 mg by mouth 2 (two) times daily before a meal., Disp: , Rfl:    Polyethyl Glycol-Propyl Glycol (SYSTANE) 0.4-0.3 % GEL ophthalmic gel, Place 1 application. into both eyes every 6 (six) hours as needed (dry eyes)., Disp: , Rfl:    Semaglutide (OZEMPIC, 0.25 OR 0.5 MG/DOSE, [place]), Inject into the skin., Disp: ,  Rfl:   Past Medical History: 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    Labs: Review Flowsheet  More data exists      Latest Ref Rng & Units 05/24/2020 05/25/2020 05/26/2020 02/26/2021 06/01/2021  Labs for ITP Cardiac and Pulmonary Rehab  Cholestrol 0 - 200 mg/dL - - - 156  154   LDL (calc) 0 - 99 mg/dL - - - 54  71   HDL-C >40 mg/dL - - - 31  31   Trlycerides <150 mg/dL - - - 357  262   Hemoglobin A1c 4.8 - 5.6 % - - - 6.3  -  PH, Arterial 7.350 - 7.450 7.559  - - - -  PCO2 arterial 32.0 - 48.0 mmHg 32.3  - - - -  Bicarbonate 20.0 - 28.0 mmol/L 28.7  - - - -  TCO2 22 - 32 mmol/L 30  - - - -  O2 Saturation % 62.9  97.0  57.4  74.8  - -    Capillary Blood Glucose: Lab Results  Component Value Date   GLUCAP 104 (H) 06/02/2021   GLUCAP 115 (H) 06/10/2020   GLUCAP 290 (H) 06/09/2020   GLUCAP 217 (H) 06/09/2020   GLUCAP 159 (H) 06/09/2020     Exercise Target Goals: Exercise Program Goal: Individual exercise prescription set using results from initial 6 min walk test and THRR while considering  patient's activity barriers and safety.   Exercise Prescription Goal: Starting with aerobic activity 30 plus minutes a day, 3 days per week for initial exercise prescription. Provide home exercise prescription and guidelines that participant acknowledges understanding prior to discharge.  Activity Barriers & Risk Stratification:  Activity Barriers & Cardiac Risk Stratification - 05/14/21 1235       Activity Barriers & Cardiac Risk Stratification   Activity Barriers Left Hip Replacement;Right Hip Replacement;Left Knee Replacement;Joint Problems;Arthritis;Neck/Spine Problems;History of Falls    Cardiac Risk Stratification High             6 Minute Walk:  6 Minute Walk     Row Name 05/14/21 1402          6 Minute Walk   Phase Initial     Distance 1200 feet     Walk Time 6 minutes     # of Rest Breaks 0     MPH 2.27     METS 2.18     RPE 12     VO2 Peak 7.64  Symptoms Yes (comment)     Comments L hip pain due to a recent fall and R knee pain     Resting HR 68 bpm     Resting BP 112/60     Resting Oxygen Saturation  94 %     Exercise Oxygen Saturation  during 6 min walk 98 %     Max Ex. HR 109 bpm     Max Ex. BP 140/70     2 Minute Post BP 130/66              Oxygen Initial Assessment:   Oxygen Re-Evaluation:   Oxygen Discharge (Final Oxygen Re-Evaluation):   Initial Exercise Prescription:  Initial Exercise Prescription - 05/14/21 1400       Date of Initial Exercise RX and Referring Provider   Date 05/14/21    Referring Provider Dr. Alferd Patee    Expected Discharge Date 08/04/21      NuStep   Level 1    SPM 60    Minutes 22      Arm Ergometer   Level 1    RPM 50    Minutes 17      Prescription Details   Frequency (times per week) 3    Duration Progress to 30 minutes of continuous aerobic without signs/symptoms of physical distress      Intensity   THRR 40-80% of Max Heartrate 61-122    Ratings of Perceived Exertion 11-13      Resistance Training   Training Prescription Yes    Weight 3    Reps 10-15             Perform Capillary Blood Glucose checks as needed.  Exercise Prescription Changes:   Exercise Prescription Changes     Row Name 05/24/21 1400 06/04/21 1200 06/21/21 1300 07/05/21 1200 07/16/21 1100     Response to Exercise   Blood Pressure (Admit) 136/60 110/50 120/60 130/76 --   Blood Pressure (Exercise) 124/64 130/70 142/78 144/74 --   Blood Pressure (Exit) 130/60 102/60 108/70 120/58 --   Heart Rate (Admit) 85 bpm 85 bpm 74 bpm 98 bpm --   Heart Rate (Exercise) 115 bpm 120 bpm 100 bpm 118 bpm --   Heart Rate (Exit) 94 bpm 105 bpm 82 bpm 100 bpm --   Rating of Perceived Exertion (Exercise) '11 11 11 12 '$ --   Duration  Continue with 30 min of aerobic exercise without signs/symptoms of physical distress. Continue with 30 min of aerobic exercise without signs/symptoms of physical distress. Continue with 30 min of aerobic exercise without signs/symptoms of physical distress. Continue with 30 min of aerobic exercise without signs/symptoms of physical distress. --   Intensity THRR unchanged THRR unchanged THRR unchanged THRR unchanged --     Progression   Progression Continue to progress workloads to maintain intensity without signs/symptoms of physical distress. Continue to progress workloads to maintain intensity without signs/symptoms of physical distress. Continue to progress workloads to maintain intensity without signs/symptoms of physical distress. Continue to progress workloads to maintain intensity without signs/symptoms of physical distress. --     Horticulturist, commercial Prescription Yes Yes Yes Yes --   Weight '5 5 5 5 '$ --   Reps 10-15 10-15 10-15 10-15 --   Time 10 Minutes 10 Minutes 10 Minutes 10 Minutes --     NuStep   Level '1 2 4 4 '$ --   SPM 104 106 95 71 --   Minutes '22 22 22 22 '$ --  METs 2.13 2.26 2.27 3.04 --     Arm Ergometer   Level '1 2 4 4 '$ --   RPM 54 50 73 82 --   Minutes '17 17 17 17 '$ --   METs 1.49 1.64 2.85 3.94 --     Home Exercise Plan   Plans to continue exercise at -- -- -- -- Home (comment)   Frequency -- -- -- -- Add 2 additional days to program exercise sessions.   Initial Home Exercises Provided -- -- -- -- 07/16/21    Row Name 07/19/21 1400             Response to Exercise   Blood Pressure (Admit) 92/56       Blood Pressure (Exercise) 130/58       Blood Pressure (Exit) 100/50       Heart Rate (Admit) 79 bpm       Heart Rate (Exercise) 111 bpm       Heart Rate (Exit) 104 bpm       Rating of Perceived Exertion (Exercise) 13       Duration Continue with 30 min of aerobic exercise without signs/symptoms of physical distress.       Intensity THRR unchanged          Progression   Progression Continue to progress workloads to maintain intensity without signs/symptoms of physical distress.         Resistance Training   Training Prescription Yes       Weight 5       Reps 10-15       Time 10 Minutes         NuStep   Level 4       SPM 101       Minutes 22       METs 2.49         Arm Ergometer   Level 4       RPM 72       Minutes 17       METs 3.31                Exercise Comments:   Exercise Comments     Row Name 07/16/21 1122           Exercise Comments home exercise reviewed                Exercise Goals and Review:   Exercise Goals     Row Name 05/14/21 1406 05/24/21 1428 06/21/21 1347 07/19/21 1408       Exercise Goals   Increase Physical Activity Yes Yes Yes Yes    Intervention Provide advice, education, support and counseling about physical activity/exercise needs.;Develop an individualized exercise prescription for aerobic and resistive training based on initial evaluation findings, risk stratification, comorbidities and participant's personal goals. Provide advice, education, support and counseling about physical activity/exercise needs.;Develop an individualized exercise prescription for aerobic and resistive training based on initial evaluation findings, risk stratification, comorbidities and participant's personal goals. Provide advice, education, support and counseling about physical activity/exercise needs.;Develop an individualized exercise prescription for aerobic and resistive training based on initial evaluation findings, risk stratification, comorbidities and participant's personal goals. Provide advice, education, support and counseling about physical activity/exercise needs.;Develop an individualized exercise prescription for aerobic and resistive training based on initial evaluation findings, risk stratification, comorbidities and participant's personal goals.    Expected Outcomes Short Term: Attend  rehab on a regular basis to increase amount of physical activity.;Long Term: Add in  home exercise to make exercise part of routine and to increase amount of physical activity.;Long Term: Exercising regularly at least 3-5 days a week. Short Term: Attend rehab on a regular basis to increase amount of physical activity.;Long Term: Add in home exercise to make exercise part of routine and to increase amount of physical activity.;Long Term: Exercising regularly at least 3-5 days a week. Short Term: Attend rehab on a regular basis to increase amount of physical activity.;Long Term: Add in home exercise to make exercise part of routine and to increase amount of physical activity.;Long Term: Exercising regularly at least 3-5 days a week. Short Term: Attend rehab on a regular basis to increase amount of physical activity.;Long Term: Add in home exercise to make exercise part of routine and to increase amount of physical activity.;Long Term: Exercising regularly at least 3-5 days a week.    Increase Strength and Stamina Yes Yes Yes Yes    Intervention Provide advice, education, support and counseling about physical activity/exercise needs.;Develop an individualized exercise prescription for aerobic and resistive training based on initial evaluation findings, risk stratification, comorbidities and participant's personal goals. Provide advice, education, support and counseling about physical activity/exercise needs.;Develop an individualized exercise prescription for aerobic and resistive training based on initial evaluation findings, risk stratification, comorbidities and participant's personal goals. Provide advice, education, support and counseling about physical activity/exercise needs.;Develop an individualized exercise prescription for aerobic and resistive training based on initial evaluation findings, risk stratification, comorbidities and participant's personal goals. Provide advice, education, support and counseling  about physical activity/exercise needs.;Develop an individualized exercise prescription for aerobic and resistive training based on initial evaluation findings, risk stratification, comorbidities and participant's personal goals.    Expected Outcomes Short Term: Increase workloads from initial exercise prescription for resistance, speed, and METs.;Short Term: Perform resistance training exercises routinely during rehab and add in resistance training at home;Long Term: Improve cardiorespiratory fitness, muscular endurance and strength as measured by increased METs and functional capacity (6MWT) Short Term: Increase workloads from initial exercise prescription for resistance, speed, and METs.;Short Term: Perform resistance training exercises routinely during rehab and add in resistance training at home;Long Term: Improve cardiorespiratory fitness, muscular endurance and strength as measured by increased METs and functional capacity (6MWT) Short Term: Increase workloads from initial exercise prescription for resistance, speed, and METs.;Short Term: Perform resistance training exercises routinely during rehab and add in resistance training at home;Long Term: Improve cardiorespiratory fitness, muscular endurance and strength as measured by increased METs and functional capacity (6MWT) Short Term: Increase workloads from initial exercise prescription for resistance, speed, and METs.;Short Term: Perform resistance training exercises routinely during rehab and add in resistance training at home;Long Term: Improve cardiorespiratory fitness, muscular endurance and strength as measured by increased METs and functional capacity (6MWT)    Able to understand and use rate of perceived exertion (RPE) scale Yes Yes Yes Yes    Intervention Provide education and explanation on how to use RPE scale Provide education and explanation on how to use RPE scale Provide education and explanation on how to use RPE scale Provide education  and explanation on how to use RPE scale    Expected Outcomes Short Term: Able to use RPE daily in rehab to express subjective intensity level;Long Term:  Able to use RPE to guide intensity level when exercising independently Short Term: Able to use RPE daily in rehab to express subjective intensity level;Long Term:  Able to use RPE to guide intensity level when exercising independently Short Term: Able to use RPE  daily in rehab to express subjective intensity level;Long Term:  Able to use RPE to guide intensity level when exercising independently Short Term: Able to use RPE daily in rehab to express subjective intensity level;Long Term:  Able to use RPE to guide intensity level when exercising independently    Knowledge and understanding of Target Heart Rate Range (THRR) Yes Yes Yes Yes    Intervention Provide education and explanation of THRR including how the numbers were predicted and where they are located for reference Provide education and explanation of THRR including how the numbers were predicted and where they are located for reference Provide education and explanation of THRR including how the numbers were predicted and where they are located for reference Provide education and explanation of THRR including how the numbers were predicted and where they are located for reference    Expected Outcomes Short Term: Able to state/look up THRR;Long Term: Able to use THRR to govern intensity when exercising independently;Short Term: Able to use daily as guideline for intensity in rehab Short Term: Able to state/look up THRR;Long Term: Able to use THRR to govern intensity when exercising independently;Short Term: Able to use daily as guideline for intensity in rehab Short Term: Able to state/look up THRR;Long Term: Able to use THRR to govern intensity when exercising independently;Short Term: Able to use daily as guideline for intensity in rehab Short Term: Able to state/look up THRR;Long Term: Able to use  THRR to govern intensity when exercising independently;Short Term: Able to use daily as guideline for intensity in rehab    Able to check pulse independently Yes Yes Yes Yes    Intervention Provide education and demonstration on how to check pulse in carotid and radial arteries.;Review the importance of being able to check your own pulse for safety during independent exercise Provide education and demonstration on how to check pulse in carotid and radial arteries.;Review the importance of being able to check your own pulse for safety during independent exercise Provide education and demonstration on how to check pulse in carotid and radial arteries.;Review the importance of being able to check your own pulse for safety during independent exercise Provide education and demonstration on how to check pulse in carotid and radial arteries.;Review the importance of being able to check your own pulse for safety during independent exercise    Expected Outcomes Short Term: Able to explain why pulse checking is important during independent exercise;Long Term: Able to check pulse independently and accurately Short Term: Able to explain why pulse checking is important during independent exercise;Long Term: Able to check pulse independently and accurately Short Term: Able to explain why pulse checking is important during independent exercise;Long Term: Able to check pulse independently and accurately Short Term: Able to explain why pulse checking is important during independent exercise;Long Term: Able to check pulse independently and accurately    Understanding of Exercise Prescription Yes Yes Yes Yes    Intervention Provide education, explanation, and written materials on patient's individual exercise prescription Provide education, explanation, and written materials on patient's individual exercise prescription Provide education, explanation, and written materials on patient's individual exercise prescription Provide  education, explanation, and written materials on patient's individual exercise prescription    Expected Outcomes Short Term: Able to explain program exercise prescription;Long Term: Able to explain home exercise prescription to exercise independently Short Term: Able to explain program exercise prescription;Long Term: Able to explain home exercise prescription to exercise independently Short Term: Able to explain program exercise prescription;Long Term: Able to explain  home exercise prescription to exercise independently Short Term: Able to explain program exercise prescription;Long Term: Able to explain home exercise prescription to exercise independently             Exercise Goals Re-Evaluation :  Exercise Goals Re-Evaluation     Row Name 05/24/21 1429 06/21/21 1347 07/19/21 1409         Exercise Goal Re-Evaluation   Exercise Goals Review Increase Physical Activity;Increase Strength and Stamina;Able to understand and use rate of perceived exertion (RPE) scale;Knowledge and understanding of Target Heart Rate Range (THRR);Able to check pulse independently;Understanding of Exercise Prescription Increase Strength and Stamina;Able to understand and use rate of perceived exertion (RPE) scale;Able to check pulse independently;Understanding of Exercise Prescription;Knowledge and understanding of Target Heart Rate Range (THRR);Increase Physical Activity Increase Physical Activity;Increase Strength and Stamina;Able to understand and use rate of perceived exertion (RPE) scale;Able to check pulse independently;Knowledge and understanding of Target Heart Rate Range (THRR);Understanding of Exercise Prescription     Comments Pt has completed 5 sessions of cardiac rehab. He is motivated when in class ans eager to progress his workload. He pushed himself the first week of class and went over his THR and was asked to slow down. He stated that level 1 feels to easy but his HR increased when he tried to increase the  level. He is currently exercising at 2.13 METs on the stepper. Will continue to monitor and progress as able. Pt has completed 14 sessions of cardiac rehab. He is motivated in class and eager to progress his workload. He states that he feels like he is not working hard enough even through his workload is level 4. He complains about his L knee hurting the day after exercise but does not take advice to use arm ergometer without the foot pedals. He is currently exercising at 2.85 METs on the arm ergometer. Will continue to monitor and progress as able. Pt has completed 26 sessions of cardiac rehab. He is eagar to progress and increase his workload during class. He states that he does not feel like he is working hard enough unless he is at a high level and pushing himself. He is on level 4 on both sets and his HR is within the target. He is currently exercising at 3.31 METs on the AE. Will continue to monitor and progress as able.     Expected Outcomes Through exercise at rehab and at home, the patient will meet their stated goals. Through exercise at rehab and at home, the patient will meet their stated goals. Through exercise at rehab and at home, the patient will meet their stated goals.               Discharge Exercise Prescription (Final Exercise Prescription Changes):  Exercise Prescription Changes - 07/19/21 1400       Response to Exercise   Blood Pressure (Admit) 92/56    Blood Pressure (Exercise) 130/58    Blood Pressure (Exit) 100/50    Heart Rate (Admit) 79 bpm    Heart Rate (Exercise) 111 bpm    Heart Rate (Exit) 104 bpm    Rating of Perceived Exertion (Exercise) 13    Duration Continue with 30 min of aerobic exercise without signs/symptoms of physical distress.    Intensity THRR unchanged      Progression   Progression Continue to progress workloads to maintain intensity without signs/symptoms of physical distress.      Resistance Training   Training Prescription Yes    Weight  5    Reps 10-15    Time 10 Minutes      NuStep   Level 4    SPM 101    Minutes 22    METs 2.49      Arm Ergometer   Level 4    RPM 72    Minutes 17    METs 3.31             Nutrition:  Target Goals: Understanding of nutrition guidelines, daily intake of sodium '1500mg'$ , cholesterol '200mg'$ , calories 30% from fat and 7% or less from saturated fats, daily to have 5 or more servings of fruits and vegetables.  Biometrics:  Pre Biometrics - 05/14/21 1407       Pre Biometrics   Height 6' (1.829 m)    Weight 139.3 kg    Waist Circumference 54 inches    Hip Circumference 51 inches    Waist to Hip Ratio 1.06 %    BMI (Calculated) 41.64    Triceps Skinfold 25 mm    % Body Fat 40.9 %    Grip Strength 19.4 kg    Flexibility 0 in    Single Leg Stand 8 seconds              Nutrition Therapy Plan and Nutrition Goals:  Nutrition Therapy & Goals - 05/14/21 1332       Personal Nutrition Goals   Comments Patient scored 69 on his diet assessment. Patient says he follows a low fat, low carb diet. Handout provided and explained regarding healthier choices and DM information. Patient verbalized understanding. We offer 2 educational sessions on heart healthy nutrition with handouts and assistance with RD referral if patient is interested.      Intervention Plan   Intervention Nutrition handout(s) given to patient.    Expected Outcomes Short Term Goal: Understand basic principles of dietary content, such as calories, fat, sodium, cholesterol and nutrients.             Nutrition Assessments:  Nutrition Assessments - 05/14/21 1332       MEDFICTS Scores   Pre Score 69            MEDIFICTS Score Key: ?70 Need to make dietary changes  40-70 Heart Healthy Diet ? 40 Therapeutic Level Cholesterol Diet   Picture Your Plate Scores: <44 Unhealthy dietary pattern with much room for improvement. 41-50 Dietary pattern unlikely to meet recommendations for good health and  room for improvement. 51-60 More healthful dietary pattern, with some room for improvement.  >60 Healthy dietary pattern, although there may be some specific behaviors that could be improved.    Nutrition Goals Re-Evaluation:   Nutrition Goals Discharge (Final Nutrition Goals Re-Evaluation):   Psychosocial: Target Goals: Acknowledge presence or absence of significant depression and/or stress, maximize coping skills, provide positive support system. Participant is able to verbalize types and ability to use techniques and skills needed for reducing stress and depression.  Initial Review & Psychosocial Screening:  Initial Psych Review & Screening - 05/14/21 1338       Initial Review   Current issues with None Identified   Patient has PTSD.     Family Dynamics   Good Support System? Yes      Barriers   Psychosocial barriers to participate in program There are no identifiable barriers or psychosocial needs.      Screening Interventions   Interventions Encouraged to exercise    Expected Outcomes Short Term goal: Utilizing psychosocial counselor, staff  and physician to assist with identification of specific Stressors or current issues interfering with healing process. Setting desired goal for each stressor or current issue identified.             Quality of Life Scores:  Quality of Life - 05/14/21 1408       Quality of Life   Select Quality of Life      Quality of Life Scores   Health/Function Pre 15.23 %    Socioeconomic Pre 18.75 %    Psych/Spiritual Pre 16.21 %    Family Pre 25.2 %    GLOBAL Pre 17.59 %            Scores of 19 and below usually indicate a poorer quality of life in these areas.  A difference of  2-3 points is a clinically meaningful difference.  A difference of 2-3 points in the total score of the Quality of Life Index has been associated with significant improvement in overall quality of life, self-image, physical symptoms, and general health in  studies assessing change in quality of life.  PHQ-9: Review Flowsheet       05/14/2021 09/29/2014 08/30/2012  Depression screen PHQ 2/9  Decreased Interest 0 0 0  Down, Depressed, Hopeless 1 0 0  PHQ - 2 Score 1 0 0  Altered sleeping 1 - -  Tired, decreased energy 1 - -  Change in appetite 1 - -  Feeling bad or failure about yourself  1 - -  Trouble concentrating 0 - -  Moving slowly or fidgety/restless 0 - -  Suicidal thoughts 0 - -  PHQ-9 Score 5 - -  Difficult doing work/chores Not difficult at all - -   Interpretation of Total Score  Total Score Depression Severity:  1-4 = Minimal depression, 5-9 = Mild depression, 10-14 = Moderate depression, 15-19 = Moderately severe depression, 20-27 = Severe depression   Psychosocial Evaluation and Intervention:  Psychosocial Evaluation - 05/14/21 1339       Psychosocial Evaluation & Interventions   Interventions Stress management education;Relaxation education;Encouraged to exercise with the program and follow exercise prescription    Comments Patient has no psychosocial barriers identified at his orientation visit. His initial PHQ-9 socre was 5 based on his not being able to do what he wants to do due to his chronic generalized OA and fear since his STEMI. He is a retired Curator. He had a STEMI 5/22 and went into cardiac arrest at home. His wife started CPR and this was continued for 64 minutes per ED note until ROSC was achieved in the ED at [facility] He said he went to heaven 3 times. He shared vivid details about what he saw and who he talked to. He served in [employer] in [place] from PTSD. He does not feel like he has anxiety or depression but he does fear that he could die or go into cardiac arrest again. He says he thinks about it a lot. He says he is not afraid to die but wants to be here longer to see his grandchildren grow. He lives with his wife of many years and they have 3 children and multiple  grandchildren. He names his wife as his support person but says he is very close to all of his family and he really enjoys his grandchildren.  He says he has a bike at home and is a member at the [facility] but wants to do the program to be monitored  and learn how much he can and should do and how far he can push himself. He is ready to start.    Expected Outcomes Patient will continue to have no psychosocial barriers or issues identified.    Continue Psychosocial Services  No Follow up required             Psychosocial Re-Evaluation:  Psychosocial Re-Evaluation     [place] Name 05/17/21 1237 06/14/21 1249 07/12/21 1343         Psychosocial Re-Evaluation   Current issues with None Identified  PTSD None Identified None Identified     Comments Patient is new to the program starting today. He continues to have no psychosocial barriers identified and his PTSD is managed. He seemed to enjoy his first session and demonstrates an interest in improving his health. We will continue to manage. Patient has completed 10. He continues to have no psychosocial barriers identified and his PTSD is managed. He continues to enjoy the program and demonstrates an interest in improving his health. We will continue to monitor. Patient has completed 22 sessions. He continues to have no psychosocial barriers identified and his PTSD is managed. He continues to enjoy the program and demonstrates an interest in improving his health. We will continue to monitor.     Expected Outcomes Patient will complete the program meeting both personal and program goals. Patient will complete the program meeting both personal and program goals. Patient will complete the program meeting both personal and program goals.     Interventions Stress management education;Relaxation education;Encouraged to attend Cardiac Rehabilitation for the exercise Stress management education;Relaxation education;Encouraged to attend Cardiac Rehabilitation for the  exercise Stress management education;Relaxation education;Encouraged to attend Cardiac Rehabilitation for the exercise     Continue Psychosocial Services  No Follow up required No Follow up required No Follow up required              Psychosocial Discharge (Final Psychosocial Re-Evaluation):  Psychosocial Re-Evaluation - 07/12/21 1343       Psychosocial Re-Evaluation   Current issues with None Identified    Comments Patient has completed 22 sessions. He continues to have no psychosocial barriers identified and his PTSD is managed. He continues to enjoy the program and demonstrates an interest in improving his health. We will continue to monitor.    Expected Outcomes Patient will complete the program meeting both personal and program goals.    Interventions Stress management education;Relaxation education;Encouraged to attend Cardiac Rehabilitation for the exercise    Continue Psychosocial Services  No Follow up required             Vocational Rehabilitation: Provide vocational rehab assistance to qualifying candidates.   Vocational Rehab Evaluation & Intervention:  Vocational Rehab - 05/14/21 1336       Initial Vocational Rehab Evaluation & Intervention   Assessment shows need for Vocational Rehabilitation No      Vocational Rehab Re-Evaulation   Comments Patient is retired and does not need vocational rehab.             Education: Education Goals: Education classes will be provided on a weekly basis, covering required topics. Participant will state understanding/return demonstration of topics presented.  Learning Barriers/Preferences:  Learning Barriers/Preferences - 05/14/21 1334       Learning Barriers/Preferences   Learning Barriers Sight    Learning Preferences Written Material             Education Topics: Hypertension, Hypertension Reduction -Define heart disease and high  blood pressure. Discus how high blood pressure affects the body and ways to  reduce high blood pressure. Flowsheet Row CARDIAC REHAB PHASE II EXERCISE from 07/14/2021 in [place]  Date 07/07/21  Educator [place]  Instruction Review Code 1- Verbalizes Understanding       Exercise and Your Heart -Discuss why it is important to exercise, the FITT principles of exercise, normal and abnormal responses to exercise, and how to exercise safely. Flowsheet Row CARDIAC REHAB PHASE II EXERCISE from 07/14/2021 in [place]  Date 07/14/21  Educator [place]  Instruction Review Code 1- Verbalizes Understanding       Angina -Discuss definition of angina, causes of angina, treatment of angina, and how to decrease risk of having angina.   Cardiac Medications -Review what the following cardiac medications are used for, how they affect the body, and side effects that may occur when taking the medications.  Medications include Aspirin, Beta blockers, calcium channel blockers, ACE Inhibitors, angiotensin receptor blockers, diuretics, digoxin, and antihyperlipidemics.   Congestive Heart Failure -Discuss the definition of CHF, how to live with CHF, the signs and symptoms of CHF, and how keep track of weight and sodium intake.   Heart Disease and Intimacy -Discus the effect sexual activity has on the heart, how changes occur during intimacy as we age, and safety during sexual activity.   Smoking Cessation / COPD -Discuss different methods to quit smoking, the health benefits of quitting smoking, and the definition of COPD. Flowsheet Row CARDIAC REHAB PHASE II EXERCISE from 07/14/2021 in [place]  Date 05/19/21  Educator [place]  Instruction Review Code 1- Verbalizes Understanding       Nutrition I: Fats -Discuss the types of cholesterol, what cholesterol does to the heart, and how cholesterol levels can be controlled. Flowsheet Row CARDIAC REHAB PHASE II EXERCISE from 07/14/2021 in [place]   Date 05/26/21  Educator [place]  Instruction Review Code 1- Verbalizes Understanding       Nutrition II: Labels -Discuss the different components of food labels and how to read food label [place] from 07/14/2021 in [place]  Date 06/09/21  Educator [place]  Instruction Review Code 1- Verbalizes Understanding       Heart Parts/Heart Disease and PAD -Discuss the anatomy of the heart, the pathway of blood circulation through the heart, and these are affected by heart disease. Flowsheet Row CARDIAC REHAB PHASE II EXERCISE from 07/14/2021 in [place]  Date 06/16/21  Educator [place]  Instruction Review Code 2- Demonstrated Understanding       Stress I: Signs and Symptoms -Discuss the causes of stress, how stress may lead to anxiety and depression, and ways to limit stress. Flowsheet Row CARDIAC REHAB PHASE II EXERCISE from 07/14/2021 in [place]  Date 06/23/21  Educator [place]  Instruction Review Code 1- Verbalizes Understanding       Stress II: Relaxation -Discuss different types of relaxation techniques to limit stress. Flowsheet Row CARDIAC REHAB PHASE II EXERCISE from 07/14/2021 in [place]  Date 06/30/21  Educator [place]  Instruction Review Code 1- Verbalizes Understanding       Warning Signs of Stroke / TIA -Discuss definition of a stroke, what the signs and symptoms are of a stroke, and how to identify when someone is having stroke.   Knowledge Questionnaire Score:  Knowledge Questionnaire Score - 05/14/21 1335  Knowledge Questionnaire Score   Pre Score 23/24             Core Components/Risk Factors/Patient Goals at Admission:  Personal Goals and Risk Factors at Admission - 05/14/21 1336       Core Components/Risk Factors/Patient Goals on Admission    Weight Management Yes    Intervention Weight Management: Develop a combined  nutrition and exercise program designed to reach desired caloric intake, while maintaining appropriate intake of nutrient and fiber, sodium and fats, and appropriate energy expenditure required for the weight goal.;Weight Management/Obesity: Establish reasonable short term and long term weight goals.    Admit Weight 307 lb (139.3 kg)    Goal Weight: Short Term 302 lb (137 kg)    Goal Weight: Long Term 297 lb (134.7 kg)    Expected Outcomes Weight Maintenance: Understanding of the daily nutrition guidelines, which includes 25-35% calories from fat, 7% or less cal from saturated fats, less than '200mg'$  cholesterol, less than 1.5gm of sodium, & 5 or more servings of fruits and vegetables daily;Weight Loss: Understanding of general recommendations for a balanced deficit meal plan, which promotes 1-2 lb weight loss per week and includes a negative energy balance of [phone] kcal/d;Weight Gain: Understanding of general recommendations for a high calorie, high protein meal plan that promotes weight gain by distributing calorie intake throughout the day with the consumption for 4-5 meals, snacks, and/or supplements    Diabetes Yes    Intervention Provide education about signs/symptoms and action to take for hypo/hyperglycemia.;Provide education about proper nutrition, including hydration, and aerobic/resistive exercise prescription along with prescribed medications to achieve blood glucose in normal ranges: Fasting glucose 65-99 mg/dL    Expected Outcomes Short Term: Participant verbalizes understanding of the signs/symptoms and immediate care of hyper/hypoglycemia, proper foot care and importance of medication, aerobic/resistive exercise and nutrition plan for blood glucose control.    Personal Goal Other Yes    Personal Goal Patient would like to lose weight; increase his strenght and energy and learn how hard he can push himself and what he is able to do.    Intervention Patient will participate in the CR program  with exercise and education 3 days/week and supplement with exercise at home.    Expected Outcomes Patient will complete the program meeting both personal and program goals.             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 He has multiple risk factors for CAD and is participating in the program for risk modification. He started today and did well. His initial weight was 307. His last A1C on file was 6.3% 02/26/21 down from 8.2% 4 years ago. His DM is managed with insulin and Jardiance. His personal goals for the program are to gain strength and energy; lose weight and learn what he can do and how far he can push himself. We will continue to monitor his progress as he works towards meeting these goals. Patient has completed 10 sessions. His current weight is 307.2 lbs maintained since last 30 day review. He is doing well in the program with consistent attendance and progressions.  His last A1C was 6.3% on 2/24 trending down. He is on Insulin for DM controll. He saw Dr. Aundra Dubin 5/30 for routine follow up. No changes made. Patient's blood pressue are at goals. He had hypotension last week with resting b/p 90/50 and he said he did not feel good. We did not allow him to exercise. He stopped taking Entresto on his own and called cardiologist and told them he discontinued Entresto and he felt better overall. They were okay with him staying off of the medication. His personal goals for the program continue to be to gain strenght and energy; lose weight; learn what he can do and  how far he can push himself. We will continue to monitor his progress as he works towards meeting these goals. Patient has completed 22 sessions. His current weight is 309.2 lbs gaining 2 lbs since last 30 day review. He continues to do well in the program with consistent attendance and progressions. His last A1C was 6.3% on 2/24 trending down. He is on Insulin for DM control. His reported glucose readings average 110 mg/dl. Patient's blood pressue continues to be at goal. He says the program is helping feel stronger with improved energy.  His personal goals for the program continue to be to gain strenght and energy; lose weight; learn what he can do and how far he can push himself. We will continue to monitor his progress as he works towards meeting these goals.     Expected Outcomes Patient will complete the program meeting both personal and program goals. Patient will complete the program meeting both personal and program goals. Patient will complete the program meeting both personal and program goals.              Core Components/Risk Factors/Patient Goals at Discharge (Final Review):   Goals and Risk Factor Review - 07/12/21 1344       Core Components/Risk Factors/Patient Goals Review   Personal Goals Review Weight Management/Obesity;Diabetes;Other    Review Patient has completed 22 sessions. His current weight is 309.2 lbs gaining 2 lbs since last 30 day review. He continues to do well in the program with consistent attendance and progressions. His last A1C was 6.3% on 2/24 trending down. He is on Insulin for DM control. His reported glucose readings average 110 mg/dl. Patient's blood pressue continues to be at goal. He says the program is helping feel stronger with improved energy.  His personal goals for the program continue to be to gain strenght and energy; lose weight; learn what he can do and how far he can push himself. We will continue to monitor his progress as he works towards meeting  these goals.    Expected Outcomes Patient will complete the program meeting both personal and program goals.             ITP Comments:   Comments: ITP REVIEW Pt is making expected progress toward Cardiac Rehab goals after completing 28 sessions. Recommend continued exercise, life style modification, education, and increased stamina and strength.

## 2021-07-23 ENCOUNTER — Encounter (HOSPITAL_COMMUNITY)
Admission: RE | Admit: 2021-07-23 | Discharge: 2021-07-23 | Disposition: A | Discharge status: Home / Self Care | Payer: No Typology Code available for payment source | Source: Ambulatory Visit | Attending: Family Medicine | Admitting: Family Medicine

## 2021-07-23 DIAGNOSIS — I213 ST elevation (STEMI) myocardial infarction of unspecified site: Secondary | ICD-10-CM

## 2021-07-23 DIAGNOSIS — I2510 Atherosclerotic heart disease of native coronary artery without angina pectoris: Secondary | ICD-10-CM

## 2021-07-23 NOTE — Progress Notes (Signed)
Daily Session Note  Patient Details  Name: NORTH ESTERLINE MRN: [id number] Date of Birth: [birth year] Referring Provider:   Flowsheet Row CARDIAC REHAB PHASE II ORIENTATION from 05/14/2021 in [place]  Referring Provider Dr. Alferd Patee       Encounter Date: 07/23/2021  Check In:  Session Check In - 07/23/21 1100       Check-In   Supervising physician immediately available to respond to emergencies [facility] MD immediately available    Physician(s) Dr. Debara Pickett    Location [facility]    Staff Present Hoy Register, MS, ACSM-CEP, Exercise Physiologist;Debra Wynetta Emery, RN, BSN;Amyr Sluder Otho Ket, BS, Exercise Physiologist    Virtual Visit No    Medication changes reported     No    Fall or balance concerns reported    Yes    Comments Patient has a history of falls and impaired balance.    Tobacco Cessation No Change    Warm-up and Cool-down Performed as group-led instruction    Resistance Training Performed Yes    VAD Patient? No    PAD/SET Patient? No      Pain Assessment   Currently in Pain? No/denies    Pain Score 0-No pain    Multiple Pain Sites No             Capillary Blood Glucose: No results found for this or any previous visit (from the past 24 hour(s)).    Social History   Tobacco Use  Smoking Status Former   Packs/day: 1.50   Years: 42.00   Total pack years: 63.00   Types: Cigarettes   Quit date: 09/03/2013   Years since quitting: 7.8  Smokeless Tobacco Former   Types: Chew  Tobacco Comments   We discussed the need to remain smoke free and to call me for help if he has the desire to smoke again.    Goals Met:  Independence with exercise equipment Exercise tolerated well No report of concerns or symptoms today Strength training completed today  Goals Unmet:  Not Applicable  Comments: check out 1200   Dr. Carlyle Dolly is Medical Director for [place]

## 2021-07-26 ENCOUNTER — Encounter (HOSPITAL_COMMUNITY)
Admission: RE | Admit: 2021-07-26 | Discharge: 2021-07-26 | Disposition: A | Discharge status: Home / Self Care | Payer: No Typology Code available for payment source | Source: Ambulatory Visit | Attending: Family Medicine | Admitting: Family Medicine

## 2021-07-26 DIAGNOSIS — I213 ST elevation (STEMI) myocardial infarction of unspecified site: Secondary | ICD-10-CM | POA: Diagnosis not present

## 2021-07-26 DIAGNOSIS — I2510 Atherosclerotic heart disease of native coronary artery without angina pectoris: Secondary | ICD-10-CM

## 2021-07-26 NOTE — Progress Notes (Signed)
Daily Session Note  Patient Details  Name: RANDI COLLEGE MRN: [id number] Date of Birth: 12-May-1955 Referring Provider:   Flowsheet Row CARDIAC REHAB PHASE II ORIENTATION from 05/14/2021 in [place]  Referring Provider Dr. Alferd Patee       Encounter Date: 07/26/2021  Check In:  Session Check In - 07/26/21 1057       Check-In   Supervising physician immediately available to respond to emergencies [facility] MD immediately available    Physician(s) Dr Domenic Polite    Location [facility]    Staff Present Hoy Register, MS, ACSM-CEP, Exercise Physiologist;Debra Wynetta Emery, RN, Joanette Gula, RN, BSN    Virtual Visit No    Medication changes reported     No    Fall or balance concerns reported    Yes    Comments Patient has a history of falls and impaired balance.    Tobacco Cessation No Change    Warm-up and Cool-down Performed as group-led instruction    Resistance Training Performed Yes    VAD Patient? No    PAD/SET Patient? No      Pain Assessment   Currently in Pain? No/denies    Pain Score 0-No pain    Multiple Pain Sites No             Capillary Blood Glucose: No results found for this or any previous visit (from the past 24 hour(s)).    Social History   Tobacco Use  Smoking Status Former   Packs/day: 1.50   Years: 42.00   Total pack years: 63.00   Types: Cigarettes   Quit date: 09/03/2013   Years since quitting: 7.8  Smokeless Tobacco Former   Types: Chew  Tobacco Comments   We discussed the need to remain smoke free and to call me for help if he has the desire to smoke again.    Goals Met:  Independence with exercise equipment Exercise tolerated well No report of concerns or symptoms today Strength training completed today  Goals Unmet:  Not Applicable  Comments: Checkout at 1200.   Dr. Carlyle Dolly is Medical Director for [facility]

## 2021-07-28 ENCOUNTER — Encounter (HOSPITAL_COMMUNITY)
Admission: RE | Admit: 2021-07-28 | Discharge: 2021-07-28 | Disposition: A | Discharge status: Home / Self Care | Payer: No Typology Code available for payment source | Source: Ambulatory Visit | Attending: Family Medicine | Admitting: Family Medicine

## 2021-07-28 DIAGNOSIS — I213 ST elevation (STEMI) myocardial infarction of unspecified site: Secondary | ICD-10-CM

## 2021-07-28 DIAGNOSIS — I2510 Atherosclerotic heart disease of native coronary artery without angina pectoris: Secondary | ICD-10-CM

## 2021-07-28 NOTE — Progress Notes (Signed)
Daily Session Note  Patient Details  Name: [name] MRN: [id number] Date of Birth: 1955/02/11 Referring Provider:   Flowsheet Row CARDIAC REHAB PHASE II ORIENTATION from 05/14/2021 in [place]  Referring Provider Dr. Alferd Patee       Encounter Date: 07/28/2021  Check In:  Session Check In - 07/28/21 1100       Check-In   Supervising physician immediately available to respond to emergencies [facility] MD immediately available    Physician(s) Dr. Johnsie Cancel    Location [facility]    Staff Present Geanie Cooley, RN;Daphyne Hassell Done, RN, Bjorn Loser, MS, ACSM-CEP, Exercise Physiologist    Virtual Visit No    Medication changes reported     No    Fall or balance concerns reported    Yes    Comments Patient has a history of falls and impaired balance.    Tobacco Cessation No Change    Warm-up and Cool-down Performed as group-led instruction    Resistance Training Performed Yes    VAD Patient? No    PAD/SET Patient? No      Pain Assessment   Currently in Pain? No/denies    Pain Score 0-No pain    Multiple Pain Sites No             Capillary Blood Glucose: No results found for this or any previous visit (from the past 24 hour(s)).    Social History   Tobacco Use  Smoking Status Former   Packs/day: 1.50   Years: 42.00   Total pack years: 63.00   Types: Cigarettes   Quit date: 09/03/2013   Years since quitting: 7.9  Smokeless Tobacco Former   Types: Chew  Tobacco Comments   We discussed the need to remain smoke free and to call me for help if he has the desire to smoke again.    Goals Met:  Independence with exercise equipment Exercise tolerated well No report of concerns or symptoms today Strength training completed today  Goals Unmet:  Not Applicable  Comments: check out @ 12:00pm   Dr. Carlyle Dolly is Medical Director for [place]

## 2021-07-30 ENCOUNTER — Encounter (HOSPITAL_COMMUNITY)
Admission: RE | Admit: 2021-07-30 | Discharge: 2021-07-30 | Disposition: A | Discharge status: Home / Self Care | Payer: No Typology Code available for payment source | Source: Ambulatory Visit | Attending: Family Medicine | Admitting: Family Medicine

## 2021-07-30 DIAGNOSIS — I2510 Atherosclerotic heart disease of native coronary artery without angina pectoris: Secondary | ICD-10-CM

## 2021-07-30 DIAGNOSIS — I213 ST elevation (STEMI) myocardial infarction of unspecified site: Secondary | ICD-10-CM | POA: Diagnosis not present

## 2021-07-30 NOTE — Progress Notes (Signed)
Daily Session Note  Patient Details  Name: [name] MRN: [id number] Date of Birth: Jul 07, 1955 Referring Provider:   Flowsheet Row CARDIAC REHAB PHASE II ORIENTATION from 05/14/2021 in [place]  Referring Provider Dr. Alferd Patee       Encounter Date: 07/30/2021  Check In:  Session Check In - 07/30/21 1045       Check-In   Supervising physician immediately available to respond to emergencies [facility] MD immediately available    Physician(s) Dr Julieanne Manson    Location [facility]    Staff Present Geanie Cooley, RN;Devondre Guzzetta Hassell Done, RN, Bjorn Loser, MS, ACSM-CEP, Exercise Physiologist    Virtual Visit No    Medication changes reported     No    Comments Patient has a history of falls and impaired balance.    Tobacco Cessation No Change    Warm-up and Cool-down Performed as group-led instruction    Resistance Training Performed Yes    VAD Patient? No    PAD/SET Patient? No      Pain Assessment   Currently in Pain? No/denies    Pain Score 0-No pain    Multiple Pain Sites No             Capillary Blood Glucose: No results found for this or any previous visit (from the past 24 hour(s)).    Social History   Tobacco Use  Smoking Status Former   Packs/day: 1.50   Years: 42.00   Total pack years: 63.00   Types: Cigarettes   Quit date: 09/03/2013   Years since quitting: 7.9  Smokeless Tobacco Former   Types: Chew  Tobacco Comments   We discussed the need to remain smoke free and to call me for help if he has the desire to smoke again.    Goals Met:  Independence with exercise equipment Exercise tolerated well No report of concerns or symptoms today Strength training completed today  Goals Unmet:  Not Applicable  Comments: Checkout at 1200.   Dr. Carlyle Dolly is Medical Director for [facility]

## 2021-08-02 ENCOUNTER — Encounter (HOSPITAL_COMMUNITY)
Admission: RE | Admit: 2021-08-02 | Discharge: 2021-08-02 | Disposition: A | Discharge status: Home / Self Care | Payer: No Typology Code available for payment source | Source: Ambulatory Visit | Attending: Family Medicine | Admitting: Family Medicine

## 2021-08-02 VITALS — Wt 311.7 lb

## 2021-08-02 DIAGNOSIS — I213 ST elevation (STEMI) myocardial infarction of unspecified site: Secondary | ICD-10-CM

## 2021-08-02 DIAGNOSIS — I2510 Atherosclerotic heart disease of native coronary artery without angina pectoris: Secondary | ICD-10-CM

## 2021-08-02 NOTE — Progress Notes (Signed)
Daily Session Note  Patient Details  Name: [name] MRN: [id number] Date of Birth: 03-Sep-1955 Referring Provider:   Flowsheet Row CARDIAC REHAB PHASE II ORIENTATION from 05/14/2021 in [place]  Referring Provider Dr. Alferd Patee       Encounter Date: 08/02/2021  Check In:  Session Check In - 08/02/21 1101       Check-In   Supervising physician immediately available to respond to emergencies [facility] MD immediately available    Physician(s) Dr. Harrington Challenger    Location [facility]    Staff Present Geanie Cooley, RN;Heather Otho Ket, BS, Exercise Physiologist;Shakara Tweedy Wynetta Emery, RN, BSN    Virtual Visit No    Medication changes reported     No    Fall or balance concerns reported    Yes    Comments Patient has a history of falls and impaired balance.    Tobacco Cessation No Change    Warm-up and Cool-down Performed as group-led instruction    Resistance Training Performed Yes    VAD Patient? No    PAD/SET Patient? No      Pain Assessment   Currently in Pain? No/denies    Pain Score 0-No pain    Multiple Pain Sites No             Capillary Blood Glucose: No results found for this or any previous visit (from the past 24 hour(s)).    Social History   Tobacco Use  Smoking Status Former   Packs/day: 1.50   Years: 42.00   Total pack years: 63.00   Types: Cigarettes   Quit date: 09/03/2013   Years since quitting: 7.9  Smokeless Tobacco Former   Types: Chew  Tobacco Comments   We discussed the need to remain smoke free and to call me for help if he has the desire to smoke again.    Goals Met:  Independence with exercise equipment Exercise tolerated well No report of concerns or symptoms today Strength training completed today  Goals Unmet:  Not Applicable  Comments: Check out 1200.   Dr. Carlyle Dolly is Medical Director for [facility]

## 2021-08-04 ENCOUNTER — Encounter (HOSPITAL_COMMUNITY): Payer: No Typology Code available for payment source

## 2021-08-06 ENCOUNTER — Encounter (HOSPITAL_COMMUNITY): Payer: No Typology Code available for payment source

## 2021-08-09 ENCOUNTER — Encounter (HOSPITAL_COMMUNITY): Payer: No Typology Code available for payment source

## 2021-08-09 ENCOUNTER — Encounter (HOSPITAL_COMMUNITY): Payer: Self-pay

## 2021-08-09 ENCOUNTER — Other Ambulatory Visit: Payer: Self-pay

## 2021-08-09 ENCOUNTER — Emergency Department (HOSPITAL_COMMUNITY)
Admission: EM | Admit: 2021-08-09 | Discharge: 2021-08-09 | Disposition: A | Discharge status: Home / Self Care | Payer: No Typology Code available for payment source | Attending: Emergency Medicine | Admitting: Emergency Medicine

## 2021-08-09 DIAGNOSIS — I509 Heart failure, unspecified: Secondary | ICD-10-CM | POA: Diagnosis not present

## 2021-08-09 DIAGNOSIS — M4807 Spinal stenosis, lumbosacral region: Secondary | ICD-10-CM | POA: Diagnosis not present

## 2021-08-09 DIAGNOSIS — Z96643 Presence of artificial hip joint, bilateral: Secondary | ICD-10-CM | POA: Diagnosis not present

## 2021-08-09 DIAGNOSIS — Z7902 Long term (current) use of antithrombotics/antiplatelets: Secondary | ICD-10-CM | POA: Diagnosis not present

## 2021-08-09 DIAGNOSIS — Z794 Long term (current) use of insulin: Secondary | ICD-10-CM | POA: Diagnosis not present

## 2021-08-09 DIAGNOSIS — Z96651 Presence of right artificial knee joint: Secondary | ICD-10-CM | POA: Insufficient documentation

## 2021-08-09 DIAGNOSIS — M79604 Pain in right leg: Secondary | ICD-10-CM | POA: Diagnosis present

## 2021-08-09 DIAGNOSIS — M5441 Lumbago with sciatica, right side: Secondary | ICD-10-CM | POA: Insufficient documentation

## 2021-08-09 MED ORDER — OXYCODONE-ACETAMINOPHEN 5-325 MG PO TABS: 1.0000 | ORAL_TABLET | Freq: Four times a day (QID) | ORAL | 0 refills | Status: AC | PRN

## 2021-08-09 MED ORDER — DEXAMETHASONE SODIUM PHOSPHATE 10 MG/ML IJ SOLN
10.0000 mg | Freq: Once | INTRAMUSCULAR | Status: AC
Start: 2021-08-09 — End: 2021-08-09
  Administered 2021-08-09: 10 mg via INTRAMUSCULAR
  Filled 2021-08-09: qty 1

## 2021-08-09 MED ORDER — OXYCODONE-ACETAMINOPHEN 5-325 MG PO TABS
1.0000 | ORAL_TABLET | Freq: Once | ORAL | Status: AC
  Administered 2021-08-09: 1 via ORAL
  Filled 2021-08-09: qty 1

## 2021-08-09 NOTE — ED Triage Notes (Signed)
Right leg pain that has been ongoing. Hx of back surgery. Reports numbness but it is nothing new.

## 2021-08-09 NOTE — Progress Notes (Signed)
Discharge Progress Report  Patient Details  Name: [name] MRN: [id number] Date of Birth: 1955-02-10 Referring Provider:   Flowsheet Row CARDIAC REHAB PHASE II ORIENTATION from 05/14/2021 in [place]  Referring Provider Dr. Alferd Patee        Number of Visits: 33  Reason for Discharge:  Early Exit:  Back injury  Smoking History:  Social History   Tobacco Use  Smoking Status Former   Packs/day: 1.50   Years: 42.00   Total pack years: 63.00   Types: Cigarettes   Quit date: 09/03/2013   Years since quitting: 7.9  Smokeless Tobacco Former   Types: Chew  Tobacco Comments   We discussed the need to remain smoke free and to call me for help if he has the desire to smoke again.    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 Alferd Patee    Expected Discharge Date 08/04/21      NuStep   Level 1    SPM 60    Minutes 22      Arm Ergometer   Level 1    RPM 50    Minutes 17      Prescription Details   Frequency (times per week) 3    Duration Progress to 30 minutes of continuous aerobic without signs/symptoms of physical distress      Intensity   THRR 40-80% of Max Heartrate 61-122    Ratings of Perceived Exertion 11-13      Resistance Training   Training Prescription Yes    Weight 3    Reps 10-15             Discharge Exercise Prescription (Final Exercise Prescription Changes):  Exercise Prescription Changes - 08/02/21 1200       Response to Exercise   Blood Pressure (Admit) 120/58    Blood Pressure (Exercise) 170/60    Blood Pressure (Exit) 108/58    Heart Rate (Admit) 78 bpm    Heart Rate (Exercise) 121 bpm    Heart  Rate (Exit) 88 bpm    Rating of Perceived Exertion (Exercise) 12    Duration Continue with 30 min of aerobic exercise without signs/symptoms of physical distress.    Intensity THRR unchanged      Progression   Progression Continue to progress workloads to maintain intensity without signs/symptoms of physical distress.      Resistance Training   Training Prescription Yes    Weight 5    Reps 10-15    Time 1 Minutes      NuStep   Level 4    SPM 121    Minutes 22    METs 3.25      Arm Ergometer   Level 4    RPM 77    Minutes 17    METs 3.71             Functional Capacity:  6 Minute Walk     Row Name 05/14/21 1402         6 Minute Walk   Phase Initial     Distance 1200 feet     Walk Time 6 minutes     # of Rest  Breaks 0     MPH 2.27     METS 2.18     RPE 12     VO2 Peak 7.64     Symptoms Yes (comment)     Comments L hip pain due to a recent fall and R knee pain     Resting HR 68 bpm     Resting BP 112/60     Resting Oxygen Saturation  94 %     Exercise Oxygen Saturation  during 6 min walk 98 %     Max Ex. HR 109 bpm     Max Ex. BP 140/70     2 Minute Post BP 130/66              Psychological, QOL, Others - Outcomes: PHQ 2/9:    05/14/2021    1:30 PM 09/29/2014    2:13 PM 08/30/2012    9:35 AM  Depression screen PHQ 2/9  Decreased Interest 0 0 0  Down, Depressed, Hopeless 1 0 0  PHQ - 2 Score 1 0 0  Altered sleeping 1    Tired, decreased energy 1    Change in appetite 1    Feeling bad or failure about yourself  1    Trouble concentrating 0    Moving slowly or fidgety/restless 0    Suicidal thoughts 0    PHQ-9 Score 5    Difficult doing work/chores Not difficult at all      Quality of Life:  Quality of Life - 05/14/21 1408       Quality of Life   Select Quality of Life      Quality of Life Scores   Health/Function Pre 15.23 %    Socioeconomic Pre 18.75 %    Psych/Spiritual Pre 16.21 %    Family Pre 25.2 %    GLOBAL Pre 17.59 %              Personal Goals: Goals established at orientation with interventions provided to work toward goal.  Personal Goals and Risk Factors at Admission - 05/14/21 1336       Core Components/Risk Factors/Patient Goals on Admission    Weight Management Yes    Intervention Weight Management: Develop a combined nutrition and exercise program designed to reach desired caloric intake, while maintaining appropriate intake of nutrient and fiber, sodium and fats, and appropriate energy expenditure required for the weight goal.;Weight Management/Obesity: Establish reasonable short term and long term weight goals.    Admit Weight 307 lb (139.3 kg)    Goal Weight: Short Term 302 lb (137 kg)    Goal Weight: Long Term 297 lb (134.7 kg)    Expected Outcomes Weight Maintenance: Understanding of the daily nutrition guidelines, which includes 25-35% calories from fat, 7% or less cal from saturated fats, less than '200mg'$  cholesterol, less than 1.5gm of sodium, & 5 or more servings of fruits and vegetables daily;Weight Loss: Understanding of general recommendations for a balanced deficit meal plan, which promotes 1-2 lb weight loss per week and includes a negative energy balance of [phone] kcal/d;Weight Gain: Understanding of general recommendations for a high calorie, high protein meal plan that promotes weight gain by distributing calorie intake throughout the day with the consumption for 4-5 meals, snacks, and/or supplements    Diabetes Yes    Intervention Provide education about signs/symptoms and action to take for hypo/hyperglycemia.;Provide education about proper nutrition, including hydration, and aerobic/resistive exercise prescription along with prescribed medications to achieve blood glucose in  normal ranges: Fasting glucose 65-99 mg/dL    Expected Outcomes Short Term: Participant verbalizes understanding of the signs/symptoms and immediate care of hyper/hypoglycemia, proper foot care and importance  of medication, aerobic/resistive exercise and nutrition plan for blood glucose control.    Personal Goal Other Yes    Personal Goal Patient would like to lose weight; increase his strenght and energy and learn how hard he can push himself and what he is able to do.    Intervention Patient will participate in the [facility] program with exercise and education 3 days/week and supplement with exercise at home.    Expected Outcomes Patient will complete the program meeting both personal and program goals.              Personal Goals Discharge:  Goals and Risk Factor Review     Row Name 05/17/21 1240 06/14/21 1250 07/12/21 1344         Core Components/Risk Factors/Patient Goals Review   Personal Goals Review Weight Management/Obesity;Diabetes;Other Weight Management/Obesity;Diabetes;Other Weight Management/Obesity;Diabetes;Other     Review Patient was referred to CR with Atherosclerotic heart disease of native coronary artery without angina pectoris and STEMI. He has multiple risk factors for CAD and is participating in the program for risk modification. He started today and did well. His initial weight was 307. His last A1C on file was 6.3% 02/26/21 down from 8.2% 4 years ago. His DM is managed with insulin and Jardiance. His personal goals for the program are to gain strength and energy; lose weight and learn what he can do and how far he can push himself. We will continue to monitor his progress as he works towards meeting these goals. Patient has completed 10 sessions. His current weight is 307.2 lbs maintained since last 30 day review. He is doing well in the program with consistent attendance and progressions. His last A1C was 6.3% on 2/24 trending down. He is on Insulin for DM controll. He saw Dr. Aundra Dubin 5/30 for routine follow up. No changes made. Patient's blood pressue are at goals. He had hypotension last week with resting b/p 90/50 and he said he did not feel good. We did not allow him to  exercise. He stopped taking Entresto on his own and called cardiologist and told them he discontinued Entresto and he felt better overall. They were okay with him staying off of the medication. His personal goals for the program continue to be to gain strenght and energy; lose weight; learn what he can do and how far he can push himself. We will continue to monitor his progress as he works towards meeting these goals. Patient has completed 22 sessions. His current weight is 309.2 lbs gaining 2 lbs since last 30 day review. He continues to do well in the program with consistent attendance and progressions. His last A1C was 6.3% on 2/24 trending down. He is on Insulin for DM control. His reported glucose readings average 110 mg/dl. Patient's blood pressue continues to be at goal. He says the program is helping feel stronger with improved energy.  His personal goals for the program continue to be to gain strenght and energy; lose weight; learn what he can do and how far he can push himself. We will continue to monitor his progress as he works towards meeting these goals.     Expected Outcomes Patient will complete the program meeting both personal and program goals. Patient will complete the program meeting both personal and program goals. Patient will complete the program  meeting both personal and program goals.              Exercise Goals and Review:  Exercise Goals     Row Name 05/14/21 1406 05/24/21 1428 06/21/21 1347 07/19/21 1408       Exercise Goals   Increase Physical Activity Yes Yes Yes Yes    Intervention Provide advice, education, support and counseling about physical activity/exercise needs.;Develop an individualized exercise prescription for aerobic and resistive training based on initial evaluation findings, risk stratification, comorbidities and participant's personal goals. Provide advice, education, support and counseling about physical activity/exercise needs.;Develop an  individualized exercise prescription for aerobic and resistive training based on initial evaluation findings, risk stratification, comorbidities and participant's personal goals. Provide advice, education, support and counseling about physical activity/exercise needs.;Develop an individualized exercise prescription for aerobic and resistive training based on initial evaluation findings, risk stratification, comorbidities and participant's personal goals. Provide advice, education, support and counseling about physical activity/exercise needs.;Develop an individualized exercise prescription for aerobic and resistive training based on initial evaluation findings, risk stratification, comorbidities and participant's personal goals.    Expected Outcomes Short Term: Attend rehab on a regular basis to increase amount of physical activity.;Long Term: Add in home exercise to make exercise part of routine and to increase amount of physical activity.;Long Term: Exercising regularly at least 3-5 days a week. Short Term: Attend rehab on a regular basis to increase amount of physical activity.;Long Term: Add in home exercise to make exercise part of routine and to increase amount of physical activity.;Long Term: Exercising regularly at least 3-5 days a week. Short Term: Attend rehab on a regular basis to increase amount of physical activity.;Long Term: Add in home exercise to make exercise part of routine and to increase amount of physical activity.;Long Term: Exercising regularly at least 3-5 days a week. Short Term: Attend rehab on a regular basis to increase amount of physical activity.;Long Term: Add in home exercise to make exercise part of routine and to increase amount of physical activity.;Long Term: Exercising regularly at least 3-5 days a week.    Increase Strength and Stamina Yes Yes Yes Yes    Intervention Provide advice, education, support and counseling about physical activity/exercise needs.;Develop an  individualized exercise prescription for aerobic and resistive training based on initial evaluation findings, risk stratification, comorbidities and participant's personal goals. Provide advice, education, support and counseling about physical activity/exercise needs.;Develop an individualized exercise prescription for aerobic and resistive training based on initial evaluation findings, risk stratification, comorbidities and participant's personal goals. Provide advice, education, support and counseling about physical activity/exercise needs.;Develop an individualized exercise prescription for aerobic and resistive training based on initial evaluation findings, risk stratification, comorbidities and participant's personal goals. Provide advice, education, support and counseling about physical activity/exercise needs.;Develop an individualized exercise prescription for aerobic and resistive training based on initial evaluation findings, risk stratification, comorbidities and participant's personal goals.    Expected Outcomes Short Term: Increase workloads from initial exercise prescription for resistance, speed, and METs.;Short Term: Perform resistance training exercises routinely during rehab and add in resistance training at home;Long Term: Improve cardiorespiratory fitness, muscular endurance and strength as measured by increased METs and functional capacity (6MWT) Short Term: Increase workloads from initial exercise prescription for resistance, speed, and METs.;Short Term: Perform resistance training exercises routinely during rehab and add in resistance training at home;Long Term: Improve cardiorespiratory fitness, muscular endurance and strength as measured by increased METs and functional capacity (6MWT) Short Term: Increase workloads from initial exercise prescription for resistance,  speed, and METs.;Short Term: Perform resistance training exercises routinely during rehab and add in resistance training at  home;Long Term: Improve cardiorespiratory fitness, muscular endurance and strength as measured by increased METs and functional capacity (6MWT) Short Term: Increase workloads from initial exercise prescription for resistance, speed, and METs.;Short Term: Perform resistance training exercises routinely during rehab and add in resistance training at home;Long Term: Improve cardiorespiratory fitness, muscular endurance and strength as measured by increased METs and functional capacity (6MWT)    Able to understand and use rate of perceived exertion (RPE) scale Yes Yes Yes Yes    Intervention Provide education and explanation on how to use RPE scale Provide education and explanation on how to use RPE scale Provide education and explanation on how to use RPE scale Provide education and explanation on how to use RPE scale    Expected Outcomes Short Term: Able to use RPE daily in rehab to express subjective intensity level;Long Term:  Able to use RPE to guide intensity level when exercising independently Short Term: Able to use RPE daily in rehab to express subjective intensity level;Long Term:  Able to use RPE to guide intensity level when exercising independently Short Term: Able to use RPE daily in rehab to express subjective intensity level;Long Term:  Able to use RPE to guide intensity level when exercising independently Short Term: Able to use RPE daily in rehab to express subjective intensity level;Long Term:  Able to use RPE to guide intensity level when exercising independently    Knowledge and understanding of Target Heart Rate Range (THRR) Yes Yes Yes Yes    Intervention Provide education and explanation of THRR including how the numbers were predicted and where they are located for reference Provide education and explanation of THRR including how the numbers were predicted and where they are located for reference Provide education and explanation of THRR including how the numbers were predicted and where  they are located for reference Provide education and explanation of THRR including how the numbers were predicted and where they are located for reference    Expected Outcomes Short Term: Able to state/look up THRR;Long Term: Able to use THRR to govern intensity when exercising independently;Short Term: Able to use daily as guideline for intensity in rehab Short Term: Able to state/look up THRR;Long Term: Able to use THRR to govern intensity when exercising independently;Short Term: Able to use daily as guideline for intensity in rehab Short Term: Able to state/look up THRR;Long Term: Able to use THRR to govern intensity when exercising independently;Short Term: Able to use daily as guideline for intensity in rehab Short Term: Able to state/look up THRR;Long Term: Able to use THRR to govern intensity when exercising independently;Short Term: Able to use daily as guideline for intensity in rehab    Able to check pulse independently Yes Yes Yes Yes    Intervention Provide education and demonstration on how to check pulse in carotid and radial arteries.;Review the importance of being able to check your own pulse for safety during independent exercise Provide education and demonstration on how to check pulse in carotid and radial arteries.;Review the importance of being able to check your own pulse for safety during independent exercise Provide education and demonstration on how to check pulse in carotid and radial arteries.;Review the importance of being able to check your own pulse for safety during independent exercise Provide education and demonstration on how to check pulse in carotid and radial arteries.;Review the importance of being able to check your own  pulse for safety during independent exercise    Expected Outcomes Short Term: Able to explain why pulse checking is important during independent exercise;Long Term: Able to check pulse independently and accurately Short Term: Able to explain why pulse  checking is important during independent exercise;Long Term: Able to check pulse independently and accurately Short Term: Able to explain why pulse checking is important during independent exercise;Long Term: Able to check pulse independently and accurately Short Term: Able to explain why pulse checking is important during independent exercise;Long Term: Able to check pulse independently and accurately    Understanding of Exercise Prescription Yes Yes Yes Yes    Intervention Provide education, explanation, and written materials on patient's individual exercise prescription Provide education, explanation, and written materials on patient's individual exercise prescription Provide education, explanation, and written materials on patient's individual exercise prescription Provide education, explanation, and written materials on patient's individual exercise prescription    Expected Outcomes Short Term: Able to explain program exercise prescription;Long Term: Able to explain home exercise prescription to exercise independently Short Term: Able to explain program exercise prescription;Long Term: Able to explain home exercise prescription to exercise independently Short Term: Able to explain program exercise prescription;Long Term: Able to explain home exercise prescription to exercise independently Short Term: Able to explain program exercise prescription;Long Term: Able to explain home exercise prescription to exercise independently             Exercise Goals Re-Evaluation:  Exercise Goals Re-Evaluation     Row Name 05/24/21 1429 06/21/21 1347 07/19/21 1409         Exercise Goal Re-Evaluation   Exercise Goals Review Increase Physical Activity;Increase Strength and Stamina;Able to understand and use rate of perceived exertion (RPE) scale;Knowledge and understanding of Target Heart Rate Range (THRR);Able to check pulse independently;Understanding of Exercise Prescription Increase Strength and  Stamina;Able to understand and use rate of perceived exertion (RPE) scale;Able to check pulse independently;Understanding of Exercise Prescription;Knowledge and understanding of Target Heart Rate Range (THRR);Increase Physical Activity Increase Physical Activity;Increase Strength and Stamina;Able to understand and use rate of perceived exertion (RPE) scale;Able to check pulse independently;Knowledge and understanding of Target Heart Rate Range (THRR);Understanding of Exercise Prescription     Comments Pt has completed 5 sessions of cardiac rehab. He is motivated when in class ans eager to progress his workload. He pushed himself the first week of class and went over his THR and was asked to slow down. He stated that level 1 feels to easy but his HR increased when he tried to increase the level. He is currently exercising at 2.13 METs on the stepper. Will continue to monitor and progress as able. Pt has completed 14 sessions of cardiac rehab. He is motivated in class and eager to progress his workload. He states that he feels like he is not working hard enough even through his workload is level 4. He complains about his L knee hurting the day after exercise but does not take advice to use arm ergometer without the foot pedals. He is currently exercising at 2.85 METs on the arm ergometer. Will continue to monitor and progress as able. Pt has completed 26 sessions of cardiac rehab. He is eagar to progress and increase his workload during class. He states that he does not feel like he is working hard enough unless he is at a high level and pushing himself. He is on level 4 on both sets and his HR is within the target. He is currently exercising at 3.31 METs on  the AE. Will continue to monitor and progress as able.     Expected Outcomes Through exercise at rehab and at home, the patient will meet their stated goals. Through exercise at rehab and at home, the patient will meet their stated goals. Through exercise at  rehab and at home, the patient will meet their stated goals.              Nutrition & Weight - Outcomes:  Pre Biometrics - 05/14/21 1407       Pre Biometrics   Height 6' (1.829 m)    Weight 307 lb 1.6 oz (139.3 kg)    Waist Circumference 54 inches    Hip Circumference 51 inches    Waist to Hip Ratio 1.06 %    BMI (Calculated) 41.64    Triceps Skinfold 25 mm    % Body Fat 40.9 %    Grip Strength 19.4 kg    Flexibility 0 in    Single Leg Stand 8 seconds              Nutrition:  Nutrition Therapy & Goals - 05/14/21 1332       Personal Nutrition Goals   Comments Patient scored 69 on his diet assessment. Patient says he follows a low fat, low carb diet. Handout provided and explained regarding healthier choices and DM information. Patient verbalized understanding. We offer 2 educational sessions on heart healthy nutrition with handouts and assistance with RD referral if patient is interested.      Intervention Plan   Intervention Nutrition handout(s) given to patient.    Expected Outcomes Short Term Goal: Understand basic principles of dietary content, such as calories, fat, sodium, cholesterol and nutrients.             Nutrition Discharge:  Nutrition Assessments - 05/14/21 1332       MEDFICTS Scores   Pre Score 69             Education Questionnaire Score:  Knowledge Questionnaire Score - 05/14/21 1335       Knowledge Questionnaire Score   Pre Score 23/24             Pt called on 08/09/2021 to state that needs to be discharged from CR due to back pain. He had attended 33 sessions with consistent attendance, but her injured his back last week getting out of his car, and he is unable to continue rehab due to this.

## 2021-08-09 NOTE — Discharge Instructions (Addendum)
Continue taking your Tylenol, muscle relaxant, use Voltaren gel at home.  I recommend that you follow-up with the neurosurgeon as we discussed for further evaluation definitive care.  Your blood glucose may be elevated for the next few days continue to monitor and take your insulin as described.  Additionally monitor your blood pressure.  If your symptoms worsen, develop new numbness, tingling please return for further evaluation and imaging.

## 2021-08-09 NOTE — ED Provider Notes (Signed)
[facility] EMERGENCY DEPARTMENT Provider Note   CSN: [id number] Arrival date & time: 08/09/21  1112     History  Chief Complaint  Patient presents with   Leg Pain    right    [name] is a 66 y.o. male with past medical history significant for bilateral hip replacement, right knee replacement, congestive heart failure, ICD, previous MI who presents with concern for right leg pain with some numbness, sharp tingling with bending over, leg elevation.  Patient reports he has been trying Tylenol, muscle relaxant at home with minimal relief.  Patient reports he has some known spinal stenosis, and canal narrowing in the lumbar spine.  He reports that he was straining to get into his car a few weeks ago which he thinks is what exacerbated it, he denies recent fall or injury.  Reports that he had a fall around 3 months ago but does not have any pain afterwards.  He is able to ambulate with some difficulty.   Leg Pain Associated symptoms: back pain        Home Medications Prior to Admission medications   Medication Sig Start Date End Date Taking? Authorizing Provider  oxyCODONE-acetaminophen (PERCOCET/ROXICET) 5-325 MG tablet Take 1 tablet by mouth every 6 (six) hours as needed for severe pain. 08/09/21  Yes Titilayo Hagans H, PA-C  atorvastatin (LIPITOR) 80 MG tablet Take 80 mg by mouth at bedtime.    [provider]  carboxymethylcellulose (REFRESH PLUS) 0.5 % SOLN Place 1 drop into both eyes 3 (three) times daily as needed (dry eyes).    [provider]  carvedilol (COREG) 6.25 MG tablet Take 1 tablet (6.25 mg total) by mouth 2 (two) times daily with a meal. 07/24/20   [place], Maricela Bo, FNP  clopidogrel (PLAVIX) 75 MG tablet Take 1 tablet (75 mg total) by mouth daily. 06/01/21   Larey Dresser, MD  cyclobenzaprine (FLEXERIL) 5 MG tablet Take 5 mg by mouth as needed for muscle spasms.    [provider]  diclofenac sodium (VOLTAREN) 1 % GEL Apply 4 g  topically 3 (three) times daily as needed (pain). 06/10/20   Love, Ivan Anchors, PA-C  docusate sodium (COLACE) 100 MG capsule Take 1 capsule (100 mg total) by mouth 2 (two) times daily. 06/10/20   Love, Ivan Anchors, PA-C  empagliflozin (JARDIANCE) 25 MG TABS tablet Take 12.5 mg by mouth daily.    [provider]  famotidine (PEPCID) 40 MG tablet Take 40 mg by mouth daily as needed for heartburn or indigestion.    [provider]  furosemide (LASIX) 20 MG tablet Take 1 tablet (20 mg total) by mouth daily as needed for fluid or edema. 07/24/20 07/24/21  Rafael Bihari, FNP  gabapentin (NEURONTIN) 400 MG capsule Take 400-800 mg by mouth See admin instructions. Takes '400mg'$  three times a day as needed for nerve pain. Take '800mg'$  every night at bedtime.    [provider]  icosapent Ethyl (VASCEPA) 1 g capsule Take 2 capsules (2 g total) by mouth 2 (two) times daily. 03/03/21   Larey Dresser, MD  insulin aspart (NOVOLOG) 100 UNIT/ML injection Inject 35-50 Units into the skin 3 (three) times daily before meals. 35 units in AM, 35 units at [place], and 50 units at supper    [provider]  insulin glargine (LANTUS) 100 UNIT/ML injection Inject 80 Units into the skin at bedtime.    [provider]  loratadine (CLARITIN) 10 MG tablet Take  10 mg by mouth daily.    [provider]  Multiple Vitamins-Minerals (MULTIVITAMIN WITH MINERALS) tablet Take 1 tablet by mouth daily.    [provider]  nitroGLYCERIN (NITROSTAT) 0.4 MG SL tablet Place 1 tablet (0.4 mg total) under the tongue every 5 (five) minutes x 3 doses as needed for chest pain. Call MD if you need two or more doses 06/10/20   Love, Ivan Anchors, PA-C  pantoprazole (PROTONIX) 40 MG tablet Take 40 mg by mouth 2 (two) times daily before a meal.    [provider]  Polyethyl Glycol-Propyl Glycol (SYSTANE) 0.4-0.3 % GEL ophthalmic gel Place 1 application. into both eyes every 6 (six) hours as needed (dry  eyes).    [provider]  Semaglutide (OZEMPIC, 0.25 OR 0.5 MG/DOSE, [place]) Inject into the skin.    [provider]      Allergies    Naproxen, Toradol [ketorolac tromethamine], Codeine, and Morphine and related    Review of Systems   Review of Systems  Musculoskeletal:  Positive for back pain.  All other systems reviewed and are negative.   Physical Exam Updated Vital Signs BP 123/73 (BP Location: Right Arm)   Pulse 84   Temp 97.9 F (36.6 C) (Oral)   Resp 18   Ht 6' (1.829 m)   Wt (!) 140.6 kg   SpO2 98%   BMI 42.04 kg/m  Physical Exam Vitals and nursing note reviewed.  Constitutional:      General: He is not in acute distress.    Appearance: Normal appearance.  HENT:     Head: Normocephalic and atraumatic.  Eyes:     General:        Right eye: No discharge.        Left eye: No discharge.  Cardiovascular:     Rate and Rhythm: Normal rate and regular rhythm.     Pulses: Normal pulses.  Pulmonary:     Effort: Pulmonary effort is normal. No respiratory distress.  Musculoskeletal:        General: No deformity.     Comments: Tenderness palpation in the right lumbar paraspinous muscles.  No midline tenderness.  He has positive straight leg raise on the right.  Intact strength 5 out of 5 bilateral upper and lower extremity.  Patient has no significant pain with bent over stance but as soon as he tries to straighten back he has pain.  Some concern for spinal stenosis changes.  Skin:    General: Skin is warm and dry.     Capillary Refill: Capillary refill takes less than 2 seconds.  Neurological:     Mental Status: He is alert and oriented to person, place, and time.  Psychiatric:        Mood and Affect: Mood normal.        Behavior: Behavior normal.     ED Results / Procedures / Treatments   Labs (all labs ordered are listed, but only abnormal results are displayed) Labs Reviewed - No data to display  EKG None  Radiology No results  found.  Procedures Procedures    Medications Ordered in ED Medications  dexamethasone (DECADRON) injection 10 mg (10 mg Intramuscular Given 08/09/21 1310)  oxyCODONE-acetaminophen (PERCOCET/ROXICET) 5-325 MG per tablet 1 tablet (1 tablet Oral Given 08/09/21 1310)    ED Course/ Medical Decision Making/ A&P  Medical Decision Making Risk Prescription drug management.   Patient with back pain.  My emergent differential diagnosis includes slipped disc, compression fracture, spondylolisthesis, less clinical concern for epidural abscess or osteomyelitis based on patient history.  Additionally especially in this patient considered spinal stenosis, changes related to sciatica.  No neurological deficits, does have positive straight leg raise on right. Patient is ambulatory. No warning symptoms of back pain including: fecal incontinence, urinary retention or overflow incontinence, night sweats, waking from sleep with back pain, unexplained fevers or weight loss, h/o cancer, IVDU, recent trauma. No concern for cauda equina, epidural abscess, or other emergent cause of back pain.  Given this work-up, evaluation, physical exam I do not believe that radiographic imaging is indicated at this time.  Conservative measures such as rest, ice/heat, ibuprofen, Tylenol indicated with neurosurgical follow-up.  Based on patient's symptoms think it be reasonable to try systemic steroids, discussed oral steroids versus IM Decadron x1.  Patient reports that his blood sugar and blood pressure have been under good control recently.   Patient discharged in stable condition with short prescription for pain medication, and Decadron as well.  We will give follow-up with neurosurgery.  Patient understands agrees to plan, is discharged in stable condition.  He is ambulatory at time of discharge.  Final Clinical Impression(s) / ED Diagnoses Final diagnoses:  Acute right-sided low back pain with right-sided  sciatica  Spinal stenosis of lumbosacral region    Rx / DC Orders ED Discharge Orders          Ordered    oxyCODONE-acetaminophen (PERCOCET/ROXICET) 5-325 MG tablet  Every 6 hours PRN        08/09/21 1303              Kuzey Ogata, [place] H, PA-C 08/09/21 1314    Jeanell Sparrow, DO 08/13/21 [phone]

## 2021-09-03 ENCOUNTER — Ambulatory Visit (INDEPENDENT_AMBULATORY_CARE_PROVIDER_SITE_OTHER): Payer: No Typology Code available for payment source

## 2021-09-03 DIAGNOSIS — I469 Cardiac arrest, cause unspecified: Secondary | ICD-10-CM

## 2021-09-06 ENCOUNTER — Other Ambulatory Visit: Payer: Self-pay

## 2021-09-06 ENCOUNTER — Emergency Department (HOSPITAL_COMMUNITY)
Admission: EM | Admit: 2021-09-06 | Discharge: 2021-09-06 | Disposition: A | Discharge status: Home / Self Care | Payer: No Typology Code available for payment source | Attending: Emergency Medicine | Admitting: Emergency Medicine

## 2021-09-06 ENCOUNTER — Emergency Department (HOSPITAL_COMMUNITY): Payer: No Typology Code available for payment source

## 2021-09-06 ENCOUNTER — Encounter (HOSPITAL_COMMUNITY): Payer: Self-pay | Admitting: Emergency Medicine

## 2021-09-06 DIAGNOSIS — I509 Heart failure, unspecified: Secondary | ICD-10-CM | POA: Diagnosis not present

## 2021-09-06 DIAGNOSIS — R202 Paresthesia of skin: Secondary | ICD-10-CM | POA: Diagnosis not present

## 2021-09-06 DIAGNOSIS — E119 Type 2 diabetes mellitus without complications: Secondary | ICD-10-CM | POA: Diagnosis not present

## 2021-09-06 DIAGNOSIS — Z95810 Presence of automatic (implantable) cardiac defibrillator: Secondary | ICD-10-CM | POA: Insufficient documentation

## 2021-09-06 DIAGNOSIS — R42 Dizziness and giddiness: Secondary | ICD-10-CM | POA: Insufficient documentation

## 2021-09-06 DIAGNOSIS — Z7984 Long term (current) use of oral hypoglycemic drugs: Secondary | ICD-10-CM | POA: Insufficient documentation

## 2021-09-06 DIAGNOSIS — Z794 Long term (current) use of insulin: Secondary | ICD-10-CM | POA: Insufficient documentation

## 2021-09-06 DIAGNOSIS — R531 Weakness: Secondary | ICD-10-CM | POA: Insufficient documentation

## 2021-09-06 DIAGNOSIS — R110 Nausea: Secondary | ICD-10-CM | POA: Diagnosis not present

## 2021-09-06 LAB — CUP PACEART REMOTE DEVICE CHECK
Battery Remaining Longevity: 120 mo
Battery Voltage: 3.02 V
Brady Statistic AP VP Percent: 0.01 %
Brady Statistic AP VS Percent: 7.74 %
Brady Statistic AS VP Percent: 0.05 %
Brady Statistic AS VS Percent: 92.19 %
Brady Statistic RA Percent Paced: 7.73 %
Brady Statistic RV Percent Paced: 0.07 %
Date Time Interrogation Session: 20230901012403
HighPow Impedance: 88 Ohm
Implantable Lead Implant Date: 20220526
Implantable Lead Implant Date: 20220526
Implantable Lead Location: 753859
Implantable Lead Location: 753860
Implantable Lead Model: 5076
Implantable Pulse Generator Implant Date: 20220526
Lead Channel Impedance Value: 418 Ohm
Lead Channel Impedance Value: 475 Ohm
Lead Channel Impedance Value: 532 Ohm
Lead Channel Pacing Threshold Amplitude: 0.375 V
Lead Channel Pacing Threshold Amplitude: 0.75 V
Lead Channel Pacing Threshold Pulse Width: 0.4 ms
Lead Channel Pacing Threshold Pulse Width: 0.4 ms
Lead Channel Sensing Intrinsic Amplitude: 2 mV
Lead Channel Sensing Intrinsic Amplitude: 2 mV
Lead Channel Sensing Intrinsic Amplitude: 5.75 mV
Lead Channel Sensing Intrinsic Amplitude: 5.75 mV
Lead Channel Setting Pacing Amplitude: 1.5 V
Lead Channel Setting Pacing Amplitude: 2 V
Lead Channel Setting Pacing Pulse Width: 0.4 ms
Lead Channel Setting Sensing Sensitivity: 0.3 mV

## 2021-09-06 LAB — URINALYSIS, ROUTINE W REFLEX MICROSCOPIC
Bacteria, UA: NONE SEEN
Bilirubin Urine: NEGATIVE
Glucose, UA: >=500 mg/dL — AB
Hgb urine dipstick: NEGATIVE
Ketones, ur: NEGATIVE mg/dL
Leukocytes,Ua: NEGATIVE
Nitrite: NEGATIVE
Protein, ur: NEGATIVE mg/dL
Specific Gravity, Urine: 1.013 (ref 1.005–1.030)
pH: 6 (ref 5.0–8.0)

## 2021-09-06 LAB — BASIC METABOLIC PANEL
Anion gap: 8 (ref 5–15)
BUN: 18 mg/dL (ref 8–23)
CO2: 28 mmol/L (ref 22–32)
Calcium: 8.8 mg/dL — ABNORMAL LOW (ref 8.9–10.3)
Chloride: 104 mmol/L (ref 98–111)
Creatinine, Ser: 1.03 mg/dL (ref 0.61–1.24)
GFR, Estimated: >60 mL/min (ref >60)
Glucose, Bld: 133 mg/dL — ABNORMAL HIGH (ref 70–99)
Potassium: 4 mmol/L (ref 3.5–5.1)
Sodium: 140 mmol/L (ref 135–145)

## 2021-09-06 LAB — CBC
HCT: 49.5 % (ref 39.0–52.0)
Hemoglobin: 16.1 g/dL (ref 13.0–17.0)
MCH: 29 pg (ref 26.0–34.0)
MCHC: 32.5 g/dL (ref 30.0–36.0)
MCV: 89.2 fL (ref 80.0–100.0)
Platelets: 175 10*3/uL (ref 150–400)
RBC: 5.55 MIL/uL (ref 4.22–5.81)
RDW: 14.5 % (ref 11.5–15.5)
WBC: 6.4 10*3/uL (ref 4.0–10.5)
nRBC: 0 % (ref 0.0–0.2)

## 2021-09-06 LAB — TROPONIN I (HIGH SENSITIVITY)
Troponin I (High Sensitivity): 3 ng/L (ref <18)
Troponin I (High Sensitivity): 3 ng/L (ref <18)

## 2021-09-06 MED ORDER — SODIUM CHLORIDE 0.9 % IV BOLUS
500.0000 mL | Freq: Once | INTRAVENOUS | Status: AC
Start: 2021-09-06 — End: 2021-09-06
  Administered 2021-09-06: 500 mL via INTRAVENOUS

## 2021-09-06 NOTE — ED Provider Notes (Signed)
[facility] EMERGENCY DEPARTMENT Provider Note   CSN: [id number] Arrival date & time: 09/06/21  1622     History  Chief Complaint  Patient presents with   Dizziness    [name] is a 66 y.o. male.  He has a history of MI, AICD, diabetes, CHF.  He said today he has been having dizziness which she described as both lightheadedness and also the floor being wavy.  It seems to be worse when he stands and exerts himself climbing a flight of stairs.  He feels his symptoms are moderately improved since he has been laying in the bed.  He states he may have been drinking a little less fluids recently.  No chest pain no shortness of breath, no blurry vision double vision difficulty speaking.  He has some chronic numbness and weakness no change in that.  No vomiting or diarrhea.  The history is provided by the patient.  Dizziness Quality:  Lightheadedness and imbalance Severity:  Moderate Onset quality:  Gradual Duration:  4 hours Timing:  Intermittent Progression:  Partially resolved Chronicity:  New Context: physical activity and standing up   Relieved by:  Lying down Worsened by:  Standing up Ineffective treatments:  None tried Associated symptoms: nausea   Associated symptoms: no chest pain, no headaches, no shortness of breath, no vision changes and no vomiting        Home Medications Prior to Admission medications   Medication Sig Start Date End Date Taking? Authorizing Provider  atorvastatin (LIPITOR) 80 MG tablet Take 80 mg by mouth at bedtime.    [provider]  carboxymethylcellulose (REFRESH PLUS) 0.5 % SOLN Place 1 drop into both eyes 3 (three) times daily as needed (dry eyes).    [provider]  carvedilol (COREG) 6.25 MG tablet Take 1 tablet (6.25 mg total) by mouth 2 (two) times daily with a meal. 07/24/20   [place], Maricela Bo, FNP  clopidogrel (PLAVIX) 75 MG tablet Take 1 tablet (75 mg total) by mouth daily. 06/01/21   Larey Dresser, MD   cyclobenzaprine (FLEXERIL) 5 MG tablet Take 5 mg by mouth as needed for muscle spasms.    [provider]  diclofenac sodium (VOLTAREN) 1 % GEL Apply 4 g topically 3 (three) times daily as needed (pain). 06/10/20   Love, Ivan Anchors, PA-C  docusate sodium (COLACE) 100 MG capsule Take 1 capsule (100 mg total) by mouth 2 (two) times daily. 06/10/20   Love, Ivan Anchors, PA-C  empagliflozin (JARDIANCE) 25 MG TABS tablet Take 12.5 mg by mouth daily.    [provider]  famotidine (PEPCID) 40 MG tablet Take 40 mg by mouth daily as needed for heartburn or indigestion.    [provider]  furosemide (LASIX) 20 MG tablet Take 1 tablet (20 mg total) by mouth daily as needed for fluid or edema. 07/24/20 07/24/21  Rafael Bihari, FNP  gabapentin (NEURONTIN) 400 MG capsule Take 400-800 mg by mouth See admin instructions. Takes '400mg'$  three times a day as needed for nerve pain. Take '800mg'$  every night at bedtime.    [provider]  icosapent Ethyl (VASCEPA) 1 g capsule Take 2 capsules (2 g total) by mouth 2 (two) times daily. 03/03/21   Larey Dresser, MD  insulin aspart (NOVOLOG) 100 UNIT/ML injection Inject 35-50 Units into the skin 3 (three) times daily before meals. 35 units in AM, 35 units at Noon, and 50 units at supper    [provider]  insulin glargine (LANTUS) 100 UNIT/ML injection Inject 80 Units into the skin at bedtime.    [provider]  loratadine (CLARITIN) 10 MG tablet Take 10 mg by mouth daily.    [provider]  Multiple Vitamins-Minerals (MULTIVITAMIN WITH MINERALS) tablet Take 1 tablet by mouth daily.    [provider]  nitroGLYCERIN (NITROSTAT) 0.4 MG SL tablet Place 1 tablet (0.4 mg total) under the tongue every 5 (five) minutes x 3 doses as needed for chest pain. Call MD if you need two or more doses 06/10/20   Love, Ivan Anchors, PA-C  oxyCODONE-acetaminophen (PERCOCET/ROXICET) 5-325 MG tablet Take 1 tablet by mouth every 6 (six)  hours as needed for severe pain. 08/09/21   Prosperi, Christian H, PA-C  pantoprazole (PROTONIX) 40 MG tablet Take 40 mg by mouth 2 (two) times daily before a meal.    [provider]  Polyethyl Glycol-Propyl Glycol (SYSTANE) 0.4-0.3 % GEL ophthalmic gel Place 1 application. into both eyes every 6 (six) hours as needed (dry eyes).    [provider]  Semaglutide (OZEMPIC, 0.25 OR 0.5 MG/DOSE, [place]) Inject into the skin.    [provider]      Allergies    Naproxen, Toradol [ketorolac tromethamine], Codeine, and Morphine and related    Review of Systems   Review of Systems  Constitutional:  Negative for fever.  HENT:  Negative for sore throat.   Eyes:  Negative for visual disturbance.  Respiratory:  Negative for shortness of breath.   Cardiovascular:  Negative for chest pain.  Gastrointestinal:  Positive for nausea. Negative for abdominal pain and vomiting.  Genitourinary:  Negative for dysuria.  Musculoskeletal:  Positive for back pain (chronic).  Skin:  Negative for rash.  Neurological:  Positive for dizziness and light-headedness. Negative for syncope, speech difficulty and headaches.    Physical Exam Updated Vital Signs BP 118/79 (BP Location: Right Arm)   Pulse 85   Resp 20   Ht 6' (1.829 m)   Wt (!) 140.6 kg   SpO2 93%   BMI 42.04 kg/m  Physical Exam Vitals and nursing note reviewed.  Constitutional:      General: He is not in acute distress.    Appearance: Normal appearance. He is well-developed.  HENT:     Head: Normocephalic and atraumatic.  Eyes:     Conjunctiva/sclera: Conjunctivae normal.  Cardiovascular:     Rate and Rhythm: Normal rate and regular rhythm.     Heart sounds: No murmur heard. Pulmonary:     Effort: Pulmonary effort is normal. No respiratory distress.     Breath sounds: Normal breath sounds.  Abdominal:     Palpations: Abdomen is soft.     Tenderness: There is no abdominal tenderness. There is no guarding or rebound.   Musculoskeletal:        General: Normal range of motion.     Cervical back: Neck supple.  Skin:    General: Skin is warm and dry.     Capillary Refill: Capillary refill takes less than 2 seconds.  Neurological:     General: No focal deficit present.     Mental Status: He is alert and oriented to person, place, and time.     Cranial Nerves: No cranial nerve deficit.     Sensory: No sensory deficit.     Motor: No weakness.     Gait: Gait normal.     ED Results / Procedures / Treatments   Labs (all labs ordered  are listed, but only abnormal results are displayed) Labs Reviewed  BASIC METABOLIC PANEL - Abnormal; Notable for the following components:      Result Value   Glucose, Bld 133 (*)    Calcium 8.8 (*)    All other components within normal limits  URINALYSIS, ROUTINE W REFLEX MICROSCOPIC - Abnormal; Notable for the following components:   Glucose, UA >=500 (*)    All other components within normal limits  CBC  TROPONIN I (HIGH SENSITIVITY)  TROPONIN I (HIGH SENSITIVITY)    EKG EKG Interpretation  Date/Time:  Monday September 06 2021 16:31:35 EDT Ventricular Rate:  80 PR Interval:  218 QRS Duration: 158 QT Interval:  424 QTC Calculation: 489 R Axis:   265 Text Interpretation: Sinus rhythm with 1st degree A-V block Right bundle branch block Possible Lateral infarct , age undetermined Inferior infarct (cited on or before 26-Feb-2021) Abnormal ECG When compared with ECG of 26-Feb-2021 15:16, No significant change since last tracing Confirmed by Aletta Edouard [phone]) on 09/06/2021 4:40:33 PM  Radiology DG Chest Port 1 View  Result Date: 09/06/2021 CLINICAL DATA:  Dizziness EXAM: PORTABLE CHEST 1 VIEW COMPARISON:  05/29/2020 FINDINGS: Cardiomegaly. Left chest multi lead pacer defibrillator. Both lungs are clear. The visualized skeletal structures are unremarkable. IMPRESSION: Cardiomegaly without acute abnormality of the lungs in AP portable projection. Electronically  Signed   By: Delanna Ahmadi M.D.   On: 09/06/2021 18:59    Procedures Procedures    Medications Ordered in ED Medications  sodium chloride 0.9 % bolus 500 mL (has no administration in time range)    ED Course/ Medical Decision Making/ A&P Clinical Course as of 09/07/21 1123  Mon Sep 06, 2021  2228 Patient states he feels back to baseline after receiving IV fluids.  His second troponin seems to be hung up at the lab.  He said he needs to get home and I think it is reasonable.  He will follow-up with his regular doctor and return if any worsening symptoms. [MB]    Clinical Course User Index [MB] Hayden Rasmussen, MD                           Medical Decision Making Amount and/or Complexity of Data Reviewed Labs: ordered. Radiology: ordered.   This patient complains of dizziness lightheadedness; this involves an extensive number of treatment Options and is a complaint that carries with it a high risk of complications and morbidity. The differential includes vertigo, stroke, dehydration, metabolic derangement, arrhythmia, ACS  I ordered, reviewed and interpreted labs, which included CBC normal, chemistries normal other than mildly elevated glucose, troponins flat, urinalysis without signs of infection I ordered medication IV fluids and reviewed PMP when indicated. I ordered imaging studies which included chest x-ray and I independently    visualized and interpreted imaging which showed cardiomegaly Additional history obtained from patient's daughter Previous records obtained and reviewed in epic no recent admissions  Cardiac monitoring reviewed, normal sinus rhythm Social determinants considered, no significant barriers Critical Interventions: None  After the interventions stated above, I reevaluated the patient and found patient to be symptomatically improved.  He has been ambulatory in the department without any further dizziness. Admission and further testing considered, no  indications for admission or further work-up at this time.  Counseled patient to stay hydrated continue his regular medications and follow-up with PCP.  Return instructions discussed         Final Clinical Impression(s) /  ED Diagnoses Final diagnoses:  Dizziness    Rx / DC Orders ED Discharge Orders     None         Hayden Rasmussen, MD 09/07/21 1125

## 2021-09-06 NOTE — ED Triage Notes (Signed)
Pt to the ED with complaints of dizziness for the pat hour or two.

## 2021-09-06 NOTE — Discharge Instructions (Signed)
You were seen in the emergency department for feeling dizzy and lightheaded.  You had blood work EKG and chest x-ray that did not show an obvious explanation for your symptoms.  You felt better after some IV fluids.  Please continue your regular medications and follow-up with your primary care doctor.  Return to the emergency department if any worsening or concerning symptoms.

## 2021-09-06 NOTE — ED Notes (Signed)
Pt ambulated to bathroom with out any dizziness. States that he feels much better after the fluids he received.

## 2021-09-19 NOTE — Progress Notes (Unsigned)
Cardiology Office Note Date:  09/19/2021  Patient ID:  [name], [name] [birth year], MRN [id number] PCP:  [facility], [facility]  Cardiologist:  Dr. Aundra Dubin Electrophysiologist: Dr. Quentin Ore  ***refresh   Chief Complaint: *** 9 mo  History of Present Illness: [name] is a 66 y.o. male with history of HTN, HLD, DM, CAD, ICM, VF arrest (May 2022), OSA, chronic CHF (diastolic)  Saw Dr. Quentin Ore Oct 2022, was recovering, continued to gain strength post arrest.   Some NSVT only No changes were made.  Saw Dr. Aundra Dubin May 2023, was doing well, noncardiac CP, primary complaints were orthopedic pains.  Class II symptoms no changes were made.  *** VT? *** volume *** meds, CAD, CM *** symptoms  Device information MDT dual chamber ICD implanted 05/28/2020 Secondary prevention  AAD Hx  Amiodarone post arrest in-pt, developed CHB  > stopped  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    Past Surgical History:  Procedure Laterality Date   APPENDECTOMY     CERVICAL FUSION     CERVICAL LAMINECTOMY     COLONOSCOPY     CORONARY/GRAFT ACUTE MI REVASCULARIZATION N/A 05/19/2020   Procedure: Coronary/Graft Acute MI Revascularization;  Surgeon: Belva Crome, MD;  Location: [facility];  Service: Cardiovascular;  Laterality: N/A;   HAND SURGERY     HARDWARE REMOVAL Left 09/10/2012   Procedure: HARDWARE REMOVAL;  Surgeon: Jessy Oto, MD;  Location: [facility];  Service: Orthopedics;  Laterality: Left;   HIP SURGERY  12/29/09   R THR  [name])   HIP  SURGERY     bilateral   ICD IMPLANT N/A 05/28/2020   Procedure: ICD IMPLANT;  Surgeon: Vickie Epley, MD;  Location: [facility];  Service: Cardiovascular;  Laterality: N/A;   LEFT HEART CATH AND CORONARY ANGIOGRAPHY N/A 05/19/2020   Procedure: LEFT HEART CATH AND CORONARY ANGIOGRAPHY;  Surgeon: Belva Crome, MD;  Location: [facility];  Service: Cardiovascular;  Laterality: N/A;   TOTAL HIP ARTHROPLASTY Left 09/10/2012   Procedure: REMOVAL OF HARDWARE LEFT PROXIMAL THIGH/FEMUR AND LEFT TOTAL HIP ARTHROPLASTY;  Surgeon: Jessy Oto, MD;  Location: [facility];  Service: Orthopedics;  Laterality: Left;   TOTAL KNEE ARTHROPLASTY  01/2008   left knee    Current Outpatient Medications  Medication Sig Dispense Refill   atorvastatin (LIPITOR) 80 MG tablet Take 80 mg by mouth at bedtime.     carboxymethylcellulose (REFRESH PLUS) 0.5 % SOLN Place 1 drop into both eyes 3 (three) times daily as needed (dry eyes).     clopidogrel (PLAVIX) 75 MG tablet Take 1 tablet (75 mg total) by mouth daily. 90 tablet 3   cyclobenzaprine (FLEXERIL) 5 MG tablet Take 5 mg by mouth as needed for muscle spasms.     diclofenac sodium (VOLTAREN) 1 % GEL Apply 4 g topically 3 (three) times daily as needed (pain).  350 g 0   docusate sodium (COLACE) 100 MG capsule Take 1 capsule (100 mg total) by mouth 2 (two) times daily. 28 capsule 0   empagliflozin (JARDIANCE) 25 MG TABS tablet Take 12.5 mg by mouth daily.     famotidine (PEPCID) 40 MG tablet Take 40 mg by mouth daily as needed for heartburn or indigestion.     furosemide (LASIX) 20 MG tablet Take 1 tablet (20 mg total) by mouth daily as needed for fluid or edema. 30 tablet 2   gabapentin (NEURONTIN) 400 MG capsule Take 400-800 mg by mouth 4 (four) times daily.     icosapent Ethyl (VASCEPA) 1 g capsule Take 2 capsules (2 g total) by mouth 2 (two) times daily. (Patient not taking: Reported on 09/06/2021) 120 capsule 11   insulin aspart (NOVOLOG) 100 UNIT/ML  injection Inject 35-50 Units into the skin 3 (three) times daily before meals. 35 units in AM, 35 units at Noon, and 40 units at supper sliding scale     insulin glargine (LANTUS) 100 UNIT/ML injection Inject 80 Units into the skin at bedtime.     loratadine (CLARITIN) 10 MG tablet Take 10 mg by mouth daily.     Multiple Vitamins-Minerals (MULTIVITAMIN WITH MINERALS) tablet Take 1 tablet by mouth daily.     nitroGLYCERIN (NITROSTAT) 0.4 MG SL tablet Place 1 tablet (0.4 mg total) under the tongue every 5 (five) minutes x 3 doses as needed for chest pain. Call MD if you need two or more doses 30 tablet 12   oxyCODONE-acetaminophen (PERCOCET/ROXICET) 5-325 MG tablet Take 1 tablet by mouth every 6 (six) hours as needed for severe pain. 10 tablet 0   pantoprazole (PROTONIX) 40 MG tablet Take 40 mg by mouth 2 (two) times daily before a meal.     Polyethyl Glycol-Propyl Glycol (SYSTANE) 0.4-0.3 % GEL ophthalmic gel Place 1 application. into both eyes every 6 (six) hours as needed (dry eyes).     Semaglutide (OZEMPIC, 0.25 OR 0.5 MG/DOSE, [place]) Inject into the skin.     No current facility-administered medications for this visit.    Allergies:   Naproxen, Toradol [ketorolac tromethamine], Codeine, and Morphine and related   Social History:  The patient  reports that he quit smoking about 8 years ago. His smoking use included cigarettes. He has a 63.00 pack-year smoking history. He has quit using smokeless tobacco.  His smokeless tobacco use included chew. He reports that he does not drink alcohol and does not use drugs.   Family History:  The patient's family history includes Alcohol abuse in his brother; Cancer in his mother; Colon polyps in his brother; Crohn's disease in his father.  ROS:  Please see the history of present illness.    All other systems are reviewed and otherwise negative.   PHYSICAL EXAM:  VS:  There were no vitals taken for this visit. BMI: There is no height or weight on file to  calculate BMI. Well nourished, well developed, in no acute distress HEENT: normocephalic, atraumatic Neck: no JVD, carotid bruits or masses Cardiac:  *** RRR; no significant murmurs, no rubs, or gallops Lungs:  *** CTA b/l, no wheezing, rhonchi or rales Abd: soft, nontender MS: no deformity or *** atrophy Ext: *** no edema Skin: warm and dry, no rash Neuro:  No gross deficits appreciated Psych: euthymic mood, full affect  *** ICD site is stable, no tethering or discomfort   EKG:  not done today  Device interrogation done today and reviewed  by myself:  ***   02/26/2021: TTE 1. Left ventricular ejection fraction, by estimation, is 60 to 65%. The  left ventricle has normal function. The left ventricle has no regional  wall motion abnormalities. Left ventricular diastolic parameters are  consistent with Grade I diastolic  dysfunction (impaired relaxation).   2. Right ventricular systolic function is normal. The right ventricular  size is normal.   3. The mitral valve is normal in structure. No evidence of mitral valve  regurgitation. No evidence of mitral stenosis.   4. The aortic valve is normal in structure. Aortic valve regurgitation is  not visualized. No aortic stenosis is present.   5. The inferior vena cava is normal in size with greater than 50%  respiratory variability, suggesting right atrial pressure of 3 mmHg.   Comparison(s): No significant change from prior study. Prior images  reviewed side by side.    05/19/2020; LHC 100% occlusion of the mid RCA, likely representing a chronic total occlusion given absence of thrombus staining in the area of occlusion and well-formed left-to-right collaterals. Widely patent left main Widely patent LAD Widely patent circumflex LVEDP 14 initially and following angiography and IV fluid 20 mmHg   RECOMMENDATIONS:   IV dobutamine to improve potential RV dysfunction. IV fluids as tolerated A central line will be placed in the  unit to measure co-oximetry and get right heart filling pressures. Continue Levophed, epinephrine, IV fentanyl, and intermittent Versed as needed.  Wean Levophed and epinephrine as tolerated. Advanced heart failure team will help manage. Critical care team, Dr. Doyne Keel managing primarily.   05/20/2020: TTE 1. Left ventricular ejection fraction, by estimation, is 40 to 45%. The  left ventricle has mildly decreased function. The left ventricle  demonstrates regional wall motion abnormalities (basal and mid  inferolateral suggestive of RCA disease). There is  mild concentric left ventricular hypertrophy. Left ventricular diastolic  parameters are indeterminate.   2. Right ventricular systolic function is hyperdynamic. The right  ventricular size is normal.   3. A small pericardial effusion is present.   4. The mitral valve is grossly normal. No evidence of mitral valve  regurgitation.   5. The aortic valve was not well visualized. Aortic valve regurgitation  is not visualized.   6. Aortic dilatation noted. There is borderline dilatation of the aortic  root, measuring 38 mm.   7. The inferior vena cava is dilated in size with <50% respiratory  variability, suggesting right atrial pressure of 15 mmHg.   Comparison(s): No prior Echocardiogram.   Recent Labs: 09/06/2021: BUN 18; Creatinine, Ser 1.03; Hemoglobin 16.1; Platelets 175; Potassium 4.0; Sodium 140  06/01/2021: Cholesterol 154; HDL 31; LDL Cholesterol 71; Total CHOL/HDL Ratio 5.0; Triglycerides 262; VLDL 52   Estimated Creatinine Clearance: 99.8 mL/min (by C-G formula based on SCr of 1.03 mg/dL).   Wt Readings from Last 3 Encounters:  09/06/21 (!) 310 lb (140.6 kg)  08/09/21 (!) 310 lb (140.6 kg)  08/02/21 (!) 311 lb 11.7 oz (141.4 kg)     Other studies reviewed: Additional studies/records reviewed today include: summarized above  ASSESSMENT AND PLAN:  ICD ***  C.arrest ***  CAD ***  ICM Chronic CHF (recovered LVEF  diastolic) *** C/w dr. Aundra Dubin    Disposition: F/u with ***  Current medicines are reviewed at length with the patient today.  The patient did not have any concerns regarding medicines.  Venetia Night, PA-C 09/19/2021 4:29 PM     [address]  [place] [address] [phone] (office)  [phone] (fax)

## 2021-09-21 ENCOUNTER — Ambulatory Visit: Payer: No Typology Code available for payment source | Attending: Physician Assistant | Admitting: Physician Assistant

## 2021-09-21 ENCOUNTER — Encounter: Payer: Self-pay | Admitting: Physician Assistant

## 2021-09-21 VITALS — BP 110/60 | HR 84 | Ht 72.0 in | Wt 309.0 lb

## 2021-09-21 DIAGNOSIS — I469 Cardiac arrest, cause unspecified: Secondary | ICD-10-CM

## 2021-09-21 DIAGNOSIS — I2510 Atherosclerotic heart disease of native coronary artery without angina pectoris: Secondary | ICD-10-CM | POA: Diagnosis not present

## 2021-09-21 DIAGNOSIS — I255 Ischemic cardiomyopathy: Secondary | ICD-10-CM

## 2021-09-21 DIAGNOSIS — Z95810 Presence of automatic (implantable) cardiac defibrillator: Secondary | ICD-10-CM

## 2021-09-21 LAB — CUP PACEART INCLINIC DEVICE CHECK
Battery Remaining Longevity: 119 mo
Battery Voltage: 3.02 V
Brady Statistic AP VP Percent: 0.01 %
Brady Statistic AP VS Percent: 7.92 %
Brady Statistic AS VP Percent: 0.05 %
Brady Statistic AS VS Percent: 92.02 %
Brady Statistic RA Percent Paced: 7.9 %
Brady Statistic RV Percent Paced: 0.07 %
Date Time Interrogation Session: 20230919175059
HighPow Impedance: 86 Ohm
Implantable Lead Implant Date: 20220526
Implantable Lead Implant Date: 20220526
Implantable Lead Location: 753859
Implantable Lead Location: 753860
Implantable Lead Model: 5076
Implantable Pulse Generator Implant Date: 20220526
Lead Channel Impedance Value: 418 Ohm
Lead Channel Impedance Value: 475 Ohm
Lead Channel Impedance Value: 513 Ohm
Lead Channel Pacing Threshold Amplitude: 0.375 V
Lead Channel Pacing Threshold Amplitude: 0.75 V
Lead Channel Pacing Threshold Pulse Width: 0.4 ms
Lead Channel Pacing Threshold Pulse Width: 0.4 ms
Lead Channel Sensing Intrinsic Amplitude: 2.75 mV
Lead Channel Sensing Intrinsic Amplitude: 3 mV
Lead Channel Sensing Intrinsic Amplitude: 5.875 mV
Lead Channel Sensing Intrinsic Amplitude: 6.625 mV
Lead Channel Setting Pacing Amplitude: 1.5 V
Lead Channel Setting Pacing Amplitude: 2 V
Lead Channel Setting Pacing Pulse Width: 0.4 ms
Lead Channel Setting Sensing Sensitivity: 0.3 mV

## 2021-09-21 NOTE — Patient Instructions (Signed)
Medication Instructions:    Your physician recommends that you continue on your current medications as directed. Please refer to the Current Medication list given to you today.   *If you need a refill on your cardiac medications before your next appointment, please call your pharmacy*   Lab Work: NONE ORDERED  TODAY    If you have labs (blood work) drawn today and your tests are completely normal, you will receive your results only by: MyChart Message (if you have MyChart) OR A paper copy in the mail If you have any lab test that is abnormal or we need to change your treatment, we will call you to review the results.   Testing/Procedures: NONE ORDERED  TODAY      Follow-Up: At [facility], you and your health needs are our priority.  As part of our continuing mission to provide you with exceptional heart care, we have created designated Provider Care Teams.  These Care Teams include your primary Cardiologist (physician) and Advanced Practice Providers (APPs -  Physician Assistants and Nurse Practitioners) who all work together to provide you with the care you need, when you need it.  We recommend signing up for the patient portal called "MyChart".  Sign up information is provided on this After Visit Summary.  MyChart is used to connect with patients for Virtual Visits (Telemedicine).  Patients are able to view lab/test results, encounter notes, upcoming appointments, etc.  Non-urgent messages can be sent to your provider as well.   To learn more about what you can do with MyChart, go to [email or web address].    Your next appointment:   1 year(s)  The format for your next appointment:   In Person  Provider:   Cameron Lambert, MD    Other Instructions   Important Information About Sugar       

## 2021-09-22 NOTE — Progress Notes (Signed)
Remote ICD transmission.   

## 2021-10-01 NOTE — Progress Notes (Signed)
Advanced Heart Failure Clinic Note   PCP: [facility] EP: Dr. Quentin Ore HF Cardiologist: Dr. Aundra Dubin  HPI: 66 y.o. male w/ HTN, DM2, HLD, VF arrest, and ischemic cardiomyopathy.    He suffered witnessed out of hospital cardiac arrest 05/19/20 w/ immediate bystander CPR, initiated by wife, and prompt activation of EMS. Per report, had prolonged CPR, more than 45 min prior to ROSC. Underlying rhythm was VFib. Bedside echo showed EF 40-50% inferobasal severe hypokinesis with septal bounce.  RV systolic function normal. 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 No other disease. LM, LAD and LCx all widely patent. No indication for PCI.  Suspect scar-mediated VF.  He required mechanical ventilation and pressor support, but did not require cardiac mechanical support. 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 He received a Medtronic ICD for secondary prevention. He was discharged to CIR.   Echo in 2/23 showed EF 50-55% with inferior hypokinesis, normal RV, and normal IVC.   Today he returns for HF followup.  Weight is up about 7 lbs. He has been on semaglutide for about 3 wks.  He still has occasional noncardiac chest pain.  No dypsnea walking on flat ground.  Main complaint is hip, knee, and back pain.  He has been doing cardiac rehab. He is using CPAP.   Labs (8/22): K 4.5, creatinine 1.03 Labs (11/22): K 4.3, creatinine 1.04 Labs (2/23): TGs 357, LDL 54, K 4.1, creatinine 1.05  Medtronic device interrogation: no VT/AF, fluid index < threshold, stable thoracic impedance.    PMH: 1. Type 2 diabetes 2. GERD 3. HTN 4. Hyperlipidemia.  5. 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  - Medtronic ICD.  6. CAD: LHC 5/22 showed  chronic occlusion of RCA with L=>R collaterals.  7. Transient complete heart block in 5/22.  8. Ischemic cardiomyopathy: Echo post-arrest in 5/22 showed EF 45%, repeat echo in 5/22 showed EF up to 60-65%.  - Echo (2/23): EF 50-55% with inferior hypokinesis, normal RV, and normal IVC.  9. OSA: Uses CPAP.   ROS: All systems negative except as listed in HPI, PMH and Problem List.  SH:  Social History   Socioeconomic History   Marital status: Married    Spouse name: Not on file   Number of children: Not on file   Years of education: Not on file   Highest education level: Not on file  Occupational History   Not on file  Tobacco Use   Smoking status: Former    Packs/day: 1.50    Years: 42.00    Total pack years: 63.00    Types: Cigarettes    Quit date: 09/03/2013    Years since quitting: 8.0   Smokeless tobacco: Former    Types: Chew   Tobacco comments:    We discussed the need to remain smoke free and to call me for help if he has the desire to smoke again.  Vaping Use   Vaping Use: Never used  Substance and Sexual Activity   Alcohol use: No    Alcohol/week: 0.0 standard drinks of alcohol    Comment: recovering alcoholic    Drug use: No   Sexual activity: Not on file  Other Topics Concern   Not on file  Social History Narrative   Recovering alcoholic  Minister    Social Determinants of Health   Financial Resource Strain: Low Risk  (05/29/2020)   Overall Financial Resource Strain (CARDIA)    Difficulty of Paying Living Expenses: Not very hard  Food Insecurity: No Food Insecurity (05/29/2020)   Hunger Vital Sign    Worried About Running Out of Food in the Last Year: Never true    Ran Out of Food in the Last Year: Never true  Transportation Needs: No Transportation Needs (05/29/2020)   PRAPARE - Hydrologist (Medical): No    Lack of Transportation (Non-Medical): No  Physical Activity: Not on file  Stress: Not on file  Social Connections:  Not on file  Intimate Partner Violence: Not on file   FH:  Family History  Problem Relation Age of Onset   Cancer Mother        lung   Crohn's disease Father    Alcohol abuse Brother    Colon polyps Brother    Colon cancer Neg Hx    Current Outpatient Medications  Medication Sig Dispense Refill   atorvastatin (LIPITOR) 80 MG tablet Take 80 mg by mouth at bedtime.     carboxymethylcellulose (REFRESH PLUS) 0.5 % SOLN Place 1 drop into both eyes 3 (three) times daily as needed (dry eyes).     clopidogrel (PLAVIX) 75 MG tablet Take 1 tablet (75 mg total) by mouth daily. 90 tablet 3   cyclobenzaprine (FLEXERIL) 5 MG tablet Take 5 mg by mouth as needed for muscle spasms.     diclofenac sodium (VOLTAREN) 1 % GEL Apply 4 g topically 3 (three) times daily as needed (pain). 350 g 0   docusate sodium (COLACE) 100 MG capsule Take 1 capsule (100 mg total) by mouth 2 (two) times daily. 28 capsule 0   empagliflozin (JARDIANCE) 25 MG TABS tablet Take 12.5 mg by mouth daily.     famotidine (PEPCID) 40 MG tablet Take 40 mg by mouth daily as needed for heartburn or indigestion.     furosemide (LASIX) 20 MG tablet Take 1 tablet (20 mg total) by mouth daily as needed for fluid or edema. 30 tablet 2   gabapentin (NEURONTIN) 400 MG capsule Take 400-800 mg by mouth 4 (four) times daily.     icosapent Ethyl (VASCEPA) 1 g capsule Take 2 capsules (2 g total) by mouth 2 (two) times daily. 120 capsule 11   insulin aspart (NOVOLOG) 100 UNIT/ML injection Inject 35-50 Units into the skin 3 (three) times daily before meals. 35 units in AM, 35 units at Noon, and 40 units at supper sliding scale     insulin glargine (LANTUS) 100 UNIT/ML injection Inject 80 Units into the skin at bedtime.     loratadine (CLARITIN) 10 MG tablet Take 10 mg by mouth daily.     Multiple Vitamins-Minerals (MULTIVITAMIN WITH MINERALS) tablet Take 1 tablet by mouth daily.     nitroGLYCERIN (NITROSTAT) 0.4 MG SL tablet Place 1 tablet (0.4 mg  total) under the tongue every 5 (five) minutes x 3 doses as needed for chest pain. Call MD if you need two or more doses 30 tablet 12   oxyCODONE-acetaminophen (PERCOCET/ROXICET) 5-325 MG tablet Take 1 tablet by mouth every 6 (six) hours as needed for severe pain. 10 tablet 0   pantoprazole (PROTONIX) 40 MG tablet Take 40 mg by mouth 2 (two) times daily before a meal.     Polyethyl Glycol-Propyl Glycol (SYSTANE) 0.4-0.3 % GEL ophthalmic gel Place 1 application.  into both eyes every 6 (six) hours as needed (dry eyes).     Semaglutide (OZEMPIC, 0.25 OR 0.5 MG/DOSE, ) Inject into the skin.     No current facility-administered medications for this visit.   There were no vitals taken for this visit.  Wt Readings from Last 3 Encounters:  09/21/21 (!) 140.2 kg (309 lb)  09/06/21 (!) 140.6 kg (310 lb)  08/09/21 (!) 140.6 kg (310 lb)   PHYSICAL EXAM: General: NAD, obese.  Neck: No JVD, no thyromegaly or thyroid nodule.  Lungs: Clear to auscultation bilaterally with normal respiratory effort. CV: Nondisplaced PMI.  Heart regular S1/S2, no S3/S4, no murmur.  No peripheral edema.  No carotid bruit.  Normal pedal pulses.  Abdomen: Soft, nontender, no hepatosplenomegaly, no distention.  Skin: Intact without lesions or rashes.  Neurologic: Alert and oriented x 3.  Psych: Normal affect. Extremities: No clubbing or cyanosis.  HEENT: Normal.   ASSESSMENT & PLAN:  1. Chronic diastolic CHF: Ischemic cardiomyopathy, EF in the 45% range on initial echo in 5/22 but back up to 60-65% on repeat limited echo also in 5/22.  Echo in 2/23 showed EF 50-55% with inferior hypokinesis.  NYHA class II, not volume overloaded by exam or Optivol.  - Continue Jardiance.  - Continue carvedilol 6.25 mg bid.   - Continue Entresto 24/26 bid.  BMET today.  2. CAD: LHC in 5/22 showed CTO of the mid RCA with good collaterals.  I do not think this was acute MI from plaque rupture.   - Continue Plavix long-term, can stop ASA.   - Continue statin/Vascepa, check lipids today.   3. Cardiac arrest: In 5/22. Suspect scar-mediated from old RCA occlusion. EF now low normal to mildly reduced.  Medtronic ICD for secondary prevention.     4. Diabetes: Per PCP.  5. 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  No further HB. 6. HTN: BP controlled.  7. Obesity: Continue semaglutide.   Follow up in 4 months with APP.   [place] 10/01/2021

## 2021-10-04 ENCOUNTER — Ambulatory Visit (HOSPITAL_COMMUNITY)
Admission: RE | Admit: 2021-10-04 | Discharge: 2021-10-04 | Disposition: A | Discharge status: Home / Self Care | Payer: No Typology Code available for payment source | Source: Ambulatory Visit | Attending: Family Medicine | Admitting: Family Medicine

## 2021-10-04 ENCOUNTER — Encounter (HOSPITAL_COMMUNITY): Payer: Self-pay

## 2021-10-04 VITALS — BP 148/76 | HR 86 | Wt 307.0 lb

## 2021-10-04 DIAGNOSIS — Z8679 Personal history of other diseases of the circulatory system: Secondary | ICD-10-CM | POA: Diagnosis not present

## 2021-10-04 DIAGNOSIS — I2510 Atherosclerotic heart disease of native coronary artery without angina pectoris: Secondary | ICD-10-CM | POA: Diagnosis not present

## 2021-10-04 DIAGNOSIS — Z7901 Long term (current) use of anticoagulants: Secondary | ICD-10-CM | POA: Insufficient documentation

## 2021-10-04 DIAGNOSIS — G4733 Obstructive sleep apnea (adult) (pediatric): Secondary | ICD-10-CM | POA: Diagnosis not present

## 2021-10-04 DIAGNOSIS — Z6841 Body Mass Index (BMI) 40.0 and over, adult: Secondary | ICD-10-CM | POA: Insufficient documentation

## 2021-10-04 DIAGNOSIS — K219 Gastro-esophageal reflux disease without esophagitis: Secondary | ICD-10-CM | POA: Insufficient documentation

## 2021-10-04 DIAGNOSIS — Z8674 Personal history of sudden cardiac arrest: Secondary | ICD-10-CM | POA: Diagnosis not present

## 2021-10-04 DIAGNOSIS — E669 Obesity, unspecified: Secondary | ICD-10-CM | POA: Diagnosis not present

## 2021-10-04 DIAGNOSIS — M545 Low back pain, unspecified: Secondary | ICD-10-CM | POA: Insufficient documentation

## 2021-10-04 DIAGNOSIS — E119 Type 2 diabetes mellitus without complications: Secondary | ICD-10-CM | POA: Diagnosis not present

## 2021-10-04 DIAGNOSIS — I110 Hypertensive heart disease with heart failure: Secondary | ICD-10-CM | POA: Diagnosis not present

## 2021-10-04 DIAGNOSIS — Z794 Long term (current) use of insulin: Secondary | ICD-10-CM | POA: Insufficient documentation

## 2021-10-04 DIAGNOSIS — E785 Hyperlipidemia, unspecified: Secondary | ICD-10-CM | POA: Diagnosis not present

## 2021-10-04 DIAGNOSIS — I255 Ischemic cardiomyopathy: Secondary | ICD-10-CM | POA: Diagnosis not present

## 2021-10-04 DIAGNOSIS — R002 Palpitations: Secondary | ICD-10-CM | POA: Insufficient documentation

## 2021-10-04 DIAGNOSIS — I5032 Chronic diastolic (congestive) heart failure: Secondary | ICD-10-CM | POA: Diagnosis not present

## 2021-10-04 DIAGNOSIS — Z79899 Other long term (current) drug therapy: Secondary | ICD-10-CM | POA: Diagnosis not present

## 2021-10-04 DIAGNOSIS — G8929 Other chronic pain: Secondary | ICD-10-CM | POA: Diagnosis not present

## 2021-10-04 LAB — BASIC METABOLIC PANEL
Anion gap: 7 (ref 5–15)
BUN: 15 mg/dL (ref 8–23)
CO2: 24 mmol/L (ref 22–32)
Calcium: 8.4 mg/dL — ABNORMAL LOW (ref 8.9–10.3)
Chloride: 108 mmol/L (ref 98–111)
Creatinine, Ser: 1.04 mg/dL (ref 0.61–1.24)
GFR, Estimated: >60 mL/min (ref >60)
Glucose, Bld: 110 mg/dL — ABNORMAL HIGH (ref 70–99)
Potassium: 4 mmol/L (ref 3.5–5.1)
Sodium: 139 mmol/L (ref 135–145)

## 2021-10-04 LAB — BRAIN NATRIURETIC PEPTIDE: B Natriuretic Peptide: 12.4 pg/mL (ref 0.0–100.0)

## 2021-10-04 MED ORDER — CARVEDILOL 6.25 MG PO TABS: 3.1250 mg | ORAL_TABLET | Freq: Two times a day (BID) | ORAL | 1 refills | Status: DC

## 2021-10-04 MED ORDER — ENTRESTO 24-26 MG PO TABS: 1.0000 | ORAL_TABLET | Freq: Two times a day (BID) | ORAL | 6 refills | Status: DC

## 2021-10-04 NOTE — Patient Instructions (Signed)
Thank you for coming in today  Labs were done today, if any labs are abnormal the clinic will call you No news is good news  START Carvedilol 3.125 mg 1/2 tablet twice daily   Your physician recommends that you schedule a follow-up appointment in:  4 months with Dr. Aundra Dubin    Do the following things EVERYDAY: Weigh yourself in the morning before breakfast. Write it down and keep it in a log. Take your medicines as prescribed Eat low salt foods--Limit salt (sodium) to 2000 mg per day.  Stay as active as you can everyday Limit all fluids for the day to less than 2 liters  .At the [facility], you and your health needs are our priority. As part of our continuing mission to provide you with exceptional heart care, we have created designated Provider Care Teams. These Care Teams include your primary Cardiologist (physician) and Advanced Practice Providers (APPs- Physician Assistants and Nurse Practitioners) who all work together to provide you with the care you need, when you need it.   You may see any of the following providers on your designated Care Team at your next follow up: Dr Glori Bickers Dr Loralie Champagne Dr. Roxana Hires, NP Lyda Jester, [place] [facility] [place], [place] Forestine Na, NP Audry Riles, PharmD   Please be sure to bring in all your medications bottles to every appointment.   If you have any questions or concerns before your next appointment please send [place] a message through [place] or call our office at [phone].    TO LEAVE A MESSAGE FOR THE NURSE SELECT OPTION 2, PLEASE LEAVE A MESSAGE INCLUDING: YOUR NAME DATE OF BIRTH CALL BACK NUMBER REASON FOR CALL**this is important as we prioritize the call backs  YOU WILL RECEIVE A CALL BACK THE SAME DAY AS LONG AS YOU CALL BEFORE 4:00 PM

## 2021-10-15 ENCOUNTER — Other Ambulatory Visit (HOSPITAL_COMMUNITY): Payer: Self-pay | Admitting: *Deleted

## 2021-10-15 MED ORDER — CARVEDILOL 6.25 MG PO TABS: 3.1250 mg | ORAL_TABLET | Freq: Two times a day (BID) | ORAL | 1 refills | Status: DC

## 2021-10-18 ENCOUNTER — Other Ambulatory Visit (HOSPITAL_COMMUNITY): Payer: Self-pay | Admitting: *Deleted

## 2021-10-27 ENCOUNTER — Telehealth (HOSPITAL_COMMUNITY): Payer: Self-pay | Admitting: *Deleted

## 2021-10-27 NOTE — Telephone Encounter (Signed)
OK to hold Plavix x 5 days for procedure.  

## 2021-10-27 NOTE — Telephone Encounter (Signed)
Pt aware and will call back with fax number to fax order,

## 2021-10-27 NOTE — Telephone Encounter (Signed)
Pt left vm asking if it was ok to hold plavix before getting an epidural steroid injection.   Routed to [place] for advice

## 2021-10-29 ENCOUNTER — Other Ambulatory Visit (HOSPITAL_COMMUNITY): Payer: Self-pay | Admitting: Cardiology

## 2021-10-29 NOTE — Telephone Encounter (Signed)
[facility]

## 2021-11-05 ENCOUNTER — Telehealth (HOSPITAL_COMMUNITY): Payer: Self-pay

## 2021-11-05 NOTE — Telephone Encounter (Signed)
Surgical clearance for [facility] faxed.

## 2021-12-03 ENCOUNTER — Ambulatory Visit (INDEPENDENT_AMBULATORY_CARE_PROVIDER_SITE_OTHER): Payer: No Typology Code available for payment source

## 2021-12-03 DIAGNOSIS — I255 Ischemic cardiomyopathy: Secondary | ICD-10-CM

## 2021-12-04 LAB — CUP PACEART REMOTE DEVICE CHECK
Battery Remaining Longevity: 117 mo
Battery Voltage: 3.02 V
Brady Statistic AP VP Percent: 0.03 %
Brady Statistic AP VS Percent: 12.76 %
Brady Statistic AS VP Percent: 0.05 %
Brady Statistic AS VS Percent: 87.17 %
Brady Statistic RA Percent Paced: 12.67 %
Brady Statistic RV Percent Paced: 0.07 %
Date Time Interrogation Session: 20231201012305
HighPow Impedance: 96 Ohm
Implantable Lead Connection Status: 753985
Implantable Lead Connection Status: 753985
Implantable Lead Implant Date: 20220526
Implantable Lead Implant Date: 20220526
Implantable Lead Location: 753859
Implantable Lead Location: 753860
Implantable Lead Model: 5076
Implantable Pulse Generator Implant Date: 20220526
Lead Channel Impedance Value: 418 Ohm
Lead Channel Impedance Value: 475 Ohm
Lead Channel Impedance Value: 532 Ohm
Lead Channel Pacing Threshold Amplitude: 0.375 V
Lead Channel Pacing Threshold Amplitude: 0.625 V
Lead Channel Pacing Threshold Pulse Width: 0.4 ms
Lead Channel Pacing Threshold Pulse Width: 0.4 ms
Lead Channel Sensing Intrinsic Amplitude: 2.875 mV
Lead Channel Sensing Intrinsic Amplitude: 2.875 mV
Lead Channel Sensing Intrinsic Amplitude: 5.75 mV
Lead Channel Sensing Intrinsic Amplitude: 5.75 mV
Lead Channel Setting Pacing Amplitude: 1.5 V
Lead Channel Setting Pacing Amplitude: 2 V
Lead Channel Setting Pacing Pulse Width: 0.4 ms
Lead Channel Setting Sensing Sensitivity: 0.3 mV
Zone Setting Status: 755011
Zone Setting Status: 755011

## 2021-12-21 NOTE — Progress Notes (Signed)
Remote ICD transmission.   

## 2022-01-07 ENCOUNTER — Other Ambulatory Visit (HOSPITAL_COMMUNITY): Payer: Self-pay | Admitting: *Deleted

## 2022-01-07 MED ORDER — ENTRESTO 24-26 MG PO TABS: 1.0000 | ORAL_TABLET | Freq: Two times a day (BID) | ORAL | 3 refills | Status: DC

## 2022-03-04 ENCOUNTER — Ambulatory Visit: Payer: Medicare Other

## 2022-03-04 DIAGNOSIS — I255 Ischemic cardiomyopathy: Secondary | ICD-10-CM

## 2022-03-04 LAB — CUP PACEART REMOTE DEVICE CHECK
Battery Remaining Longevity: 114 mo
Battery Voltage: 3.01 V
Brady Statistic AP VP Percent: 0.02 %
Brady Statistic AP VS Percent: 7.83 %
Brady Statistic AS VP Percent: 0.05 %
Brady Statistic AS VS Percent: 92.1 %
Brady Statistic RA Percent Paced: 7.77 %
Brady Statistic RV Percent Paced: 0.07 %
Date Time Interrogation Session: 20240301043822
HighPow Impedance: 101 Ohm
Implantable Lead Connection Status: 753985
Implantable Lead Connection Status: 753985
Implantable Lead Implant Date: 20220526
Implantable Lead Implant Date: 20220526
Implantable Lead Location: 753859
Implantable Lead Location: 753860
Implantable Lead Model: 5076
Implantable Pulse Generator Implant Date: 20220526
Lead Channel Impedance Value: 418 Ohm
Lead Channel Impedance Value: 456 Ohm
Lead Channel Impedance Value: 532 Ohm
Lead Channel Pacing Threshold Amplitude: 0.5 V
Lead Channel Pacing Threshold Amplitude: 0.625 V
Lead Channel Pacing Threshold Pulse Width: 0.4 ms
Lead Channel Pacing Threshold Pulse Width: 0.4 ms
Lead Channel Sensing Intrinsic Amplitude: 3 mV
Lead Channel Sensing Intrinsic Amplitude: 3 mV
Lead Channel Sensing Intrinsic Amplitude: 6.125 mV
Lead Channel Sensing Intrinsic Amplitude: 6.125 mV
Lead Channel Setting Pacing Amplitude: 1.5 V
Lead Channel Setting Pacing Amplitude: 2 V
Lead Channel Setting Pacing Pulse Width: 0.4 ms
Lead Channel Setting Sensing Sensitivity: 0.3 mV
Zone Setting Status: 755011
Zone Setting Status: 755011

## 2022-03-28 NOTE — Progress Notes (Signed)
Remote ICD transmission.   

## 2022-04-08 ENCOUNTER — Telehealth (HOSPITAL_COMMUNITY): Payer: Self-pay | Admitting: Cardiology

## 2022-04-08 NOTE — Telephone Encounter (Signed)
[facility] referral sent 04/07/22

## 2022-04-12 NOTE — Telephone Encounter (Signed)
[facility] auth received [id number] Auth valid 04/11/22-10/08/22

## 2022-04-13 ENCOUNTER — Other Ambulatory Visit (HOSPITAL_COMMUNITY): Payer: Self-pay

## 2022-04-13 MED ORDER — EMPAGLIFLOZIN 25 MG PO TABS: 25.0000 mg | ORAL_TABLET | Freq: Every day | ORAL | 1 refills | Status: DC

## 2022-04-13 MED ORDER — ENTRESTO 24-26 MG PO TABS: 1.0000 | ORAL_TABLET | Freq: Two times a day (BID) | ORAL | 3 refills | Status: DC

## 2022-04-13 MED ORDER — CARVEDILOL 6.25 MG PO TABS: 3.1250 mg | ORAL_TABLET | Freq: Two times a day (BID) | ORAL | 1 refills | Status: DC

## 2022-04-14 MED ORDER — EMPAGLIFLOZIN 25 MG PO TABS: ORAL_TABLET | ORAL | 11 refills | Status: DC

## 2022-04-14 NOTE — Addendum Note (Signed)
Addended by: Theresia Bough on: 04/14/2022 11:27 AM   Modules accepted: Orders

## 2022-05-06 ENCOUNTER — Inpatient Hospital Stay (HOSPITAL_COMMUNITY)
Admission: RE | Admit: 2022-05-06 | Discharge: 2022-05-06 | Disposition: A | Discharge status: Home / Self Care | Payer: Medicare Other | Source: Ambulatory Visit | Attending: Cardiology | Admitting: Cardiology

## 2022-05-06 ENCOUNTER — Ambulatory Visit (HOSPITAL_COMMUNITY)
Admission: RE | Admit: 2022-05-06 | Discharge: 2022-05-06 | Disposition: A | Discharge status: Home / Self Care | Payer: Medicare PPO | Source: Ambulatory Visit | Attending: Cardiology | Admitting: Cardiology

## 2022-05-06 ENCOUNTER — Other Ambulatory Visit (HOSPITAL_COMMUNITY): Payer: Self-pay | Admitting: Cardiology

## 2022-05-06 VITALS — BP 108/58 | HR 87 | Wt 297.1 lb

## 2022-05-06 DIAGNOSIS — I2510 Atherosclerotic heart disease of native coronary artery without angina pectoris: Secondary | ICD-10-CM | POA: Insufficient documentation

## 2022-05-06 DIAGNOSIS — Z794 Long term (current) use of insulin: Secondary | ICD-10-CM | POA: Insufficient documentation

## 2022-05-06 DIAGNOSIS — Z7985 Long-term (current) use of injectable non-insulin antidiabetic drugs: Secondary | ICD-10-CM

## 2022-05-06 DIAGNOSIS — G4733 Obstructive sleep apnea (adult) (pediatric): Secondary | ICD-10-CM | POA: Diagnosis not present

## 2022-05-06 DIAGNOSIS — E785 Hyperlipidemia, unspecified: Secondary | ICD-10-CM | POA: Diagnosis not present

## 2022-05-06 DIAGNOSIS — I5042 Chronic combined systolic (congestive) and diastolic (congestive) heart failure: Secondary | ICD-10-CM

## 2022-05-06 DIAGNOSIS — I493 Ventricular premature depolarization: Secondary | ICD-10-CM

## 2022-05-06 DIAGNOSIS — I255 Ischemic cardiomyopathy: Secondary | ICD-10-CM | POA: Diagnosis not present

## 2022-05-06 DIAGNOSIS — Z6841 Body Mass Index (BMI) 40.0 and over, adult: Secondary | ICD-10-CM | POA: Insufficient documentation

## 2022-05-06 DIAGNOSIS — E119 Type 2 diabetes mellitus without complications: Secondary | ICD-10-CM | POA: Diagnosis not present

## 2022-05-06 DIAGNOSIS — I110 Hypertensive heart disease with heart failure: Secondary | ICD-10-CM | POA: Insufficient documentation

## 2022-05-06 DIAGNOSIS — Z7902 Long term (current) use of antithrombotics/antiplatelets: Secondary | ICD-10-CM | POA: Diagnosis not present

## 2022-05-06 DIAGNOSIS — I5032 Chronic diastolic (congestive) heart failure: Secondary | ICD-10-CM | POA: Diagnosis not present

## 2022-05-06 LAB — BASIC METABOLIC PANEL
Anion gap: 11 (ref 5–15)
BUN: 13 mg/dL (ref 8–23)
CO2: 22 mmol/L (ref 22–32)
Calcium: 8.6 mg/dL — ABNORMAL LOW (ref 8.9–10.3)
Chloride: 105 mmol/L (ref 98–111)
Creatinine, Ser: 0.85 mg/dL (ref 0.61–1.24)
GFR, Estimated: >60 mL/min (ref >60)
Glucose, Bld: 171 mg/dL — ABNORMAL HIGH (ref 70–99)
Potassium: 4 mmol/L (ref 3.5–5.1)
Sodium: 138 mmol/L (ref 135–145)

## 2022-05-06 LAB — HEMOGLOBIN A1C
Hgb A1c MFr Bld: 6.1 % — ABNORMAL HIGH (ref 4.8–5.6)
Mean Plasma Glucose: 128.37 mg/dL

## 2022-05-06 LAB — LIPID PANEL
Cholesterol: 137 mg/dL (ref 0–200)
HDL: 31 mg/dL — ABNORMAL LOW (ref >40)
LDL Cholesterol: 66 mg/dL (ref 0–99)
Total CHOL/HDL Ratio: 4.4 RATIO
Triglycerides: 200 mg/dL — ABNORMAL HIGH (ref <150)
VLDL: 40 mg/dL (ref 0–40)

## 2022-05-06 LAB — BRAIN NATRIURETIC PEPTIDE: B Natriuretic Peptide: 25.4 pg/mL (ref 0.0–100.0)

## 2022-05-06 MED ORDER — FUROSEMIDE 20 MG PO TABS: 20.0000 mg | ORAL_TABLET | ORAL | 3 refills | Status: DC

## 2022-05-06 NOTE — Patient Instructions (Addendum)
CHANGE Lasix to every other day.  Labs done today, your results will be available in MyChart, we will contact you for abnormal readings.  Repeat blood work in 10 days at [facility]  Your physician has requested that you have an echocardiogram. Echocardiography is a painless test that uses sound waves to create images of your heart. It provides your doctor with information about the size and shape of your heart and how well your heart's chambers and valves are working. This procedure takes approximately one hour. There are no restrictions for this procedure. Please do NOT wear cologne, perfume, aftershave, or lotions (deodorant is allowed). Please arrive 15 minutes prior to your appointment time. YOU WILL BE CALLED TO HAVE YOUR ECHO SCHEDULED.  Your provider has recommended that  you wear a Zio Patch for 7 days.  This monitor will record your heart rhythm for our review.  IF you have any symptoms while wearing the monitor please press the button.  If you have any issues with the patch or you notice a red or orange light on it please call the company at [phone].  Once you remove the patch please mail it back to the company as soon as possible so we can get the results.  Your physician recommends that you schedule a follow-up appointment in: 4 months  If you have any questions or concerns before your next appointment please send [place] a message through [place] or call our office at [phone].    TO LEAVE A MESSAGE FOR THE NURSE SELECT OPTION 2, PLEASE LEAVE A MESSAGE INCLUDING: YOUR NAME DATE OF BIRTH CALL BACK NUMBER REASON FOR CALL**this is important as we prioritize the call backs  YOU WILL RECEIVE A CALL BACK THE SAME DAY AS LONG AS YOU CALL BEFORE 4:00 PM  At the [facility], you and your health needs are our priority. As part of our continuing mission to provide you with exceptional heart care, we have created designated Provider Care Teams. These Care Teams  include your primary Cardiologist (physician) and Advanced Practice Providers (APPs- Physician Assistants and Nurse Practitioners) who all work together to provide you with the care you need, when you need it.   You may see any of the following providers on your designated Care Team at your next follow up: Dr Arvilla Meres Dr Marca Ancona Dr. Marcos Eke, NP Robbie Lis, [place] [facility] [place], [place] Brynda Peon, NP Karle Plumber, PharmD   Please be sure to bring in all your medications bottles to every appointment.    Thank you for choosing  [facility]

## 2022-05-07 NOTE — Progress Notes (Signed)
[facility] Note   PCP: [facility] EP: Dr. Lalla Brothers HF Cardiologist: Dr. Shirlee Latch  HPI: 67 y.o. male w/ HTN, DM2, HLD, VF arrest, and ischemic cardiomyopathy.    He suffered witnessed out of hospital cardiac arrest 05/19/20 w/ immediate bystander CPR, initiated by wife, and prompt activation of EMS. Per report, had prolonged CPR, more than 45 min prior to ROSC. Underlying rhythm was VFib. Bedside echo showed EF 40-50% inferobasal severe hypokinesis with septal bounce.  RV systolic function normal. 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 No other disease. LM, LAD and LCx all widely patent. No indication for PCI.  Suspect scar-mediated VF.  He required mechanical ventilation and pressor support, but did not require cardiac mechanical support. 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 He received a Medtronic ICD for secondary prevention. He was discharged to CIR.   Echo in 2/23 showed EF 50-55% with inferior hypokinesis, normal RV, and normal IVC.   Today he returns for HF follow up. Weight is down 10 lbs on semaglutide.  However, he still has peripheral edema.  He has knee and back pain.  No chest pain.  He has not been using Lasix. He is using CPAP.  No orthopnea/PND.  Occasional palpitations.  Mild dyspnea pushing the lawn mower or walking up a hill or stairs.   Medtronic device interrogation: no VT/AF, decreased thoracic impedance with fluid index near threshold.   ECG (personally reviewed): NSR, 1st degree AVB 224 msec, RBBB  Labs (8/22): K 4.5, creatinine 1.03 Labs (11/22): K 4.3, creatinine 1.04 Labs (2/23): TGs 357, LDL 54, K 4.1, creatinine 4.09 Labs (9/23): K 4.0, creatinine 1.03, LDL 71, HDL 31 Labs (10/23): K 4,  creatinine 1.04, BNP 12  PMH: 1. Type 2 diabetes 2. GERD 3. HTN 4. Hyperlipidemia.  5. 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  - Medtronic ICD.  6. CAD: LHC 5/22 showed chronic occlusion of RCA with L=>R collaterals.  7. Transient complete heart block in 5/22.  8. Ischemic cardiomyopathy: Echo post-arrest in 5/22 showed EF 45%, repeat echo in 5/22 showed EF up to 60-65%.  - Echo (2/23): EF 50-55% with inferior hypokinesis, normal RV, and normal IVC.  9. OSA: Uses CPAP.   ROS: All systems negative except as listed in HPI, PMH and Problem List.  SH:  Social History   Socioeconomic History   Marital status: Married    Spouse name: Not on file   Number of children: Not on file   Years of education: Not on file   Highest education level: Not on file  Occupational History   Not on file  Tobacco Use   Smoking status: Former    Packs/day: 1.50    Years: 42.00    Additional pack years: 0.00    Total pack years: 63.00    Types: Cigarettes    Quit date: 09/03/2013    Years since quitting: 8.6   Smokeless tobacco: Former    Types: Chew   Tobacco comments:    We discussed the need to remain smoke free and to call me for help if he has the desire to smoke again.  Vaping Use   Vaping Use: Never used  Substance and Sexual Activity   Alcohol use: No    Alcohol/week: 0.0 standard  drinks of alcohol    Comment: recovering alcoholic    Drug use: No   Sexual activity: Not on file  Other Topics Concern   Not on file  Social History Narrative   Recovering alcoholic      Minister    Social Determinants of Health   Financial Resource Strain: Low Risk  (05/29/2020)   Overall Financial Resource Strain (CARDIA)    Difficulty of Paying Living Expenses: Not very hard  Food Insecurity: No Food Insecurity (05/29/2020)   Hunger Vital Sign    Worried About Running Out of Food in the Last Year: Never true    Ran Out of Food in the Last Year: Never true   Transportation Needs: No Transportation Needs (05/29/2020)   PRAPARE - Administrator, Civil Service (Medical): No    Lack of Transportation (Non-Medical): No  Physical Activity: Not on file  Stress: Not on file  Social Connections: Not on file  Intimate Partner Violence: Not on file   FH:  Family History  Problem Relation Age of Onset   Cancer Mother        lung   Crohn's disease Father    Alcohol abuse Brother    Colon polyps Brother    Colon cancer Neg Hx    Current Outpatient Medications  Medication Sig Dispense Refill   atorvastatin (LIPITOR) 80 MG tablet Take 80 mg by mouth at bedtime.     carboxymethylcellulose (REFRESH PLUS) 0.5 % SOLN Place 1 drop into both eyes 3 (three) times daily as needed (dry eyes).     carvedilol (COREG) 6.25 MG tablet Take 0.5 tablets (3.125 mg total) by mouth 2 (two) times daily. 90 tablet 1   clopidogrel (PLAVIX) 75 MG tablet Take 1 tablet (75 mg total) by mouth daily. 90 tablet 3   cyclobenzaprine (FLEXERIL) 5 MG tablet Take 5 mg by mouth as needed for muscle spasms.     diclofenac sodium (VOLTAREN) 1 % GEL Apply 4 g topically 3 (three) times daily as needed (pain). 350 g 0   docusate sodium (COLACE) 100 MG capsule Take 1 capsule (100 mg total) by mouth 2 (two) times daily. 28 capsule 0   empagliflozin (JARDIANCE) 25 MG TABS tablet Take 1/2 tablet daily 15 tablet 11   famotidine (PEPCID) 40 MG tablet Take 40 mg by mouth daily as needed for heartburn or indigestion.     gabapentin (NEURONTIN) 400 MG capsule Take 400-800 mg by mouth 4 (four) times daily.     icosapent Ethyl (VASCEPA) 1 g capsule Take 2 capsules (2 g total) by mouth 2 (two) times daily. 120 capsule 11   insulin aspart (NOVOLOG) 100 UNIT/ML injection Inject 35-50 Units into the skin 3 (three) times daily before meals. 35 units in AM, 35 units at Noon, and 40 units at supper sliding scale     insulin glargine (LANTUS) 100 UNIT/ML injection Inject 80 Units into the skin at  bedtime.     loratadine (CLARITIN) 10 MG tablet Take 10 mg by mouth daily.     Multiple Vitamins-Minerals (MULTIVITAMIN WITH MINERALS) tablet Take 1 tablet by mouth daily.     nitroGLYCERIN (NITROSTAT) 0.4 MG SL tablet Place 1 tablet (0.4 mg total) under the tongue every 5 (five) minutes x 3 doses as needed for chest pain. Call MD if you need two or more doses 30 tablet 12   oxyCODONE-acetaminophen (PERCOCET/ROXICET) 5-325 MG tablet Take 1 tablet by mouth every 6 (six) hours as needed for  severe pain. 10 tablet 0   pantoprazole (PROTONIX) 40 MG tablet Take 40 mg by mouth 2 (two) times daily before a meal.     Polyethyl Glycol-Propyl Glycol (SYSTANE) 0.4-0.3 % GEL ophthalmic gel Place 1 application. into both eyes every 6 (six) hours as needed (dry eyes).     sacubitril-valsartan (ENTRESTO) 24-26 MG Take 1 tablet by mouth 2 (two) times daily. 60 tablet 3   Semaglutide (OZEMPIC, 0.25 OR 0.5 MG/DOSE, [place]) Inject into the skin.     furosemide (LASIX) 20 MG tablet Take 1 tablet (20 mg total) by mouth every other day. 60 tablet 3   No current facility-administered medications for this encounter.   BP (!) 108/58   Pulse 87   Wt 134.8 kg (297 lb 2 oz)   SpO2 97%   BMI 40.30 kg/m   Wt Readings from Last 3 Encounters:  05/06/22 134.8 kg (297 lb 2 oz)  10/04/21 (!) 139.3 kg (307 lb)  09/21/21 (!) 140.2 kg (309 lb)   PHYSICAL EXAM: General: NAD Neck: JVP 7-8 cm, no thyromegaly or thyroid nodule.  Lungs: Clear to auscultation bilaterally with normal respiratory effort. CV: Nondisplaced PMI.  Heart regular S1/S2, no S3/S4, no murmur.  1+ edema L>R lower leg.  No carotid bruit.  Normal pedal pulses.  Abdomen: Soft, nontender, no hepatosplenomegaly, no distention.  Skin: Intact without lesions or rashes.  Neurologic: Alert and oriented x 3.  Psych: Normal affect. Extremities: No clubbing or cyanosis.  HEENT: Normal.   ASSESSMENT & PLAN:  1. Chronic diastolic CHF: Ischemic cardiomyopathy, EF in  the 45% range on initial echo in 5/22 but back up to 60-65% on repeat limited echo also in 5/22.  Echo in 2/23 showed EF 50-55% with inferior hypokinesis.  NYHA class II-III, he is mildly volume overloaded by both exam and Optivol.  - Continue Coreg 3.125 mg bid.  - Continue Entresto 24/26 mg bid. BMET and BNP today. - Continue Jardiance 10 day.  - Start Lasix 20 mg every other day with BMET in 10 days.  - I will arrange for repeat echo.  2. CAD: LHC in 5/22 showed CTO of the mid RCA with good collaterals.  Do not think this was acute MI from plaque rupture.   - Continue Plavix long-term, so no ASA.  - Continue statin/Vascepa, check lipids.  3. Cardiac arrest: In 5/22. Suspect scar-mediated from old RCA occlusion. EF now low normal to mildly reduced.  Medtronic ICD for secondary prevention.     4. Diabetes: He is on insulin. Per PCP.  5. 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  No further HB. 6. HTN: BP controlled. 7. Obesity: Body mass index is 40.3 kg/m. - Continue semaglutide.  8. PVCs: Patient noted to have PVCs and palpitations.  - I will place Zio monitor x 7 days.  9. OSA: Continue CPAP.   Follow up in 4 months with APP.   [name]  05/07/2022

## 2022-06-01 ENCOUNTER — Telehealth (HOSPITAL_COMMUNITY): Payer: Self-pay

## 2022-06-01 ENCOUNTER — Other Ambulatory Visit (HOSPITAL_COMMUNITY): Payer: Self-pay

## 2022-06-01 MED ORDER — MEXILETINE HCL 150 MG PO CAPS: 150.0000 mg | ORAL_CAPSULE | Freq: Two times a day (BID) | ORAL | 1 refills | Status: DC

## 2022-06-01 NOTE — Telephone Encounter (Signed)
Spoke to patient and updated on medication changes. He verbalized understanding. RX sent to [place] in [place].

## 2022-06-01 NOTE — Telephone Encounter (Signed)
-----   Message from Laurey Morale, MD sent at 05/30/2022  8:49 PM EDT ----- Very frequent PVCs.  Would increase Coreg to 6.25 mg bid and start mexiletine 150 mg bid.

## 2022-06-03 ENCOUNTER — Ambulatory Visit (INDEPENDENT_AMBULATORY_CARE_PROVIDER_SITE_OTHER): Payer: No Typology Code available for payment source

## 2022-06-03 DIAGNOSIS — I255 Ischemic cardiomyopathy: Secondary | ICD-10-CM

## 2022-06-03 LAB — CUP PACEART REMOTE DEVICE CHECK
Battery Remaining Longevity: 111 mo
Battery Voltage: 3.01 V
Brady Statistic AP VP Percent: 0.03 %
Brady Statistic AP VS Percent: 10.62 %
Brady Statistic AS VP Percent: 0.05 %
Brady Statistic AS VS Percent: 89.29 %
Brady Statistic RA Percent Paced: 10.52 %
Brady Statistic RV Percent Paced: 0.08 %
Date Time Interrogation Session: 20240531022823
HighPow Impedance: 99 Ohm
Implantable Lead Connection Status: 753985
Implantable Lead Connection Status: 753985
Implantable Lead Implant Date: 20220526
Implantable Lead Implant Date: 20220526
Implantable Lead Location: 753859
Implantable Lead Location: 753860
Implantable Lead Model: 5076
Implantable Pulse Generator Implant Date: 20220526
Lead Channel Impedance Value: 456 Ohm
Lead Channel Impedance Value: 456 Ohm
Lead Channel Impedance Value: 532 Ohm
Lead Channel Pacing Threshold Amplitude: 0.375 V
Lead Channel Pacing Threshold Amplitude: 0.625 V
Lead Channel Pacing Threshold Pulse Width: 0.4 ms
Lead Channel Pacing Threshold Pulse Width: 0.4 ms
Lead Channel Sensing Intrinsic Amplitude: 4.25 mV
Lead Channel Sensing Intrinsic Amplitude: 4.25 mV
Lead Channel Sensing Intrinsic Amplitude: 5.5 mV
Lead Channel Sensing Intrinsic Amplitude: 5.5 mV
Lead Channel Setting Pacing Amplitude: 1.5 V
Lead Channel Setting Pacing Amplitude: 2 V
Lead Channel Setting Pacing Pulse Width: 0.4 ms
Lead Channel Setting Sensing Sensitivity: 0.3 mV
Zone Setting Status: 755011
Zone Setting Status: 755011

## 2022-06-08 MED ORDER — CARVEDILOL 6.25 MG PO TABS: 6.2500 mg | ORAL_TABLET | Freq: Two times a day (BID) | ORAL | 1 refills | Status: DC

## 2022-06-08 MED ORDER — MEXILETINE HCL 150 MG PO CAPS: 150.0000 mg | ORAL_CAPSULE | Freq: Two times a day (BID) | ORAL | 1 refills | Status: DC

## 2022-06-08 NOTE — Telephone Encounter (Signed)
Returned call to patient -scripts for coreg and mexiltine sent to pharmacy [facility]) -echo appt pending in [place] (schedulers will contact) -confirmed orders for repeat labs at [facility]

## 2022-06-08 NOTE — Addendum Note (Signed)
Addended by: Toren Tucholski, Milagros Reap on: 06/08/2022 04:43 PM   Modules accepted: Orders

## 2022-06-10 ENCOUNTER — Other Ambulatory Visit (HOSPITAL_COMMUNITY): Payer: Self-pay | Admitting: Surgery

## 2022-06-10 ENCOUNTER — Ambulatory Visit (HOSPITAL_COMMUNITY)
Admission: RE | Admit: 2022-06-10 | Discharge: 2022-06-10 | Disposition: A | Discharge status: Home / Self Care | Payer: No Typology Code available for payment source | Source: Ambulatory Visit | Attending: Cardiology | Admitting: Cardiology

## 2022-06-10 ENCOUNTER — Telehealth (HOSPITAL_COMMUNITY): Payer: Self-pay

## 2022-06-10 DIAGNOSIS — E119 Type 2 diabetes mellitus without complications: Secondary | ICD-10-CM | POA: Diagnosis not present

## 2022-06-10 DIAGNOSIS — I5042 Chronic combined systolic (congestive) and diastolic (congestive) heart failure: Secondary | ICD-10-CM | POA: Insufficient documentation

## 2022-06-10 DIAGNOSIS — I110 Hypertensive heart disease with heart failure: Secondary | ICD-10-CM | POA: Diagnosis not present

## 2022-06-10 LAB — BASIC METABOLIC PANEL
Anion gap: 10 (ref 5–15)
BUN: 19 mg/dL (ref 8–23)
CO2: 27 mmol/L (ref 22–32)
Calcium: 9.3 mg/dL (ref 8.9–10.3)
Chloride: 101 mmol/L (ref 98–111)
Creatinine, Ser: 1.03 mg/dL (ref 0.61–1.24)
GFR, Estimated: >60 mL/min (ref >60)
Glucose, Bld: 82 mg/dL (ref 70–99)
Potassium: 4.6 mmol/L (ref 3.5–5.1)
Sodium: 138 mmol/L (ref 135–145)

## 2022-06-10 LAB — ECHOCARDIOGRAM COMPLETE
AR max vel: 3.43 cm2
AV Area VTI: 3.77 cm2
AV Area mean vel: 3.51 cm2
AV Mean grad: 1 mmHg
AV Peak grad: 2.3 mmHg
Ao pk vel: 0.76 m/s
Calc EF: 56.7 %
Single Plane A2C EF: 58.3 %
Single Plane A4C EF: 46.9 %

## 2022-06-10 MED ORDER — PERFLUTREN LIPID MICROSPHERE
1.0000 mL | INTRAVENOUS | Status: DC | PRN
  Administered 2022-06-10: 8 mL via INTRAVENOUS

## 2022-06-10 NOTE — Telephone Encounter (Addendum)
Pt aware, agreeable, and verbalized understanding.  ----- Message from Laurey Morale, MD sent at 06/10/2022  3:06 PM EDT ----- EF normal, no significant abnormalities.

## 2022-06-15 ENCOUNTER — Other Ambulatory Visit (HOSPITAL_COMMUNITY): Payer: Self-pay | Admitting: Cardiology

## 2022-06-15 MED ORDER — MEXILETINE HCL 150 MG PO CAPS: 150.0000 mg | ORAL_CAPSULE | Freq: Two times a day (BID) | ORAL | 0 refills | Status: DC

## 2022-06-15 NOTE — Telephone Encounter (Signed)
Pt called to report he is still unable to fill mexilitine thru [facility] pharmacy Community care referral valid Berkley Harvey #[id number] 04/11/22-10/08/22 Records requested thru [facility] [place] faxed on 05/12/22 Unsure why meds are not able to fill   [facility] contact [place] [phone]  Per [facility] need [facility] PA -documents to be faxed over for review

## 2022-06-21 NOTE — Progress Notes (Signed)
Remote ICD transmission.   

## 2022-06-23 ENCOUNTER — Other Ambulatory Visit (HOSPITAL_COMMUNITY): Payer: Self-pay | Admitting: Cardiology

## 2022-06-23 MED ORDER — MEXILETINE HCL 150 MG PO CAPS: 150.0000 mg | ORAL_CAPSULE | Freq: Two times a day (BID) | ORAL | 1 refills | Status: DC

## 2022-06-23 NOTE — Telephone Encounter (Signed)
Monthly supply sent to local pharmacy while we continue to iron out [facility] concerns ?formulary vs community care referral   [facility] [phone]

## 2022-06-24 ENCOUNTER — Other Ambulatory Visit (HOSPITAL_COMMUNITY): Payer: Self-pay | Admitting: Cardiology

## 2022-06-24 MED ORDER — CLOPIDOGREL BISULFATE 75 MG PO TABS: 75.0000 mg | ORAL_TABLET | Freq: Every day | ORAL | 3 refills | Status: DC

## 2022-06-24 NOTE — Telephone Encounter (Signed)
Pt called to report BLE edema Would like to take lasix daily for 4-5 days if ok with provider  Also requests refills for [facility] Plavix Script sent

## 2022-07-05 ENCOUNTER — Other Ambulatory Visit (HOSPITAL_COMMUNITY): Payer: Self-pay | Admitting: Cardiology

## 2022-07-05 MED ORDER — FUROSEMIDE 20 MG PO TABS: 20.0000 mg | ORAL_TABLET | ORAL | 1 refills | Status: DC

## 2022-07-05 MED ORDER — FUROSEMIDE 20 MG PO TABS: 20.0000 mg | ORAL_TABLET | ORAL | 3 refills | Status: DC

## 2022-07-05 NOTE — Telephone Encounter (Signed)
[facility] while waiting on scripts   Meds sent

## 2022-08-20 IMAGING — DX DG CHEST 1V PORT
2 series · 2 of 2 positions shown · non-contrast
Comparison: Film from earlier in the same day.

CLINICAL DATA: Check central line placement

EXAM:
PORTABLE CHEST 1 VIEW

[chest ap (1 of 2)]
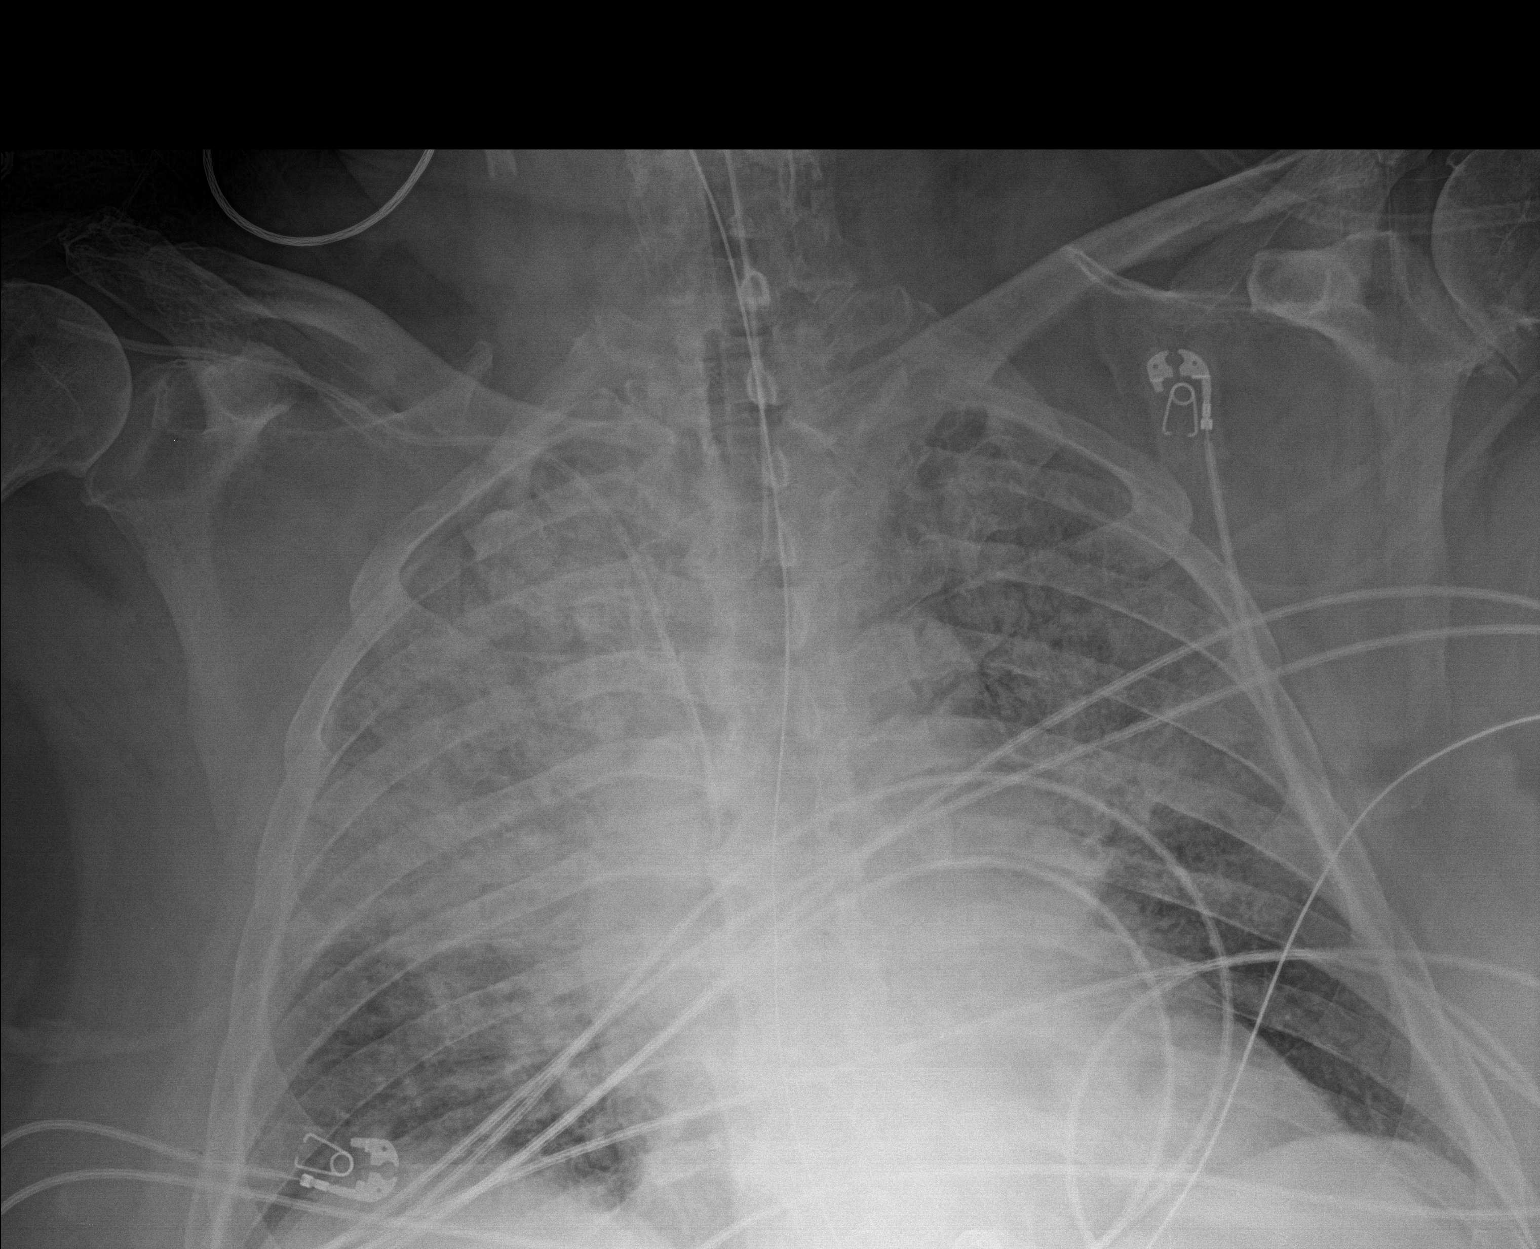

[chest ap (2 of 2)]
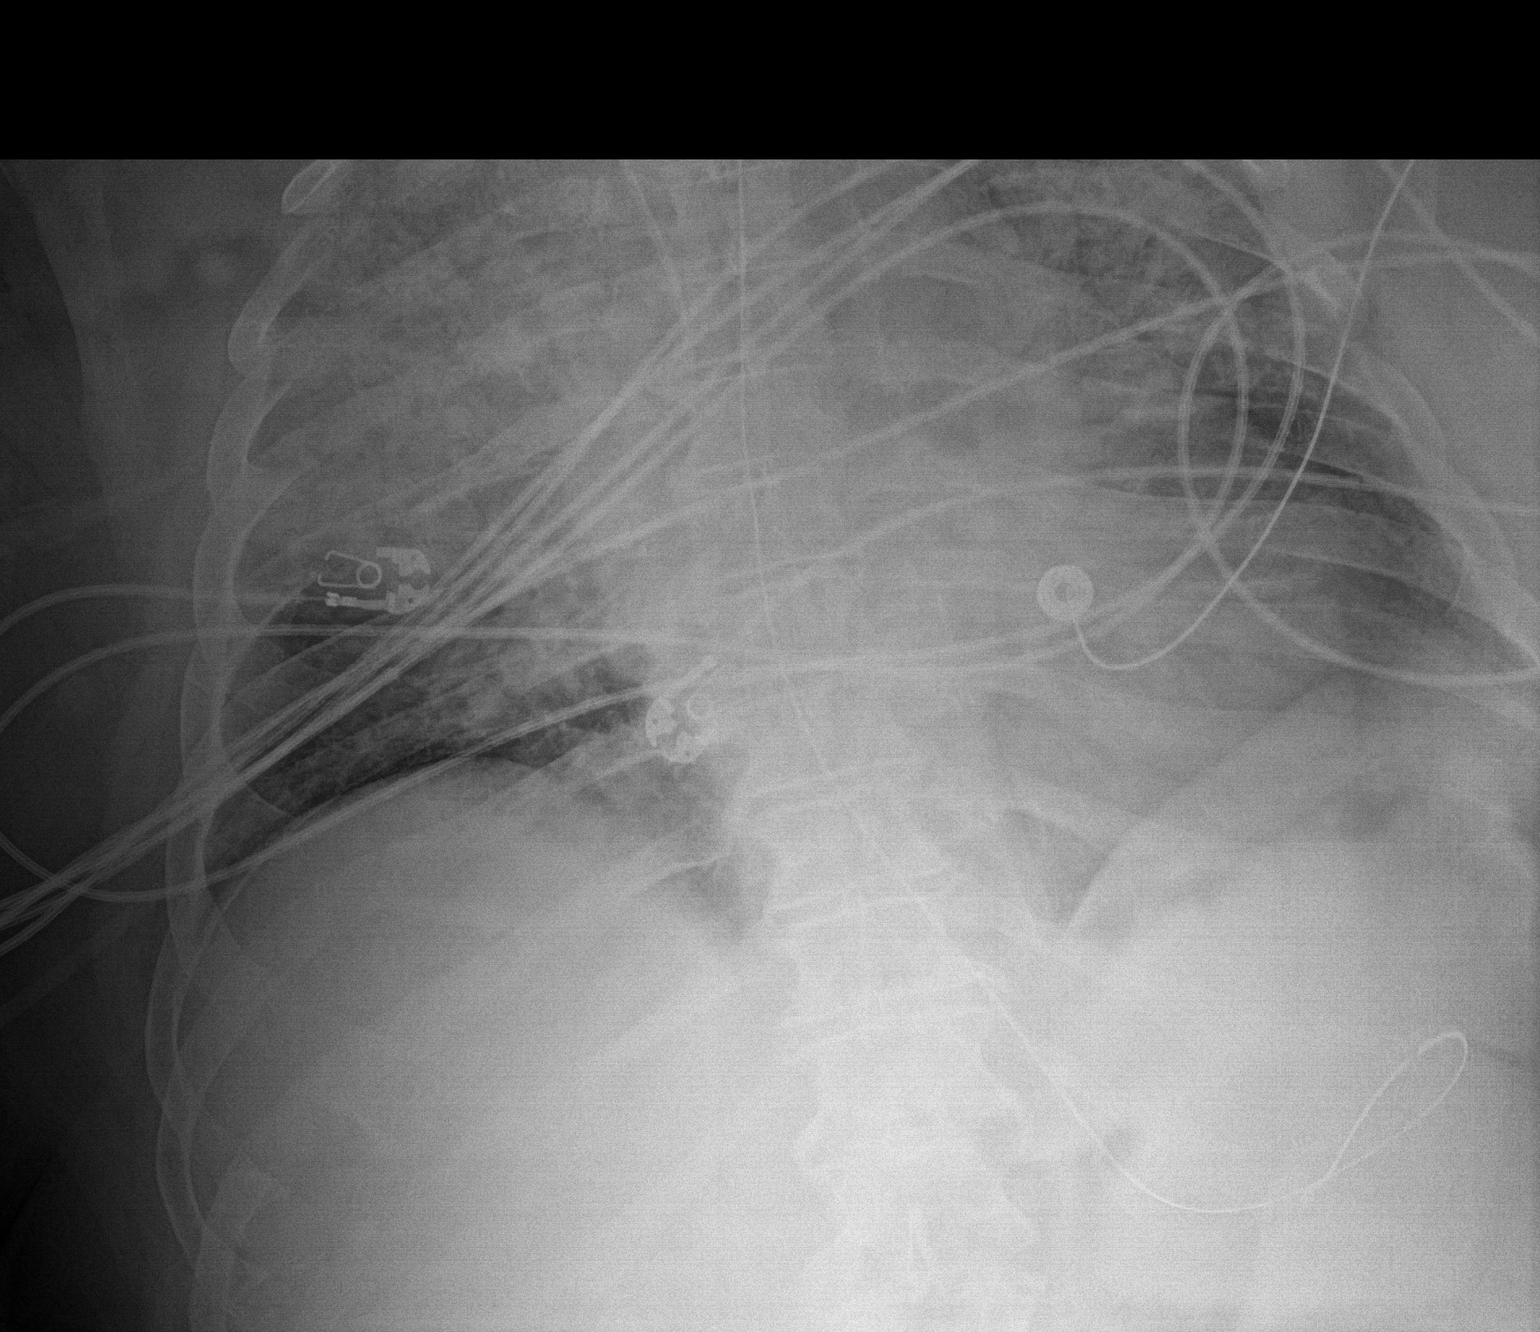

[2 of 2 positions shown; findings below may reference images not displayed]

FINDINGS: Endotracheal tube is noted in satisfactory position. Gastric
catheter is noted coiled within the stomach. Right subclavian
central line is noted with catheter tip in the mid superior vena
cava. No pneumothorax is noted. Persistent airspace opacities are
noted primarily in the upper lobes right worse than left. No other
focal abnormality is noted.
IMPRESSION: Stable airspace opacities.

Tubes and lines as described above without complicating factors.

## 2022-09-01 ENCOUNTER — Other Ambulatory Visit (HOSPITAL_COMMUNITY): Payer: Self-pay | Admitting: Cardiology

## 2022-09-01 MED ORDER — ENTRESTO 24-26 MG PO TABS: 1.0000 | ORAL_TABLET | Freq: Two times a day (BID) | ORAL | 3 refills | Status: DC

## 2022-09-01 MED ORDER — CARVEDILOL 6.25 MG PO TABS: 6.2500 mg | ORAL_TABLET | Freq: Two times a day (BID) | ORAL | 3 refills | Status: DC

## 2022-09-02 ENCOUNTER — Ambulatory Visit (INDEPENDENT_AMBULATORY_CARE_PROVIDER_SITE_OTHER): Payer: No Typology Code available for payment source

## 2022-09-02 DIAGNOSIS — I255 Ischemic cardiomyopathy: Secondary | ICD-10-CM

## 2022-09-02 DIAGNOSIS — I5032 Chronic diastolic (congestive) heart failure: Secondary | ICD-10-CM

## 2022-09-02 LAB — CUP PACEART REMOTE DEVICE CHECK
Battery Remaining Longevity: 108 mo
Battery Voltage: 3 V
Brady Statistic AP VP Percent: 0.02 %
Brady Statistic AP VS Percent: 5.88 %
Brady Statistic AS VP Percent: 0.08 %
Brady Statistic AS VS Percent: 94.02 %
Brady Statistic RA Percent Paced: 5.86 %
Brady Statistic RV Percent Paced: 0.09 %
Date Time Interrogation Session: 20240830031807
HighPow Impedance: 90 Ohm
Implantable Lead Connection Status: 753985
Implantable Lead Connection Status: 753985
Implantable Lead Implant Date: 20220526
Implantable Lead Implant Date: 20220526
Implantable Lead Location: 753859
Implantable Lead Location: 753860
Implantable Lead Model: 5076
Implantable Pulse Generator Implant Date: 20220526
Lead Channel Impedance Value: 418 Ohm
Lead Channel Impedance Value: 456 Ohm
Lead Channel Impedance Value: 513 Ohm
Lead Channel Pacing Threshold Amplitude: 0.5 V
Lead Channel Pacing Threshold Amplitude: 0.625 V
Lead Channel Pacing Threshold Pulse Width: 0.4 ms
Lead Channel Pacing Threshold Pulse Width: 0.4 ms
Lead Channel Sensing Intrinsic Amplitude: 3.125 mV
Lead Channel Sensing Intrinsic Amplitude: 3.125 mV
Lead Channel Sensing Intrinsic Amplitude: 5.75 mV
Lead Channel Sensing Intrinsic Amplitude: 5.75 mV
Lead Channel Setting Pacing Amplitude: 1.5 V
Lead Channel Setting Pacing Amplitude: 2 V
Lead Channel Setting Pacing Pulse Width: 0.4 ms
Lead Channel Setting Sensing Sensitivity: 0.3 mV
Zone Setting Status: 755011
Zone Setting Status: 755011

## 2022-09-06 NOTE — Progress Notes (Incomplete)
Advanced Heart Failure Clinic Note   PCP: [facility] EP: Dr. Lalla Brothers HF Cardiologist: Dr. Shirlee Latch  HPI: 67 y.o. male w/ HTN, DM2, HLD, VF arrest, and ischemic cardiomyopathy.    He suffered witnessed out of hospital cardiac arrest 05/19/20 w/ immediate bystander CPR, initiated by wife, and prompt activation of EMS. Per report, had prolonged CPR, more than 45 min prior to ROSC. Underlying rhythm was VFib. Bedside echo showed EF 40-50% inferobasal severe hypokinesis with septal bounce.  RV systolic function normal. 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 No other disease. LM, LAD and LCx all widely patent. No indication for PCI.  Suspect scar-mediated VF.  He required mechanical ventilation and pressor support, but did not require cardiac mechanical support. 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 He received a Medtronic ICD for secondary prevention. He was discharged to CIR.   Echo in 2/23 showed EF 50-55% with inferior hypokinesis, normal RV, and normal IVC.   Zio 7 day (5/24) showed mostly NSR, 19% PVC burden. Coreg increased and mexiletine 150 mg bid added.  Echo 6/24 showed EF 55-60%, G1DD, normal RV.  Today he returns for HF follow up. Overall feeling fine. Feels HR is sometimes "erratic" but feels less palpitations on mexelitine. Feels better on higher dose of Coreg. No SOB walking on flat ground or with ADLs.  Physically limited by R knee OA, receives injections at the [place]. Denies CP, dizziness, edema, or PND/Orthopnea. Chronically sleeps in a recliner, wears CPAP. Appetite ok. No fever or chills. Weight at home 289 pounds. Taking all medications. He has several wounds on bottom, wife has been putting barrier cream on them and says they  are healing.  Medtronic device interrogation: no VT/AF, decreased thoracic impedance with fluid index above threshold, 1.5 hr/day activity  ECG (personally reviewed): SR with 1AVB, PR 234 msec 81 bpm  Labs (8/22): K 4.5, creatinine 1.03 Labs (11/22): K 4.3, creatinine 1.04 Labs (2/23): TGs 357, LDL 54, K 4.1, creatinine 4.09 Labs (9/23): K 4.0, creatinine 1.03, LDL 71, HDL 31 Labs (10/23): K 4, creatinine 1.04, BNP 12 Labs (5/24): LDL 66, TGs 200 Labs (6/24): K 4.6, creatinine 1.03  PMH: 1. Type 2 diabetes 2. GERD 3. HTN 4. Hyperlipidemia.  5. 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  - Medtronic ICD.  6. CAD: LHC 5/22 showed chronic occlusion of RCA with L=>R collaterals.  7. Transient complete heart block in 5/22.  8. Ischemic cardiomyopathy: Echo post-arrest in 5/22 showed EF 45%, repeat echo in 5/22 showed EF up to 60-65%.  - Echo (2/23): EF 50-55% with inferior hypokinesis, normal RV, and normal IVC.  - Echo (6/24): EF 50-55%, G1DD, normal RV, normal IVC. 9. OSA: Uses CPAP.  10. PVC: 7-day Zio (5/24) with 19% PVC burden. Started mexelitine.  ROS: All systems negative except as listed in HPI, PMH and Problem List.  SH:  Social History   Socioeconomic History   Marital status: Married    Spouse name: Not on file   Number of children: Not on file   Years of education: Not on file   Highest education level: Not on file  Occupational History   Not on file  Tobacco Use   Smoking status: Former    Current packs/day: 0.00    Average packs/day:  1.5 packs/day for 42.0 years (63.0 ttl pk-yrs)    Types: Cigarettes    Start date: 09/04/1971    Quit date: 09/03/2013    Years since quitting: 9.0   Smokeless tobacco: Former    Types: Chew   Tobacco comments:    We discussed the need to remain smoke free and to call me for help if he has the desire to smoke again.  Vaping Use   Vaping status: Never Used  Substance and Sexual Activity   Alcohol use:  No    Alcohol/week: 0.0 standard drinks of alcohol    Comment: recovering alcoholic    Drug use: No   Sexual activity: Not on file  Other Topics Concern   Not on file  Social History Narrative   Recovering alcoholic      Minister    Social Determinants of Health   Financial Resource Strain: Low Risk  (05/29/2020)   Overall Financial Resource Strain (CARDIA)    Difficulty of Paying Living Expenses: Not very hard  Food Insecurity: No Food Insecurity (05/29/2020)   Hunger Vital Sign    Worried About Running Out of Food in the Last Year: Never true    Ran Out of Food in the Last Year: Never true  Transportation Needs: No Transportation Needs (05/29/2020)   PRAPARE - Administrator, Civil Service (Medical): No    Lack of Transportation (Non-Medical): No  Physical Activity: Not on file  Stress: Not on file  Social Connections: Not on file  Intimate Partner Violence: Not on file   FH:  Family History  Problem Relation Age of Onset   Cancer Mother        lung   Crohn's disease Father    Alcohol abuse Brother    Colon polyps Brother    Colon cancer Neg Hx    Current Outpatient Medications  Medication Sig Dispense Refill   atorvastatin (LIPITOR) 80 MG tablet Take 80 mg by mouth at bedtime.     carboxymethylcellulose (REFRESH PLUS) 0.5 % SOLN Place 1 drop into both eyes 3 (three) times daily as needed (dry eyes).     carvedilol (COREG) 6.25 MG tablet Take 1 tablet (6.25 mg total) by mouth 2 (two) times daily. 60 tablet 3   clopidogrel (PLAVIX) 75 MG tablet Take 1 tablet (75 mg total) by mouth daily. 90 tablet 3   cyclobenzaprine (FLEXERIL) 5 MG tablet Take 5 mg by mouth as needed for muscle spasms.     diclofenac sodium (VOLTAREN) 1 % GEL Apply 4 g topically 3 (three) times daily as needed (pain). 350 g 0   docusate sodium (COLACE) 100 MG capsule Take 1 capsule (100 mg total) by mouth 2 (two) times daily. 28 capsule 0   empagliflozin (JARDIANCE) 25 MG TABS tablet Take  1/2 tablet daily 15 tablet 11   famotidine (PEPCID) 40 MG tablet Take 40 mg by mouth daily as needed for heartburn or indigestion.     furosemide (LASIX) 20 MG tablet Take 1 tablet (20 mg total) by mouth every other day. 45 tablet 3   gabapentin (NEURONTIN) 400 MG capsule Take 400-800 mg by mouth 4 (four) times daily.     icosapent Ethyl (VASCEPA) 1 g capsule Take 2 capsules (2 g total) by mouth 2 (two) times daily. 120 capsule 11   insulin aspart (NOVOLOG) 100 UNIT/ML injection Inject 35-50 Units into the skin 3 (three) times daily before meals. 35 units in AM, 35 units at [place], and  40 units at supper sliding scale     insulin glargine (LANTUS) 100 UNIT/ML injection Inject 80 Units into the skin at bedtime.     loratadine (CLARITIN) 10 MG tablet Take 10 mg by mouth daily.     mexiletine (MEXITIL) 150 MG capsule Take 1 capsule (150 mg total) by mouth 2 (two) times daily. 60 capsule 1   Multiple Vitamins-Minerals (MULTIVITAMIN WITH MINERALS) tablet Take 1 tablet by mouth daily.     nitroGLYCERIN (NITROSTAT) 0.4 MG SL tablet Place 1 tablet (0.4 mg total) under the tongue every 5 (five) minutes x 3 doses as needed for chest pain. Call MD if you need two or more doses 30 tablet 12   oxyCODONE-acetaminophen (PERCOCET/ROXICET) 5-325 MG tablet Take 1 tablet by mouth every 6 (six) hours as needed for severe pain. 10 tablet 0   pantoprazole (PROTONIX) 40 MG tablet Take 40 mg by mouth 2 (two) times daily before a meal.     Polyethyl Glycol-Propyl Glycol (SYSTANE) 0.4-0.3 % GEL ophthalmic gel Place 1 application. into both eyes every 6 (six) hours as needed (dry eyes).     sacubitril-valsartan (ENTRESTO) 24-26 MG Take 1 tablet by mouth 2 (two) times daily. 180 tablet 3   Semaglutide (OZEMPIC, 0.25 OR 0.5 MG/DOSE, [place]) Inject into the skin.     No current facility-administered medications for this encounter.   BP 122/78   Pulse 92   Wt 135.3 kg (298 lb 3.2 oz)   SpO2 97%   BMI 40.44 kg/m   Wt Readings  from Last 3 Encounters:  09/08/22 135.3 kg (298 lb 3.2 oz)  05/06/22 134.8 kg (297 lb 2 oz)  10/04/21 (!) 139.3 kg (307 lb)   PHYSICAL EXAM: General:  NAD. No resp difficulty, walked into clinic HEENT: Normal Neck: Supple. JVP 8-10, thick neck. Carotids 2+ bilat; no bruits. No lymphadenopathy or thryomegaly appreciated. Cor: PMI nondisplaced. Regular rate & rhythm. No rubs, gallops or murmurs. Lungs: Clear Abdomen: Soft, obese, nontender, nondistended. No hepatosplenomegaly. No bruits or masses. Good bowel sounds. Extremities: No cyanosis, clubbing, rash, edema Neuro: Alert & oriented x 3, cranial nerves grossly intact. Moves all 4 extremities w/o difficulty. Affect pleasant. GU: ~2 cm circumscribed ulcer to R buttock, no erythema or drainage.  ASSESSMENT & PLAN:  1. Chronic diastolic CHF: Ischemic cardiomyopathy, EF in the 45% range on initial echo in 5/22 but back up to 60-65% on repeat limited echo also in 5/22.  Echo in 2/23 showed EF 50-55% with inferior hypokinesis.  Echo 6/24 showed EF 55-60%, G1DD, normal RV. NYHA class II-IIB, body habitus and knee OA contributory. He is mildly volume overloaded by both exam and Optivol.  - Increase Lasix to 20 mg daily. Add 10 KCL daily. BMET/BNP today, repeat in 10-14 days - Continue Coreg 6.25 mg bid.  - Continue Entresto 24/26 mg bid.  - Continue Jardiance (see below) - Will ask device RN to send transmission in 1 week to follow response to increased Lasix. 2. CAD: LHC in 5/22 showed CTO of the mid RCA with good collaterals.  Do not think this was acute MI from plaque rupture.   - Continue Plavix long-term, so no ASA.  - Continue statin/Vascepa, check lipids.  3. Cardiac arrest: In 5/22. Suspect scar-mediated from old RCA occlusion. EF now low normal to mildly reduced.  Medtronic ICD for secondary prevention.     4. Diabetes: He is on insulin. Per PCP.  5. 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  No further HB. 6. HTN: BP  controlled. - GDMT as above. 7. Obesity: Body mass index is 40.44 kg/m. - Continue semaglutide.  8. PVCs: Patient noted to have PVCs and palpitations.  - 7 day Zio monitor (6/24) showed 19% PVC burden. - Continue Coreg - Continue mexiletine 150 mg bid - Repeat 7-day Zio to ensure PVCs adequately suppressed. 9. OSA: Continue CPAP.  - Advised he speak to [facility] regarding Inspire device. 10. Sacral Wound: he has a stage 2 ulcer to R buttock, no erythema or drainage. No indication for abx - Continue off-loading and barrier cream. - I think he can continue SGLT2i for now, but would stop if ulcer worsens.  Follow up in 4 months with Dr. Kathreen Cornfield [facility] FNP-BC 09/08/2022

## 2022-09-06 NOTE — Progress Notes (Signed)
Remote ICD transmission.   

## 2022-09-07 ENCOUNTER — Other Ambulatory Visit (HOSPITAL_COMMUNITY): Payer: Self-pay | Admitting: Cardiology

## 2022-09-07 MED ORDER — CARVEDILOL 6.25 MG PO TABS: 6.2500 mg | ORAL_TABLET | Freq: Two times a day (BID) | ORAL | 3 refills | Status: DC

## 2022-09-07 NOTE — Telephone Encounter (Signed)
[facility]  Meds sent

## 2022-09-08 ENCOUNTER — Other Ambulatory Visit (HOSPITAL_COMMUNITY): Payer: Self-pay | Admitting: Cardiology

## 2022-09-08 ENCOUNTER — Encounter (HOSPITAL_COMMUNITY): Payer: Self-pay

## 2022-09-08 ENCOUNTER — Ambulatory Visit (HOSPITAL_COMMUNITY)
Admission: RE | Admit: 2022-09-08 | Discharge: 2022-09-08 | Disposition: A | Discharge status: Home / Self Care | Payer: No Typology Code available for payment source | Source: Ambulatory Visit | Attending: Family Medicine | Admitting: Family Medicine

## 2022-09-08 ENCOUNTER — Inpatient Hospital Stay (HOSPITAL_COMMUNITY)
Admission: RE | Admit: 2022-09-08 | Discharge: 2022-09-08 | Disposition: A | Discharge status: Home / Self Care | Payer: No Typology Code available for payment source | Source: Ambulatory Visit | Attending: Cardiology | Admitting: Cardiology

## 2022-09-08 VITALS — BP 122/78 | HR 92 | Wt 298.2 lb

## 2022-09-08 DIAGNOSIS — R002 Palpitations: Secondary | ICD-10-CM | POA: Diagnosis not present

## 2022-09-08 DIAGNOSIS — X58XXXS Exposure to other specified factors, sequela: Secondary | ICD-10-CM | POA: Insufficient documentation

## 2022-09-08 DIAGNOSIS — I255 Ischemic cardiomyopathy: Secondary | ICD-10-CM | POA: Insufficient documentation

## 2022-09-08 DIAGNOSIS — Z7984 Long term (current) use of oral hypoglycemic drugs: Secondary | ICD-10-CM | POA: Insufficient documentation

## 2022-09-08 DIAGNOSIS — Z79899 Other long term (current) drug therapy: Secondary | ICD-10-CM | POA: Diagnosis not present

## 2022-09-08 DIAGNOSIS — I442 Atrioventricular block, complete: Secondary | ICD-10-CM | POA: Insufficient documentation

## 2022-09-08 DIAGNOSIS — E785 Hyperlipidemia, unspecified: Secondary | ICD-10-CM | POA: Diagnosis not present

## 2022-09-08 DIAGNOSIS — I10 Essential (primary) hypertension: Secondary | ICD-10-CM

## 2022-09-08 DIAGNOSIS — I5032 Chronic diastolic (congestive) heart failure: Secondary | ICD-10-CM | POA: Insufficient documentation

## 2022-09-08 DIAGNOSIS — I493 Ventricular premature depolarization: Secondary | ICD-10-CM

## 2022-09-08 DIAGNOSIS — Z6841 Body Mass Index (BMI) 40.0 and over, adult: Secondary | ICD-10-CM | POA: Insufficient documentation

## 2022-09-08 DIAGNOSIS — S31000S Unspecified open wound of lower back and pelvis without penetration into retroperitoneum, sequela: Secondary | ICD-10-CM | POA: Diagnosis not present

## 2022-09-08 DIAGNOSIS — Z7902 Long term (current) use of antithrombotics/antiplatelets: Secondary | ICD-10-CM | POA: Diagnosis not present

## 2022-09-08 DIAGNOSIS — E119 Type 2 diabetes mellitus without complications: Secondary | ICD-10-CM | POA: Insufficient documentation

## 2022-09-08 DIAGNOSIS — M1711 Unilateral primary osteoarthritis, right knee: Secondary | ICD-10-CM | POA: Insufficient documentation

## 2022-09-08 DIAGNOSIS — Z794 Long term (current) use of insulin: Secondary | ICD-10-CM | POA: Diagnosis not present

## 2022-09-08 DIAGNOSIS — Z8674 Personal history of sudden cardiac arrest: Secondary | ICD-10-CM | POA: Insufficient documentation

## 2022-09-08 DIAGNOSIS — Z8679 Personal history of other diseases of the circulatory system: Secondary | ICD-10-CM

## 2022-09-08 DIAGNOSIS — G4733 Obstructive sleep apnea (adult) (pediatric): Secondary | ICD-10-CM | POA: Insufficient documentation

## 2022-09-08 DIAGNOSIS — I2510 Atherosclerotic heart disease of native coronary artery without angina pectoris: Secondary | ICD-10-CM | POA: Insufficient documentation

## 2022-09-08 DIAGNOSIS — I110 Hypertensive heart disease with heart failure: Secondary | ICD-10-CM | POA: Insufficient documentation

## 2022-09-08 LAB — BRAIN NATRIURETIC PEPTIDE: B Natriuretic Peptide: 11.2 pg/mL (ref 0.0–100.0)

## 2022-09-08 LAB — BASIC METABOLIC PANEL
Anion gap: 7 (ref 5–15)
BUN: 12 mg/dL (ref 8–23)
CO2: 28 mmol/L (ref 22–32)
Calcium: 8.9 mg/dL (ref 8.9–10.3)
Chloride: 103 mmol/L (ref 98–111)
Creatinine, Ser: 0.97 mg/dL (ref 0.61–1.24)
GFR, Estimated: >60 mL/min (ref >60)
Glucose, Bld: 70 mg/dL (ref 70–99)
Potassium: 4.2 mmol/L (ref 3.5–5.1)
Sodium: 138 mmol/L (ref 135–145)

## 2022-09-08 MED ORDER — POTASSIUM CHLORIDE CRYS ER 10 MEQ PO TBCR: 10.0000 meq | EXTENDED_RELEASE_TABLET | Freq: Every day | ORAL | 3 refills | Status: DC

## 2022-09-08 MED ORDER — FUROSEMIDE 20 MG PO TABS: 20.0000 mg | ORAL_TABLET | Freq: Every day | ORAL | 3 refills | Status: DC

## 2022-09-08 NOTE — Progress Notes (Signed)
Zio patch placed onto patient.  All instructions and information reviewed with patient, they verbalize understanding with no questions. 

## 2022-09-08 NOTE — Patient Instructions (Addendum)
EKG done today.  Labs done today. We will contact you only if your labs are abnormal.  INCREASE Lasix to 20mg  (1 tablet) by mouth daily.   START Potassium ( 1 tablet) by mouth daily.   No other medication changes were made. Please continue all current medications as prescribed.  Your provider has recommended that  you wear a Zio Patch for 7 days.  This monitor will record your heart rhythm for our review.  IF you have any symptoms while wearing the monitor please press the button.  If you have any issues with the patch or you notice a red or orange light on it please call the company at [phone].  Once you remove the patch please mail it back to the company as soon as possible so we can get the results.  Your physician recommends that you schedule a follow-up appointment in: 10-14 days for a lab only appointment and in 4 months with Dr. Shirlee Latch. Please contact our office in November to schedule a January 2025 appointment.   If you have any questions or concerns before your next appointment please send [place] a message through [place] or call our office at [phone].    TO LEAVE A MESSAGE FOR THE NURSE SELECT OPTION 2, PLEASE LEAVE A MESSAGE INCLUDING: YOUR NAME DATE OF BIRTH CALL BACK NUMBER REASON FOR CALL**this is important as we prioritize the call backs  YOU WILL RECEIVE A CALL BACK THE SAME DAY AS LONG AS YOU CALL BEFORE 4:00 PM   Do the following things EVERYDAY: Weigh yourself in the morning before breakfast. Write it down and keep it in a log. Take your medicines as prescribed Eat low salt foods--Limit salt (sodium) to 2000 mg per day.  Stay as active as you can everyday Limit all fluids for the day to less than 2 liters   At the Advanced Heart Failure Clinic, you and your health needs are our priority. As part of our continuing mission to provide you with exceptional heart care, we have created designated Provider Care Teams. These Care Teams include your primary  Cardiologist (physician) and Advanced Practice Providers (APPs- Physician Assistants and Nurse Practitioners) who all work together to provide you with the care you need, when you need it.   You may see any of the following providers on your designated Care Team at your next follow up: Dr Arvilla Meres Dr Marca Ancona Dr. Marcos Eke, NP Robbie Lis, [place] [facility] [place], [place] Brynda Peon, NP Karle Plumber, PharmD   Please be sure to bring in all your medications bottles to every appointment.    Thank you for choosing [facility]

## 2022-09-09 ENCOUNTER — Telehealth (HOSPITAL_COMMUNITY): Payer: Self-pay | Admitting: *Deleted

## 2022-09-09 NOTE — Telephone Encounter (Signed)
Received fax from [employer], [employer] requires Prior Auth and more info is needed.  09/08/22 OV note faxed to [employer] at [phone]

## 2022-09-13 ENCOUNTER — Ambulatory Visit: Payer: No Typology Code available for payment source | Attending: Cardiology

## 2022-09-13 ENCOUNTER — Telehealth: Payer: Self-pay

## 2022-09-13 DIAGNOSIS — I5032 Chronic diastolic (congestive) heart failure: Secondary | ICD-10-CM | POA: Diagnosis not present

## 2022-09-13 DIAGNOSIS — Z95810 Presence of automatic (implantable) cardiac defibrillator: Secondary | ICD-10-CM | POA: Diagnosis not present

## 2022-09-13 NOTE — Telephone Encounter (Signed)
Attempted call to patient regarding ICM remote transmission and left message requesting manual remote transmission be sent.  Left ICM call back number and may call back for assistance.

## 2022-09-13 NOTE — Progress Notes (Signed)
EPIC Encounter for ICM Monitoring  Patient Name: [name] is a 67 y.o. male Date: 09/13/2022 Primary Care Physican: [facility] Primary Cardiologist: Shirlee Latch Electrophysiologist: Lalla Brothers    ICM check for HF clinic.  Heart Failure questions reviewed.  Pt confirmed taking Lasix daily as prescribed.    Optivol thoracic impedance returned to normal baseline normal 9/9.  Prescribed:  Furosemide 20 mg take 1 tablet(s) (20 mg total) by mouth daily. Potassium 10 mEq take 1 tablet(s) (10 mEq total) by mouth daily.  Recommendations:  Encouraged to call HF clinic for changes in condition if needed.  Follow-up plan: No further ICM clinic phone appointment.   91 day device clinic remote transmission 12/02/2022.    EP/Cardiology Office Visits: Recall 09/16/2022 with Dr. Lalla Brothers.    Copy of ICM check sent to Dr. Lalla Brothers.   3 month ICM trend: 09/13/2022.    12-14 Month ICM trend:     Karie Soda, RN 09/13/2022 3:10 PM

## 2022-09-13 NOTE — Telephone Encounter (Signed)
Spoke with patient and assisted with sending manual remote transmission.  See ICM note for results.

## 2022-09-16 ENCOUNTER — Encounter: Payer: Self-pay | Admitting: Internal Medicine

## 2022-09-27 ENCOUNTER — Other Ambulatory Visit (HOSPITAL_COMMUNITY): Payer: Self-pay | Admitting: Cardiology

## 2022-09-27 DIAGNOSIS — I5032 Chronic diastolic (congestive) heart failure: Secondary | ICD-10-CM

## 2022-09-27 NOTE — Progress Notes (Signed)
Repeat lab orders placed

## 2022-10-13 ENCOUNTER — Telehealth (HOSPITAL_COMMUNITY): Payer: Self-pay | Admitting: Cardiology

## 2022-10-13 NOTE — Telephone Encounter (Signed)
[facility] called to request [facility]   [facility] referral sent to [phone] as requested

## 2022-12-02 ENCOUNTER — Ambulatory Visit (INDEPENDENT_AMBULATORY_CARE_PROVIDER_SITE_OTHER): Payer: Medicare PPO

## 2022-12-02 DIAGNOSIS — I255 Ischemic cardiomyopathy: Secondary | ICD-10-CM | POA: Diagnosis not present

## 2022-12-03 LAB — CUP PACEART REMOTE DEVICE CHECK
Battery Remaining Longevity: 106 mo
Battery Voltage: 3 V
Brady Statistic AP VP Percent: 0.01 %
Brady Statistic AP VS Percent: 7.14 %
Brady Statistic AS VP Percent: 0.05 %
Brady Statistic AS VS Percent: 92.8 %
Brady Statistic RA Percent Paced: 7.1 %
Brady Statistic RV Percent Paced: 0.06 %
Date Time Interrogation Session: 20241129033424
HighPow Impedance: 95 Ohm
Implantable Lead Connection Status: 753985
Implantable Lead Connection Status: 753985
Implantable Lead Implant Date: 20220526
Implantable Lead Implant Date: 20220526
Implantable Lead Location: 753859
Implantable Lead Location: 753860
Implantable Lead Model: 5076
Implantable Pulse Generator Implant Date: 20220526
Lead Channel Impedance Value: 418 Ohm
Lead Channel Impedance Value: 475 Ohm
Lead Channel Impedance Value: 513 Ohm
Lead Channel Pacing Threshold Amplitude: 0.5 V
Lead Channel Pacing Threshold Amplitude: 0.625 V
Lead Channel Pacing Threshold Pulse Width: 0.4 ms
Lead Channel Pacing Threshold Pulse Width: 0.4 ms
Lead Channel Sensing Intrinsic Amplitude: 2.25 mV
Lead Channel Sensing Intrinsic Amplitude: 2.25 mV
Lead Channel Sensing Intrinsic Amplitude: 6.5 mV
Lead Channel Sensing Intrinsic Amplitude: 6.5 mV
Lead Channel Setting Pacing Amplitude: 1.5 V
Lead Channel Setting Pacing Amplitude: 2 V
Lead Channel Setting Pacing Pulse Width: 0.4 ms
Lead Channel Setting Sensing Sensitivity: 0.3 mV
Zone Setting Status: 755011
Zone Setting Status: 755011

## 2022-12-20 ENCOUNTER — Other Ambulatory Visit (HOSPITAL_COMMUNITY): Payer: Self-pay | Admitting: Family Medicine

## 2022-12-20 DIAGNOSIS — Z136 Encounter for screening for cardiovascular disorders: Secondary | ICD-10-CM

## 2022-12-26 ENCOUNTER — Other Ambulatory Visit (HOSPITAL_COMMUNITY): Payer: Self-pay | Admitting: Cardiology

## 2022-12-26 MED ORDER — MEXILETINE HCL 150 MG PO CAPS: 150.0000 mg | ORAL_CAPSULE | Freq: Two times a day (BID) | ORAL | 3 refills | Status: DC

## 2022-12-26 NOTE — Progress Notes (Signed)
Remote ICD transmission.   

## 2022-12-26 NOTE — Addendum Note (Signed)
Addended by: Elease Etienne A on: 12/26/2022 11:27 AM   Modules accepted: Orders

## 2022-12-29 ENCOUNTER — Ambulatory Visit (HOSPITAL_COMMUNITY)
Admission: RE | Admit: 2022-12-29 | Discharge: 2022-12-29 | Disposition: A | Discharge status: Home / Self Care | Payer: No Typology Code available for payment source | Source: Ambulatory Visit | Attending: Family Medicine | Admitting: Family Medicine

## 2022-12-29 DIAGNOSIS — Z136 Encounter for screening for cardiovascular disorders: Secondary | ICD-10-CM | POA: Diagnosis present

## 2023-02-01 ENCOUNTER — Encounter (HOSPITAL_COMMUNITY): Payer: Self-pay | Admitting: Cardiology

## 2023-02-01 ENCOUNTER — Ambulatory Visit (HOSPITAL_COMMUNITY)
Admission: RE | Admit: 2023-02-01 | Discharge: 2023-02-01 | Disposition: A | Discharge status: Home / Self Care | Payer: No Typology Code available for payment source | Source: Ambulatory Visit | Attending: Cardiology | Admitting: Cardiology

## 2023-02-01 VITALS — BP 122/72 | HR 91 | Wt 295.4 lb

## 2023-02-01 DIAGNOSIS — G4733 Obstructive sleep apnea (adult) (pediatric): Secondary | ICD-10-CM | POA: Diagnosis not present

## 2023-02-01 DIAGNOSIS — M545 Low back pain, unspecified: Secondary | ICD-10-CM | POA: Diagnosis not present

## 2023-02-01 DIAGNOSIS — I5032 Chronic diastolic (congestive) heart failure: Secondary | ICD-10-CM | POA: Insufficient documentation

## 2023-02-01 DIAGNOSIS — Z7902 Long term (current) use of antithrombotics/antiplatelets: Secondary | ICD-10-CM | POA: Insufficient documentation

## 2023-02-01 DIAGNOSIS — E119 Type 2 diabetes mellitus without complications: Secondary | ICD-10-CM | POA: Diagnosis not present

## 2023-02-01 DIAGNOSIS — E669 Obesity, unspecified: Secondary | ICD-10-CM | POA: Diagnosis not present

## 2023-02-01 DIAGNOSIS — I255 Ischemic cardiomyopathy: Secondary | ICD-10-CM | POA: Insufficient documentation

## 2023-02-01 DIAGNOSIS — Z794 Long term (current) use of insulin: Secondary | ICD-10-CM | POA: Insufficient documentation

## 2023-02-01 DIAGNOSIS — Z7984 Long term (current) use of oral hypoglycemic drugs: Secondary | ICD-10-CM | POA: Diagnosis not present

## 2023-02-01 DIAGNOSIS — Z8674 Personal history of sudden cardiac arrest: Secondary | ICD-10-CM | POA: Insufficient documentation

## 2023-02-01 DIAGNOSIS — R002 Palpitations: Secondary | ICD-10-CM | POA: Diagnosis not present

## 2023-02-01 DIAGNOSIS — Z7985 Long-term (current) use of injectable non-insulin antidiabetic drugs: Secondary | ICD-10-CM | POA: Diagnosis not present

## 2023-02-01 DIAGNOSIS — Z79899 Other long term (current) drug therapy: Secondary | ICD-10-CM | POA: Insufficient documentation

## 2023-02-01 DIAGNOSIS — M4800 Spinal stenosis, site unspecified: Secondary | ICD-10-CM | POA: Diagnosis not present

## 2023-02-01 DIAGNOSIS — I493 Ventricular premature depolarization: Secondary | ICD-10-CM | POA: Insufficient documentation

## 2023-02-01 DIAGNOSIS — Z6841 Body Mass Index (BMI) 40.0 and over, adult: Secondary | ICD-10-CM | POA: Insufficient documentation

## 2023-02-01 DIAGNOSIS — I110 Hypertensive heart disease with heart failure: Secondary | ICD-10-CM | POA: Diagnosis present

## 2023-02-01 DIAGNOSIS — I2510 Atherosclerotic heart disease of native coronary artery without angina pectoris: Secondary | ICD-10-CM | POA: Insufficient documentation

## 2023-02-01 LAB — BASIC METABOLIC PANEL
Anion gap: 12 (ref 5–15)
BUN: 17 mg/dL (ref 8–23)
CO2: 23 mmol/L (ref 22–32)
Calcium: 8.9 mg/dL (ref 8.9–10.3)
Chloride: 103 mmol/L (ref 98–111)
Creatinine, Ser: 0.97 mg/dL (ref 0.61–1.24)
GFR, Estimated: >60 mL/min (ref >60)
Glucose, Bld: 153 mg/dL — ABNORMAL HIGH (ref 70–99)
Potassium: 4.2 mmol/L (ref 3.5–5.1)
Sodium: 138 mmol/L (ref 135–145)

## 2023-02-01 LAB — BRAIN NATRIURETIC PEPTIDE: B Natriuretic Peptide: 7.3 pg/mL (ref 0.0–100.0)

## 2023-02-01 LAB — LIPID PANEL
Cholesterol: 153 mg/dL (ref 0–200)
HDL: 34 mg/dL — ABNORMAL LOW (ref >40)
LDL Cholesterol: 67 mg/dL (ref 0–99)
Total CHOL/HDL Ratio: 4.5 {ratio}
Triglycerides: 258 mg/dL — ABNORMAL HIGH (ref <150)
VLDL: 52 mg/dL — ABNORMAL HIGH (ref 0–40)

## 2023-02-01 MED ORDER — ATORVASTATIN CALCIUM 80 MG PO TABS: 80.0000 mg | ORAL_TABLET | Freq: Every day | ORAL | 3 refills | Status: DC

## 2023-02-01 MED ORDER — ENTRESTO 24-26 MG PO TABS: 1.0000 | ORAL_TABLET | Freq: Two times a day (BID) | ORAL | 3 refills | Status: AC

## 2023-02-01 MED ORDER — CARVEDILOL 6.25 MG PO TABS: 6.2500 mg | ORAL_TABLET | Freq: Two times a day (BID) | ORAL | 3 refills | Status: DC

## 2023-02-01 MED ORDER — POTASSIUM CHLORIDE CRYS ER 10 MEQ PO TBCR: 10.0000 meq | EXTENDED_RELEASE_TABLET | Freq: Every day | ORAL | 3 refills | Status: DC

## 2023-02-01 MED ORDER — CLOPIDOGREL BISULFATE 75 MG PO TABS: 75.0000 mg | ORAL_TABLET | Freq: Every day | ORAL | 3 refills | Status: DC

## 2023-02-01 MED ORDER — FUROSEMIDE 20 MG PO TABS: 20.0000 mg | ORAL_TABLET | Freq: Every day | ORAL | 3 refills | Status: DC

## 2023-02-01 MED ORDER — EMPAGLIFLOZIN 25 MG PO TABS: ORAL_TABLET | ORAL | 3 refills | Status: AC

## 2023-02-01 NOTE — Patient Instructions (Signed)
There has been no changes to your medications.  Labs done today, your results will be available in MyChart, we will contact you for abnormal readings.  Your physician has requested that you have an echocardiogram. Echocardiography is a painless test that uses sound waves to create images of your heart. It provides your doctor with information about the size and shape of your heart and how well your heart's chambers and valves are working. This procedure takes approximately one hour. There are no restrictions for this procedure. Please do NOT wear cologne, perfume, aftershave, or lotions (deodorant is allowed). Please arrive 15 minutes prior to your appointment time.  Please note: We ask at that you not bring children with you during ultrasound (echo/ vascular) testing. Due to room size and safety concerns, children are not allowed in the ultrasound rooms during exams. Our front office staff cannot provide observation of children in our lobby area while testing is being conducted. An adult accompanying a patient to their appointment will only be allowed in the ultrasound room at the discretion of the ultrasound technician under special circumstances. We apologize for any inconvenience.  Your physician recommends that you schedule a follow-up appointment in: 6 months ( June) ** PLEASE CALL THE OFFICE IN APRIL TO ARRANGE YOUR FOLLOW UP APPOINTMENT. **  If you have any questions or concerns before your next appointment please send [place] a message through [place] or call our office at [phone].    TO LEAVE A MESSAGE FOR THE NURSE SELECT OPTION 2, PLEASE LEAVE A MESSAGE INCLUDING: YOUR NAME DATE OF BIRTH CALL BACK NUMBER REASON FOR CALL**this is important as we prioritize the call backs  YOU WILL RECEIVE A CALL BACK THE SAME DAY AS LONG AS YOU CALL BEFORE 4:00 PM  At the [facility], you and your health needs are our priority. As part of our continuing mission to provide you with  exceptional heart care, we have created designated Provider Care Teams. These Care Teams include your primary Cardiologist (physician) and Advanced Practice Providers (APPs- Physician Assistants and Nurse Practitioners) who all work together to provide you with the care you need, when you need it.   You may see any of the following providers on your designated Care Team at your next follow up: Dr Arvilla Meres Dr Marca Ancona Dr. Dorthula Nettles Dr. Clearnce Hasten Amy Filbert Schilder, NP Robbie Lis, [place] [facility] [place], [place] Brynda Peon, NP [place], NP Karle Plumber, PharmD   Please be sure to bring in all your medications bottles to every appointment.    Thank you for choosing [place] [facility]

## 2023-02-02 ENCOUNTER — Telehealth (HOSPITAL_COMMUNITY): Payer: Self-pay

## 2023-02-02 DIAGNOSIS — I5032 Chronic diastolic (congestive) heart failure: Secondary | ICD-10-CM

## 2023-02-02 MED ORDER — EZETIMIBE 10 MG PO TABS: 10.0000 mg | ORAL_TABLET | Freq: Every day | ORAL | 3 refills | Status: DC

## 2023-02-02 NOTE — Progress Notes (Signed)
Advanced Heart Failure Clinic Note   PCP: [facility] EP: Dr. Lalla Brothers HF Cardiologist: Dr. Shirlee Latch  Chief Complaint: CHF  HPI: 68 y.o. male w/ HTN, DM2, HLD, VF arrest, and ischemic cardiomyopathy.    He suffered witnessed out of hospital cardiac arrest 05/19/20 w/ immediate bystander CPR, initiated by wife, and prompt activation of EMS. Per report, had prolonged CPR, more than 45 min prior to ROSC. Underlying rhythm was VFib. Bedside echo showed EF 40-50% inferobasal severe hypokinesis with septal bounce.  RV systolic function normal. 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 No other disease. LM, LAD and LCx all widely patent. No indication for PCI.  Suspect scar-mediated VF.  He required mechanical ventilation and pressor support, but did not require cardiac mechanical support. 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 He received a Medtronic ICD for secondary prevention. He was discharged to CIR.   Echo in 2/23 showed EF 50-55% with inferior hypokinesis, normal RV, and normal IVC.   Zio 7 day (5/24) showed mostly NSR, 19% PVC burden. Coreg increased and mexiletine 150 mg bid added.  Echo 6/24 showed EF 55-60%, G1DD, normal RV.  Zio monitor in 9/24 showed PVC burden down to 1.7% on mexiletine.   Today he returns for HF follow up. Weight is down 3 lbs.  He is getting semaglutide.  Main problem currently is low back pain from spinal stenosis.  He is going to be getting an epidural steroid injection.  He has some tenderness in his right chest wall but no exertional chest pain.  He is short of breath walking 200-300 feet, mild dyspnea walking up stairs.  He is using his CPAP. No orthopnea/PND.   Medtronic device interrogation: no AF/VT, stable  thoracic impedance.   ECG (personally reviewed): NSR, 1st degree AVB, RBBB  Labs (8/22): K 4.5, creatinine 1.03 Labs (11/22): K 4.3, creatinine 1.04 Labs (2/23): TGs 357, LDL 54, K 4.1, creatinine 1.61 Labs (9/23): K 4.0, creatinine 1.03, LDL 71, HDL 31 Labs (10/23): K 4, creatinine 1.04, BNP 12 Labs (5/24): LDL 66, TGs 200 Labs (6/24): K 4.6, creatinine 1.03 Labs (9/24): K 4.2, creatinine 0.97, BNP 11.2  PMH: 1. Type 2 diabetes 2. GERD 3. HTN 4. Hyperlipidemia.  5. 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  - Medtronic ICD.  6. CAD: LHC 5/22 showed chronic occlusion of RCA with L=>R collaterals.  7. Transient complete heart block in 5/22.  8. Ischemic cardiomyopathy: Echo post-arrest in 5/22 showed EF 45%, repeat echo in 5/22 showed EF up to 60-65%.  - Echo (2/23): EF 50-55% with inferior hypokinesis, normal RV, and normal IVC.  - Echo (6/24): EF 50-55%, G1DD, normal RV, normal IVC. 9. OSA: Uses CPAP.  10. PVC: 7-day Zio (5/24) with 19% PVC burden. Started mexelitine. - Zio monitor (9/24): 1.7% PVCs.   ROS: All systems negative except as listed in HPI, PMH and Problem List.  SH:  Social History   Socioeconomic History   Marital status: Married    Spouse name: Not on file   Number of children: Not on file   Years of education: Not on file   Highest education level: Not on file  Occupational History   Not on file  Tobacco Use   Smoking status: Former    Current packs/day: 0.00  Average packs/day: 1.5 packs/day for 42.0 years (63.0 ttl pk-yrs)    Types: Cigarettes    Start date: 09/04/1971    Quit date: 09/03/2013    Years since quitting: 9.4   Smokeless tobacco: Former    Types: Chew   Tobacco comments:    We discussed the need to remain smoke free and to call me for help if he has the desire to smoke again.  Vaping Use   Vaping status: Never Used  Substance and Sexual Activity   Alcohol use: No    Alcohol/week: 0.0 standard drinks of  alcohol    Comment: recovering alcoholic    Drug use: No   Sexual activity: Not on file  Other Topics Concern   Not on file  Social History Narrative   Recovering alcoholic      Minister    Social Drivers of Health   Financial Resource Strain: Low Risk  (05/29/2020)   Overall Financial Resource Strain (CARDIA)    Difficulty of Paying Living Expenses: Not very hard  Food Insecurity: No Food Insecurity (05/29/2020)   Hunger Vital Sign    Worried About Running Out of Food in the Last Year: Never true    Ran Out of Food in the Last Year: Never true  Transportation Needs: No Transportation Needs (05/29/2020)   PRAPARE - Administrator, Civil Service (Medical): No    Lack of Transportation (Non-Medical): No  Physical Activity: Not on file  Stress: Not on file  Social Connections: Not on file  Intimate Partner Violence: Not on file   FH:  Family History  Problem Relation Age of Onset   Cancer Mother        lung   Crohn's disease Father    Alcohol abuse Brother    Colon polyps Brother    Colon cancer Neg Hx    Current Outpatient Medications  Medication Sig Dispense Refill   carboxymethylcellulose (REFRESH PLUS) 0.5 % SOLN Place 1 drop into both eyes 3 (three) times daily as needed (dry eyes).     cyclobenzaprine (FLEXERIL) 5 MG tablet Take 5 mg by mouth as needed for muscle spasms.     diclofenac sodium (VOLTAREN) 1 % GEL Apply 4 g topically 3 (three) times daily as needed (pain). 350 g 0   docusate sodium (COLACE) 100 MG capsule Take 1 capsule (100 mg total) by mouth 2 (two) times daily. 28 capsule 0   famotidine (PEPCID) 40 MG tablet Take 40 mg by mouth daily as needed for heartburn or indigestion.     gabapentin (NEURONTIN) 400 MG capsule Take 400-800 mg by mouth 4 (four) times daily.     icosapent Ethyl (VASCEPA) 1 g capsule Take 2 capsules (2 g total) by mouth 2 (two) times daily. 120 capsule 11   insulin aspart (NOVOLOG) 100 UNIT/ML injection Inject 10 Units into  the skin 3 (three) times daily before meals. Sliding scale     insulin glargine (LANTUS) 100 UNIT/ML injection Inject 80 Units into the skin at bedtime.     loratadine (CLARITIN) 10 MG tablet Take 10 mg by mouth daily.     mexiletine (MEXITIL) 150 MG capsule Take 1 capsule (150 mg total) by mouth 2 (two) times daily. 180 capsule 3   Multiple Vitamins-Minerals (MULTIVITAMIN WITH MINERALS) tablet Take 1 tablet by mouth daily.     nitroGLYCERIN (NITROSTAT) 0.4 MG SL tablet Place 1 tablet (0.4 mg total) under the tongue every 5 (five) minutes x 3 doses as  needed for chest pain. Call MD if you need two or more doses 30 tablet 12   oxyCODONE-acetaminophen (PERCOCET) 10-325 MG tablet Take 1 tablet by mouth as needed for pain.     oxyCODONE-acetaminophen (PERCOCET/ROXICET) 5-325 MG tablet Take 1 tablet by mouth every 6 (six) hours as needed for severe pain. 10 tablet 0   pantoprazole (PROTONIX) 40 MG tablet Take 40 mg by mouth 2 (two) times daily before a meal.     Polyethyl Glycol-Propyl Glycol (SYSTANE) 0.4-0.3 % GEL ophthalmic gel Place 1 application. into both eyes every 6 (six) hours as needed (dry eyes).     Semaglutide, 2 MG/DOSE, 8 MG/3ML SOPN Inject 2 mg into the skin once a week.     atorvastatin (LIPITOR) 80 MG tablet Take 1 tablet (80 mg total) by mouth at bedtime. 90 tablet 3   carvedilol (COREG) 6.25 MG tablet Take 1 tablet (6.25 mg total) by mouth 2 (two) times daily. 180 tablet 3   clopidogrel (PLAVIX) 75 MG tablet Take 1 tablet (75 mg total) by mouth daily. 90 tablet 3   empagliflozin (JARDIANCE) 25 MG TABS tablet Take 1/2 tablet daily 45 tablet 3   ezetimibe (ZETIA) 10 MG tablet Take 1 tablet (10 mg total) by mouth daily. 90 tablet 3   furosemide (LASIX) 20 MG tablet Take 1 tablet (20 mg total) by mouth daily. 90 tablet 3   potassium chloride (KLOR-CON M) 10 MEQ tablet Take 1 tablet (10 mEq total) by mouth daily. 90 tablet 3   sacubitril-valsartan (ENTRESTO) 24-26 MG Take 1 tablet by  mouth 2 (two) times daily. 180 tablet 3   No current facility-administered medications for this encounter.   BP 122/72   Pulse 91   Wt 134 kg (295 lb 6.4 oz)   SpO2 96%   BMI 40.06 kg/m   Wt Readings from Last 3 Encounters:  02/01/23 134 kg (295 lb 6.4 oz)  09/08/22 135.3 kg (298 lb 3.2 oz)  05/06/22 134.8 kg (297 lb 2 oz)   PHYSICAL EXAM: General: NAD Neck: No JVD, no thyromegaly or thyroid nodule.  Lungs: Clear to auscultation bilaterally with normal respiratory effort. CV: Nondisplaced PMI.  Heart regular S1/S2, no S3/S4, no murmur.  Trace ankle edema.  No carotid bruit.  Normal pedal pulses.  Abdomen: Soft, nontender, no hepatosplenomegaly, no distention.  Skin: Intact without lesions or rashes.  Neurologic: Alert and oriented x 3.  Psych: Normal affect. Extremities: No clubbing or cyanosis.  HEENT: Normal.   ASSESSMENT & PLAN:  1. Chronic diastolic CHF: Ischemic cardiomyopathy, EF in the 45% range on initial echo in 5/22 but back up to 60-65% on repeat limited echo also in 5/22.  Echo in 2/23 showed EF 50-55% with inferior hypokinesis.  Echo 6/24 showed EF 55-60%, G1DD, normal RV. NYHA class III, not volume overloaded on exam.  - Continue Lasix 20 mg daily, BMET/BNP today.  - Continue Coreg 6.25 mg bid.  - Continue Entresto 24/26 mg bid.  - Continue Jardiance.  - Repeat echo at followup in 6/25.  If EF remains in normal range, will not need as frequently.  2. CAD: LHC in 5/22 showed CTO of the mid RCA with good collaterals.  Do not think this was acute MI from plaque rupture.   - Continue Plavix long-term, so no ASA.  - Continue statin/Vascepa, check lipids today.  3. Cardiac arrest: In 5/22. Suspect scar-mediated from old RCA occlusion. EF now low normal to mildly reduced.  Medtronic ICD for  secondary prevention.   4. Diabetes: Per PCP.  5. 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  No further HB. 6. HTN: BP controlled. - GDMT as above. 7. Obesity:  Body  mass index is 40.06 kg/m. - Continue semaglutide.  8. PVCs: Patient noted to have PVCs and palpitations. 7 day Zio monitor (6/24) showed 19% PVC burden. Repeat Zio monitor in 9/24 on mexiletine showed 1.7% PVCs.  - Continue Coreg - Continue mexiletine 150 mg bid 9. OSA: Continue CPAP.                                                                                                                                                                                                                                                                                                                                          Follow up with echo in 6/24.   I spent 31 minutes reviewing records, interviewing/examining patient, and managing orders.    Marca Ancona  02/02/2023

## 2023-02-02 NOTE — Telephone Encounter (Signed)
Patient's Zetia medication has been sent to his Pharmacy at [facility]. In addition, patient's labs has been placed and appointment scheduled. Pt aware, agreeable, and verbalized understanding.

## 2023-02-02 NOTE — Telephone Encounter (Signed)
-----   Message from Marca Ancona sent at 02/01/2023 10:57 PM EST ----- Goal LDL < 55, add Zetia 10 mg daily with lipids in 2 months.

## 2023-02-10 ENCOUNTER — Other Ambulatory Visit (HOSPITAL_COMMUNITY): Payer: Self-pay | Admitting: Cardiology

## 2023-02-10 DIAGNOSIS — I5032 Chronic diastolic (congestive) heart failure: Secondary | ICD-10-CM

## 2023-02-10 MED ORDER — EZETIMIBE 10 MG PO TABS: 10.0000 mg | ORAL_TABLET | Freq: Every day | ORAL | 3 refills | Status: DC

## 2023-02-16 ENCOUNTER — Other Ambulatory Visit (HOSPITAL_COMMUNITY): Payer: Self-pay | Admitting: Cardiology

## 2023-02-16 MED ORDER — ICOSAPENT ETHYL 1 G PO CAPS: 2.0000 g | ORAL_CAPSULE | Freq: Two times a day (BID) | ORAL | 3 refills | Status: AC

## 2023-03-03 ENCOUNTER — Ambulatory Visit (INDEPENDENT_AMBULATORY_CARE_PROVIDER_SITE_OTHER): Payer: Self-pay

## 2023-03-03 DIAGNOSIS — I255 Ischemic cardiomyopathy: Secondary | ICD-10-CM

## 2023-03-06 LAB — CUP PACEART REMOTE DEVICE CHECK
Battery Remaining Longevity: 102 mo
Battery Voltage: 3 V
Brady Statistic AP VP Percent: 0.01 %
Brady Statistic AP VS Percent: 5.47 %
Brady Statistic AS VP Percent: 0.04 %
Brady Statistic AS VS Percent: 94.48 %
Brady Statistic RA Percent Paced: 5.47 %
Brady Statistic RV Percent Paced: 0.05 %
Date Time Interrogation Session: 20250228022604
HighPow Impedance: 97 Ohm
Implantable Lead Connection Status: 753985
Implantable Lead Connection Status: 753985
Implantable Lead Implant Date: 20220526
Implantable Lead Implant Date: 20220526
Implantable Lead Location: 753859
Implantable Lead Location: 753860
Implantable Lead Model: 5076
Implantable Pulse Generator Implant Date: 20220526
Lead Channel Impedance Value: 418 Ohm
Lead Channel Impedance Value: 456 Ohm
Lead Channel Impedance Value: 513 Ohm
Lead Channel Pacing Threshold Amplitude: 0.5 V
Lead Channel Pacing Threshold Amplitude: 0.625 V
Lead Channel Pacing Threshold Pulse Width: 0.4 ms
Lead Channel Pacing Threshold Pulse Width: 0.4 ms
Lead Channel Sensing Intrinsic Amplitude: 3 mV
Lead Channel Sensing Intrinsic Amplitude: 3 mV
Lead Channel Sensing Intrinsic Amplitude: 6.125 mV
Lead Channel Sensing Intrinsic Amplitude: 6.125 mV
Lead Channel Setting Pacing Amplitude: 1.5 V
Lead Channel Setting Pacing Amplitude: 2 V
Lead Channel Setting Pacing Pulse Width: 0.4 ms
Lead Channel Setting Sensing Sensitivity: 0.3 mV
Zone Setting Status: 755011
Zone Setting Status: 755011

## 2023-03-07 ENCOUNTER — Encounter: Payer: Self-pay | Admitting: Cardiology

## 2023-03-30 ENCOUNTER — Ambulatory Visit (HOSPITAL_COMMUNITY)
Admission: RE | Admit: 2023-03-30 | Discharge: 2023-03-30 | Disposition: A | Discharge status: Home / Self Care | Payer: No Typology Code available for payment source | Source: Ambulatory Visit | Attending: Cardiology | Admitting: Cardiology

## 2023-03-30 DIAGNOSIS — I5032 Chronic diastolic (congestive) heart failure: Secondary | ICD-10-CM | POA: Diagnosis present

## 2023-03-30 LAB — LIPID PANEL
Cholesterol: 109 mg/dL (ref 0–200)
HDL: 34 mg/dL — ABNORMAL LOW (ref >40)
LDL Cholesterol: 53 mg/dL (ref 0–99)
Total CHOL/HDL Ratio: 3.2 ratio
Triglycerides: 110 mg/dL (ref <150)
VLDL: 22 mg/dL (ref 0–40)

## 2023-03-31 NOTE — Addendum Note (Signed)
 Addended by: Elease Etienne A on: 03/31/2023 02:32 PM   Modules accepted: Orders

## 2023-03-31 NOTE — Progress Notes (Signed)
 Remote ICD transmission.

## 2023-04-26 ENCOUNTER — Telehealth (HOSPITAL_COMMUNITY): Payer: Self-pay | Admitting: Cardiology

## 2023-04-26 DIAGNOSIS — I5032 Chronic diastolic (congestive) heart failure: Secondary | ICD-10-CM

## 2023-04-26 NOTE — Telephone Encounter (Signed)
 [facility]

## 2023-04-26 NOTE — Addendum Note (Signed)
 Addended by: Edris Gowers on: 04/26/2023 10:42 AM   Modules accepted: Orders

## 2023-06-06 ENCOUNTER — Ambulatory Visit (INDEPENDENT_AMBULATORY_CARE_PROVIDER_SITE_OTHER)

## 2023-06-06 DIAGNOSIS — I255 Ischemic cardiomyopathy: Secondary | ICD-10-CM | POA: Diagnosis not present

## 2023-06-06 LAB — CUP PACEART REMOTE DEVICE CHECK
Battery Remaining Longevity: 99 mo
Battery Voltage: 3 V
Brady Statistic AP VP Percent: 0.01 %
Brady Statistic AP VS Percent: 6.26 %
Brady Statistic AS VP Percent: 0.05 %
Brady Statistic AS VS Percent: 93.68 %
Brady Statistic RA Percent Paced: 6.25 %
Brady Statistic RV Percent Paced: 0.06 %
Date Time Interrogation Session: 20250603012405
HighPow Impedance: 87 Ohm
Implantable Lead Connection Status: 753985
Implantable Lead Connection Status: 753985
Implantable Lead Implant Date: 20220526
Implantable Lead Implant Date: 20220526
Implantable Lead Location: 753859
Implantable Lead Location: 753860
Implantable Lead Model: 5076
Implantable Pulse Generator Implant Date: 20220526
Lead Channel Impedance Value: 418 Ohm
Lead Channel Impedance Value: 418 Ohm
Lead Channel Impedance Value: 513 Ohm
Lead Channel Pacing Threshold Amplitude: 0.5 V
Lead Channel Pacing Threshold Amplitude: 0.625 V
Lead Channel Pacing Threshold Pulse Width: 0.4 ms
Lead Channel Pacing Threshold Pulse Width: 0.4 ms
Lead Channel Sensing Intrinsic Amplitude: 2.625 mV
Lead Channel Sensing Intrinsic Amplitude: 2.625 mV
Lead Channel Sensing Intrinsic Amplitude: 6.625 mV
Lead Channel Sensing Intrinsic Amplitude: 6.625 mV
Lead Channel Setting Pacing Amplitude: 1.5 V
Lead Channel Setting Pacing Amplitude: 2 V
Lead Channel Setting Pacing Pulse Width: 0.4 ms
Lead Channel Setting Sensing Sensitivity: 0.3 mV
Zone Setting Status: 755011
Zone Setting Status: 755011

## 2023-06-10 ENCOUNTER — Ambulatory Visit: Payer: Self-pay | Admitting: Cardiology

## 2023-07-28 ENCOUNTER — Telehealth (HOSPITAL_COMMUNITY): Payer: Self-pay

## 2023-07-28 NOTE — Telephone Encounter (Signed)
 Patient called stating that he needs to renew [facility] referral. Please advise

## 2023-08-03 NOTE — Progress Notes (Signed)
 Remote ICD transmission.

## 2023-08-04 ENCOUNTER — Encounter: Admitting: Student

## 2023-08-06 NOTE — Progress Notes (Deleted)
 Cardiology Office Note Date:  08/06/2023  Patient ID:  [name], [name] [birth year], MRN [id number] PCP:  [facility]  Cardiologist:  Dr. Rolan Electrophysiologist: Dr. Cindie    Chief Complaint: *** overdue annual visit  History of Present Illness: [name] is a 68 y.o. male with history of  HTN, HLD, DM, OSA CAD, ICM, VF arrest (May 2022), chronic CHF (diastolic)    I saw him Sept 2023 He is feeling well No ongoing dizziness, weakness s/p IVF from the ER Infrequently uses lasix , BP tends to rune low No CP, palpitations, denies SOB No no ear syncope or syncope. No shocks He has worn many hats over the years, Hotel manager, firefighter, Teaching laboratory technician, and minister He is limited almost entirely by orthopedic pains especially back pain No arrhythmias No changes made  Following regularly with AHF team Last saw Dr. 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, developed CHB  > stopped  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    Past Surgical History:  Procedure Laterality Date   APPENDECTOMY     CERVICAL FUSION     CERVICAL LAMINECTOMY     COLONOSCOPY     CORONARY/GRAFT ACUTE MI REVASCULARIZATION N/A 05/19/2020   Procedure: Coronary/Graft Acute MI Revascularization;  Surgeon: Claudene Victory ORN, MD;  Location: [facility] INVASIVE CV LAB;  Service: Cardiovascular;  Laterality: N/A;   HAND SURGERY     HARDWARE REMOVAL Left 09/10/2012   Procedure: HARDWARE REMOVAL;  Surgeon: Lynwood FORBES Better, MD;  Location: [facility] OR;  Service: Orthopedics;  Laterality: Left;   HIP SURGERY  12/29/09   R THR  Brant)   HIP SURGERY     bilateral   ICD IMPLANT N/A 05/28/2020   Procedure: ICD IMPLANT;  Surgeon: Cindie Ole DASEN, MD;  Location: [facility] INVASIVE CV LAB;  Service: Cardiovascular;  Laterality: N/A;   LEFT HEART CATH AND CORONARY ANGIOGRAPHY N/A 05/19/2020   Procedure: LEFT HEART CATH AND CORONARY ANGIOGRAPHY;  Surgeon: Claudene Victory ORN, MD;  Location: [facility] INVASIVE CV LAB;  Service: Cardiovascular;  Laterality: N/A;   TOTAL HIP ARTHROPLASTY Left 09/10/2012   Procedure: REMOVAL OF HARDWARE LEFT PROXIMAL THIGH/FEMUR AND LEFT TOTAL HIP ARTHROPLASTY;  Surgeon: Lynwood FORBES Better, MD;  Location: [facility] OR;  Service: Orthopedics;  Laterality: Left;   TOTAL KNEE ARTHROPLASTY  01/2008   left knee    Current Outpatient Medications  Medication Sig Dispense Refill   atorvastatin  (LIPITOR ) 80 MG tablet Take 1 tablet (80 mg total) by mouth at bedtime. 90 tablet 3   carboxymethylcellulose (REFRESH PLUS) 0.5 % SOLN Place 1 drop into both eyes 3 (three) times daily as needed (dry eyes).  carvedilol  (COREG ) 6.25 MG tablet Take 1 tablet (6.25 mg total) by mouth 2 (two) times daily. 180 tablet 3   clopidogrel  (PLAVIX ) 75 MG tablet Take 1 tablet (75 mg total) by mouth daily. 90 tablet 3   cyclobenzaprine  (FLEXERIL ) 5 MG tablet Take 5 mg by mouth as needed for muscle spasms.     diclofenac  sodium (VOLTAREN ) 1 % GEL Apply 4 g topically 3 (three) times daily as needed (pain). 350 g 0   docusate sodium  (COLACE) 100 MG capsule Take 1 capsule (100 mg total) by mouth 2 (two) times daily. 28 capsule 0   empagliflozin  (JARDIANCE ) 25 MG TABS tablet Take 1/2 tablet daily 45 tablet 3   ezetimibe  (ZETIA ) 10 MG tablet Take 1 tablet (10 mg total) by mouth  daily. 90 tablet 3   famotidine (PEPCID) 40 MG tablet Take 40 mg by mouth daily as needed for heartburn or indigestion.     furosemide  (LASIX ) 20 MG tablet Take 1 tablet (20 mg total) by mouth daily. 90 tablet 3   gabapentin  (NEURONTIN ) 400 MG capsule Take 400-800 mg by mouth 4 (four) times daily.     icosapent  Ethyl (VASCEPA ) 1 g capsule Take 2 capsules (2 g total) by mouth 2 (two) times daily. 360 capsule 3   insulin  aspart (NOVOLOG ) 100 UNIT/ML injection Inject 10 Units into the skin 3 (three) times daily before meals. Sliding scale     insulin  glargine (LANTUS ) 100 UNIT/ML injection Inject 80 Units into the skin at bedtime.     loratadine (CLARITIN) 10 MG tablet Take 10 mg by mouth daily.     mexiletine (MEXITIL) 150 MG capsule Take 1 capsule (150 mg total) by mouth 2 (two) times daily. 180 capsule 3   Multiple Vitamins-Minerals (MULTIVITAMIN WITH MINERALS) tablet Take 1 tablet by mouth daily.     nitroGLYCERIN  (NITROSTAT ) 0.4 MG SL tablet Place 1 tablet (0.4 mg total) under the tongue every 5 (five) minutes x 3 doses as needed for chest pain. Call MD if you need two or more doses 30 tablet 12   oxyCODONE -acetaminophen  (PERCOCET) 10-325 MG tablet Take 1 tablet by mouth as needed for pain.     oxyCODONE -acetaminophen  (PERCOCET/ROXICET) 5-325 MG tablet Take 1 tablet by mouth every 6 (six) hours as needed for severe pain. 10 tablet 0   pantoprazole  (PROTONIX ) 40 MG tablet Take 40 mg by mouth 2 (two) times daily before a meal.     Polyethyl Glycol-Propyl Glycol (SYSTANE) 0.4-0.3 % GEL ophthalmic gel Place 1 application. into both eyes every 6 (six) hours as needed (dry eyes).     potassium chloride  (KLOR-CON  M) 10 MEQ tablet Take 1 tablet (10 mEq total) by mouth daily. 90 tablet 3   sacubitril-valsartan (ENTRESTO ) 24-26 MG Take 1 tablet by mouth 2 (two) times daily. 180 tablet 3   Semaglutide, 2 MG/DOSE, 8 MG/3ML SOPN Inject 2 mg into the skin once a week.     No current facility-administered  medications for this visit.    Allergies:   Naproxen, Toradol [ketorolac tromethamine], Codeine, and Morphine  and codeine   Social History:  The patient  reports that he quit smoking about 9 years ago. His smoking use included cigarettes. He started smoking about 51 years ago. He has a 63 pack-year smoking history. He has quit using smokeless tobacco.  His smokeless tobacco use included chew. He reports that he does not drink alcohol  and does not use drugs.   Family History:  The patient's family history includes  Alcohol  abuse in his brother; Cancer in his mother; Colon polyps in his brother; Crohn's disease in his father.  ROS:  Please see the history of present illness.    All other systems are reviewed and otherwise negative.   PHYSICAL EXAM:  VS:  There were no vitals taken for this visit. BMI: There is no height or weight on file to calculate BMI. Well nourished, well developed, in no acute distress HEENT: normocephalic, atraumatic Neck: no JVD, carotid bruits or masses Cardiac: *** RRR; no significant murmurs, no rubs, or gallops Lungs: *** CTA b/l, no wheezing, rhonchi or rales Abd: soft, nontender MS: no deformity or atrophy Ext: trace edema Skin: warm and dry, no rash Neuro: *** No gross deficits appreciated Psych: euthymic mood, full affect  *** ICD site is stable, no tethering or discomfort   EKG:  not done today  Device interrogation done today and reviewed by myself:  *** Battery and lead measurements are good ***No arrhythmias ***VP *** B>AX programmed where needed   02/26/2021: TTE 1. Left ventricular ejection fraction, by estimation, is 60 to 65%. The  left ventricle has normal function. The left ventricle has no regional  wall motion abnormalities. Left ventricular diastolic parameters are  consistent with Grade I diastolic  dysfunction (impaired relaxation).   2. Right ventricular systolic function is normal. The right ventricular  size is normal.   3.  The mitral valve is normal in structure. No evidence of mitral valve  regurgitation. No evidence of mitral stenosis.   4. The aortic valve is normal in structure. Aortic valve regurgitation is  not visualized. No aortic stenosis is present.   5. The inferior vena cava is normal in size with greater than 50%  respiratory variability, suggesting right atrial pressure of 3 mmHg.   Comparison(s): No significant change from prior study. Prior images  reviewed side by side.    05/19/2020; LHC 100% occlusion of the mid RCA, likely representing a chronic total occlusion given absence of thrombus staining in the area of occlusion and well-formed left-to-right collaterals. Widely patent left main Widely patent LAD Widely patent circumflex LVEDP 14 initially and following angiography and IV fluid 20 mmHg   RECOMMENDATIONS:   IV dobutamine  to improve potential RV dysfunction. IV fluids as tolerated A central line will be placed in the unit to measure co-oximetry and get right heart filling pressures. Continue Levophed , epinephrine , IV fentanyl , and intermittent Versed  as needed.  Wean Levophed  and epinephrine  as tolerated. Advanced heart failure team will help manage. Critical care team, Dr. Brutus managing primarily.   05/20/2020: TTE 1. Left ventricular ejection fraction, by estimation, is 40 to 45%. The  left ventricle has mildly decreased function. The left ventricle  demonstrates regional wall motion abnormalities (basal and mid  inferolateral suggestive of RCA disease). There is  mild concentric left ventricular hypertrophy. Left ventricular diastolic  parameters are indeterminate.   2. Right ventricular systolic function is hyperdynamic. The right  ventricular size is normal.   3. A small pericardial effusion is present.   4. The mitral valve is grossly normal. No evidence of mitral valve  regurgitation.   5. The aortic valve was not well visualized. Aortic valve regurgitation  is  not visualized.   6. Aortic dilatation noted. There is borderline dilatation of the aortic  root, measuring 38 mm.   7. The inferior vena cava is dilated in size with <50% respiratory  variability, suggesting right atrial pressure of 15 mmHg.   Comparison(s): No  prior Echocardiogram.   Recent Labs: 02/01/2023: B Natriuretic Peptide 7.3; BUN 17; Creatinine, Ser 0.97; Potassium 4.2; Sodium 138  03/30/2023: Cholesterol 109; HDL 34; LDL Cholesterol 53; Total CHOL/HDL Ratio 3.2; Triglycerides 110; VLDL 22   CrCl cannot be calculated (Patient's most recent lab result is older than the maximum 21 days allowed.).   Wt Readings from Last 3 Encounters:  02/01/23 295 lb 6.4 oz (134 kg)  09/08/22 298 lb 3.2 oz (135.3 kg)  05/06/22 297 lb 2 oz (134.8 kg)     Other studies reviewed: Additional studies/records reviewed today include: summarized above  ASSESSMENT AND PLAN:  ICD *** Intact function *** B>AX where needed  C.arrest Off AAD prior to his discharge *** No arrhythmias  CAD *** No CP C/w with Dr. Marciano  ICM Chronic CHF (recovered LVEF diastolic) *** BP limits GDMT C/w Dr. Marciano    Disposition: F/u with remotes as usual, in clinic in ***, sooner if needed  Current medicines are reviewed at length with the patient today.  The patient did not have any concerns regarding medicines.  Bonney Charlies Arthur, PA-C 08/06/2023 9:18 AM     [facility] [address] [place] [address] [phone] (office)  [phone] (fax)

## 2023-08-08 ENCOUNTER — Ambulatory Visit: Admitting: Physician Assistant

## 2023-08-25 NOTE — Telephone Encounter (Signed)
 new referral faxed to [facility]  fax# [phone]

## 2023-08-31 ENCOUNTER — Ambulatory Visit: Admitting: Physician Assistant

## 2023-09-05 ENCOUNTER — Ambulatory Visit (INDEPENDENT_AMBULATORY_CARE_PROVIDER_SITE_OTHER)

## 2023-09-05 DIAGNOSIS — I255 Ischemic cardiomyopathy: Secondary | ICD-10-CM

## 2023-09-06 NOTE — Progress Notes (Unsigned)
 Cardiology Office Note Date:  09/06/2023  Patient ID:  [name], [name] 06/18/1955, MRN [id number] PCP:  [facility]  Cardiologist:  Dr. Rolan Electrophysiologist: Dr. Cindie    Chief Complaint: *** overdue annual visit  History of Present Illness: [name] is a 68 y.o. male with history of  HTN, HLD, DM, OSA CAD, ICM, VF arrest (May 2022), chronic CHF (diastolic)    I saw him Sept 2023 He is feeling well No ongoing dizziness, weakness s/p IVF from the ER Infrequently uses lasix , BP tends to rune low No CP, palpitations, denies SOB No no ear syncope or syncope. No shocks He has worn many hats over the years, Hotel manager, firefighter, Teaching laboratory technician, and minister He is limited almost entirely by orthopedic pains especially back pain No arrhythmias No changes made  Following regularly with AHF team Last saw Dr. 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, developed CHB  > stopped  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    Past Surgical History:  Procedure Laterality Date   APPENDECTOMY     CERVICAL FUSION     CERVICAL LAMINECTOMY     COLONOSCOPY     CORONARY/GRAFT ACUTE MI REVASCULARIZATION N/A 05/19/2020   Procedure: Coronary/Graft Acute MI Revascularization;  Surgeon: Claudene Victory ORN, MD;  Location: [facility] INVASIVE [facility];  Service: Cardiovascular;  Laterality: N/A;   HAND SURGERY     HARDWARE REMOVAL Left 09/10/2012   Procedure: HARDWARE REMOVAL;  Surgeon: Lynwood FORBES Better, MD;  Location: [facility] OR;  Service: Orthopedics;  Laterality: Left;   HIP SURGERY  12/29/09   R THR  Brant)   HIP SURGERY     bilateral   ICD IMPLANT N/A 05/28/2020   Procedure: ICD IMPLANT;  Surgeon: Cindie Ole DASEN, MD;  Location: [facility] CV LAB;  Service: Cardiovascular;  Laterality: N/A;   LEFT HEART CATH AND CORONARY ANGIOGRAPHY N/A 05/19/2020   Procedure: LEFT HEART CATH AND CORONARY ANGIOGRAPHY;  Surgeon: Claudene Victory ORN, MD;  Location: [facility] CV [facility];  Service: Cardiovascular;  Laterality: N/A;   TOTAL HIP ARTHROPLASTY Left 09/10/2012   Procedure: REMOVAL OF HARDWARE LEFT PROXIMAL THIGH/FEMUR AND LEFT TOTAL HIP ARTHROPLASTY;  Surgeon: Lynwood FORBES Better, MD;  Location: [facility] OR;  Service: Orthopedics;  Laterality: Left;   TOTAL KNEE ARTHROPLASTY  01/2008   left knee    Current Outpatient Medications  Medication Sig Dispense Refill   atorvastatin  (LIPITOR ) 80 MG tablet Take 1 tablet (80 mg total) by mouth at bedtime. 90 tablet 3   carboxymethylcellulose (REFRESH PLUS) 0.5 % SOLN Place 1 drop into both eyes 3 (three) times daily as needed (dry eyes).  carvedilol  (COREG ) 6.25 MG tablet Take 1 tablet (6.25 mg total) by mouth 2 (two) times daily. 180 tablet 3   clopidogrel  (PLAVIX ) 75 MG tablet Take 1 tablet (75 mg total) by mouth daily. 90 tablet 3   cyclobenzaprine  (FLEXERIL ) 5 MG tablet Take 5 mg by mouth as needed for muscle spasms.     diclofenac  sodium (VOLTAREN ) 1 % GEL Apply 4 g topically 3 (three) times daily as needed (pain). 350 g 0   docusate sodium  (COLACE) 100 MG capsule Take 1 capsule (100 mg total) by mouth 2 (two) times daily. 28 capsule 0   empagliflozin  (JARDIANCE ) 25 MG TABS tablet Take 1/2 tablet daily 45 tablet 3   ezetimibe  (ZETIA ) 10 MG tablet Take 1 tablet (10 mg total) by mouth  daily. 90 tablet 3   famotidine (PEPCID) 40 MG tablet Take 40 mg by mouth daily as needed for heartburn or indigestion.     furosemide  (LASIX ) 20 MG tablet Take 1 tablet (20 mg total) by mouth daily. 90 tablet 3   gabapentin  (NEURONTIN ) 400 MG capsule Take 400-800 mg by mouth 4 (four) times daily.     icosapent  Ethyl (VASCEPA ) 1 g capsule Take 2 capsules (2 g total) by mouth 2 (two) times daily. 360 capsule 3   insulin  aspart (NOVOLOG ) 100 UNIT/ML injection Inject 10 Units into the skin 3 (three) times daily before meals. Sliding scale     insulin  glargine (LANTUS ) 100 UNIT/ML injection Inject 80 Units into the skin at bedtime.     loratadine (CLARITIN) 10 MG tablet Take 10 mg by mouth daily.     mexiletine (MEXITIL) 150 MG capsule Take 1 capsule (150 mg total) by mouth 2 (two) times daily. 180 capsule 3   Multiple Vitamins-Minerals (MULTIVITAMIN WITH MINERALS) tablet Take 1 tablet by mouth daily.     nitroGLYCERIN  (NITROSTAT ) 0.4 MG SL tablet Place 1 tablet (0.4 mg total) under the tongue every 5 (five) minutes x 3 doses as needed for chest pain. Call MD if you need two or more doses 30 tablet 12   oxyCODONE -acetaminophen  (PERCOCET) 10-325 MG tablet Take 1 tablet by mouth as needed for pain.     oxyCODONE -acetaminophen  (PERCOCET/ROXICET) 5-325 MG tablet Take 1 tablet by mouth every 6 (six) hours as needed for severe pain. 10 tablet 0   pantoprazole  (PROTONIX ) 40 MG tablet Take 40 mg by mouth 2 (two) times daily before a meal.     Polyethyl Glycol-Propyl Glycol (SYSTANE) 0.4-0.3 % GEL ophthalmic gel Place 1 application. into both eyes every 6 (six) hours as needed (dry eyes).     potassium chloride  (KLOR-CON  M) 10 MEQ tablet Take 1 tablet (10 mEq total) by mouth daily. 90 tablet 3   sacubitril-valsartan (ENTRESTO ) 24-26 MG Take 1 tablet by mouth 2 (two) times daily. 180 tablet 3   Semaglutide, 2 MG/DOSE, 8 MG/3ML SOPN Inject 2 mg into the skin once a week.     No current facility-administered  medications for this visit.    Allergies:   Naproxen, Toradol [ketorolac tromethamine], Codeine, and Morphine  and codeine   Social History:  The patient  reports that he quit smoking about 10 years ago. His smoking use included cigarettes. He started smoking about 52 years ago. He has a 63 pack-year smoking history. He has quit using smokeless tobacco.  His smokeless tobacco use included chew. He reports that he does not drink alcohol  and does not use drugs.   Family History:  The patient's family history includes  Alcohol  abuse in his brother; Cancer in his mother; Colon polyps in his brother; Crohn's disease in his father.  ROS:  Please see the history of present illness.    All other systems are reviewed and otherwise negative.   PHYSICAL EXAM:  VS:  There were no vitals taken for this visit. BMI: There is no height or weight on file to calculate BMI. Well nourished, well developed, in no acute distress HEENT: normocephalic, atraumatic Neck: no JVD, carotid bruits or masses Cardiac: *** RRR; no significant murmurs, no rubs, or gallops Lungs: *** CTA b/l, no wheezing, rhonchi or rales Abd: soft, nontender MS: no deformity or atrophy Ext: trace edema Skin: warm and dry, no rash Neuro: *** No gross deficits appreciated Psych: euthymic mood, full affect  *** ICD site is stable, no tethering or discomfort   EKG:  not done today  Device interrogation done today and reviewed by myself:  *** Battery and lead measurements are good ***No arrhythmias ***VP *** B>AX programmed where needed   02/26/2021: TTE 1. Left ventricular ejection fraction, by estimation, is 60 to 65%. The  left ventricle has normal function. The left ventricle has no regional  wall motion abnormalities. Left ventricular diastolic parameters are  consistent with Grade I diastolic  dysfunction (impaired relaxation).   2. Right ventricular systolic function is normal. The right ventricular  size is normal.   3.  The mitral valve is normal in structure. No evidence of mitral valve  regurgitation. No evidence of mitral stenosis.   4. The aortic valve is normal in structure. Aortic valve regurgitation is  not visualized. No aortic stenosis is present.   5. The inferior vena cava is normal in size with greater than 50%  respiratory variability, suggesting right atrial pressure of 3 mmHg.   Comparison(s): No significant change from prior study. Prior images  reviewed side by side.    05/19/2020; LHC 100% occlusion of the mid RCA, likely representing a chronic total occlusion given absence of thrombus staining in the area of occlusion and well-formed left-to-right collaterals. Widely patent left main Widely patent LAD Widely patent circumflex LVEDP 14 initially and following angiography and IV fluid 20 mmHg   RECOMMENDATIONS:   IV dobutamine  to improve potential RV dysfunction. IV fluids as tolerated A central line will be placed in the unit to measure co-oximetry and get right heart filling pressures. Continue Levophed , epinephrine , IV fentanyl , and intermittent Versed  as needed.  Wean Levophed  and epinephrine  as tolerated. Advanced heart failure team will help manage. Critical care team, Dr. Brutus managing primarily.   05/20/2020: TTE 1. Left ventricular ejection fraction, by estimation, is 40 to 45%. The  left ventricle has mildly decreased function. The left ventricle  demonstrates regional wall motion abnormalities (basal and mid  inferolateral suggestive of RCA disease). There is  mild concentric left ventricular hypertrophy. Left ventricular diastolic  parameters are indeterminate.   2. Right ventricular systolic function is hyperdynamic. The right  ventricular size is normal.   3. A small pericardial effusion is present.   4. The mitral valve is grossly normal. No evidence of mitral valve  regurgitation.   5. The aortic valve was not well visualized. Aortic valve regurgitation  is  not visualized.   6. Aortic dilatation noted. There is borderline dilatation of the aortic  root, measuring 38 mm.   7. The inferior vena cava is dilated in size with <50% respiratory  variability, suggesting right atrial pressure of 15 mmHg.   Comparison(s): No  prior Echocardiogram.   Recent Labs: 02/01/2023: B Natriuretic Peptide 7.3; BUN 17; Creatinine, Ser 0.97; Potassium 4.2; Sodium 138  03/30/2023: Cholesterol 109; HDL 34; LDL Cholesterol 53; Total CHOL/HDL Ratio 3.2; Triglycerides 110; VLDL 22   CrCl cannot be calculated (Patient's most recent lab result is older than the maximum 21 days allowed.).   Wt Readings from Last 3 Encounters:  02/01/23 295 lb 6.4 oz (134 kg)  09/08/22 298 lb 3.2 oz (135.3 kg)  05/06/22 297 lb 2 oz (134.8 kg)     Other studies reviewed: Additional studies/records reviewed today include: summarized above  ASSESSMENT AND PLAN:  ICD *** Intact function *** B>AX where needed  C.arrest Off AAD prior to his discharge *** No arrhythmias  CAD *** No CP C/w with Dr. Marciano  ICM Chronic CHF (recovered LVEF diastolic) *** BP limits GDMT C/w Dr. Marciano    Disposition: F/u with remotes as usual, in clinic in ***, sooner if needed  Current medicines are reviewed at length with the patient today.  The patient did not have any concerns regarding medicines.  Bonney Charlies Arthur, PA-C 09/06/2023 1:01 PM     [facility] [address] [place] [address] [phone] (office)  [phone] (fax)

## 2023-09-07 ENCOUNTER — Ambulatory Visit: Attending: Physician Assistant | Admitting: Physician Assistant

## 2023-09-07 VITALS — BP 108/62 | HR 84 | Ht 72.0 in | Wt 292.0 lb

## 2023-09-07 DIAGNOSIS — I493 Ventricular premature depolarization: Secondary | ICD-10-CM

## 2023-09-07 DIAGNOSIS — I469 Cardiac arrest, cause unspecified: Secondary | ICD-10-CM

## 2023-09-07 DIAGNOSIS — I2510 Atherosclerotic heart disease of native coronary artery without angina pectoris: Secondary | ICD-10-CM | POA: Diagnosis not present

## 2023-09-07 DIAGNOSIS — Z95810 Presence of automatic (implantable) cardiac defibrillator: Secondary | ICD-10-CM

## 2023-09-07 DIAGNOSIS — I255 Ischemic cardiomyopathy: Secondary | ICD-10-CM

## 2023-09-07 LAB — CUP PACEART INCLINIC DEVICE CHECK
Battery Remaining Longevity: 96 mo
Battery Voltage: 3 V
Brady Statistic AP VP Percent: 0.02 %
Brady Statistic AP VS Percent: 7.2 %
Brady Statistic AS VP Percent: 0.05 %
Brady Statistic AS VS Percent: 92.74 %
Brady Statistic RA Percent Paced: 7.18 %
Brady Statistic RV Percent Paced: 0.06 %
Date Time Interrogation Session: 20250904140006
HighPow Impedance: 101 Ohm
Implantable Lead Connection Status: 753985
Implantable Lead Connection Status: 753985
Implantable Lead Implant Date: 20220526
Implantable Lead Implant Date: 20220526
Implantable Lead Location: 753859
Implantable Lead Location: 753860
Implantable Lead Model: 5076
Implantable Pulse Generator Implant Date: 20220526
Lead Channel Impedance Value: 418 Ohm
Lead Channel Impedance Value: 456 Ohm
Lead Channel Impedance Value: 513 Ohm
Lead Channel Pacing Threshold Amplitude: 0.375 V
Lead Channel Pacing Threshold Amplitude: 0.625 V
Lead Channel Pacing Threshold Pulse Width: 0.4 ms
Lead Channel Pacing Threshold Pulse Width: 0.4 ms
Lead Channel Sensing Intrinsic Amplitude: 2.125 mV
Lead Channel Sensing Intrinsic Amplitude: 2.5 mV
Lead Channel Sensing Intrinsic Amplitude: 5.5 mV
Lead Channel Sensing Intrinsic Amplitude: 5.875 mV
Lead Channel Setting Pacing Amplitude: 1.5 V
Lead Channel Setting Pacing Amplitude: 2 V
Lead Channel Setting Pacing Pulse Width: 0.4 ms
Lead Channel Setting Sensing Sensitivity: 0.3 mV
Zone Setting Status: 755011
Zone Setting Status: 755011

## 2023-09-07 LAB — CUP PACEART REMOTE DEVICE CHECK
Battery Remaining Longevity: 96 mo
Battery Voltage: 3 V
Brady Statistic AP VP Percent: 0.03 %
Brady Statistic AP VS Percent: 8.54 %
Brady Statistic AS VP Percent: 0.04 %
Brady Statistic AS VS Percent: 91.39 %
Brady Statistic RA Percent Paced: 8.48 %
Brady Statistic RV Percent Paced: 0.07 %
Date Time Interrogation Session: 20250902001705
HighPow Impedance: 100 Ohm
Implantable Lead Connection Status: 753985
Implantable Lead Connection Status: 753985
Implantable Lead Implant Date: 20220526
Implantable Lead Implant Date: 20220526
Implantable Lead Location: 753859
Implantable Lead Location: 753860
Implantable Lead Model: 5076
Implantable Pulse Generator Implant Date: 20220526
Lead Channel Impedance Value: 418 Ohm
Lead Channel Impedance Value: 456 Ohm
Lead Channel Impedance Value: 513 Ohm
Lead Channel Pacing Threshold Amplitude: 0.375 V
Lead Channel Pacing Threshold Amplitude: 0.625 V
Lead Channel Pacing Threshold Pulse Width: 0.4 ms
Lead Channel Pacing Threshold Pulse Width: 0.4 ms
Lead Channel Sensing Intrinsic Amplitude: 2.25 mV
Lead Channel Sensing Intrinsic Amplitude: 2.25 mV
Lead Channel Sensing Intrinsic Amplitude: 5.375 mV
Lead Channel Sensing Intrinsic Amplitude: 5.375 mV
Lead Channel Setting Pacing Amplitude: 1.5 V
Lead Channel Setting Pacing Amplitude: 2 V
Lead Channel Setting Pacing Pulse Width: 0.4 ms
Lead Channel Setting Sensing Sensitivity: 0.3 mV
Zone Setting Status: 755011
Zone Setting Status: 755011

## 2023-09-07 NOTE — Patient Instructions (Signed)
 Medication Instructions:   Your physician recommends that you continue on your current medications as directed. Please refer to the Current Medication list given to you today.   *If you need a refill on your cardiac medications before your next appointment, please call your pharmacy*    Lab Work:  NONE ORDERED  TODAY     If you have labs (blood work) drawn today and your tests are completely normal, you will receive your results only by: MyChart Message (if you have MyChart) OR A paper copy in the mail If you have any lab test that is abnormal or we need to change your treatment, we will call you to review the results.    Testing/Procedures: NONE ORDERED  TODAY     Follow-Up: At [facility], you and your health needs are our priority.  As part of our continuing mission to provide you with exceptional heart care, our providers are all part of one team.  This team includes your primary Cardiologist (physician) and Advanced Practice Providers or APPs (Physician Assistants and Nurse Practitioners) who all work together to provide you with the care you need, when you need it.   Your next appointment:  NEXT AVAILABLE WITH DR [facility] / APP                                   AND     1 year(s)   Provider:     You may see OLE ONEIDA HOLTS, MD or : Charlies Arthur, PA-C    We recommend signing up for the patient portal called MyChart.  Sign up information is provided on this After Visit Summary.  MyChart is used to connect with patients for Virtual Visits (Telemedicine).  Patients are able to view lab/test results, encounter notes, upcoming appointments, etc.  Non-urgent messages can be sent to your provider as well.   To learn more about what you can do with MyChart, go to [email or web address].   Other Instructions

## 2023-09-09 ENCOUNTER — Ambulatory Visit: Payer: Self-pay | Admitting: Cardiology

## 2023-09-11 NOTE — Telephone Encounter (Signed)
 Pt seen in clinic 09/07/2023.  Shock vectors reprogrammed.

## 2023-09-12 NOTE — Progress Notes (Signed)
Remote ICD Transmission.

## 2023-11-02 ENCOUNTER — Other Ambulatory Visit (HOSPITAL_COMMUNITY): Payer: Self-pay

## 2023-11-02 DIAGNOSIS — I255 Ischemic cardiomyopathy: Secondary | ICD-10-CM

## 2023-11-06 ENCOUNTER — Encounter (HOSPITAL_COMMUNITY): Payer: Self-pay | Admitting: Cardiology

## 2023-11-06 ENCOUNTER — Other Ambulatory Visit (HOSPITAL_COMMUNITY): Payer: Self-pay

## 2023-11-06 ENCOUNTER — Ambulatory Visit (HOSPITAL_COMMUNITY)
Admission: RE | Admit: 2023-11-06 | Discharge: 2023-11-06 | Disposition: A | Source: Ambulatory Visit | Attending: Cardiology | Admitting: Cardiology

## 2023-11-06 VITALS — BP 104/58 | HR 74 | Wt 294.6 lb

## 2023-11-06 DIAGNOSIS — I255 Ischemic cardiomyopathy: Secondary | ICD-10-CM | POA: Insufficient documentation

## 2023-11-06 DIAGNOSIS — E669 Obesity, unspecified: Secondary | ICD-10-CM | POA: Insufficient documentation

## 2023-11-06 DIAGNOSIS — Z95810 Presence of automatic (implantable) cardiac defibrillator: Secondary | ICD-10-CM | POA: Insufficient documentation

## 2023-11-06 DIAGNOSIS — Z87891 Personal history of nicotine dependence: Secondary | ICD-10-CM | POA: Diagnosis not present

## 2023-11-06 DIAGNOSIS — Z79899 Other long term (current) drug therapy: Secondary | ICD-10-CM | POA: Insufficient documentation

## 2023-11-06 DIAGNOSIS — Z7984 Long term (current) use of oral hypoglycemic drugs: Secondary | ICD-10-CM | POA: Diagnosis not present

## 2023-11-06 DIAGNOSIS — E119 Type 2 diabetes mellitus without complications: Secondary | ICD-10-CM | POA: Insufficient documentation

## 2023-11-06 DIAGNOSIS — G4733 Obstructive sleep apnea (adult) (pediatric): Secondary | ICD-10-CM | POA: Insufficient documentation

## 2023-11-06 DIAGNOSIS — I2510 Atherosclerotic heart disease of native coronary artery without angina pectoris: Secondary | ICD-10-CM | POA: Diagnosis not present

## 2023-11-06 DIAGNOSIS — M5459 Other low back pain: Secondary | ICD-10-CM | POA: Diagnosis not present

## 2023-11-06 DIAGNOSIS — Z6839 Body mass index (BMI) 39.0-39.9, adult: Secondary | ICD-10-CM | POA: Insufficient documentation

## 2023-11-06 DIAGNOSIS — I5022 Chronic systolic (congestive) heart failure: Secondary | ICD-10-CM | POA: Diagnosis not present

## 2023-11-06 DIAGNOSIS — Z794 Long term (current) use of insulin: Secondary | ICD-10-CM | POA: Diagnosis not present

## 2023-11-06 DIAGNOSIS — R9431 Abnormal electrocardiogram [ECG] [EKG]: Secondary | ICD-10-CM | POA: Diagnosis not present

## 2023-11-06 DIAGNOSIS — Z8674 Personal history of sudden cardiac arrest: Secondary | ICD-10-CM | POA: Diagnosis not present

## 2023-11-06 DIAGNOSIS — E785 Hyperlipidemia, unspecified: Secondary | ICD-10-CM | POA: Insufficient documentation

## 2023-11-06 DIAGNOSIS — I5032 Chronic diastolic (congestive) heart failure: Secondary | ICD-10-CM | POA: Diagnosis not present

## 2023-11-06 DIAGNOSIS — Z7902 Long term (current) use of antithrombotics/antiplatelets: Secondary | ICD-10-CM | POA: Diagnosis not present

## 2023-11-06 DIAGNOSIS — I110 Hypertensive heart disease with heart failure: Secondary | ICD-10-CM | POA: Insufficient documentation

## 2023-11-06 DIAGNOSIS — I493 Ventricular premature depolarization: Secondary | ICD-10-CM | POA: Diagnosis not present

## 2023-11-06 LAB — BASIC METABOLIC PANEL WITH GFR
Anion gap: 11 (ref 5–15)
BUN: 18 mg/dL (ref 8–23)
CO2: 22 mmol/L (ref 22–32)
Calcium: 8.7 mg/dL — ABNORMAL LOW (ref 8.9–10.3)
Chloride: 102 mmol/L (ref 98–111)
Creatinine, Ser: 0.93 mg/dL (ref 0.61–1.24)
GFR, Estimated: >60 mL/min (ref >60)
Glucose, Bld: 166 mg/dL — ABNORMAL HIGH (ref 70–99)
Potassium: 4 mmol/L (ref 3.5–5.1)
Sodium: 135 mmol/L (ref 135–145)

## 2023-11-06 LAB — BRAIN NATRIURETIC PEPTIDE: B Natriuretic Peptide: 22.2 pg/mL (ref 0.0–100.0)

## 2023-11-06 MED ORDER — KERENDIA 10 MG PO TABS
10.0000 mg | ORAL_TABLET | Freq: Every day | ORAL | 3 refills | Status: DC
Start: 2023-11-06 — End: 2023-11-13

## 2023-11-06 MED ORDER — PERFLUTREN LIPID MICROSPHERE
1.0000 mL | INTRAVENOUS | Status: DC | PRN
  Administered 2023-11-06: 6 mL via INTRAVENOUS

## 2023-11-06 NOTE — Progress Notes (Signed)
  Echocardiogram 2D Echocardiogram has been performed.  Koleen KANDICE Popper, RDCS 11/06/2023, 2:48 PM

## 2023-11-06 NOTE — Patient Instructions (Addendum)
 START Kerendia 10 mg daily.  Labs done today, your results will be available in MyChart, we will contact you for abnormal readings.  Blood work 2 weeks after starting your Kerendia at [facility].  Your physician recommends that you schedule a follow-up appointment in: 3 months.  If you have any questions or concerns before your next appointment please send us  a message through [place] or call our office at [phone].    TO LEAVE A MESSAGE FOR THE NURSE SELECT OPTION 2, PLEASE LEAVE A MESSAGE INCLUDING: YOUR NAME DATE OF BIRTH CALL BACK NUMBER REASON FOR CALL**this is important as we prioritize the call backs  YOU WILL RECEIVE A CALL BACK THE SAME DAY AS LONG AS YOU CALL BEFORE 4:00 PM  At the [facility], you and your health needs are our priority. As part of our continuing mission to provide you with exceptional heart care, we have created designated Provider Care Teams. These Care Teams include your primary Cardiologist (physician) and Advanced Practice Providers (APPs- Physician Assistants and Nurse Practitioners) who all work together to provide you with the care you need, when you need it.   You may see any of the following providers on your designated Care Team at your next follow up: Dr Toribio Fuel Dr Ezra Shuck Dr. Morene Brownie Greig Mosses, NP Caffie Shed, [place] [facility] [place], [place] Beckey Coe, NP Jordan Lee, NP Ellouise Class, NP Tinnie Redman, PharmD Jaun Bash, PharmD   Please be sure to bring in all your medications bottles to every appointment.    Thank you for choosing [facility]

## 2023-11-07 ENCOUNTER — Ambulatory Visit (HOSPITAL_COMMUNITY): Payer: Self-pay | Admitting: Cardiology

## 2023-11-07 LAB — ECHOCARDIOGRAM COMPLETE
Area-P 1/2: 3.27 cm2
S' Lateral: 4 cm

## 2023-11-07 NOTE — Progress Notes (Signed)
 Advanced Heart Failure Clinic Note   PCP: [facility] EP: Dr. Cindie HF Cardiologist: Dr. Rolan  Chief Complaint: CHF  HPI: 68 y.o. male w/ HTN, DM2, HLD, VF arrest, and ischemic cardiomyopathy.    He suffered witnessed out of hospital cardiac arrest 05/19/20 w/ immediate bystander CPR, initiated by wife, and prompt activation of EMS. Per report, had prolonged CPR, more than 45 min prior to ROSC. Underlying rhythm was VFib. Bedside echo showed EF 40-50% inferobasal severe hypokinesis with septal bounce.  RV systolic function normal. 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 No other disease. LM, LAD and LCx all widely patent. No indication for PCI.  Suspect scar-mediated VF.  He required mechanical ventilation and pressor support, but did not require cardiac mechanical support. Hospitalization was further complicated by distributive shock, complete heart block (2/2 to amiodarone ), ATN and transaminitis. He received a Medtronic ICD for secondary prevention. He was discharged to CIR.   Echo in 2/23 showed EF 50-55% with inferior hypokinesis, normal RV, and normal IVC.   Zio 7 day (5/24) showed mostly NSR, 19% PVC burden. Coreg  increased and mexiletine 150 mg bid added.  Echo 6/24 showed EF 55-60%, G1DD, normal RV.  Zio monitor in 9/24 showed PVC burden down to 1.7% on mexiletine.   Echo was done today and reviewed, very technically difficult study with poor images, EF probably around 50% with mild LVH, IVC not dilated.   Today he returns for HF follow up. Weight down 1 lb.  Patient says that he feels run down.  Main complaints are chronic low back pain, should pain, knee pain. Breathing ok, short of breath with push mowing but not dyspeic walking on flat ground.  No chest pain.  No lightheadedness/syncope/palpitations. \    ECG (personally reviewed): NSR, old inferior MI, RBBB  Labs (5/24): LDL 66, TGs 200 Labs (6/24): K 4.6, creatinine 1.03 Labs (9/24): K 4.2, creatinine 0.97, BNP 11.2 Labs (1/25): BNP 7, K 4.2, creatinine 0.97 Labs (3/25): LDL 53  PMH: 1. Type 2 diabetes 2. GERD 3. HTN 4. Hyperlipidemia.  5. 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  - Medtronic ICD.  6. CAD: LHC 5/22 showed chronic occlusion of RCA with L=>R collaterals.  7. Transient complete heart block in 5/22.  8. Ischemic cardiomyopathy: Echo post-arrest in 5/22 showed EF 45%, repeat echo in 5/22 showed EF up to 60-65%.  - Echo (2/23): EF 50-55% with inferior hypokinesis, normal RV, and normal IVC.  - Echo (6/24): EF 50-55%, G1DD, normal RV, normal IVC. - Echo (10/25):  very technically difficult study with poor images, EF probably around 50% with mild LVH, IVC not dilated.  9. OSA: Uses CPAP.  10. PVC: 7-day Zio (5/24) with 19% PVC burden. Started mexelitine. - Zio monitor (9/24): 1.7% PVCs.   ROS: All systems negative except as listed in HPI, PMH and Problem List.  SH:  Social History   Socioeconomic History   Marital status: Married    Spouse name: Not on file   Number of children: Not on file   Years of education: Not on file   Highest education level: Not on file  Occupational History   Not on file  Tobacco Use   Smoking status: Former    Current packs/day: 0.00    Average packs/day: 1.5 packs/day for 42.0 years (63.0 ttl pk-yrs)  Types: Cigarettes    Start date: 09/04/1971    Quit date: 09/03/2013    Years since quitting: 10.1   Smokeless tobacco: Former    Types: Chew   Tobacco comments:    We discussed the need to remain smoke free and to call me for help if he has the desire to smoke again.  Vaping Use   Vaping status: Never Used  Substance and Sexual Activity   Alcohol  use: No    Alcohol /week: 0.0 standard drinks of alcohol     Comment: recovering alcoholic    Drug use:  No   Sexual activity: Not on file  Other Topics Concern   Not on file  Social History Narrative   Recovering alcoholic      Minister    Social Drivers of Health   Financial Resource Strain: Low Risk  (05/29/2020)   Overall Financial Resource Strain (CARDIA)    Difficulty of Paying Living Expenses: Not very hard  Food Insecurity: No Food Insecurity (05/29/2020)   Hunger Vital Sign    Worried About Running Out of Food in the Last Year: Never true    Ran Out of Food in the Last Year: Never true  Transportation Needs: No Transportation Needs (05/29/2020)   PRAPARE - Administrator, Civil Service (Medical): No    Lack of Transportation (Non-Medical): No  Physical Activity: Not on file  Stress: Not on file  Social Connections: Not on file  Intimate Partner Violence: Not on file   FH:  Family History  Problem Relation Age of Onset   Cancer Mother        lung   Crohn's disease Father    Alcohol  abuse Brother    Colon polyps Brother    Colon cancer Neg Hx    Current Outpatient Medications  Medication Sig Dispense Refill   atorvastatin  (LIPITOR ) 80 MG tablet Take 1 tablet (80 mg total) by mouth at bedtime. 90 tablet 3   carboxymethylcellulose (REFRESH PLUS) 0.5 % SOLN Place 1 drop into both eyes 3 (three) times daily as needed (dry eyes).     carvedilol  (COREG ) 6.25 MG tablet Take 1 tablet (6.25 mg total) by mouth 2 (two) times daily. 180 tablet 3   clopidogrel  (PLAVIX ) 75 MG tablet Take 1 tablet (75 mg total) by mouth daily. 90 tablet 3   cyclobenzaprine  (FLEXERIL ) 5 MG tablet Take 5 mg by mouth as needed for muscle spasms.     diclofenac  sodium (VOLTAREN ) 1 % GEL Apply 4 g topically 3 (three) times daily as needed (pain). 350 g 0   docusate sodium  (COLACE) 100 MG capsule Take 1 capsule (100 mg total) by mouth 2 (two) times daily. 28 capsule 0   empagliflozin  (JARDIANCE ) 25 MG TABS tablet Take 1/2 tablet daily 45 tablet 3   ezetimibe  (ZETIA ) 10 MG tablet Take 1 tablet  (10 mg total) by mouth daily. 90 tablet 3   famotidine (PEPCID) 40 MG tablet Take 40 mg by mouth daily as needed for heartburn or indigestion.     Finerenone (KERENDIA) 10 MG TABS Take 1 tablet (10 mg total) by mouth daily. 90 tablet 3   furosemide  (LASIX ) 20 MG tablet Take 1 tablet (20 mg total) by mouth daily. 90 tablet 3   gabapentin  (NEURONTIN ) 400 MG capsule Take 400-800 mg by mouth 4 (four) times daily.     icosapent  Ethyl (VASCEPA ) 1 g capsule Take 2 capsules (2 g total) by mouth 2 (two) times daily. 360 capsule 3  insulin  aspart (NOVOLOG ) 100 UNIT/ML injection Inject 10 Units into the skin 3 (three) times daily before meals. Sliding scale     insulin  glargine (LANTUS ) 100 UNIT/ML injection Inject 80 Units into the skin at bedtime.     loratadine (CLARITIN) 10 MG tablet Take 10 mg by mouth daily.     mexiletine (MEXITIL) 150 MG capsule Take 1 capsule (150 mg total) by mouth 2 (two) times daily. 180 capsule 3   Multiple Vitamins-Minerals (MULTIVITAMIN WITH MINERALS) tablet Take 1 tablet by mouth daily.     nitroGLYCERIN  (NITROSTAT ) 0.4 MG SL tablet Place 1 tablet (0.4 mg total) under the tongue every 5 (five) minutes x 3 doses as needed for chest pain. Call MD if you need two or more doses 30 tablet 12   oxyCODONE -acetaminophen  (PERCOCET) 10-325 MG tablet Take 1 tablet by mouth as needed for pain.     oxyCODONE -acetaminophen  (PERCOCET/ROXICET) 5-325 MG tablet Take 1 tablet by mouth every 6 (six) hours as needed for severe pain. 10 tablet 0   pantoprazole  (PROTONIX ) 40 MG tablet Take 40 mg by mouth 2 (two) times daily before a meal.     Polyethyl Glycol-Propyl Glycol (SYSTANE) 0.4-0.3 % GEL ophthalmic gel Place 1 application. into both eyes every 6 (six) hours as needed (dry eyes).     potassium chloride  (KLOR-CON  M) 10 MEQ tablet Take 1 tablet (10 mEq total) by mouth daily. 90 tablet 3   sacubitril-valsartan (ENTRESTO ) 24-26 MG Take 1 tablet by mouth 2 (two) times daily. 180 tablet 3    tirzepatide (ZEPBOUND) 10 MG/0.5ML Pen Inject 10 mg into the skin once a week.     No current facility-administered medications for this encounter.   BP (!) 104/58   Pulse 74   Wt 133.6 kg (294 lb 9.6 oz)   SpO2 96%   BMI 39.95 kg/m   Wt Readings from Last 3 Encounters:  11/06/23 133.6 kg (294 lb 9.6 oz)  09/07/23 132.5 kg (292 lb)  02/01/23 134 kg (295 lb 6.4 oz)   PHYSICAL EXAM: General: NAD, obese.  Neck: JVP ?8 cm, no thyromegaly or thyroid  nodule.  Lungs: Clear to auscultation bilaterally with normal respiratory effort. CV: Nondisplaced PMI.  Heart regular S1/S2, no S3/S4, no murmur.  No peripheral edema.  No carotid bruit.  Normal pedal pulses.  Abdomen: Soft, nontender, no hepatosplenomegaly, no distention.  Skin: Intact without lesions or rashes.  Neurologic: Alert and oriented x 3.  Psych: Normal affect. Extremities: No clubbing or cyanosis.  HEENT: Normal.   ASSESSMENT & PLAN:  1. Chronic diastolic CHF: Ischemic cardiomyopathy, EF in the 45% range on initial echo in 5/22 but back up to 60-65% on repeat limited echo also in 5/22.  Echo in 2/23 showed EF 50-55% with inferior hypokinesis.  Echo 6/24 showed EF 55-60%, G1DD, normal RV. Echo today very technically difficult study with poor images, EF probably around 50% with mild LVH, IVC not dilated. NYHA class II, seems to have mostly orthopedic limitations.  Probably not significantly volume overloaded.   - Continue Lasix  20 mg daily - Add finerenone 10 mg daily with BMET/BNP today and BMET in 10 days.   - Continue Coreg  6.25 mg bid.  - Continue Entresto  24/26 mg bid.  - Continue Jardiance .  2. CAD: LHC in 5/22 showed CTO of the mid RCA with good collaterals.  Do not think this was acute MI from plaque rupture.   - Continue Plavix  long-term, so no ASA.  - Continue statin/Vascepa , good  lipids in 3/25.  3. Cardiac arrest: In 5/22. Suspect scar-mediated from old RCA occlusion. EF now low normal to mildly reduced.  Medtronic  ICD for secondary prevention.   4. Diabetes: Per PCP.  5. 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  No further HB. 6. HTN: BP controlled. - GDMT as above. 7. Obesity:  Body mass index is 39.95 kg/m. - Continue tirzepatide.  8. PVCs: Patient noted to have PVCs and palpitations. 7 day Zio monitor (6/24) showed 19% PVC burden. Repeat Zio monitor in 9/24 on mexiletine showed 1.7% PVCs.  - Continue Coreg  - Continue mexiletine 150 mg bid 9. OSA: Continue CPAP.                                                                                                                                                                                                                                                                                                                                          I spent 32 minutes reviewing records, interviewing/examining patient, and managing orders.    Ezra Shuck  11/07/2023

## 2023-11-13 ENCOUNTER — Telehealth (HOSPITAL_COMMUNITY): Payer: Self-pay | Admitting: Cardiology

## 2023-11-13 MED ORDER — KERENDIA 10 MG PO TABS: 10.0000 mg | ORAL_TABLET | Freq: Every day | ORAL | 3 refills | Status: DC

## 2023-11-13 NOTE — Telephone Encounter (Signed)
 [facility] called to report [name] is not on formulary  Pt aware Will send to local pharmacy

## 2023-11-23 ENCOUNTER — Encounter: Payer: Self-pay | Admitting: Cardiology

## 2023-11-28 ENCOUNTER — Telehealth (HOSPITAL_COMMUNITY): Payer: Self-pay

## 2023-11-29 ENCOUNTER — Other Ambulatory Visit (HOSPITAL_COMMUNITY): Payer: Self-pay

## 2023-11-29 ENCOUNTER — Telehealth (HOSPITAL_COMMUNITY): Payer: Self-pay | Admitting: Cardiology

## 2023-11-29 DIAGNOSIS — I5022 Chronic systolic (congestive) heart failure: Secondary | ICD-10-CM

## 2023-11-29 NOTE — Telephone Encounter (Signed)
 Left message to call back.

## 2023-11-29 NOTE — Telephone Encounter (Signed)
 He can start spironolactone 12.5 mg daily with BMET in 10 days.

## 2023-11-29 NOTE — Telephone Encounter (Signed)
 Advanced Heart Failure Patient Advocate Encounter  Received call to check into prior authorization started for Kerendia  (Key [id number]). Review of patient chart shows no active coverage, and patient has had medications filled with the [place]. No prior authorization submitted at this time.  Rachel DEL, CPhT Rx Patient Advocate Phone: [phone]

## 2023-11-29 NOTE — Telephone Encounter (Signed)
 Patient unable to get new medication kerendia  from [place] pharmacy-not on formulary and they will not order   Unable to fill with local pharmacy co pay $1400  Pt would like to know if provider would prescribe something else or should he just mot worry about it

## 2023-12-04 MED ORDER — SPIRONOLACTONE 25 MG PO TABS: 12.5000 mg | ORAL_TABLET | Freq: Every day | ORAL | 3 refills | Status: DC

## 2023-12-04 NOTE — Addendum Note (Signed)
 Addended by: JERONA DALTON HERO on: 12/04/2023 03:47 PM   Modules accepted: Orders

## 2023-12-04 NOTE — Telephone Encounter (Signed)
 Patient aware Reports he will call back once medication is received to arrange follow up lab appt

## 2023-12-05 ENCOUNTER — Ambulatory Visit

## 2023-12-05 DIAGNOSIS — I5022 Chronic systolic (congestive) heart failure: Secondary | ICD-10-CM

## 2023-12-06 LAB — CUP PACEART REMOTE DEVICE CHECK
Battery Remaining Longevity: 92 mo
Battery Voltage: 3 V
Brady Statistic AP VP Percent: 0.01 %
Brady Statistic AP VS Percent: 5.94 %
Brady Statistic AS VP Percent: 0.03 %
Brady Statistic AS VS Percent: 94.01 %
Brady Statistic RA Percent Paced: 5.92 %
Brady Statistic RV Percent Paced: 0.04 %
Date Time Interrogation Session: 20251202001603
HighPow Impedance: 99 Ohm
Implantable Lead Connection Status: 753985
Implantable Lead Connection Status: 753985
Implantable Lead Implant Date: 20220526
Implantable Lead Implant Date: 20220526
Implantable Lead Location: 753859
Implantable Lead Location: 753860
Implantable Lead Model: 5076
Implantable Pulse Generator Implant Date: 20220526
Lead Channel Impedance Value: 399 Ohm
Lead Channel Impedance Value: 418 Ohm
Lead Channel Impedance Value: 475 Ohm
Lead Channel Pacing Threshold Amplitude: 0.375 V
Lead Channel Pacing Threshold Amplitude: 0.625 V
Lead Channel Pacing Threshold Pulse Width: 0.4 ms
Lead Channel Pacing Threshold Pulse Width: 0.4 ms
Lead Channel Sensing Intrinsic Amplitude: 2.25 mV
Lead Channel Sensing Intrinsic Amplitude: 2.25 mV
Lead Channel Sensing Intrinsic Amplitude: 6.125 mV
Lead Channel Sensing Intrinsic Amplitude: 6.125 mV
Lead Channel Setting Pacing Amplitude: 1.5 V
Lead Channel Setting Pacing Amplitude: 2 V
Lead Channel Setting Pacing Pulse Width: 0.4 ms
Lead Channel Setting Sensing Sensitivity: 0.3 mV
Zone Setting Status: 755011
Zone Setting Status: 755011

## 2023-12-07 ENCOUNTER — Ambulatory Visit: Payer: Self-pay | Admitting: Cardiovascular Disease

## 2023-12-08 NOTE — Progress Notes (Signed)
 Remote ICD Transmission

## 2023-12-15 ENCOUNTER — Other Ambulatory Visit (HOSPITAL_COMMUNITY): Payer: Self-pay

## 2023-12-18 ENCOUNTER — Telehealth (HOSPITAL_COMMUNITY): Payer: Self-pay | Admitting: Cardiology

## 2023-12-18 NOTE — Telephone Encounter (Signed)
 Printed and faxed to number provided

## 2023-12-18 NOTE — Telephone Encounter (Addendum)
°  ADVANCED HEART FAILURE CLINIC   Pre-operative Risk Assessment   HEARTCARE STAFF-IMPORTANT INSTRUCTIONS 1 Red and Blue Text will auto delete once note is signed or closed. 2 Press F2 to navigate through template.   3 On drop down lists, L click to select >> R click to activate next field 4 Reason for Visit format is IMPORTANT!!  See Directions on No. 2 below. 5 Please review chart to determine if there is already a clearance note open for this procedure!!  DO NOT duplicate if a note already exists!!    :1}      Request for Surgical Clearance    Procedure:  cervical epidural steriod injection  Date of Surgery:  Clearance TBD                                 Surgeon:  Dr Dyanne Socks Group or Practice Name:  [facility] Phone number:  [phone] Fax number:  [phone]   Type of Clearance Requested:   - Pharmacy:  Hold Clopidogrel  (Plavix ) 7 days prior and restart 24 hours after with no surgical complications    Type of Anesthesia:  Not Indicated   Additional requests/questions:    [name]   12/18/2023, 4:01 PM   Advanced Heart Failure Clinic [name]  (enter provider) [facility] [address] [facility] [place] [address] [phone] (office) [phone] (fax)

## 2023-12-26 ENCOUNTER — Telehealth (HOSPITAL_COMMUNITY): Payer: Self-pay

## 2023-12-26 MED ORDER — MEXILETINE HCL 150 MG PO CAPS: 150.0000 mg | ORAL_CAPSULE | Freq: Two times a day (BID) | ORAL | 2 refills | Status: DC

## 2023-12-26 NOTE — Telephone Encounter (Signed)
 Patient called in requesting refill of Mexilitiene to be sent to [facility] pharmacy in [place].   Per Dr. Orvilla last OV note Continue mexiletine 150 mg bid  Refill sent to patient preferred pharmacy.   Attempted to call patient to make him aware but unable to reach. Left message stating that this has been sent in and call with issues.

## 2024-02-06 ENCOUNTER — Other Ambulatory Visit (HOSPITAL_COMMUNITY): Payer: Self-pay

## 2024-02-06 ENCOUNTER — Ambulatory Visit (HOSPITAL_COMMUNITY)

## 2024-02-06 DIAGNOSIS — I5032 Chronic diastolic (congestive) heart failure: Secondary | ICD-10-CM

## 2024-02-06 MED ORDER — SPIRONOLACTONE 25 MG PO TABS: 12.5000 mg | ORAL_TABLET | Freq: Every day | ORAL | 3 refills | Status: AC

## 2024-02-06 MED ORDER — MEXILETINE HCL 150 MG PO CAPS: 150.0000 mg | ORAL_CAPSULE | Freq: Two times a day (BID) | ORAL | 2 refills | Status: AC

## 2024-02-06 MED ORDER — EZETIMIBE 10 MG PO TABS: 10.0000 mg | ORAL_TABLET | Freq: Every day | ORAL | 3 refills | Status: AC

## 2024-02-06 MED ORDER — CLOPIDOGREL BISULFATE 75 MG PO TABS: 75.0000 mg | ORAL_TABLET | Freq: Every day | ORAL | 3 refills | Status: AC

## 2024-02-06 MED ORDER — FUROSEMIDE 20 MG PO TABS: 20.0000 mg | ORAL_TABLET | Freq: Every day | ORAL | 3 refills | Status: AC

## 2024-02-06 MED ORDER — ATORVASTATIN CALCIUM 80 MG PO TABS: 80.0000 mg | ORAL_TABLET | Freq: Every day | ORAL | 3 refills | Status: AC

## 2024-02-06 MED ORDER — POTASSIUM CHLORIDE CRYS ER 10 MEQ PO TBCR: 10.0000 meq | EXTENDED_RELEASE_TABLET | Freq: Every day | ORAL | 3 refills | Status: AC

## 2024-02-06 MED ORDER — CARVEDILOL 6.25 MG PO TABS: 6.2500 mg | ORAL_TABLET | Freq: Two times a day (BID) | ORAL | 3 refills | Status: AC

## 2024-02-20 ENCOUNTER — Ambulatory Visit (HOSPITAL_COMMUNITY)

## 2024-03-05 ENCOUNTER — Ambulatory Visit

## 2024-06-04 ENCOUNTER — Encounter
